# Patient Record
Sex: Male | Born: 1935 | Race: White | Hispanic: No | Marital: Married | Smoking: Former smoker
Health system: Southern US, Community
[De-identification: ages and names within clinical notes are randomized; demographics above are authoritative.]

## PROBLEM LIST (undated history)

## (undated) DIAGNOSIS — I739 Peripheral vascular disease, unspecified: Secondary | ICD-10-CM

## (undated) DIAGNOSIS — I4891 Unspecified atrial fibrillation: Secondary | ICD-10-CM

## (undated) DIAGNOSIS — E785 Hyperlipidemia, unspecified: Secondary | ICD-10-CM

## (undated) DIAGNOSIS — M1990 Unspecified osteoarthritis, unspecified site: Secondary | ICD-10-CM

## (undated) DIAGNOSIS — Z9359 Other cystostomy status: Secondary | ICD-10-CM

## (undated) DIAGNOSIS — C801 Malignant (primary) neoplasm, unspecified: Secondary | ICD-10-CM

## (undated) DIAGNOSIS — I2510 Atherosclerotic heart disease of native coronary artery without angina pectoris: Secondary | ICD-10-CM

## (undated) DIAGNOSIS — I219 Acute myocardial infarction, unspecified: Secondary | ICD-10-CM

## (undated) DIAGNOSIS — I10 Essential (primary) hypertension: Secondary | ICD-10-CM

## (undated) HISTORY — DX: Other cystostomy status: Z93.59

## (undated) HISTORY — PX: TOTAL HIP ARTHROPLASTY: SHX124

## (undated) HISTORY — PX: OTHER SURGICAL HISTORY: SHX169

## (undated) HISTORY — PX: LAMINECTOMY: SHX219

## (undated) HISTORY — PX: CORONARY ARTERY BYPASS GRAFT: SHX141

## (undated) HISTORY — DX: Atherosclerotic heart disease of native coronary artery without angina pectoris: I25.10

## (undated) HISTORY — PX: UMBILICAL HERNIA REPAIR: SHX196

## (undated) HISTORY — DX: Essential (primary) hypertension: I10

## (undated) HISTORY — PX: PROSTATECTOMY: SHX69

## (undated) HISTORY — PX: LUMBAR DISC SURGERY: SHX700

## (undated) HISTORY — DX: Hyperlipidemia, unspecified: E78.5

## (undated) HISTORY — DX: Unspecified atrial fibrillation: I48.91

---

## 1998-06-20 ENCOUNTER — Ambulatory Visit (HOSPITAL_COMMUNITY): Admission: RE | Admit: 1998-06-20 | Discharge: 1998-06-20 | Payer: Self-pay | Admitting: Family Medicine

## 2001-03-09 ENCOUNTER — Encounter: Payer: Self-pay | Admitting: Surgery

## 2001-03-09 ENCOUNTER — Encounter: Admission: RE | Admit: 2001-03-09 | Discharge: 2001-03-09 | Payer: Self-pay | Admitting: Surgery

## 2001-03-10 ENCOUNTER — Ambulatory Visit (HOSPITAL_BASED_OUTPATIENT_CLINIC_OR_DEPARTMENT_OTHER): Admission: RE | Admit: 2001-03-10 | Discharge: 2001-03-10 | Payer: Self-pay | Admitting: Surgery

## 2002-01-11 ENCOUNTER — Encounter: Payer: Self-pay | Admitting: Family Medicine

## 2002-01-11 ENCOUNTER — Encounter: Admission: RE | Admit: 2002-01-11 | Discharge: 2002-01-11 | Payer: Self-pay | Admitting: Family Medicine

## 2003-05-30 ENCOUNTER — Encounter: Admission: RE | Admit: 2003-05-30 | Discharge: 2003-05-30 | Payer: Self-pay | Admitting: Family Medicine

## 2003-06-21 ENCOUNTER — Encounter: Admission: RE | Admit: 2003-06-21 | Discharge: 2003-06-21 | Payer: Self-pay | Admitting: Family Medicine

## 2003-07-05 ENCOUNTER — Encounter: Admission: RE | Admit: 2003-07-05 | Discharge: 2003-07-05 | Payer: Self-pay | Admitting: Family Medicine

## 2003-12-15 ENCOUNTER — Encounter: Admission: RE | Admit: 2003-12-15 | Discharge: 2003-12-15 | Payer: Self-pay | Admitting: Family Medicine

## 2004-06-10 HISTORY — PX: RIGHT COLECTOMY: SHX853

## 2004-08-16 ENCOUNTER — Encounter: Admission: RE | Admit: 2004-08-16 | Discharge: 2004-08-16 | Payer: Self-pay | Admitting: Orthopaedic Surgery

## 2004-09-11 ENCOUNTER — Ambulatory Visit: Admission: RE | Admit: 2004-09-11 | Discharge: 2004-09-11 | Payer: Self-pay | Admitting: Neurosurgery

## 2004-09-12 ENCOUNTER — Ambulatory Visit: Payer: Self-pay | Admitting: *Deleted

## 2004-09-13 ENCOUNTER — Ambulatory Visit: Payer: Self-pay

## 2004-09-18 ENCOUNTER — Ambulatory Visit: Payer: Self-pay | Admitting: *Deleted

## 2004-10-05 ENCOUNTER — Inpatient Hospital Stay (HOSPITAL_COMMUNITY): Admission: RE | Admit: 2004-10-05 | Discharge: 2004-10-25 | Payer: Self-pay | Admitting: *Deleted

## 2004-10-14 ENCOUNTER — Encounter (INDEPENDENT_AMBULATORY_CARE_PROVIDER_SITE_OTHER): Payer: Self-pay | Admitting: *Deleted

## 2004-10-14 ENCOUNTER — Ambulatory Visit: Payer: Self-pay | Admitting: Internal Medicine

## 2004-11-07 ENCOUNTER — Ambulatory Visit: Payer: Self-pay | Admitting: *Deleted

## 2004-11-12 ENCOUNTER — Emergency Department (HOSPITAL_COMMUNITY): Admission: EM | Admit: 2004-11-12 | Discharge: 2004-11-12 | Payer: Self-pay | Admitting: Emergency Medicine

## 2004-11-13 ENCOUNTER — Ambulatory Visit: Payer: Self-pay | Admitting: Internal Medicine

## 2004-11-19 ENCOUNTER — Encounter (HOSPITAL_COMMUNITY): Admission: RE | Admit: 2004-11-19 | Discharge: 2005-02-17 | Payer: Self-pay | Admitting: Gastroenterology

## 2004-12-06 ENCOUNTER — Ambulatory Visit: Payer: Self-pay | Admitting: Internal Medicine

## 2004-12-17 ENCOUNTER — Observation Stay (HOSPITAL_COMMUNITY): Admission: RE | Admit: 2004-12-17 | Discharge: 2004-12-18 | Payer: Self-pay | Admitting: Urology

## 2004-12-17 ENCOUNTER — Ambulatory Visit: Payer: Self-pay | Admitting: Internal Medicine

## 2004-12-17 ENCOUNTER — Encounter (INDEPENDENT_AMBULATORY_CARE_PROVIDER_SITE_OTHER): Payer: Self-pay | Admitting: *Deleted

## 2004-12-31 ENCOUNTER — Ambulatory Visit (HOSPITAL_COMMUNITY): Admission: RE | Admit: 2004-12-31 | Discharge: 2004-12-31 | Payer: Self-pay | Admitting: Urology

## 2005-01-14 ENCOUNTER — Ambulatory Visit: Admission: RE | Admit: 2005-01-14 | Discharge: 2005-02-26 | Payer: Self-pay | Admitting: Radiation Oncology

## 2005-01-21 ENCOUNTER — Ambulatory Visit: Payer: Self-pay | Admitting: Internal Medicine

## 2005-01-22 ENCOUNTER — Ambulatory Visit: Payer: Self-pay | Admitting: Internal Medicine

## 2005-02-06 ENCOUNTER — Encounter (INDEPENDENT_AMBULATORY_CARE_PROVIDER_SITE_OTHER): Payer: Self-pay | Admitting: *Deleted

## 2005-02-06 ENCOUNTER — Ambulatory Visit: Payer: Self-pay | Admitting: Internal Medicine

## 2005-03-06 ENCOUNTER — Ambulatory Visit: Admission: RE | Admit: 2005-03-06 | Discharge: 2005-05-31 | Payer: Self-pay | Admitting: Radiation Oncology

## 2005-05-09 ENCOUNTER — Emergency Department (HOSPITAL_COMMUNITY): Admission: EM | Admit: 2005-05-09 | Discharge: 2005-05-09 | Payer: Self-pay | Admitting: Emergency Medicine

## 2005-07-03 ENCOUNTER — Emergency Department (HOSPITAL_COMMUNITY): Admission: EM | Admit: 2005-07-03 | Discharge: 2005-07-04 | Payer: Self-pay | Admitting: Emergency Medicine

## 2005-12-19 ENCOUNTER — Encounter: Admission: RE | Admit: 2005-12-19 | Discharge: 2005-12-19 | Payer: Self-pay | Admitting: Urology

## 2005-12-23 ENCOUNTER — Ambulatory Visit (HOSPITAL_COMMUNITY): Admission: AD | Admit: 2005-12-23 | Discharge: 2005-12-24 | Payer: Self-pay | Admitting: Urology

## 2006-02-02 ENCOUNTER — Emergency Department (HOSPITAL_COMMUNITY): Admission: EM | Admit: 2006-02-02 | Discharge: 2006-02-03 | Payer: Self-pay | Admitting: Emergency Medicine

## 2006-03-25 ENCOUNTER — Inpatient Hospital Stay (HOSPITAL_COMMUNITY): Admission: RE | Admit: 2006-03-25 | Discharge: 2006-03-27 | Payer: Self-pay | Admitting: Surgery

## 2006-08-04 ENCOUNTER — Encounter: Payer: Self-pay | Admitting: Emergency Medicine

## 2006-08-04 ENCOUNTER — Ambulatory Visit: Payer: Self-pay | Admitting: Cardiovascular Disease

## 2006-08-04 ENCOUNTER — Inpatient Hospital Stay (HOSPITAL_COMMUNITY): Admission: AD | Admit: 2006-08-04 | Discharge: 2006-08-10 | Payer: Self-pay | Admitting: Cardiovascular Disease

## 2006-08-05 ENCOUNTER — Encounter: Payer: Self-pay | Admitting: Vascular Surgery

## 2006-08-05 ENCOUNTER — Ambulatory Visit: Payer: Self-pay | Admitting: Thoracic Surgery (Cardiothoracic Vascular Surgery)

## 2006-08-16 ENCOUNTER — Inpatient Hospital Stay (HOSPITAL_COMMUNITY): Admission: EM | Admit: 2006-08-16 | Discharge: 2006-08-17 | Payer: Self-pay | Admitting: Emergency Medicine

## 2006-08-19 ENCOUNTER — Ambulatory Visit: Payer: Self-pay | Admitting: Cardiology

## 2006-08-25 ENCOUNTER — Ambulatory Visit: Payer: Self-pay | Admitting: Thoracic Surgery (Cardiothoracic Vascular Surgery)

## 2006-09-04 ENCOUNTER — Encounter (HOSPITAL_COMMUNITY): Admission: RE | Admit: 2006-09-04 | Discharge: 2006-12-03 | Payer: Self-pay | Admitting: Cardiology

## 2006-10-02 ENCOUNTER — Ambulatory Visit: Payer: Self-pay | Admitting: *Deleted

## 2006-10-14 ENCOUNTER — Ambulatory Visit: Payer: Self-pay | Admitting: *Deleted

## 2006-10-14 ENCOUNTER — Ambulatory Visit: Payer: Self-pay

## 2006-10-14 LAB — CONVERTED CEMR LAB
BUN: 18 mg/dL (ref 6–23)
CO2: 30 meq/L (ref 19–32)
Calcium: 9.4 mg/dL (ref 8.4–10.5)
Creatinine, Ser: 1.1 mg/dL (ref 0.4–1.5)
GFR calc Af Amer: 85 mL/min
GFR calc non Af Amer: 70 mL/min
LDL Cholesterol: 57 mg/dL (ref 0–99)
Sodium: 142 meq/L (ref 135–145)

## 2006-11-13 ENCOUNTER — Ambulatory Visit: Payer: Self-pay | Admitting: *Deleted

## 2007-02-03 ENCOUNTER — Ambulatory Visit: Payer: Self-pay | Admitting: Cardiovascular Disease

## 2007-05-22 ENCOUNTER — Ambulatory Visit: Payer: Self-pay | Admitting: Cardiovascular Disease

## 2007-10-15 ENCOUNTER — Ambulatory Visit: Payer: Self-pay

## 2007-12-21 ENCOUNTER — Ambulatory Visit: Payer: Self-pay | Admitting: Cardiovascular Disease

## 2008-01-19 ENCOUNTER — Ambulatory Visit: Payer: Self-pay

## 2008-02-25 ENCOUNTER — Encounter: Admission: RE | Admit: 2008-02-25 | Discharge: 2008-02-25 | Payer: Self-pay | Admitting: Family Medicine

## 2008-03-19 ENCOUNTER — Encounter: Admission: RE | Admit: 2008-03-19 | Discharge: 2008-03-19 | Payer: Self-pay | Admitting: "Specialist/Technologist

## 2008-03-29 ENCOUNTER — Ambulatory Visit (HOSPITAL_COMMUNITY): Admission: RE | Admit: 2008-03-29 | Discharge: 2008-03-29 | Payer: Self-pay | Admitting: Internal Medicine

## 2008-04-15 ENCOUNTER — Ambulatory Visit: Admission: RE | Admit: 2008-04-15 | Discharge: 2008-05-17 | Payer: Self-pay | Admitting: Radiation Oncology

## 2008-10-19 ENCOUNTER — Ambulatory Visit: Payer: Self-pay

## 2008-10-19 ENCOUNTER — Encounter: Payer: Self-pay | Admitting: Cardiovascular Disease

## 2008-11-02 ENCOUNTER — Telehealth: Payer: Self-pay | Admitting: Cardiovascular Disease

## 2009-03-02 ENCOUNTER — Ambulatory Visit (HOSPITAL_COMMUNITY): Admission: RE | Admit: 2009-03-02 | Discharge: 2009-03-02 | Payer: Self-pay | Admitting: Radiation Oncology

## 2009-03-08 ENCOUNTER — Ambulatory Visit (HOSPITAL_COMMUNITY): Admission: RE | Admit: 2009-03-08 | Discharge: 2009-03-08 | Payer: Self-pay | Admitting: Radiation Oncology

## 2009-05-03 DIAGNOSIS — I25810 Atherosclerosis of coronary artery bypass graft(s) without angina pectoris: Secondary | ICD-10-CM

## 2009-05-03 DIAGNOSIS — E785 Hyperlipidemia, unspecified: Secondary | ICD-10-CM

## 2009-05-03 DIAGNOSIS — E119 Type 2 diabetes mellitus without complications: Secondary | ICD-10-CM

## 2009-05-03 DIAGNOSIS — I10 Essential (primary) hypertension: Secondary | ICD-10-CM | POA: Insufficient documentation

## 2009-05-03 DIAGNOSIS — Z8546 Personal history of malignant neoplasm of prostate: Secondary | ICD-10-CM | POA: Insufficient documentation

## 2009-05-03 DIAGNOSIS — I252 Old myocardial infarction: Secondary | ICD-10-CM | POA: Insufficient documentation

## 2009-05-03 DIAGNOSIS — N289 Disorder of kidney and ureter, unspecified: Secondary | ICD-10-CM | POA: Insufficient documentation

## 2009-05-03 DIAGNOSIS — M1990 Unspecified osteoarthritis, unspecified site: Secondary | ICD-10-CM | POA: Insufficient documentation

## 2009-05-08 ENCOUNTER — Ambulatory Visit: Payer: Self-pay | Admitting: Cardiovascular Disease

## 2009-05-08 DIAGNOSIS — I4891 Unspecified atrial fibrillation: Secondary | ICD-10-CM

## 2009-05-10 ENCOUNTER — Inpatient Hospital Stay (HOSPITAL_COMMUNITY): Admission: RE | Admit: 2009-05-10 | Discharge: 2009-05-15 | Payer: Self-pay | Admitting: Orthopedic Surgery

## 2009-05-10 ENCOUNTER — Ambulatory Visit: Payer: Self-pay | Admitting: Cardiology

## 2009-05-17 ENCOUNTER — Encounter: Payer: Self-pay | Admitting: Cardiology

## 2009-05-17 LAB — CONVERTED CEMR LAB: INR: 2.69

## 2009-05-19 ENCOUNTER — Encounter: Payer: Self-pay | Admitting: Cardiology

## 2009-05-19 LAB — CONVERTED CEMR LAB: POC INR: 3.45

## 2009-05-24 ENCOUNTER — Encounter: Payer: Self-pay | Admitting: Cardiology

## 2009-05-24 LAB — CONVERTED CEMR LAB: Prothrombin Time: 27.5 s

## 2009-05-31 ENCOUNTER — Telehealth: Payer: Self-pay | Admitting: Internal Medicine

## 2009-06-01 ENCOUNTER — Encounter: Payer: Self-pay | Admitting: Cardiovascular Disease

## 2009-06-05 ENCOUNTER — Encounter: Payer: Self-pay | Admitting: Cardiology

## 2009-06-22 ENCOUNTER — Telehealth: Payer: Self-pay | Admitting: Cardiovascular Disease

## 2009-06-29 ENCOUNTER — Ambulatory Visit: Payer: Self-pay | Admitting: Cardiovascular Disease

## 2009-06-29 ENCOUNTER — Encounter: Payer: Self-pay | Admitting: Cardiovascular Disease

## 2009-06-29 ENCOUNTER — Ambulatory Visit: Payer: Self-pay | Admitting: Family Medicine

## 2009-06-29 LAB — CONVERTED CEMR LAB: POC INR: 2.2

## 2009-07-05 ENCOUNTER — Encounter: Payer: Self-pay | Admitting: Family Medicine

## 2009-07-06 ENCOUNTER — Ambulatory Visit: Payer: Self-pay | Admitting: Family Medicine

## 2009-07-06 LAB — CONVERTED CEMR LAB
AST: 11 units/L (ref 0–37)
Alkaline Phosphatase: 73 units/L (ref 39–117)
CO2: 24 meq/L (ref 19–32)
Glucose, Bld: 130 mg/dL — ABNORMAL HIGH (ref 70–99)
HDL: 38 mg/dL — ABNORMAL LOW (ref >39)
Potassium: 4.8 meq/L (ref 3.5–5.3)
Total Bilirubin: 0.4 mg/dL (ref 0.3–1.2)
Total CHOL/HDL Ratio: 4
Total Protein: 7 g/dL (ref 6.0–8.3)
Triglycerides: 190 mg/dL — ABNORMAL HIGH (ref <150)

## 2009-07-07 ENCOUNTER — Encounter: Payer: Self-pay | Admitting: Family Medicine

## 2009-07-13 ENCOUNTER — Ambulatory Visit: Payer: Self-pay | Admitting: Cardiology

## 2009-07-13 LAB — CONVERTED CEMR LAB: POC INR: 2.5

## 2009-07-26 ENCOUNTER — Encounter: Payer: Self-pay | Admitting: Family Medicine

## 2009-08-01 ENCOUNTER — Encounter: Payer: Self-pay | Admitting: Family Medicine

## 2009-08-08 ENCOUNTER — Ambulatory Visit: Payer: Self-pay | Admitting: Cardiovascular Disease

## 2009-08-10 ENCOUNTER — Encounter: Payer: Self-pay | Admitting: Family Medicine

## 2009-09-01 ENCOUNTER — Ambulatory Visit: Payer: Self-pay | Admitting: Family Medicine

## 2009-09-01 LAB — CONVERTED CEMR LAB: INR: 3

## 2009-09-15 ENCOUNTER — Ambulatory Visit: Payer: Self-pay | Admitting: Family Medicine

## 2009-09-15 LAB — CONVERTED CEMR LAB
INR: 3.4
Prothrombin Time: 22.3 s

## 2009-09-21 ENCOUNTER — Ambulatory Visit: Payer: Self-pay | Admitting: Family Medicine

## 2009-09-21 LAB — CONVERTED CEMR LAB
INR: 2.3
Prothrombin Time: 18.4 s

## 2009-10-05 ENCOUNTER — Ambulatory Visit: Payer: Self-pay | Admitting: Family Medicine

## 2009-10-05 LAB — CONVERTED CEMR LAB: INR: 1.7

## 2009-10-12 ENCOUNTER — Ambulatory Visit: Payer: Self-pay | Admitting: Family Medicine

## 2009-10-12 LAB — CONVERTED CEMR LAB: Prothrombin Time: 19.9 s

## 2009-10-26 ENCOUNTER — Ambulatory Visit: Payer: Self-pay | Admitting: Family Medicine

## 2009-11-02 ENCOUNTER — Telehealth (INDEPENDENT_AMBULATORY_CARE_PROVIDER_SITE_OTHER): Payer: Self-pay | Admitting: *Deleted

## 2009-11-07 ENCOUNTER — Encounter: Payer: Self-pay | Admitting: Cardiovascular Disease

## 2009-11-07 DIAGNOSIS — I714 Abdominal aortic aneurysm, without rupture, unspecified: Secondary | ICD-10-CM | POA: Insufficient documentation

## 2009-11-08 ENCOUNTER — Ambulatory Visit: Payer: Self-pay

## 2009-11-08 ENCOUNTER — Encounter: Payer: Self-pay | Admitting: Cardiovascular Disease

## 2009-11-15 ENCOUNTER — Telehealth: Payer: Self-pay | Admitting: Cardiovascular Disease

## 2009-11-27 ENCOUNTER — Ambulatory Visit: Payer: Self-pay | Admitting: Family Medicine

## 2009-11-27 LAB — CONVERTED CEMR LAB: INR: 2.2

## 2009-11-29 ENCOUNTER — Telehealth (INDEPENDENT_AMBULATORY_CARE_PROVIDER_SITE_OTHER): Payer: Self-pay | Admitting: *Deleted

## 2009-12-05 ENCOUNTER — Encounter: Payer: Self-pay | Admitting: Family Medicine

## 2009-12-27 ENCOUNTER — Ambulatory Visit: Payer: Self-pay | Admitting: Family Medicine

## 2010-01-10 ENCOUNTER — Ambulatory Visit: Payer: Self-pay | Admitting: Family Medicine

## 2010-01-31 ENCOUNTER — Ambulatory Visit: Payer: Self-pay | Admitting: Family Medicine

## 2010-01-31 LAB — CONVERTED CEMR LAB: INR: 2.2

## 2010-02-22 ENCOUNTER — Ambulatory Visit: Payer: Self-pay | Admitting: Family Medicine

## 2010-03-02 ENCOUNTER — Ambulatory Visit: Payer: Self-pay | Admitting: Family Medicine

## 2010-03-02 LAB — CONVERTED CEMR LAB: INR: 1.8

## 2010-03-09 ENCOUNTER — Ambulatory Visit: Payer: Self-pay | Admitting: Family Medicine

## 2010-03-09 LAB — CONVERTED CEMR LAB: INR: 1.7

## 2010-03-21 ENCOUNTER — Encounter: Payer: Self-pay | Admitting: Family Medicine

## 2010-03-21 LAB — CONVERTED CEMR LAB: INR: 2.58 — ABNORMAL HIGH (ref <1.50)

## 2010-04-03 ENCOUNTER — Ambulatory Visit: Payer: Self-pay | Admitting: Cardiovascular Disease

## 2010-04-03 ENCOUNTER — Encounter: Payer: Self-pay | Admitting: Cardiovascular Disease

## 2010-04-06 ENCOUNTER — Encounter: Payer: Self-pay | Admitting: Family Medicine

## 2010-04-19 ENCOUNTER — Ambulatory Visit: Payer: Self-pay | Admitting: Family Medicine

## 2010-05-10 ENCOUNTER — Ambulatory Visit: Payer: Self-pay | Admitting: Family Medicine

## 2010-05-16 ENCOUNTER — Ambulatory Visit: Payer: Self-pay | Admitting: Family Medicine

## 2010-05-29 ENCOUNTER — Ambulatory Visit: Payer: Self-pay | Admitting: Family Medicine

## 2010-05-29 LAB — CONVERTED CEMR LAB
Glucose, Urine, Semiquant: NEGATIVE
Specific Gravity, Urine: 1.025
Urobilinogen, UA: 1

## 2010-05-31 ENCOUNTER — Encounter: Payer: Self-pay | Admitting: Family Medicine

## 2010-06-10 HISTORY — PX: CATARACT EXTRACTION: SUR2

## 2010-06-14 ENCOUNTER — Ambulatory Visit
Admission: RE | Admit: 2010-06-14 | Discharge: 2010-06-14 | Payer: Self-pay | Source: Home / Self Care | Attending: Family Medicine | Admitting: Family Medicine

## 2010-06-14 DIAGNOSIS — G609 Hereditary and idiopathic neuropathy, unspecified: Secondary | ICD-10-CM | POA: Insufficient documentation

## 2010-06-14 LAB — CONVERTED CEMR LAB: INR: 2.3

## 2010-06-27 ENCOUNTER — Telehealth: Payer: Self-pay | Admitting: Family Medicine

## 2010-07-01 ENCOUNTER — Encounter: Payer: Self-pay | Admitting: Neurosurgery

## 2010-07-02 ENCOUNTER — Encounter: Payer: Self-pay | Admitting: Radiation Oncology

## 2010-07-10 NOTE — Progress Notes (Signed)
Summary: returning call  Phone Note Call from Patient Call back at [phone]   Caller: Patient Reason for Call: Talk to Nurse, Lab or Test Results Summary of Call: returning call Initial call taken by: Migdalia Dk,  November 15, 2009 11:04 AM  Follow-up for Phone Call        Spoke with pt's wife vasc.Aorta results  given. pt verbalized understanding.  Follow-up by: Ollen Gross, RN, BSN,  November 15, 2009 11:18 AM

## 2010-07-10 NOTE — Assessment & Plan Note (Signed)
Summary: PT/INR  Nurse Visit   Vitals Entered By: Payton Spark CMA (September 01, 2009 1:46 PM)  Allergies: 1)  ! Penicillin 2)  ! Codeine Laboratory Results   Blood Tests     PT: 21.0 s   (Normal Range: 10.6-13.4)  INR: 3.0   (Normal Range: 0.88-1.12   Therap INR: 2.0-3.5)    Orders Added: 1)  Fingerstick [36416] 2)  Protime INR [[id number]]   Anticoagulation Management History:      The patient is on coumadin and comes in today for a routine follow up visit.  The patient is taking Coumadin for chronic atrial fibrillation.  Plans are to keep the patient on coumadin for life.  His last INR was 2.40 and today's INR is 3.0.    Anticoagulation Management Assessment/Plan:      The target INR is 2.0-3.0.  He is to have a PT/INR in 3 weeks.  Anticoagulation instructions were given to patient.         Current Anticoagulation Instructions: The patient's dosage of coumadin will be decreased.  The new dosage includes:  Coumadin 3 mg tabs:  Sunday - 3 mg, Monday - 3 mg, Tuesday - 3 mg, Wednesday - 1.5 mg, Thursday - 3 mg, Friday - 3 mg, Saturday - 3 mg.    Repeat PT/INR in 3 weeks.

## 2010-07-10 NOTE — Letter (Signed)
[facility]   Imported By: Lanelle Bal 08/08/2009 13:12:20  _____________________________________________________________________  External Attachment:    Type:   Image     Comment:   External Document

## 2010-07-10 NOTE — Letter (Signed)
Summary: [facility]  [facility]   Imported By: Lanelle Bal 04/13/2010 12:14:10  _____________________________________________________________________  External Attachment:    Type:   Image     Comment:   External Document

## 2010-07-10 NOTE — Medication Information (Signed)
Summary: [name]  Anticoagulant Therapy  Managed by: Bethena Midget, RN, BSN Referring MD: Tonny Bollman PCP: Ollen Gross, MD Supervising MD: Riley Kill MD, Maisie Fus Indication 1: Prophylaxis DVT (high risk surgery) Lab Used: [facility] Site: [address] INR POC 2.2 INR RANGE 2.0-3.0  Dietary changes: no    Health status changes: no    Bleeding/hemorrhagic complications: no    Recent/future hospitalizations: no    Any changes in medication regimen? no    Recent/future dental: no  Any missed doses?: no       Is patient compliant with meds? yes       Current Medications (verified): 1)  Altace 5 Mg Caps (Ramipril) .... Take 1 Capsule By Mouth Once A Day 2)  Toprol Xl 50 Mg Xr24h-Tab (Metoprolol Succinate) .... Take One and One-Half  Tablet By Mouth Two Times A Day 3)  Lipitor 80 Mg Tabs (Atorvastatin Calcium) .... Take One Tablet By Mouth Daily. 4)  Aspirin 81 Mg Tbec (Aspirin) .... Take One Tablet By Mouth Daily On Hold 5)  Gabapentin 300 Mg Caps (Gabapentin) .... Take 1 Capsule By Mouth Three Times A Day 6)  Metformin Hcl 500 Mg Tabs (Metformin Hcl) .... Take 1 Tablet By Mouth Once A Day 7)  Prilosec Otc 20 Mg Tbec (Omeprazole Magnesium) .... Take 1 Tablet By Mouth Once A Day 8)  Hydrocodone-Acetaminophen 5-325 Mg Tabs (Hydrocodone-Acetaminophen) .... As Needed 9)  Azo-Standard 95 Mg Tabs (Phenazopyridine Hcl) .... As Needed 10)  Warfarin Sodium 3 Mg Tabs (Warfarin Sodium) .... Use As Directed By Anticoagualtion Clinic 11)  Ferrous Sulfate 325 (65 Fe) Mg Tabs (Ferrous Sulfate) .... Take Two Times A Day With Food  Allergies: 1)  ! Penicillin 2)  ! Codeine  Anticoagulation Management History:      The patient is taking warfarin and comes in today for a routine follow up visit.  Positive risk factors for bleeding include an age of 75 years or older and presence of serious comorbidities.  The bleeding index is 'intermediate risk'.  Positive CHADS2  values include History of HTN, Age > 75 years old, and History of Diabetes.  His last INR was 2.69.  Anticoagulation responsible provider: Riley Kill MD, Maisie Fus.  INR POC: 2.2.  Cuvette Lot#: [id number].  Exp: 09/2010.    Anticoagulation Management Assessment/Plan:      The patient's current anticoagulation dose is Warfarin sodium 3 mg tabs: Use as directed by Anticoagualtion Clinic.  The target INR is 2.0-3.0.  The next INR is due 07/13/2009.  Anticoagulation instructions were given to patient/spouse.  Results were reviewed/authorized by Bethena Midget, RN, BSN.  He was notified by Bethena Midget, RN, BSN.         Prior Anticoagulation Instructions: INR 2.31  [employer] spoke with [place] advised to have pt continue on same dosage 3mg  daily and recheck PT/INR in 1 week and call results to [place].   Current Anticoagulation Instructions: INR 2.2 Continue 3mg s daily. Recheck in 2 weeks.

## 2010-07-10 NOTE — Assessment & Plan Note (Signed)
Summary: Coumadin Check - jr  Nurse Visit   Allergies: 1)  ! Penicillin 2)  ! Codeine Laboratory Results   Blood Tests   Date/Time Received: 03/02/10 Date/Time Reported: 03/02/10   INR: 1.8   (Normal Range: 0.88-1.12   Therap INR: 2.0-3.5)    Orders Added: 1)  Fingerstick [36416] 2)  Protime INR [[id number]]   Anticoagulation Management History:      The patient is on coumadin and comes in today for a routine follow up visit.  He is being anticoagulated because of chronic atrial fibrillation.  Plans are to keep the patient on coumadin for life.  His last INR was 2.2 and today's INR is 1.8.    Anticoagulation Management Assessment/Plan:      The target INR is 2.0-3.0.  He is to have a PT/INR in 1 week.  Anticoagulation instructions were given to patient.         Current Anticoagulation Instructions: The patient's dosage of coumadin will be increased.  The new dosage includes: Coumadin 3 mg tabs:  Sunday - 1.5 mg, Monday - 3 mg, Tuesday - 3 mg, Wednesday - 1.5 mg, Thursday - 3 mg, Friday - 3 mg, Saturday - 3 mg.  Repeat PT/INR in 1 week.

## 2010-07-10 NOTE — Letter (Signed)
Summary: [facility]  [facility]   Imported By: Roderic Ovens 06/30/2009 11:58:59  _____________________________________________________________________  External Attachment:    Type:   Image     Comment:   External Document

## 2010-07-10 NOTE — Assessment & Plan Note (Signed)
Summary: INR  Nurse Visit   Allergies: 1)  ! Penicillin 2)  ! Codeine Laboratory Results   Blood Tests   Date/Time Received: 05/16/10 Date/Time Reported: 05/16/10   INR: 2.4   (Normal Range: 0.88-1.12   Therap INR: 2.0-3.5)    Orders Added: 1)  Fingerstick [36416] 2)  Protime INR [[id number]]   Anticoagulation Management History:      The patient is on coumadin and comes in today for a routine follow up visit.  Coumadin therapy is being given due to chronic atrial fibrillation.  Plans are to keep the patient on coumadin for life.  His last INR was 1.7 and today's INR is 2.4.    Anticoagulation Management Assessment/Plan:      The target INR is 2.0-3.0.  He is to have a PT/INR in 4 weeks.  Anticoagulation instructions were given to patient.         Current Anticoagulation Instructions: The patient is to continue with the same dose of coumadin.  This dosage includes:   Coumadin 3 mg tabs and Coumadin 1 mg tabs:  Sunday - 3 mg, Monday - 3 mg, Tuesday - 3 mg, Wednesday - 3 mg, Thursday - 4 mg, Friday - 3 mg, Saturday - 3 mg.    Repeat PT/INR in 4 weeks.

## 2010-07-10 NOTE — Assessment & Plan Note (Signed)
Summary: PT/INR  Nurse Visit   Allergies: 1)  ! Penicillin 2)  ! Codeine Laboratory Results   Blood Tests   Date/Time Received: 09/21/2009 Date/Time Reported: 09/21/2009  PT: 18.4 s   (Normal Range: 10.6-13.4)  INR: 2.3   (Normal Range: 0.88-1.12   Therap INR: 2.0-3.5)    Orders Added: 1)  Fingerstick [36416] 2)  Protime INR [[id number]]   Anticoagulation Management History:      The patient is on coumadin and comes in today for a routine follow up visit.  The patient is taking Coumadin for chronic atrial fibrillation.  Plans are to keep the patient on coumadin for life.  His last INR was 3.4 and today's INR is 2.3.    Anticoagulation Management Assessment/Plan:      The target INR is 2.0-3.0.  He is to have a PT/INR in 2 weeks.  Anticoagulation instructions were given to patient.         Current Anticoagulation Instructions: The patient is to continue with the same dose of coumadin.  This dosage includes: Coumadin 3 mg tabs:  Sunday - 3 mg, Monday - 3 mg, Tuesday - 3 mg, Wednesday - 1.5 mg, Thursday - 3 mg, Friday - 1.5 mg, Saturday - 3 mg.  Repeat PT/INR in 2 weeks.

## 2010-07-10 NOTE — Assessment & Plan Note (Signed)
Summary: Coumidan check - jr  Nurse Visit   Allergies: 1)  ! Penicillin 2)  ! Codeine Laboratory Results   Blood Tests   Date/Time Received: 12/27/2009 Date/Time Reported: 12/27/2009   INR: 3.1   (Normal Range: 0.88-1.12   Therap INR: 2.0-3.5)    Orders Added: 1)  Fingerstick [36416] 2)  Protime INR [[id number]]   Anticoagulation Management History:      The patient is on coumadin and comes in today for a routine follow up visit.  The patient is on Coumadin for chronic atrial fibrillation.  Plans are to keep the patient on coumadin for life.  His last INR was 2.2 and today's INR is 3.1.    Anticoagulation Management Assessment/Plan:      The target INR is 2.0-3.0.  He is to have a PT/INR in 2 weeks.  Anticoagulation instructions were given to patient.         Current Anticoagulation Instructions: The patient's dosage of coumadin will be decreased.  The new dosage includes: Coumadin 3 mg tabs:  Sunday - 3 mg, Monday - 3 mg, Tuesday - 3 mg, Wednesday - 1.5 mg, Thursday - 3 mg, Friday - 3 mg, Saturday - 3 mg.    Repeat PT/INR in 2 weeks.

## 2010-07-10 NOTE — Assessment & Plan Note (Signed)
Summary: INR check - jr  Nurse Visit   Vitals Entered By: Payton Spark CMA (Oct 12, 2009 1:15 PM)  Allergies: 1)  ! Penicillin 2)  ! Codeine Laboratory Results   Blood Tests     PT: 19.9 s   (Normal Range: 10.6-13.4)  INR: 2.7   (Normal Range: 0.88-1.12   Therap INR: 2.0-3.5)    Orders Added: 1)  Fingerstick [36416] 2)  Protime INR [[id number]]   Anticoagulation Management History:      The patient is on coumadin and comes in today for a routine follow up visit.  The patient is taking Coumadin for chronic atrial fibrillation.  Plans are to keep the patient on coumadin for life.  His last INR was 1.7 and today's INR is 2.7.    Anticoagulation Management Assessment/Plan:      The target INR is 2.0-3.0.  He is to have a PT/INR in 2 weeks.  Anticoagulation instructions were given to patient.         Current Anticoagulation Instructions: The patient is to continue with the same dose of coumadin.  This dosage includes: Coumadin 3 mg tabs:  Sunday - 3 mg, Monday - 3 mg, Tuesday - 3 mg, Wednesday - 1.5 mg, Thursday - 3 mg, Friday - 3 mg, Saturday - 3 mg.  Repeat PT/INR in 2 weeks.

## 2010-07-10 NOTE — Assessment & Plan Note (Signed)
Summary: NOV: DM   Vital Signs:  Patient profile:   77 year old male Height:      66.5 inches Weight:      167 pounds Pulse rate:   103 / minute BP sitting:   113 / 60  (left arm) Cuff size:   regular  Vitals Entered By: Kathlene November (June 29, 2009 2:42 PM) CC: NP- establish care Is Patient Diabetic? Yes Did you bring your meter with you today? No  Vision Screening:      Vision Comments: 03/2010   Primary Care Provider:  Ollen Gross, MD  CC:  NP- establish care.  History of Present Illness: hx of paroxysmal afib. He is aymptomatic with this. Had   Diabetes Management History:      The patient is a 77 years old male who comes in for evaluation of DM Type 2.  He states understanding of dietary principles but he is not following the appropriate diet.  He is checking home blood sugars.        Hypoglycemic symptoms are not occurring.  No hyperglycemic symptoms are reported.        His home blood sugars include fasting blood sugars: highest: 125, lowest: 90.    Habits & Providers  Alcohol-Tobacco-Diet     Alcohol drinks/day: 0     Tobacco Status: quit     Year Quit: 60 years ago  Exercise-Depression-Behavior     Have you felt down or hopeless? no     STD Risk: never     Drug Use: never     Seat Belt Use: always  Current Medications (verified): 1)  Altace 5 Mg Caps (Ramipril) .... Take 1 Capsule By Mouth Once A Day 2)  Toprol Xl 50 Mg Xr24h-Tab (Metoprolol Succinate) .... Take One and One-Half  Tablet By Mouth Two Times A Day 3)  Lipitor 80 Mg Tabs (Atorvastatin Calcium) .... Take One Tablet By Mouth Daily. 4)  Aspirin 81 Mg Tbec (Aspirin) .... Take One Tablet By Mouth Daily On Hold 5)  Gabapentin 300 Mg Caps (Gabapentin) .... Take 1 Capsule By Mouth Three Times A Day 6)  Metformin Hcl 500 Mg Tabs (Metformin Hcl) .... Take 1 Tablet By Mouth Once A Day 7)  Prilosec Otc 20 Mg Tbec (Omeprazole Magnesium) .... Take 1 Tablet By Mouth Once A Day 8)   Hydrocodone-Acetaminophen 5-325 Mg Tabs (Hydrocodone-Acetaminophen) .... As Needed 9)  Azo-Standard 95 Mg Tabs (Phenazopyridine Hcl) .... As Needed 10)  Warfarin Sodium 3 Mg Tabs (Warfarin Sodium) .... Use As Directed By Anticoagualtion Clinic 11)  Ferrous Sulfate 325 (65 Fe) Mg Tabs (Ferrous Sulfate) .... Take Two Times A Day With Food 12)  Digoxin 0.125 Mg Tabs (Digoxin) .... Take One Tablet By Mouth Daily  Allergies (verified): 1)  ! Penicillin 2)  ! Codeine  Comments:  Nurse/Medical Assistant: The patient's medications and allergies were reviewed with the patient and were updated in the Medication and Allergy Lists. Kathlene November (June 29, 2009 2:45 PM)  Past History:  Past Medical History: Last updated: 05/03/2009 Current Problems:  CAD, ARTERY BYPASS GRAFT (ICD-414.04) HYPERTENSION, UNSPECIFIED (ICD-401.9) DYSLIPIDEMIA (ICD-272.4) DIABETES MELLITUS, TYPE II (ICD-250.00) MYOCARDIAL INFARCTION, HX OF (ICD-412) KIDNEY DISEASE (ICD-593.9) PROSTATE CANCER, HX OF (ICD-V10.46) DEGENERATIVE JOINT DISEASE (ICD-715.90) Postoperative Atrial Fib.  Past Surgical History:  1. Prostatectomy with radiation therapy.  2. Suprapubic bladder catheter.  3. Lumbar laminectomy and diskectomy (L4-L5) May 2006.  4. Exploratory laparotomy with right colectomy for ischemic bowel  secondary to ileus with ischemic colitis following lumbar      laminectomy, diskectomy May 2006.  5. Repair of ventral incisional hernia October 2007.  6. Coronary artery bypass surgery, 4 vessel.   7. Right hip replacement  05/10/2010  Family History:   Notable for siblings with coronary artery disease. Father wiht MI Sister with DM Cholesterol runs the family.    Social History:   The patient is married and lives with his wife in  [place]. 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 The patient has remote history of tobacco use, although he quit smoking 40 years ago. The patient denies significant alcohol consumption. HS degree.   Smoking Status:  quit STD Risk:  never Drug Use:  never Seat Belt Use:  always  Review of Systems       + fever/sweats/weakness, unexplained weight loss/gain.  No vison changes.  No difficulty hearing/ringing in ears, hay fever/allergies.  No chest pain/discomfort, palpitations.  No Br lump/nipple discharge.  No cough/wheeze.  No blood in BM, nausea/vomiting/diarrhea.  No nighttime urination, leaking urine, unusual vaginal bleeding, discharge (penis or vagina).  No muscle/joint pain. No rash, change in mole.  No HA, memory loss.  No anxiety, + sleep d/o, no depression.  + easy bruising/bleeding, no unexplained lump   Physical Exam  General:  Well-developed,well-nourished,in no acute distress; alert,appropriate and cooperative throughout examination Head:  Normocephalic and atraumatic without obvious abnormalities. No apparent alopecia or balding. Lungs:  Normal respiratory effort, chest expands symmetrically. Lungs are clear to auscultation, no crackles or wheezes. Heart:  Normal rate and regular rhythm. S1 and S2 normal without gallop, murmur, click, rub or other extra sounds.  No carotid bruits.  Pulses:  Radial 2+  Extremities:  No LE edema  Skin:  no rashes.   Cervical Nodes:  No lymphadenopathy noted Psych:  Cognition and judgment appear intact. Alert and cooperative with normal attention span and concentration. No apparent delusions, illusions, hallucinations  Diabetes Management Exam:    Eye Exam:       Eye Exam done elsewhere          Date: 03/10/2009          Results: normal          Done by: [facility]   Impression & Recommendations:  Problem # 1:  DIABETES MELLITUS, TYPE II (ICD-250.00) He is really well controlled. I wonder once he has recovered form his acute illnesses if he  may be more of a diet controlled diabetic and be able to come off the metformin. FU in one month with his glucose log.  will look in hosptial records to see if has had recent cholesterol. If not will need to get at fu visit in one month. He eye exam is up to date.  His updated medication list for this problem includes:    Altace 5 Mg Caps (Ramipril) .Marland Kitchen... Take 1 capsule by mouth once a day    Aspirin 81 Mg Tbec (Aspirin) .Marland Kitchen... Take one tablet by mouth daily on hold    Metformin Hcl 500 Mg Tabs (Metformin hcl) .Marland Kitchen... Take 1 tablet by mouth once a day  Orders: Fingerstick (36416) Hemoglobin A1C (83036)  Complete Medication List: 1)  Altace 5 Mg Caps (Ramipril) .... Take 1 capsule by mouth once a day 2)  Toprol Xl 50 Mg Xr24h-tab (Metoprolol succinate) .... Take one and one-half  tablet by mouth two times a day 3)  Lipitor 80 Mg Tabs (Atorvastatin calcium) .... Take one tablet by mouth daily. 4)  Aspirin 81 Mg Tbec (Aspirin) .... Take one tablet by mouth daily on hold 5)  Gabapentin 300 Mg Caps (Gabapentin) .... Take 1 capsule by mouth three times a day 6)  Metformin Hcl 500 Mg Tabs (Metformin hcl) .... Take 1 tablet by mouth once a day 7)  Prilosec Otc 20 Mg Tbec (Omeprazole magnesium) .... Take 1 tablet by mouth once a day 8)  Hydrocodone-acetaminophen 5-325 Mg Tabs (Hydrocodone-acetaminophen) .... As needed 9)  Azo-standard 95 Mg Tabs (Phenazopyridine hcl) .... As needed 10)  Warfarin Sodium 3 Mg Tabs (Warfarin sodium) .... Use as directed by anticoagualtion clinic 11)  Ferrous Sulfate 325 (65 Fe) Mg Tabs (Ferrous sulfate) .... Take two times a day with food 12)  Digoxin 0.125 Mg Tabs (Digoxin) .... Take one tablet by mouth daily 13)  Glucometer.  .... Dx. 250.00.   strips and lancet to test up to twice a day. Prescriptions: GLUCOMETER. Dx. 250.00.   Strips and lancet to test up to twice a day.  #30 day x PRN   Entered and Authorized by:   [name] MD   Signed by:   [name] MD on 06/29/2009   Method used:   Printed then faxed to ...       [facility] [address] [phone]* (retail)       [address] [place], [place]  [address]       Ph: [phone]       Fax: [phone]   RxID:   [id number]     Flu Vaccine Result Date:  03/10/2009 Flu Vaccine Result:  given Flu Vaccine Next Due:  1 yr TD Next Due:  Not Indicated Pneumovax Result Date:  06/11/2007 Pneumovax Result:  given Pneumovax Next Due:  Not Indicated  Laboratory Results   Blood Tests   Date/Time Received: 06/29/2009 Date/Time Reported: 06/29/2009  HGBA1C: 5.1%   (Normal Range: Non-Diabetic - 3-6%   Control Diabetic - 6-8%)        Appended Document: NOV: DM Call pt: Looked at labs at the hospital. Need a repeat CMP to check on hsi sodium and need a fasting cholesterol panel.  January 24, 201112:45 PM Tevyn Codd MD, Santina Evans 07/03/2009 @ 12:49pm-Pt notifeid of MD instructions. KJ LPN

## 2010-07-10 NOTE — Assessment & Plan Note (Signed)
Summary: [name]   Visit Type:  3 months follow up Primary Provider:  Ollen Gross, MD  CC:  No cardiac complaints.  History of Present Illness: 77 year-old male with NSTEMI in 2008 who underwent CABG for treatment of severe multivessel CAD. The patient presents today for follow-up evaluation. He is doing well without cardiac complaints. The patient denies chest pain, dyspnea, orthopnea, PND, edema, palpitations, lightheadedness, or syncope.      Current Medications (verified): 1)  Altace 5 Mg Caps (Ramipril) .... Take 1 Capsule By Mouth Once A Day 2)  Toprol Xl 50 Mg Xr24h-Tab (Metoprolol Succinate) .... Take 2 Tablets Two Times A Day 3)  Lipitor 80 Mg Tabs (Atorvastatin Calcium) .... Take One Tablet By Mouth Daily. 4)  Gabapentin 300 Mg Caps (Gabapentin) .... Take 1 Capsule By Mouth Three Times A Day 5)  Metformin Hcl 500 Mg Tabs (Metformin Hcl) .... Take 1 Tablet By Mouth Once A Day 6)  Prilosec Otc 20 Mg Tbec (Omeprazole Magnesium) .... Take 1 Tablet By Mouth Once A Day 7)  Hydrocodone-Acetaminophen 5-325 Mg Tabs (Hydrocodone-Acetaminophen) .... As Needed 8)  Azo-Standard 95 Mg Tabs (Phenazopyridine Hcl) .... As Needed 9)  Warfarin Sodium 3 Mg Tabs (Warfarin Sodium) .... Use As Directed By Anticoagualtion Clinic 10)  Ferrous Sulfate 325 (65 Fe) Mg Tabs (Ferrous Sulfate) .... Take Two Times A Day With Food 11)  Digoxin 0.125 Mg Tabs (Digoxin) .... Take One Tablet By Mouth Daily 12)  Glucometer. .... Dx. 250.00.   Strips and Lancet To Test Up To Twice A Day. 13)  Fish Oil 1200 Mg Caps (Omega-3 Fatty Acids) .... Take 1 Capsule By Mouth Two Times A Day  Allergies: 1)  ! Penicillin 2)  ! Codeine  Past History:  Past medical history reviewed for relevance to current acute and chronic problems.  Past Medical History: Suprapubic cath Postoperative Atrial Fib. CAD s/p CABG 2008  Review of Systems       Negative except as per HPI   Vital Signs:  Patient profile:   77 year old  male Height:      66.5 inches Weight:      170 pounds BMI:     27.13 Pulse rate:   60 / minute Pulse rhythm:   irregular Resp:     18 per minute BP sitting:   132 / 84  (left arm) Cuff size:   large  Vitals Entered By: Vikki Ports (August 08, 2009 1:52 PM)  Physical Exam  General:  Pt is alert and oriented, elderly, very pleasant male, in no acute distress. HEENT: normal Neck: normal carotid upstrokes without bruits, JVP normal Lungs: CTA CV: RRR without murmur or gallop Abd: soft, NT, positive BS, no bruit, no organomegaly Ext: no clubbing, cyanosis, or edema. peripheral pulses 2+ and equal Skin: warm and dry without rash    Impression & Recommendations:  Problem # 1:  CAD, ARTERY BYPASS GRAFT (ICD-414.04) Stable without angina. Continue current medical therapy.  The following medications were removed from the medication list:    Aspirin 81 Mg Tbec (Aspirin) .Marland Kitchen... Take one tablet by mouth daily on hold His updated medication list for this problem includes:    Altace 5 Mg Caps (Ramipril) .Marland Kitchen... Take 1 capsule by mouth once a day    Toprol Xl 50 Mg Xr24h-tab (Metoprolol succinate) .Marland Kitchen... Take 2 tablets two times a day    Coumadin 3 Mg Tabs (Warfarin sodium) ..... Sunday - 3 mg, monday - 3 mg, tuesday -  3 mg, wednesday - 3 mg, thursday - 3 mg, friday - 3 mg, saturday - 3 mg  Problem # 2:  ATRIAL FIBRILLATION (ICD-427.31) Rate well-controlled. Continue anticoagulation with coumadin and rate-control with Toprol/Dig.  The following medications were removed from the medication list:    Aspirin 81 Mg Tbec (Aspirin) .Marland Kitchen... Take one tablet by mouth daily on hold His updated medication list for this problem includes:    Toprol Xl 50 Mg Xr24h-tab (Metoprolol succinate) .Marland Kitchen... Take 2 tablets two times a day    Digoxin 0.125 Mg Tabs (Digoxin) .Marland Kitchen... Take one tablet by mouth daily    Coumadin 3 Mg Tabs (Warfarin sodium) ..... Sunday - 3 mg, monday - 3 mg, tuesday - 3 mg, wednesday - 3 mg,  thursday - 3 mg, friday - 3 mg, saturday - 3 mg  Orders: T-PT (Prothrombin Time) (60454)  Problem # 3:  HYPERTENSION, UNSPECIFIED (ICD-401.9) Controlled.  The following medications were removed from the medication list:    Aspirin 81 Mg Tbec (Aspirin) .Marland Kitchen... Take one tablet by mouth daily on hold His updated medication list for this problem includes:    Altace 5 Mg Caps (Ramipril) .Marland Kitchen... Take 1 capsule by mouth once a day    Toprol Xl 50 Mg Xr24h-tab (Metoprolol succinate) .Marland Kitchen... Take 2 tablets two times a day  BP today: 132/84 Prior BP: 121/68 (07/06/2009)  Labs Reviewed: K+: 4.8 (07/05/2009) Creat: : 0.96 (07/05/2009)   Chol: 152 (07/05/2009)   HDL: 38 (07/05/2009)   LDL: 76 (07/05/2009)   TG: 190 (07/05/2009)  Problem # 4:  DYSLIPIDEMIA (ICD-272.4) Followed by PCP. Lipids at goal.  His updated medication list for this problem includes:    Lipitor 80 Mg Tabs (Atorvastatin calcium) .Marland Kitchen... Take one tablet by mouth daily.  CHOL: 152 (07/05/2009)   LDL: 76 (07/05/2009)   HDL: 38 (07/05/2009)   TG: 190 (07/05/2009)  Patient Instructions: 1)  Your physician recommends that you continue on your current medications as directed. Please refer to the Current Medication list given to you today. 2)  Your physician wants you to follow-up in:   6 MONTHS. You will receive a reminder letter in the mail two months in advance. If you don't receive a letter, please call our office to schedule the follow-up appointment.

## 2010-07-10 NOTE — Medication Information (Signed)
Summary: rov/tm  Anticoagulant Therapy  Managed by: Inactive Referring MD: Tonny Bollman PCP: Ollen Gross, MD Supervising MD: Juanda Chance MD, Teegan Guinther Indication 1: Prophylaxis DVT (high risk surgery) Lab Used: [facility] [place] Site: [address] INR POC 2.5 INR RANGE 2.0-3.0  Dietary changes: no    Health status changes: no    Bleeding/hemorrhagic complications: no    Recent/future hospitalizations: no    Any changes in medication regimen? no    Recent/future dental: no  Any missed doses?: no       Is patient compliant with meds? yes       Allergies: 1)  ! Penicillin 2)  ! Codeine  Anticoagulation Management History:      The patient is taking warfarin and comes in today for a routine follow up visit.  Positive risk factors for bleeding include an age of 21 years or older and presence of serious comorbidities.  The bleeding index is 'intermediate risk'.  Positive CHADS2 values include History of HTN, Age > 41 years old, and History of Diabetes.  His last INR was 2.69.  Anticoagulation responsible provider: Juanda Chance MD, Smitty Cords.  INR POC: 2.5.  Cuvette Lot#: [id number].  Exp: 09/2010.    Anticoagulation Management Assessment/Plan:      The patient's current anticoagulation dose is Warfarin sodium 3 mg tabs: Use as directed by Anticoagualtion Clinic.  The target INR is 2.0-3.0.  The next INR is due 08/10/2009.  Anticoagulation instructions were given to patient/spouse.  Results were reviewed/authorized by Inactive.  He was notified by Bethena Midget, RN, BSN.         Prior Anticoagulation Instructions: INR 2.2 Continue 3mg s daily. Recheck in 2 weeks.   Current Anticoagulation Instructions: INR 2.5 Continue 3mg s everyday. Recheck in 4 weeks at [facility] office wtih Dr. [name], MD appt scheduled and Erie Noe at that office did say that they would takeover INR checks and dosing.

## 2010-07-10 NOTE — Letter (Signed)
Summary: [facility]  [facility]   Imported By: Lanelle Bal 12/14/2009 13:59:59  _____________________________________________________________________  External Attachment:    Type:   Image     Comment:   External Document

## 2010-07-10 NOTE — Assessment & Plan Note (Signed)
Summary: Dysuria   Vital Signs:  Patient profile:   77 year old male Height:      66.5 inches Weight:      166 pounds Temp:     98.6 degrees F oral Pulse rate:   95 / minute BP sitting:   121 / 68  (left arm) Cuff size:   regular  Vitals Entered By: Kathlene November (July 06, 2009 1:00 PM) CC: burning with urination   Primary Care Provider:  Ollen Gross, MD  CC:  burning with urination.  History of Present Illness: burning with urination. Started about 1 week ago. WAs taking AZO but not improving his sxs.  No fever or back pain.     Current Medications (verified): 1)  Altace 5 Mg Caps (Ramipril) .... Take 1 Capsule By Mouth Once A Day 2)  Toprol Xl 50 Mg Xr24h-Tab (Metoprolol Succinate) .... Take One and One-Half  Tablet By Mouth Two Times A Day 3)  Lipitor 80 Mg Tabs (Atorvastatin Calcium) .... Take One Tablet By Mouth Daily. 4)  Aspirin 81 Mg Tbec (Aspirin) .... Take One Tablet By Mouth Daily On Hold 5)  Gabapentin 300 Mg Caps (Gabapentin) .... Take 1 Capsule By Mouth Three Times A Day 6)  Metformin Hcl 500 Mg Tabs (Metformin Hcl) .... Take 1 Tablet By Mouth Once A Day 7)  Prilosec Otc 20 Mg Tbec (Omeprazole Magnesium) .... Take 1 Tablet By Mouth Once A Day 8)  Hydrocodone-Acetaminophen 5-325 Mg Tabs (Hydrocodone-Acetaminophen) .... As Needed 9)  Azo-Standard 95 Mg Tabs (Phenazopyridine Hcl) .... As Needed 10)  Warfarin Sodium 3 Mg Tabs (Warfarin Sodium) .... Use As Directed By Anticoagualtion Clinic 11)  Ferrous Sulfate 325 (65 Fe) Mg Tabs (Ferrous Sulfate) .... Take Two Times A Day With Food 12)  Digoxin 0.125 Mg Tabs (Digoxin) .... Take One Tablet By Mouth Daily 13)  Glucometer. .... Dx. 250.00.   Strips and Lancet To Test Up To Twice A Day. 14)  Fish Oil 1000 Mg Caps (Omega-3 Fatty Acids) .... 2 Caps By Mouth Daily  Allergies (verified): 1)  ! Penicillin 2)  ! Codeine  Comments:  Nurse/Medical Assistant: The patient's medications and allergies were reviewed with  the patient and were updated in the Medication and Allergy Lists. Kathlene November (July 06, 2009 1:00 PM)  Past History:  Past Surgical History: Last updated: 06/29/2009  1. Prostatectomy with radiation therapy.  2. Suprapubic bladder catheter.  3. Lumbar laminectomy and diskectomy (L4-L5) May 2006.  4. Exploratory laparotomy with right colectomy for ischemic bowel      secondary to ileus with ischemic colitis following lumbar      laminectomy, diskectomy May 2006.  5. Repair of ventral incisional hernia October 2007.  6. Coronary artery bypass surgery, 4 vessel.   7. Right hip replacement  05/10/2010  Past Medical History: Suprapubic cath Postoperative Atrial Fib.   Impression & Recommendations:  Problem # 1:  DYSURIA (ICD-788.1) Likely UTI with his history will treat with CIpro. If not improving in 3-4 days then will change to levaquin. Will send a culture though likely contaminated since sample from his bag. I do feel comfortabe doing an in and out cath with a suprapubic cath.   His updated medication list for this problem includes:    Azo-standard 95 Mg Tabs (Phenazopyridine hcl) .Marland Kitchen... As needed    Cipro 500 Mg Tabs (Ciprofloxacin hcl) .Marland Kitchen... Take 1 tablet by mouth two times a day for 14 days  Orders: T-Urine Culture (Spectrum Order) [phone])  Complete Medication List: 1)  Altace 5 Mg Caps (Ramipril) .... Take 1 capsule by mouth once a day 2)  Toprol Xl 50 Mg Xr24h-tab (Metoprolol succinate) .... Take one and one-half  tablet by mouth two times a day 3)  Lipitor 80 Mg Tabs (Atorvastatin calcium) .... Take one tablet by mouth daily. 4)  Aspirin 81 Mg Tbec (Aspirin) .... Take one tablet by mouth daily on hold 5)  Gabapentin 300 Mg Caps (Gabapentin) .... Take 1 capsule by mouth three times a day 6)  Metformin Hcl 500 Mg Tabs (Metformin hcl) .... Take 1 tablet by mouth once a day 7)  Prilosec Otc 20 Mg Tbec (Omeprazole magnesium) .... Take 1 tablet by mouth once a day 8)   Hydrocodone-acetaminophen 5-325 Mg Tabs (Hydrocodone-acetaminophen) .... As needed 9)  Azo-standard 95 Mg Tabs (Phenazopyridine hcl) .... As needed 10)  Warfarin Sodium 3 Mg Tabs (Warfarin sodium) .... Use as directed by anticoagualtion clinic 11)  Ferrous Sulfate 325 (65 Fe) Mg Tabs (Ferrous sulfate) .... Take two times a day with food 12)  Digoxin 0.125 Mg Tabs (Digoxin) .... Take one tablet by mouth daily 13)  Glucometer.  .... Dx. 250.00.   strips and lancet to test up to twice a day. 14)  Fish Oil 1000 Mg Caps (Omega-3 fatty acids) .... 2 caps by mouth daily 15)  Cipro 500 Mg Tabs (Ciprofloxacin hcl) .... Take 1 tablet by mouth two times a day for 14 days Prescriptions: CIPRO 500 MG TABS (CIPROFLOXACIN HCL) Take 1 tablet by mouth two times a day for 14 days  #28 x 0   Entered and Authorized by:   [name] MD   Signed by:   [name] MD on 07/06/2009   Method used:   Electronically to        [facility] [phone]* (retail)       [address] [place], [place]  [address]       Ph: [phone]       Fax: [phone]   RxID:   [id number]

## 2010-07-10 NOTE — Assessment & Plan Note (Signed)
Summary: Coumadin check-jr  Nurse Visit   Allergies: 1)  ! Penicillin 2)  ! Codeine Laboratory Results   Blood Tests   Date/Time Received: 01/31/10 Date/Time Reported: 01/31/10   INR: 2.2   (Normal Range: 0.88-1.12   Therap INR: 2.0-3.5)    Orders Added: 1)  Protime [[id number]] 2)  Fingerstick [[address]]   Anticoagulation Management History:      The patient is on coumadin and comes in today for a routine follow up visit.  He is being anticoagulated due to chronic atrial fibrillation.  Plans are to keep the patient on coumadin for life.  His last INR was 3.4 and today's INR is 2.2.    Anticoagulation Management Assessment/Plan:      The target INR is 2.0-3.0.  He is to have a PT/INR in 4 weeks.  Anticoagulation instructions were given to patient.         Current Anticoagulation Instructions: The patient is to continue with the same dose of coumadin.  This dosage includes:   Coumadin 3 mg tabs:  Sunday - 1.5 mg, Monday - 3 mg, Tuesday - 3 mg, Wednesday - 1.5 mg, Thursday - 3 mg, Friday - 1.5 mg, Saturday - 3 mg.    Repeat PT/INR in 4 weeks.

## 2010-07-10 NOTE — Assessment & Plan Note (Signed)
Summary: f7m   Visit Type:  6 months follow up Primary Provider:  Seymour Bars  CC:  Legs edema.  History of Present Illness: 77 year-old [name] with NSTEMI in 2008 who underwent CABG for treatment of severe multivessel CAD. The patient presents today for follow-up evaluation. He has had no further ischemic events since CABG, but has developed paroxysmal atrial fibrillation.  He is doing well without cardiac complaints. The patient denies chest pain, dyspnea, orthopnea, PND, edema, palpitations, lightheadedness, or syncope.      Current Medications (verified): 1)  Altace 5 Mg Caps (Ramipril) .... Take 1 Capsule By Mouth Once A Day 2)  Toprol Xl 50 Mg Xr24h-Tab (Metoprolol Succinate) .... Take 2 Tablets Two Times A Day 3)  Lipitor 80 Mg Tabs (Atorvastatin Calcium) .... Take One Tablet By Mouth Daily. 4)  Gabapentin 300 Mg Caps (Gabapentin) .... Take 1 Capsule By Mouth Three Times A Day 5)  Metformin Hcl 500 Mg Tabs (Metformin Hcl) .... Take 1 Tablet By Mouth Once A Day 6)  Prilosec Otc 20 Mg Tbec (Omeprazole Magnesium) .... Take 1 Tablet By Mouth Once A Day 7)  Digoxin 0.125 Mg Tabs (Digoxin) .... Take One Tablet By Mouth Daily 8)  Glucometer. .... Dx. 250.00.   Strips and Lancet To Test Up To Twice A Day. 9)  Fish Oil 1200 Mg Caps (Omega-3 Fatty Acids) .... Take 1 Capsule By Mouth Two Times A Day 10)  Coumadin 3 Mg Tabs (Warfarin Sodium) .... Sunday - 3 Mg, Monday - 3 Mg, Tuesday - 3 Mg, Wednesday - 3 Mg, Thursday - 3 Mg, Friday - 3 Mg, Saturday - 3 Mg  Allergies: 1)  ! Penicillin 2)  ! Codeine  Past History:  Past medical history reviewed for relevance to current acute and chronic problems.  Past Medical History: Suprapubic cath Postoperative Atrial Fib. CAD s/p CABG 2008 Dyslipidemia Type 2 DM HTN, essential  Family History:   Notable for siblings with coronary artery disease. Father with MI Sister with DM Cholesterol runs the family.    Review of Systems   Positive for leg swelling, otherwise negative except as per HPI.  Vital Signs:  Patient profile:   77 year old [name] Height:      66.5 inches Weight:      162.75 pounds BMI:     25.97 Pulse rate:   60 / minute Pulse rhythm:   regular Resp:     18  per minute BP sitting:   160 / 80  (left arm) Cuff size:   regular  Vitals Entered By: Vikki Ports (April 03, 2010 4:08 PM)  Physical Exam  General:  Pt is alert and oriented, elderly, very pleasant [name], in no acute distress. HEENT: normal Neck: normal carotid upstrokes without bruits, JVP normal Lungs: CTA CV: irregularly irregular without murmur or gallop Abd: soft, NT, positive BS, no bruit, no organomegaly Ext: 1+ right leg edema, none on left.  peripheral pulses 2+ and equal Skin: warm and dry without rash    EKG  Procedure date:  04/03/2010  Findings:      Atrial fibrillation 60 bpm, nonspecific IVCD, LAD  Impression & Recommendations:  Problem # 1:  CAD, ARTERY BYPASS GRAFT (ICD-414.04) Stable without angina at present. Continue secondary risk reduction measures as outlined.  His updated medication list for this problem includes:    Ramipril 10 Mg Caps (Ramipril) .Marland Kitchen... Take one capsule by mouth daily    Toprol Xl 50 Mg Xr24h-tab (Metoprolol succinate) .Marland Kitchen... Take  2 tablets two times a day    Coumadin 3 Mg Tabs (Warfarin sodium) ..... "Sunday - 3 mg, monday - 3 mg, tuesday - 3 mg, wednesday - 3 mg, thursday - 3 mg, friday - 3 mg, saturday - 3 mg  Orders: EKG w/ Interpretation (93000)  Problem # 2:  ATRIAL FIBRILLATION (ICD-427.31) continue with rate-control/anticoagulation strategy  His updated medication list for this problem includes:    Toprol Xl 50 Mg Xr24h-tab (Metoprolol succinate) ..... Take 2 tablets two times a day    Digoxin 0.125 Mg Tabs (Digoxin) ..... Take one tablet by mouth daily    Coumadin 3 Mg Tabs (Warfarin sodium) ..... Sunday - 3 mg, monday - 3 mg, tuesday - 3 mg, wednesday - 3 mg, thursday  - 3 mg, friday - 3 mg, saturday - 3 mg  Orders: EKG w/ Interpretation (93000)  Problem # 3:  HYPERTENSION, UNSPECIFIED (ICD-401.9) Increase ramipril in setting elevated BP  His updated medication list for this problem includes:    Ramipril 10 Mg Caps (Ramipril) ..... Take one capsule by mouth daily    Toprol Xl 50 Mg Xr24h-tab (Metoprolol succinate) ..... Take 2 tablets two times a day  BP today: 160/80 Prior BP: 132/84 (08/08/2009)  Labs Reviewed: K+: 4.8 (07/05/2009) Creat: : 0.96 (07/05/2009)   Chol: 152 (07/05/2009)   HDL: 38 (07/05/2009)   LDL: Sean (07/05/2009)   TG: 190 (07/05/2009)  Problem # 4:  DYSLIPIDEMIA (ICD-272.4) Lipids at goal as below.  His updated medication list for this problem includes:    Lipitor 80 Mg Tabs (Atorvastatin calcium) ..... Take one tablet by mouth daily.  CHOL: 152 (07/05/2009)   LDL: Sean (07/05/2009)   HDL: 38 (07/05/2009)   TG: 190 (07/05/2009)  Patient Instructions: 1)  Your physician has recommended you make the following change in your medication: INCREASE Altace to 10mg once a day 2)  Your physician wants you to follow-up in:  6 MONTHS. You will receive a reminder letter in the mail two months in advance. If you don't receive a letter, please call our office to schedule the follow-up appointment. Prescriptions: RAMIPRIL 10 MG CAPS (RAMIPRIL) Take one capsule by mouth daily  #90 x 3   Entered by:   Lauren Brown, RN, BSN   Authorized by:   Michael David Cooper, MD   Signed by:   Lauren Brown, RN, BSN on 04/03/2010   Method used:   Electronically to        [facility] [address] [id number]* (retail)       [address] [place], [place]  [address]       Ph: [phone]       Fax: [phone]   RxID:   [id number]

## 2010-07-10 NOTE — Assessment & Plan Note (Signed)
Summary: pt/ inr  Nurse Visit   Allergies: 1)  ! Penicillin 2)  ! Codeine  Impression & Recommendations:  Problem # 1:  COUMADIN THERAPY (ICD-V58.61)  Orders: Fingerstick ([address]) Protime INR (60454)  Complete Medication List: 1)  Altace 5 Mg Caps (Ramipril) .... Take 1 capsule by mouth once a day 2)  Toprol Xl 50 Mg Xr24h-tab (Metoprolol succinate) .... Take 2 tablets two times a day 3)  Lipitor 80 Mg Tabs (Atorvastatin calcium) .... Take one tablet by mouth daily. 4)  Gabapentin 300 Mg Caps (Gabapentin) .... Take 1 capsule by mouth three times a day 5)  Metformin Hcl 500 Mg Tabs (Metformin hcl) .... Take 1 tablet by mouth once a day 6)  Prilosec Otc 20 Mg Tbec (Omeprazole magnesium) .... Take 1 tablet by mouth once a day 7)  Hydrocodone-acetaminophen 5-325 Mg Tabs (Hydrocodone-acetaminophen) .... As needed 8)  Ferrous Sulfate 325 (65 Fe) Mg Tabs (Ferrous sulfate) .... Take two times a day with food 9)  Digoxin 0.125 Mg Tabs (Digoxin) .... Take one tablet by mouth daily 10)  Glucometer.  .... Dx. 250.00.   strips and lancet to test up to twice a day. 11)  Fish Oil 1200 Mg Caps (Omega-3 fatty acids) .... Take 1 capsule by mouth two times a day 12)  Coumadin 3 Mg Tabs (Warfarin sodium) .... Sunday - 3 mg, monday - 3 mg, tuesday - 3 mg, wednesday - 1.5 mg, thursday - 3 mg, friday - 3 mg, saturday - 3 mg  Laboratory Results   Blood Tests   Date/Time Received: 10/26/2009 Date/Time Reported: 10/26/2009  PT: 18.2 s   (Normal Range: 10.6-13.4)  INR: 2.2   (Normal Range: 0.88-1.12   Therap INR: 2.0-3.5) Comments: Continue current coumadin dosing, INR is therapeutic. Plan follow up in 1 month.    Orders Added: 1)  Fingerstick [36416] 2)  Protime INR [85610] 3)  Est. Patient Level I [09811]

## 2010-07-10 NOTE — Assessment & Plan Note (Signed)
Summary: PT CKECK//VGJ  Nurse Visit   Allergies: 1)  ! Penicillin 2)  ! Codeine Laboratory Results   Blood Tests   Date/Time Received: 01/10/10 Date/Time Reported: 01/10/10   INR: 3.4   (Normal Range: 0.88-1.12   Therap INR: 2.0-3.5)    Orders Added: 1)  Fingerstick [36416] 2)  Protime [[id number]]   Anticoagulation Management History:      The patient is on coumadin and comes in today for a routine follow up visit.  He is being anticoagulated due to chronic atrial fibrillation.  Plans are to keep the patient on coumadin for life.  His last INR was 3.1 and today's INR is 3.4.    Anticoagulation Management Assessment/Plan:      The target INR is 2.0-3.0.  He is to have a PT/INR in 2 weeks.  Anticoagulation instructions were given to patient.         Current Anticoagulation Instructions: The patient is to hold (do not take) the Wednesday dose of coumadin.  The dosage to be resumed includes: Coumadin 3 mg tabs:  Sunday - 1.5 mg, Monday - 3 mg, Tuesday - 3 mg, Wednesday - 1.5 mg, Thursday - 3 mg, Friday - 1.5 mg, Saturday - 3 mg.  Repeat PT/INR in 2 weeks.

## 2010-07-10 NOTE — Progress Notes (Signed)
Summary: Coumadin for PAF  Phone Note Outgoing Call   Call placed by: Julieta Gutting, RN, BSN,  June 22, 2009 3:28 PM Summary of Call: I spoke with the pt's wife after receiving a letter from [facility] stating that the pt's coumadin had been discontinued.  The pt's wife verified that the pt was no longer taking coumadin. Per Dr Excell Seltzer the pt did have Atrial Fibrillation during his hospitalization.  I made her aware that Dr Excell Seltzer wanted the pt to remain on coumadin because of PAF.  I spoke with the coumadin clinic and they will contact the pt to get coumadin restarted. Initial call taken by: Julieta Gutting, RN, BSN,  June 22, 2009 3:28 PM  Follow-up for Phone Call        Spoke with pt's wife advised to have pt resume coumadin 4.5mg  x 1 dose then 3mg  daily.  Sent rx for Warfarin 3mg  tabs to pharmacy.  Made f/u visit in clinic for 06/29/09. Follow-up by: Cloyde Reams RN,  June 22, 2009 4:54 PM

## 2010-07-10 NOTE — Assessment & Plan Note (Signed)
Summary: 3 WEEK INR  Nurse Visit   Vitals Entered By: Payton Spark CMA (September 15, 2009 1:44 PM)  Allergies: 1)  ! Penicillin 2)  ! Codeine Laboratory Results   Blood Tests     PT: 22.3 s   (Normal Range: 10.6-13.4)  INR: 3.4   (Normal Range: 0.88-1.12   Therap INR: 2.0-3.5)    Orders Added: 1)  Fingerstick [36416] 2)  Protime INR [04540]   Anticoagulation Management History:      The patient is on coumadin and comes in today for a routine follow up visit.  The patient is taking Coumadin for chronic atrial fibrillation.  Plans are to keep the patient on coumadin for life.  His last INR was 3.0 and today's INR is 3.4.    Anticoagulation Management Assessment/Plan:      The target INR is 2.0-3.0.  He is to have a PT/INR in 1 week.  Anticoagulation instructions were given to patient.         Current Anticoagulation Instructions: The patient's dosage of coumadin will be decreased.  The new dosage includes: Coumadin 3 mg tabs:  Sunday - 3 mg, Monday - 3 mg, Tuesday - 3 mg, Wednesday - 1.5 mg, Thursday - 3 mg, Friday - 1.5 mg, Saturday - 3 mg.  Repeat PT/INR in 1 week.

## 2010-07-10 NOTE — Assessment & Plan Note (Signed)
Summary: NURSE VISIT-VEW  Nurse Visit   Vitals Entered By: Payton Spark CMA (May 10, 2010 1:24 PM)  Allergies: 1)  ! Penicillin 2)  ! Codeine Laboratory Results   Blood Tests      INR: 1.7   (Normal Range: 0.88-1.12   Therap INR: 2.0-3.5)    Orders Added: 1)  Fingerstick [36416] 2)  Protime [84132GM]   Anticoagulation Management History:      The patient is on coumadin and comes in today for a routine follow up visit.  Coumadin therapy is being given due to chronic atrial fibrillation.  Plans are to keep the patient on coumadin for life.  His last INR was 2.1 and today's INR is 1.7.    Anticoagulation Management Assessment/Plan:      The target INR is 2.0-3.0.  He is to have a PT/INR in 1 week.  Anticoagulation instructions were given to patient.         Current Anticoagulation Instructions: The patient's dosage of coumadin will be increased.  The new dosage includes: Coumadin 3 mg tabs and Coumadin 1 mg tabs:  Sunday - 3 mg, Monday - 3 mg, Tuesday - 3 mg, Wednesday - 3 mg, Thursday - 4 mg, Friday - 3 mg, Saturday - 3 mg.  Repeat PT/INR in 1 week.    Pt aware of the above.Arvilla Market CMA, Michelle May 10, 2010 1:48 PM

## 2010-07-10 NOTE — Miscellaneous (Signed)
Summary: Digoxin Rx  The pt was seen in the [facility] today and while reviewing the pt's medications from hospital discharge they noticed that the pt was not taking Digoxin.  A Rx was sent into [facility] for this medication.  Clinical Lists Changes  Medications: Added new medication of DIGOXIN 0.125 MG TABS (DIGOXIN) Take one tablet by mouth daily - Signed Rx of DIGOXIN 0.125 MG TABS (DIGOXIN) Take one tablet by mouth daily;  #30 x 6;  Signed;  Entered by: Julieta Gutting, RN, BSN;  Authorized by: Norva Karvonen, MD;  Method used: Electronically to [facility] #[id number]*, [address], [place], [place]  [address], Ph: [phone], Fax: [phone]    Prescriptions: DIGOXIN 0.125 MG TABS (DIGOXIN) Take one tablet by mouth daily  #30 x 6   Entered by:   Julieta Gutting, RN, BSN   Authorized by:   Norva Karvonen, MD   Signed by:   Julieta Gutting, RN, BSN on 06/29/2009   Method used:   Electronically to        [facility] [phone]* (retail)       [address] [place], [place]  [address]       Ph: [phone]       Fax: [phone]   RxID:   [id number]

## 2010-07-10 NOTE — Assessment & Plan Note (Signed)
Summary: FLU SHOT  Nurse Visit  Flu Vaccine Consent Questions     Do you have a history of severe allergic reactions to this vaccine? no    Any prior history of allergic reactions to egg and/or gelatin? no    Do you have a sensitivity to the preservative Thimersol? no    Do you have a past history of Guillan-Barre Syndrome? no    Do you currently have an acute febrile illness? no    Have you ever had a severe reaction to latex? no    Vaccine information given and explained to patient? yes    Are you currently pregnant? no    Lot Number:[id number]   Exp Date:12/08/2010   Site Given  Left Deltoid IM  Allergies: 1)  ! Penicillin 2)  ! Codeine  Orders Added: 1)  Admin 1st Vaccine [90471] 2)  Flu Vaccine 25yrs + [44010]

## 2010-07-10 NOTE — Assessment & Plan Note (Signed)
Summary: coumadin check  Nurse Visit   Allergies: 1)  ! Penicillin 2)  ! Codeine Laboratory Results   Blood Tests   Date/Time Received: 10/05/2009 Date/Time Reported: 10/05/2009  PT: 16.2 s   (Normal Range: 10.6-13.4)  INR: 1.7   (Normal Range: 0.88-1.12   Therap INR: 2.0-3.5)    Orders Added: 1)  Fingerstick [36416] 2)  Protime INR [[address]]   Anticoagulation Management History:      The patient is on coumadin and comes in today for a routine follow up visit.  The patient is taking Coumadin for chronic atrial fibrillation.  Plans are to keep the patient on coumadin for life.  His last INR was 2.3 and today's INR is 1.7.    Anticoagulation Management Assessment/Plan:      The target INR is 2.0-3.0.  He is to have a PT/INR in 1 week.  Anticoagulation instructions were given to patient.         Current Anticoagulation Instructions: The patient's dosage of coumadin will be increased.  The new dosage includes: Take 1.5 tabs tonight adn then as below. Coumadin 3 mg tabs:  Sunday - 3 mg, Monday - 3 mg, Tuesday - 3 mg, Wednesday - 1.5 mg, Thursday - 3 mg, Friday - 3 mg, Saturday - 3 mg.  Repeat PT/INR in 1 week.

## 2010-07-10 NOTE — Progress Notes (Signed)
Summary: Rx's  Phone Note Call from Patient   Caller: Patient Summary of Call: Dr.Merheney     [phone]  Patient came in today and want to get his Rx that Dr.Burns wrote for him he wany Dr.Metheney to write them for him.Marland KitchenMarland KitchenGabapentin 300 cap, Lipitor 80mg , Digoxin 0.125.Marland KitchenMarland KitchenHe need a Rx now for metformin 500 mg tab.Marland KitchenMarland KitchenPatient is aware that Dr.Metheney id out of the office. Initial call taken by: Vanessa Swazil[place],  November 29, 2009 3:58 PM    Prescriptions: DIGOXIN 0.125 MG TABS (DIGOXIN) Take one tablet by mouth daily  #30 x 3   Entered by:   Payton Spark CMA   Authorized by:   [name] MD   Signed by:   Payton Spark CMA on 11/29/2009   Method used:   Electronically to        [facility] [phone]* (retail)       [address] [place], [place]  [address]       Ph: [phone]       Fax: [phone]   RxID:   [id number] METFORMIN HCL 500 MG TABS (METFORMIN HCL) Take 1 tablet by mouth once a day  #30 x 3   Entered by:   Payton Spark CMA   Authorized by:   [name] MD   Signed by:   Payton Spark CMA on 11/29/2009   Method used:   Electronically to        [facility] [phone]* (retail)       [address] [place], [place]  [address]       Ph: [phone]       Fax: [phone]   RxID:   [id number] GABAPENTIN 300 MG CAPS (GABAPENTIN) Take 1 capsule by mouth three times a day  #90 x 3   Entered by:   Payton Spark CMA   Authorized by:   [name] MD   Signed by:   Payton Spark CMA on 11/29/2009   Method used:   Electronically to        [facility] [phone]* (retail)       [address] [place], [place]  [address]       Ph: [phone]       Fax: [phone]   RxID:   [id number] LIPITOR 80 MG TABS (ATORVASTATIN CALCIUM) Take one tablet by mouth daily.  #30 x 3   Entered by:   Payton Spark CMA   Authorized by:   [name] MD   Signed by:   Payton Spark CMA on 11/29/2009   Method used:    Electronically to        [facility] [phone]* (retail)       [address] [place], [place]  [address]       Ph: [phone]       Fax: [phone]   RxID:   [id number]

## 2010-07-10 NOTE — Letter (Signed)
Summary: [facility]  [facility]   Imported By: Lanelle Bal 08/08/2009 13:13:19  _____________________________________________________________________  External Attachment:    Type:   Image     Comment:   External Document

## 2010-07-10 NOTE — Miscellaneous (Signed)
Summary: Orders Update  Clinical Lists Changes  Problems: Added new problem of ABDOMINAL ANEURYSM WITHOUT MENTION OF RUPTURE (ICD-441.4) Orders: Added new Test order of Abdominal Aorta Duplex (Abd Aorta Duplex) - Signed

## 2010-07-10 NOTE — Assessment & Plan Note (Signed)
Summary: Coumadin check - jr  Nurse Visit   Allergies: 1)  ! Penicillin 2)  ! Codeine Laboratory Results   Blood Tests   Date/Time Received: 11/27/2009 Date/Time Reported: 11/27/2009   INR: 2.2   (Normal Range: 0.88-1.12   Therap INR: 2.0-3.5)    Orders Added: 1)  Fingerstick [36416] 2)  Protime INR [[address]10]   Anticoagulation Management History:      The patient is on coumadin and comes in today for a routine follow up visit.  The patient is on Coumadin for chronic atrial fibrillation.  Plans are to keep the patient on coumadin for life.  His last INR was 2.2 and today's INR is 2.2.    Anticoagulation Management Assessment/Plan:      The target INR is 2.0-3.0.  He is to have a PT/INR in 4 weeks.  Anticoagulation instructions were given to patient.         Current Anticoagulation Instructions: The patient is to continue with the same dose of coumadin.  This dosage includes: Coumadin 3 mg tabs:  Sunday - 3 mg, Monday - 3 mg, Tuesday - 3 mg, Wednesday - 1.5 mg, Thursday - 3 mg, Friday - 3 mg, Saturday - 3 mg.    Repeat PT/INR in 4 weeks.

## 2010-07-10 NOTE — Assessment & Plan Note (Signed)
Summary: inr CHECK - JR  Nurse Visit   Allergies: 1)  ! Penicillin 2)  ! Codeine Laboratory Results   Blood Tests   Date/Time Received: 03/09/10 Date/Time Reported: 03/09/10   INR: 1.7   (Normal Range: 0.88-1.12   Therap INR: 2.0-3.5)    Orders Added: 1)  Fingerstick [36416] 2)  Protime INR [85610]   Anticoagulation Management History:      The patient is on coumadin and comes in today for a routine follow up visit.  He is being anticoagulated because of chronic atrial fibrillation.  Plans are to keep the patient on coumadin for life.  His last INR was 1.8 and today's INR is 1.7.    Anticoagulation Management Assessment/Plan:      The target INR is 2.0-3.0.  He is to have a PT/INR in 2 weeks.  Anticoagulation instructions were given to patient.         Current Anticoagulation Instructions: The patient's dosage of coumadin will be increased.  The new dosage includes:   Coumadin 3 mg tabs:  Sunday - 3 mg, Monday - 3 mg, Tuesday - 3 mg, Wednesday - 3 mg, Thursday - 3 mg, Friday - 3 mg, Saturday - 3 mg.    Repeat PT/INR in 2 weeks.

## 2010-07-10 NOTE — Progress Notes (Signed)
Summary: Refill, pt has been out of medication for four days  Phone Note Refill Request Message from:  Patient  Refills Requested: Medication #1:  TOPROL XL 50 MG XR24H-TAB Take 2 tablets two times a day Gwendel Hanson [phone] pt is out medication  Initial call taken by: Judie Grieve,  Nov 02, 2009 4:44 PM  Follow-up for Phone Call        Rx faxed to pharmacy Follow-up by: Vikki Ports,  Nov 03, 2009 8:59 AM    Prescriptions: TOPROL XL 50 MG XR24H-TAB (METOPROLOL SUCCINATE) Take 2 tablets two times a day  #120 x 6   Entered by:   Vikki Ports   Authorized by:   Norva Karvonen, MD   Signed by:   Vikki Ports on 11/03/2009   Method used:   Faxed to ...       [address] [phone]* (retail)       [address] [place], [place]  [address]       Ph: [phone]       Fax: [phone]   RxID:   [id number]

## 2010-07-10 NOTE — Assessment & Plan Note (Signed)
Summary: INR  Nurse Visit   Vitals Entered By: Avon Gully CMA, Duncan Dull) (April 19, 2010 1:51 PM)  Allergies: 1)  ! Penicillin 2)  ! Codeine Laboratory Results   Blood Tests   Date/Time Received: 04/19/10 Date/Time Reported: 04/19/10   INR: 2.1   (Normal Range: 0.88-1.12   Therap INR: 2.0-3.5)    Orders Added: 1)  Fingerstick [36416] 2)  Protime INR [85610]   Anticoagulation Management History:      The patient is on coumadin and comes in today for a routine follow up visit.  Coumadin therapy is being given due to chronic atrial fibrillation.  Plans are to keep the patient on coumadin for life.  His last INR was 2.58 and today's INR is 2.1.    Anticoagulation Management Assessment/Plan:      The target INR is 2.0-3.0.  He is to have a PT/INR in 3 weeks.         Current Anticoagulation Instructions: Same as Prior Instructions.

## 2010-07-12 ENCOUNTER — Ambulatory Visit: Admit: 2010-07-12 | Payer: Self-pay | Admitting: Family Medicine

## 2010-07-12 ENCOUNTER — Ambulatory Visit: Payer: Self-pay

## 2010-07-12 NOTE — Progress Notes (Signed)
Summary: Question about meds  Phone Note Call from Patient Call back at Home Phone [phone] Call back at [phone]   Caller: Spouse Reason for Call: Talk to Nurse Summary of Call: Wife states since pt has increased dosage of meds for his feet he acts like he cannot concentrate Initial call taken by: Lannette Donath,  June 27, 2010 3:20 PM  Follow-up for Phone Call        Amitryptiline or the neurontin?  Follow-up by: [name] MD,  June 28, 2010 8:33 AM  Additional Follow-up for Phone Call Additional follow up Details #1::        called and left message for pt to call back Additional Follow-up by: Avon Gully CMA, Duncan Dull),  June 28, 2010 10:08 AM    Additional Follow-up for Phone Call Additional follow up Details #2::    pt was using amitryptiline per spouse Follow-up by: Avon Gully CMA, Duncan Dull),  June 28, 2010 1:11 PM  Additional Follow-up for Phone Call Additional follow up Details #3:: Details for Additional Follow-up Action Taken: ok can drop bact to one tab. does it seem to help his feet? 1:33 PM June 29, 2010  McCrimmon CMA, Duncan Dull), Sue Lush left message on vm    Additional Follow-up by: [name] MD,  June 28, 2010 1:24 PM  New/Updated Medications: AMITRIPTYLINE HCL 50 MG TABS (AMITRIPTYLINE HCL) Take 1 tablet by mouth once a day

## 2010-07-12 NOTE — Assessment & Plan Note (Signed)
Summary: UTI specimen  Nurse Visit   Primary Care Candice Tobey:  [name]   History of Present Illness: burning with urination since yesterday. [facility] [place].     Impression & Recommendations:  Problem # 1:  DYSURIA (ICD-788.1)  Will treat. Follow up for repeat UA in 2 weeks.  Orders: UA Dipstick w/o Micro (automated)  (81003) T-Culture, Urine (54098-11914)  His updated medication list for this problem includes:    Ciprofloxacin Hcl 500 Mg Tabs (Ciprofloxacin hcl) .Marland Kitchen... Take 1 tablet by mouth two times a day for 10 days  Complete Medication List: 1)  Ramipril 10 Mg Caps (Ramipril) .... Take one capsule by mouth daily 2)  Toprol Xl 50 Mg Xr24h-tab (Metoprolol succinate) .... Take 2 tablets two times a day 3)  Lipitor 80 Mg Tabs (Atorvastatin calcium) .... Take one tablet by mouth daily. 4)  Gabapentin 300 Mg Caps (Gabapentin) .... Take 1 capsule by mouth three times a day 5)  Metformin Hcl 500 Mg Tabs (Metformin hcl) .... Take 1 tablet by mouth once a day 6)  Prilosec Otc 20 Mg Tbec (Omeprazole magnesium) .... Take 1 tablet by mouth once a day 7)  Digoxin 0.125 Mg Tabs (Digoxin) .... Take one tablet by mouth daily 8)  Glucometer.  .... Dx. 250.00.   strips and lancet to test up to twice a day. 9)  Fish Oil 1200 Mg Caps (Omega-3 fatty acids) .... Take 1 capsule by mouth two times a day 10)  Coumadin 3 Mg Tabs (Warfarin sodium) .... Sunday - 3 mg, monday - 3 mg, tuesday - 3 mg, wednesday - 3 mg, thursday - 4 mg, friday - 3 mg, saturday - 3 mg 11)  Coumadin 1 Mg Tabs (Warfarin sodium) .... Sunday - 3 mg, monday - 3 mg, tuesday - 3 mg, wednesday - 3 mg, thursday - 4 mg, friday - 3 mg, saturday - 3 mg 12)  Ciprofloxacin Hcl 500 Mg Tabs (Ciprofloxacin hcl) .... Take 1 tablet by mouth two times a day for 10 days   Allergies: 1)  ! Penicillin 2)  ! Codeine Laboratory Results   Urine Tests  Date/Time Received: 05/29/10 Date/Time Reported: 05/29/10  Routine Urinalysis   Color:  yellow Appearance: Clear Glucose: negative   (Normal Range: Negative) Bilirubin: negative   (Normal Range: Negative) Ketone: negative   (Normal Range: Negative) Spec. Gravity: 1.025   (Normal Range: 1.003-1.035) Blood: large   (Normal Range: Negative) pH: 6.5   (Normal Range: 5.0-8.0) Protein: 100   (Normal Range: Negative) Urobilinogen: 1.0   (Normal Range: 0-1) Nitrite: positive   (Normal Range: Negative) Leukocyte Esterace: large   (Normal Range: Negative)       Orders Added: 1)  UA Dipstick w/o Micro (automated)  [81003] 2)  T-Culture, Urine [87086-70010] 3)  Est. Patient Level I [99211] Prescriptions: CIPROFLOXACIN HCL 500 MG TABS (CIPROFLOXACIN HCL) Take 1 tablet by mouth two times a day for 10 days  #20 x 0   Entered and Authorized by:   Catherine Metheney MD   Signed by:   Catherine Metheney MD on 05/29/2010   Method used:   Electronically to        [facility] [address] #[id number]* (retail)       [address], [place]  [address]       Ph: [phone]       Fax: [phone]   RxID:   [id number]  4:23 PM May 29, 2010 McCrimmon CMA, (  AAMA), Sue Lush left message on pt's vm that we will send rx and to f/u in 2 weeks for a nurse visit to repeat u/a

## 2010-07-12 NOTE — Assessment & Plan Note (Addendum)
Summary: neuropathy, diabetes, andCoumadin   Vital Signs:  Patient profile:   77 year old male Height:      66.5 inches Weight:      162 pounds Pulse rate:   56 / minute BP sitting:   162 / 70  (right arm) Cuff size:   regular  Vitals Entered By: Avon Gully CMA, Duncan Dull) (June 14, 2010 1:47 PM) CC:  check feet,feet feel cold but hot to the touch   Primary Care Provider:  Seymour Bars  CC:   check feet and feet feel cold but hot to the touch.  History of Present Illness:  check feet,feet feel cold but hot to the touch.  has been that way for a year. Having shooting heel pain when lays down at night.he also frequently gets a burning sensation on his feet.  He says it is worse when he is barefoot.  Hx of DM since 2006.  He is already on Neurontin. He says he only takes it two times a day instead of three times a day. HE is not really convinced it helps him. he says he did try some of his wife's capsaicin cream at one time this is what I was unable to tolerate the burning sensation.  He also completed his antibiotic for his urinary tract infection.  He says about 3 days after he completed the course he started noticing burning in his penis again.  He did have a positive culture and the Cipro should have been sensitive.  He is not having any fevers nausea or back pain.  Anticoagulation Management History:      The patient is on coumadin and comes in today for a routine follow up visit.  Coumadin therapy is being given due to chronic atrial fibrillation.  Plans are to keep the patient on coumadin for life.  His last INR was 2.4 and today's INR is 2.3.    Diabetes Management History:      The patient is a 77 years old male who comes in for evaluation of DM Type 2.  He has not been enrolled in the "Diabetic Education Program".  He states understanding of dietary principles but he is not following the appropriate diet.  Sensory loss is noted.  Self foot exams are being performed.  He is  checking home blood sugars.  He says that he is not exercising regularly.        Hypoglycemic symptoms are not occurring.  No hyperglycemic symptoms are reported.  Other comments include: no exercise.        There are no symptoms to suggest diabetic complications.  Since his last visit, no infections have occurred.  No changes have been made to his treatment plan since last visit.    Habits & Providers  Exercise-Depression-Behavior     Does Patient Exercise: no  Current Medications (verified): 1)  Ramipril 10 Mg Caps (Ramipril) .... Take One Capsule By Mouth Daily 2)  Toprol Xl 50 Mg Xr24h-Tab (Metoprolol Succinate) .... Take 2 Tablets Two Times A Day 3)  Lipitor 80 Mg Tabs (Atorvastatin Calcium) .... Take One Tablet By Mouth Daily. 4)  Gabapentin 300 Mg Caps (Gabapentin) .... Take 1 Capsule By Mouth Three Times A Day 5)  Metformin Hcl 500 Mg Tabs (Metformin Hcl) .... Take 1 Tablet By Mouth Once A Day 6)  Prilosec Otc 20 Mg Tbec (Omeprazole Magnesium) .... Take 1 Tablet By Mouth Once A Day 7)  Digoxin 0.125 Mg Tabs (Digoxin) .... Take One  Tablet By Mouth Daily 8)  Glucometer. .... Dx. 250.00.   Strips and Lancet To Test Up To Twice A Day. 9)  Fish Oil 1200 Mg Caps (Omega-3 Fatty Acids) .... Take 1 Capsule By Mouth Two Times A Day 10)  Coumadin 3 Mg Tabs (Warfarin Sodium) .... Sunday - 3 Mg, Monday - 3 Mg, Tuesday - 3 Mg, Wednesday - 3 Mg, Thursday - 4 Mg, Friday - 3 Mg, Saturday - 3 Mg 11)  Coumadin 1 Mg Tabs (Warfarin Sodium) .... Sunday - 3 Mg, Monday - 3 Mg, Tuesday - 3 Mg, Wednesday - 3 Mg, Thursday - 4 Mg, Friday - 3 Mg, Saturday - 3 Mg 12)  Ciprofloxacin Hcl 500 Mg Tabs (Ciprofloxacin Hcl) .... Take 1 Tablet By Mouth Two Times A Day For 10 Days  Allergies (verified): 1)  ! Penicillin 2)  ! Codeine  Comments:  Nurse/Medical Assistant: The patient's medications and allergies were reviewed with the patient and were updated in the Medication and Allergy Lists. Avon Gully CMA,  Duncan Dull) (June 14, 2010 1:49 PM)  Family History: Reviewed history from 04/03/2010 and no changes required.   Notable for siblings with coronary artery disease. Father with MI Sister with DM Cholesterol runs the family.    Social History: Reviewed history from 06/29/2009 and no changes required.   The patient is married and lives with his wife in  [place]. 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 The patient has remote history of tobacco use, although he quit smoking 40 years ago. The patient denies significant alcohol consumption. HS degree.   Does Patient Exercise:  no  Physical Exam  General:  Well-developed,well-nourished,in no acute distress; alert,appropriate and cooperative throughout examination Lungs:  Normal respiratory effort, chest expands symmetrically. Lungs are clear to auscultation, no crackles or wheezes. Heart:  Normal rate and regular rhythm. S1 and S2 normal without gallop, murmur, click, rub or other extra sounds. Skin:  Feet with trace edema bilat. Varicose veings. I am unable to palpate DP or post tib pulses./ No hair on the tops of feet or the legs. He does have some hyperpigmentation of the lower legs.  He says they are often itchy.    Impression & Recommendations:  Problem # 1:  PERIPHERAL NEUROPATHY (ICD-356.9) Needs dopplers to evaluate your blood flow. he reports that he did have Dopplers approximately 3 years ago.  I will do my records and see if I had any documentation of this.  Since I am unable to palpate dorsal pulses today I would really like to repeat the scan and make sure that he does have good blood flow since he is at a new sensation of his feet feeling very cold.  Problem # 2:  COUMADIN THERAPY (ICD-V58.61) we adjust his Coumadin as as above.  Problem # 3:  DIABETES MELLITUS,  TYPE II (ICD-250.00) he is not too far off goal.  We discussed the options of increasing his metformin b.i.d. versus making some dietary changes.  He admits that his diet is not well controlled and that he could do a much better job at this.  We agreed for him to work on his diet and follow-up in 3 months to recheck another A1c. he is due for microalbumin and his next office visit.  He is also due for an updated  eye exam. His updated medication list for this problem includes:    Ramipril 10 Mg Caps (Ramipril) .Marland Kitchen... Take one capsule by mouth daily    Metformin Hcl 500 Mg Tabs (Metformin hcl) .Marland Kitchen... Take 1 tablet by mouth once a day  Problem # 4:  DYSURIA (ICD-788.1) I refilled the Cipro for 10 more days.  At that point if he has recurrent symptoms I would recommend having neurology see him to make sure that he is not harboring bacteria from some type of other lesions such as a polyp etc. in his bladder. The following medications were removed from the medication list:    Ciprofloxacin Hcl 500 Mg Tabs (Ciprofloxacin hcl) .Marland Kitchen... Take 1 tablet by mouth two times a day for 10 days  Complete Medication List: 1)  Ramipril 10 Mg Caps (Ramipril) .... Take one capsule by mouth daily 2)  Toprol Xl 50 Mg Xr24h-tab (Metoprolol succinate) .... Take 2 tablets two times a day 3)  Lipitor 80 Mg Tabs (Atorvastatin calcium) .... Take one tablet by mouth daily. 4)  Gabapentin 300 Mg Caps (Gabapentin) .... Take 1 tablet by mouth two times a day 5)  Metformin Hcl 500 Mg Tabs (Metformin hcl) .... Take 1 tablet by mouth once a day 6)  Prilosec Otc 20 Mg Tbec (Omeprazole magnesium) .... Take 1 tablet by mouth once a day 7)  Digoxin 0.125 Mg Tabs (Digoxin) .... Take one tablet by mouth daily 8)  Glucometer.  .... Dx. 250.00.   strips and lancet to test up to twice a day. 9)  Fish Oil 1200 Mg Caps (Omega-3 fatty acids) .... Take 1 capsule by mouth two times a day 10)  Amitriptyline Hcl 50 Mg Tabs (Amitriptyline hcl) .... 1/2  tab by bedtime for one week, then inc to whole tab nightly for one week then inc to 2 tabs if tolerate it well. 11)  Coumadin 3 Mg Tabs (Warfarin sodium) .... Sunday - 3 mg, monday - 3 mg, tuesday - 3 mg, wednesday - 1.5 mg, thursday - 3 mg, friday - 3 mg, saturday - 3 mg 12)  Coumadin 1 Mg Tabs (Warfarin sodium) .... Sunday - 3 mg, monday - 3 mg, tuesday - 3 mg, wednesday - 1.5 mg, thursday - 3 mg, friday - 3 mg, saturday - 3 mg  Anticoagulation Management Assessment/Plan:      The target INR is 2.0-3.0.  He is to have a PT/INR in 4 weeks.  Anticoagulation instructions were given to patient.         Current Anticoagulation Instructions: Coumadin 3 mg tabs and Coumadin 1 mg tabs:  Sunday - 3 mg, Monday - 3 mg, Tuesday - 3 mg, Wednesday - 1.5 mg, Thursday - 3 mg, Friday - 3 mg, Saturday - 3 mg.  Repeat PT/INR in 4 weeks.    Patient Instructions: 1)  Needs dopplers to evaluate your blood flow  2)  You really need to work on your diet and then follow u p in 3 months.  3)  Can start the amitriptiline in the evenings and see if getting better with your feet.  Prescriptions: AMITRIPTYLINE HCL 50 MG TABS (AMITRIPTYLINE HCL) 1/2 tab by bedtime for one week, then inc to whole tab nightly for one week then inc to 2 tabs if tolerate it well.  #60 x 1   Entered and Authorized by:   [name] MD   Signed by:   [name] MD on 06/14/2010   Method used:   Electronically  to        [facility] [phone]* (retail)       [address] [place], [place]  [address]       Ph: [phone]       Fax: [phone]   RxID:   [id number]    Orders Added: 1)  Est. Patient Level IV [24401]    Laboratory Results   Blood Tests   Date/Time Received: 06/14/09 Date/Time Reported: 06/14/09   INR: 2.3   (Normal Range: 0.88-1.12   Therap INR: 2.0-3.5)        Appended Document: neuropathy, diabetes, and Coumadin    Clinical Lists Changes  Orders: Added new Service order of  Hgb A1C (02725DG) - Signed Added new Service order of Fingerstick [phone]) - Signed Observations: Added new observation of HGBA1C: 7.1 % (06/15/2010 8:28)      Laboratory Results   Blood Tests   Date/Time Received: 06/14/09 Date/Time Reported: 06/14/09  HGBA1C: 7.1%   (Normal Range: Non-Diabetic - 3-6%   Control Diabetic - 6-8%)     Appended Document: neuropathy, diabetes, andCoumadin

## 2010-07-31 ENCOUNTER — Encounter: Payer: Self-pay | Admitting: Family Medicine

## 2010-07-31 ENCOUNTER — Ambulatory Visit (INDEPENDENT_AMBULATORY_CARE_PROVIDER_SITE_OTHER): Payer: MEDICARE | Admitting: Family Medicine

## 2010-07-31 DIAGNOSIS — Z7901 Long term (current) use of anticoagulants: Secondary | ICD-10-CM

## 2010-07-31 DIAGNOSIS — N289 Disorder of kidney and ureter, unspecified: Secondary | ICD-10-CM

## 2010-07-31 DIAGNOSIS — N390 Urinary tract infection, site not specified: Secondary | ICD-10-CM

## 2010-07-31 LAB — CONVERTED CEMR LAB
Glucose, Urine, Semiquant: 100
INR: 2
Nitrite: POSITIVE
Protein, U semiquant: >=300
Urobilinogen, UA: 1
pH: 5.5

## 2010-08-01 ENCOUNTER — Encounter: Payer: Self-pay | Admitting: Family Medicine

## 2010-08-06 ENCOUNTER — Ambulatory Visit (INDEPENDENT_AMBULATORY_CARE_PROVIDER_SITE_OTHER): Payer: MEDICARE

## 2010-08-06 ENCOUNTER — Encounter: Payer: Self-pay | Admitting: Family Medicine

## 2010-08-06 DIAGNOSIS — I4891 Unspecified atrial fibrillation: Secondary | ICD-10-CM

## 2010-08-06 DIAGNOSIS — Z7901 Long term (current) use of anticoagulants: Secondary | ICD-10-CM

## 2010-08-06 LAB — CONVERTED CEMR LAB: INR: 3.5

## 2010-08-07 NOTE — Assessment & Plan Note (Signed)
Summary: UTI, coumadin   Vital Signs:  Patient profile:   77 year old male Height:      66.5 inches Weight:      155 pounds Pulse rate:   92 / minute BP sitting:   158 / 81  (right arm) Cuff size:   regular  Vitals Entered By: Avon Gully CMA, Duncan Dull) (July 31, 2010 10:22 AM) CC: INR, UTI   Primary Care Provider:  Seymour Bars  CC:  INR and UTI.  History of Present Illness: Urinary sxs for 2 weeks. Has had dysuria for the past 3 days. Tooks AZO. NO fever. No new back pain. No hematuria.      Home health agent came to pick up wheelchair and stole medication bottle for the hydrocodone. It was almost a full bottle. This was rx by Dr. Isabel Caprice. I let him know he needs to call their office since His urologist is the prescriber.    Anticoagulation Management History:      Anticoagulation is being administered due to chronic atrial fibrillation.  Plans are to keep the patient on coumadin for life.  His last INR was 2.3 and today's INR is 2.0.    Allergies: 1)  ! Penicillin 2)  ! Codeine  Physical Exam  General:  Well-developed,well-nourished,in no acute distress; alert,appropriate and cooperative throughout examination Lungs:  Normal respiratory effort, chest expands symmetrically. Lungs are clear to auscultation, no crackles or wheezes. Heart:  Normal rate and regular rhythm. S1 and S2 normal without gallop, murmur, click, rub or other extra sounds. Skin:  no rashes.   Psych:  Cognition and judgment appear intact. Alert and cooperative with normal attention span and concentration. No apparent delusions, illusions, hallucinations   Impression & Recommendations:  Problem # 1:  UTI (ICD-599.0)  UA is clearly +. Will start bactrim for one week. Will send a culture Return for repeat UA once completed aBX for one week ( so in 2 weeks from today).  His updated medication list for this problem includes:    Bactrim Ds 800-160 Mg Tabs (Sulfamethoxazole-trimethoprim) .Marland Kitchen... Take 1  tablet by mouth two times a day for 7 days  Orders: T-Urine Culture (Spectrum Order) [id number])  Problem # 2:  COUMADIN THERAPY (ICD-V58.61) No bleeding or SOB or dizziness. Adjusted coumadin as below.  Orders: Fingerstick (69629) Protime INR (52841)  Complete Medication List: 1)  Ramipril 10 Mg Caps (Ramipril) .... Take one capsule by mouth daily 2)  Toprol Xl 50 Mg Xr24h-tab (Metoprolol succinate) .... Take 2 tablets two times a day 3)  Lipitor 80 Mg Tabs (Atorvastatin calcium) .... Take one tablet by mouth daily. 4)  Gabapentin 300 Mg Caps (Gabapentin) .... Take 1 tablet by mouth two times a day 5)  Metformin Hcl 500 Mg Tabs (Metformin hcl) .... Take 1 tablet by mouth once a day 6)  Prilosec Otc 20 Mg Tbec (Omeprazole magnesium) .... Take 1 tablet by mouth once a day 7)  Digoxin 0.125 Mg Tabs (Digoxin) .... Take one tablet by mouth daily 8)  Glucometer.  .... Dx. 250.00.   strips and lancet to test up to twice a day. 9)  Fish Oil 1200 Mg Caps (Omega-3 fatty acids) .... Take 1 capsule by mouth two times a day 10)  Amitriptyline Hcl 50 Mg Tabs (Amitriptyline hcl) .... Take 1 tablet by mouth once a day 11)  Bactrim Ds 800-160 Mg Tabs (Sulfamethoxazole-trimethoprim) .... Take 1 tablet by mouth two times a day for 7 days 12)  Coumadin 3  Mg Tabs (Warfarin sodium) .... "Sunday - 3 mg, monday - 3 mg, tuesday - 3 mg, wednesday - 3 mg, thursday - 3 mg, friday - 3 mg, saturday - 3 mg  Other Orders: UA Dipstick w/o Micro (automated)  (81003)  Anticoagulation Management Assessment/Plan:      The target INR is 2.0-3.0.  He is to have a PT/INR in 1 week.         Current Anticoagulation Instructions: The patient's dosage of coumadin will be increased.  The new dosage includes: Coumadin 3 mg tabs:  Sunday - 3 mg, Monday - 3 mg, Tuesday - 3 mg, Wednesday - 3 mg, Thursday - 3 mg, Friday - 3 mg, Saturday - 3 mg.  Repeat PT/INR in 1 week.     Patient Instructions: 1)  Follow up on Monday for INR  check for your coumadin 2)  Then once has finished the antibiotic will recheck your urine in one week to make sure the infection has cleared.  Prescriptions: BACTRIM DS 800-160 MG TABS (SULFAMETHOXAZOLE-TRIMETHOPRIM) Take 1 tablet by mouth two times a day for 7 days  #14 x 0   Entered and Authorized by:   Catherine Metheney MD   Signed by:   Catherine Metheney MD on 07/31/2010   Method used:   Electronically to        [facility] [address] [id number]* (retail)       [address], [place]  [address]       Ph: [phone]       Fax: [phone]   RxID:   [id number]    Orders Added: 1)  Fingerstick [36416] 2)  UA Dipstick w/o Micro (automated)  [81003] 3)  Protime INR [85610] 4)  T-Urine Culture (Spectrum Order) [[id number]] 5)  Est. Patient Level III [95638]    Laboratory Results   Urine Tests  Date/Time Received: 07/31/10 Date/Time Reported: 07/31/10  Routine Urinalysis   Color: orange Appearance: Clear Glucose: 100   (Normal Range: Negative) Bilirubin: small   (Normal Range: Negative) Ketone: small (15)   (Normal Range: Negative) Spec. Gravity: 1.020   (Normal Range: 1.003-1.035) Blood: moderate   (Normal Range: Negative) pH: 5.5   (Normal Range: 5.0-8.0) Protein: >=300   (Normal Range: Negative) Urobilinogen: 1.0   (Normal Range: 0-1) Nitrite: positive   (Normal Range: Negative) Leukocyte Esterace: large   (Normal Range: Negative)     Blood Tests   Date/Time Received: 07/31/10 Date/Time Reported: 07/31/10   INR: 2.0   (Normal Range: 0.88-1.12   Therap INR: 2.0-3.5)

## 2010-08-08 ENCOUNTER — Encounter: Payer: Self-pay | Admitting: Family Medicine

## 2010-08-10 ENCOUNTER — Telehealth: Payer: Self-pay | Admitting: Family Medicine

## 2010-08-14 ENCOUNTER — Encounter: Payer: Self-pay | Admitting: Family Medicine

## 2010-08-14 ENCOUNTER — Ambulatory Visit (INDEPENDENT_AMBULATORY_CARE_PROVIDER_SITE_OTHER): Payer: MEDICARE

## 2010-08-14 DIAGNOSIS — N390 Urinary tract infection, site not specified: Secondary | ICD-10-CM

## 2010-08-14 LAB — CONVERTED CEMR LAB
Bilirubin Urine: NEGATIVE
Nitrite: POSITIVE
Protein, U semiquant: 100
pH: 5.5

## 2010-08-15 ENCOUNTER — Other Ambulatory Visit: Payer: Self-pay | Admitting: Family Medicine

## 2010-08-16 NOTE — Medication Information (Signed)
Summary: INR check - jr  Anticoagulant Therapy  Managed by: Inactive Referring MD: Tonny Bollman PCP: Seymour Bars Supervising MD: Juanda Chance MD, Bruce Indication 1: Prophylaxis DVT (high risk surgery) Lab Used: [facility] [place] Site: [address] INR RANGE 2.0-3.0           Allergies: 1)  ! Penicillin 2)  ! Codeine  Anticoagulation Management History:      The patient is on coumadin and comes in today for a routine follow up visit.  Anticoagulation is being administered due to chronic atrial fibrillation.  Plans are to keep the patient on coumadin for life.  His last INR was 2.0 and today's INR is 3.5.    Anticoagulation Management Assessment/Plan:      The target INR is 2.0-3.0.  He is to have a PT/INR in 4 weeks.  Anticoagulation instructions were given to patient.  Results were reviewed/authorized by Inactive.         Current Anticoagulation Instructions: The patient's dosage of coumadin will be decreased.  The new dosage includes:  Coumadin 3 mg tabs:  Sunday - 3 mg, Monday - 3 mg, Tuesday - 3 mg, Wednesday - 1.5 mg, Thursday - 3 mg, Friday - 3 mg, Saturday - 3 mg.    Repeat PT/INR in 4 weeks.    Laboratory Results   Blood Tests   Date/Time Received: 08/06/10 Date/Time Reported: 08/06/10   INR: 3.5   (Normal Range: 0.88-1.12   Therap INR: 2.0-3.5)      Anticoagulation Management History:      The patient is on coumadin and comes in today for a routine follow up visit.  Anticoagulation is being administered due to chronic atrial fibrillation.  Plans are to keep the patient on coumadin for life.  His last INR was 2.0 and today's INR is 3.5.    Anticoagulation Management Assessment/Plan:      The target INR is 2.0-3.0.  He is to have a PT/INR in 4 weeks.  Anticoagulation instructions were given to patient.  Results were reviewed/authorized by Inactive.         Current Anticoagulation Instructions: The patient's dosage of coumadin will be  decreased.  The new dosage includes:  Coumadin 3 mg tabs:  Sunday - 3 mg, Monday - 3 mg, Tuesday - 3 mg, Wednesday - 1.5 mg, Thursday - 3 mg, Friday - 3 mg, Saturday - 3 mg.    Repeat PT/INR in 4 weeks.

## 2010-08-17 LAB — URINE CULTURE

## 2010-08-21 NOTE — Progress Notes (Signed)
Summary: LEG arterial US at [facility]  Phone Note Outgoing Call   Summary of Call: Call pt:  US of the legs show he does have moderat right peripheral artery disase adn mild on the left left leg.main tx is controllnig BP and cholesterol. Can also start a baby ASA that is coated once a dya. Also controlling the diabeties is important too. I don't think this is a significant cause of pain.  Also try to keep feet warm with socks etc.  Initial call taken by: [name] MD,  August 10, 2010 4:55 PM  Follow-up for Phone Call        called and left message to call back Follow-up by: Avon Gully CMA, Duncan Dull),  August 15, 2010 12:19 PM  Additional Follow-up for Phone Call Additional follow up Details #1::        pt and pt's spouse notified Additional Follow-up by: Avon Gully CMA, Duncan Dull),  August 15, 2010 1:24 PM

## 2010-08-21 NOTE — Assessment & Plan Note (Signed)
Summary: urine sample- jr    Primary Care Provider:  [name] MD   History of Present Illness: recheck urine; completed abx for UTI  Allergies: 1)  ! Penicillin 2)  ! Codeine   Impression & Recommendations:  Problem # 1:  UTI (ICD-599.0)  UA still appears to be infected. We'll send a urine culture for further evaluation. His updated medication list for this problem includes:    Bactrim Ds 800-160 Mg Tabs (Sulfamethoxazole-trimethoprim) .Marland Kitchen... Take 1 tablet by mouth two times a day for 7 days  Orders: UA Dipstick w/o Micro (automated)  (81003) T-Urine Culture (Spectrum Order) [phone])  Complete Medication List: 1)  Ramipril 10 Mg Caps (Ramipril) .... Take one capsule by mouth daily 2)  Toprol Xl 50 Mg Xr24h-tab (Metoprolol succinate) .... Take 2 tablets two times a day 3)  Lipitor 80 Mg Tabs (Atorvastatin calcium) .... Take one tablet by mouth daily. 4)  Gabapentin 300 Mg Caps (Gabapentin) .... Take 1 tablet by mouth two times a day 5)  Metformin Hcl 500 Mg Tabs (Metformin hcl) .... Take 1 tablet by mouth once a day 6)  Prilosec Otc 20 Mg Tbec (Omeprazole magnesium) .... Take 1 tablet by mouth once a day 7)  Digoxin 0.125 Mg Tabs (Digoxin) .... Take one tablet by mouth daily 8)  Glucometer.  .... Dx. 250.00.   strips and lancet to test up to twice a day. 9)  Fish Oil 1200 Mg Caps (Omega-3 fatty acids) .... Take 1 capsule by mouth two times a day 10)  Amitriptyline Hcl 50 Mg Tabs (Amitriptyline hcl) .... Take 1 tablet by mouth once a day 11)  Bactrim Ds 800-160 Mg Tabs (Sulfamethoxazole-trimethoprim) .... Take 1 tablet by mouth two times a day for 7 days 12)  Coumadin 3 Mg Tabs (Warfarin sodium) .... Sunday - 3 mg, monday - 3 mg, tuesday - 3 mg, wednesday - 1.5 mg, thursday - 3 mg, friday - 3 mg, saturday - 3 mg   Orders Added: 1)  UA Dipstick w/o Micro (automated)  [81003] 2)  T-Urine Culture (Spectrum Order) [[id number]]    Laboratory Results   Urine  Tests  Date/Time Received: 08/14/10 Date/Time Reported: 08/14/10  Routine Urinalysis   Color: yellow Appearance: Clear Glucose: negative   (Normal Range: Negative) Bilirubin: negative   (Normal Range: Negative) Ketone: trace (5)   (Normal Range: Negative) Spec. Gravity: 1.020   (Normal Range: 1.003-1.035) Blood: small   (Normal Range: Negative) pH: 5.5   (Normal Range: 5.0-8.0) Protein: 100   (Normal Range: Negative) Urobilinogen: 0.2   (Normal Range: 0-1) Nitrite: positive   (Normal Range: Negative) Leukocyte Esterace: small   (Normal Range: Negative)

## 2010-08-27 ENCOUNTER — Ambulatory Visit (INDEPENDENT_AMBULATORY_CARE_PROVIDER_SITE_OTHER): Payer: MEDICARE

## 2010-08-27 ENCOUNTER — Encounter: Payer: Self-pay | Admitting: Family Medicine

## 2010-08-27 DIAGNOSIS — Z7901 Long term (current) use of anticoagulants: Secondary | ICD-10-CM

## 2010-08-27 DIAGNOSIS — N289 Disorder of kidney and ureter, unspecified: Secondary | ICD-10-CM

## 2010-08-27 LAB — CONVERTED CEMR LAB
Protein, U semiquant: >=300
Specific Gravity, Urine: 1.025
Urobilinogen, UA: 0.2
pH: 6

## 2010-09-03 ENCOUNTER — Ambulatory Visit (INDEPENDENT_AMBULATORY_CARE_PROVIDER_SITE_OTHER): Payer: MEDICARE | Admitting: Family Medicine

## 2010-09-03 ENCOUNTER — Telehealth: Payer: Self-pay | Admitting: Family Medicine

## 2010-09-03 DIAGNOSIS — I219 Acute myocardial infarction, unspecified: Secondary | ICD-10-CM

## 2010-09-03 LAB — POCT INR: INR: 2.8

## 2010-09-04 ENCOUNTER — Other Ambulatory Visit: Payer: Self-pay | Admitting: Family Medicine

## 2010-09-04 NOTE — Telephone Encounter (Signed)
a 

## 2010-09-06 NOTE — Progress Notes (Signed)
  Subjective:    Patient ID: [name], male    DOB: [birth year], 77 y.o.   MRN: [id number]  HPI    Review of Systems     Objective:   Physical Exam        Assessment & Plan:  No change to coumadin dose basedon INR today.

## 2010-09-06 NOTE — Assessment & Plan Note (Signed)
Summary: inr in 4 wk-vew  Nurse Visit   Primary Care Provider:  [name] MD   History of Present Illness: pt states he is having burning with urination that never stopped from the last infection  Anticoagulation Management History:      The patient is on coumadin and comes in today for a routine follow up visit.  The patient is taking Coumadin for chronic atrial fibrillation.  Plans are to keep the patient on coumadin for life.  His last INR was 3.5 and today's INR is 4.6.     Allergies: 1)  ! Penicillin 2)  ! Codeine Laboratory Results   Urine Tests  Date/Time Received: 08/27/10 Date/Time Reported: 08/27/10  Routine Urinalysis   Color: yellow Appearance: Clear Glucose: negative   (Normal Range: Negative) Bilirubin: small   (Normal Range: Negative) Ketone: trace (5)   (Normal Range: Negative) Spec. Gravity: 1.025   (Normal Range: 1.003-1.035) Blood: large   (Normal Range: Negative) pH: 6.0   (Normal Range: 5.0-8.0) Protein: >=300   (Normal Range: Negative) Urobilinogen: 0.2   (Normal Range: 0-1) Nitrite: positive   (Normal Range: Negative) Leukocyte Esterace: large   (Normal Range: Negative)     Blood Tests   Date/Time Received: 08/27/10 Date/Time Reported: 08/27/10   INR: 4.6   (Normal Range: 0.88-1.12   Therap INR: 2.0-3.5)    Orders Added: 1)  Fingerstick [36416] 2)  Protime INR [85610] 3)  UA Dipstick w/o Micro (automated)  [81003]   Anticoagulation Management Assessment/Plan:      The target INR is 2.0-3.0.  He is to have a PT/INR in 1 week.  Anticoagulation instructions were given to patient.         Current Anticoagulation Instructions: The patient's dosage of coumadin will be decreased.  The new dosage includes: The patient is to hold (do not take) the Monday dose of coumadin.  The dosage to be resumed includes: Repeat PT/INR in 1 week.  Coumadin 3 mg tabs:  Sunday - 3 mg, Monday - 1.5 mg, Tuesday - 3 mg, Wednesday - 1.5 mg, Thursday - 3  mg, Friday - 1.5 mg, Saturday - 3 mg.

## 2010-09-11 LAB — GLUCOSE, CAPILLARY
Glucose-Capillary: 114 mg/dL — ABNORMAL HIGH (ref 70–99)
Glucose-Capillary: 119 mg/dL — ABNORMAL HIGH (ref 70–99)
Glucose-Capillary: 128 mg/dL — ABNORMAL HIGH (ref 70–99)
Glucose-Capillary: 135 mg/dL — ABNORMAL HIGH (ref 70–99)
Glucose-Capillary: 143 mg/dL — ABNORMAL HIGH (ref 70–99)
Glucose-Capillary: 145 mg/dL — ABNORMAL HIGH (ref 70–99)
Glucose-Capillary: 149 mg/dL — ABNORMAL HIGH (ref 70–99)
Glucose-Capillary: 170 mg/dL — ABNORMAL HIGH (ref 70–99)
Glucose-Capillary: 86 mg/dL (ref 70–99)

## 2010-09-11 LAB — MAGNESIUM: Magnesium: 1.6 mg/dL (ref 1.5–2.5)

## 2010-09-11 LAB — CBC
HCT: 26.2 % — ABNORMAL LOW (ref 39.0–52.0)
HCT: 27 % — ABNORMAL LOW (ref 39.0–52.0)
Hemoglobin: 8.7 g/dL — ABNORMAL LOW (ref 13.0–17.0)
Hemoglobin: 9.6 g/dL — ABNORMAL LOW (ref 13.0–17.0)
MCHC: 32.8 g/dL (ref 30.0–36.0)
MCV: 95.2 fL (ref 78.0–100.0)
MCV: 96.4 fL (ref 78.0–100.0)
Platelets: 150 10*3/uL (ref 150–400)
Platelets: 156 10*3/uL (ref 150–400)
RBC: 2.76 MIL/uL — ABNORMAL LOW (ref 4.22–5.81)
RBC: 3.02 MIL/uL — ABNORMAL LOW (ref 4.22–5.81)
RDW: 14.7 % (ref 11.5–15.5)
WBC: 10.4 10*3/uL (ref 4.0–10.5)
WBC: 11.2 10*3/uL — ABNORMAL HIGH (ref 4.0–10.5)
WBC: 7.7 10*3/uL (ref 4.0–10.5)

## 2010-09-11 LAB — BASIC METABOLIC PANEL
BUN: 13 mg/dL (ref 6–23)
CO2: 25 mEq/L (ref 19–32)
CO2: 25 mEq/L (ref 19–32)
Calcium: 8.1 mg/dL — ABNORMAL LOW (ref 8.4–10.5)
Chloride: 101 mEq/L (ref 96–112)
Chloride: 102 mEq/L (ref 96–112)
Chloride: 104 mEq/L (ref 96–112)
Creatinine, Ser: 0.84 mg/dL (ref 0.4–1.5)
Creatinine, Ser: 1.09 mg/dL (ref 0.4–1.5)
Creatinine, Ser: 1.14 mg/dL (ref 0.4–1.5)
GFR calc Af Amer: >60 mL/min (ref >60)
GFR calc Af Amer: >60 mL/min (ref >60)
GFR calc Af Amer: >60 mL/min (ref >60)
GFR calc Af Amer: >60 mL/min (ref >60)
GFR calc non Af Amer: >60 mL/min (ref >60)
GFR calc non Af Amer: >60 mL/min (ref >60)
Glucose, Bld: 142 mg/dL — ABNORMAL HIGH (ref 70–99)
Potassium: 3.9 mEq/L (ref 3.5–5.1)
Potassium: 4 mEq/L (ref 3.5–5.1)
Sodium: 133 mEq/L — ABNORMAL LOW (ref 135–145)
Sodium: 137 mEq/L (ref 135–145)

## 2010-09-11 LAB — PROTIME-INR
INR: 1.39 (ref 0.00–1.49)
INR: 2.25 — ABNORMAL HIGH (ref 0.00–1.49)
INR: 2.48 — ABNORMAL HIGH (ref 0.00–1.49)
Prothrombin Time: 16.9 seconds — ABNORMAL HIGH (ref 11.6–15.2)
Prothrombin Time: 21.4 seconds — ABNORMAL HIGH (ref 11.6–15.2)
Prothrombin Time: 24.7 seconds — ABNORMAL HIGH (ref 11.6–15.2)

## 2010-09-11 LAB — URINE CULTURE
Colony Count: 45000
Colony Count: >=100000
Special Requests: NEGATIVE

## 2010-09-12 LAB — COMPREHENSIVE METABOLIC PANEL
AST: 14 U/L (ref 0–37)
Albumin: 3.6 g/dL (ref 3.5–5.2)
BUN: 12 mg/dL (ref 6–23)
Calcium: 8.9 mg/dL (ref 8.4–10.5)
Creatinine, Ser: 1.03 mg/dL (ref 0.4–1.5)
GFR calc Af Amer: >60 mL/min (ref >60)
Total Bilirubin: 0.8 mg/dL (ref 0.3–1.2)

## 2010-09-12 LAB — TYPE AND SCREEN: ABO/RH(D): O POS

## 2010-09-12 LAB — URINE MICROSCOPIC-ADD ON

## 2010-09-12 LAB — CBC
HCT: 34.8 % — ABNORMAL LOW (ref 39.0–52.0)
MCHC: 32.7 g/dL (ref 30.0–36.0)
MCV: 96.5 fL (ref 78.0–100.0)
Platelets: 221 10*3/uL (ref 150–400)
RDW: 15 % (ref 11.5–15.5)

## 2010-09-12 LAB — APTT: aPTT: 26 seconds (ref 24–37)

## 2010-09-12 LAB — URINALYSIS, ROUTINE W REFLEX MICROSCOPIC
Bilirubin Urine: NEGATIVE
Ketones, ur: NEGATIVE mg/dL
Nitrite: POSITIVE — AB
Specific Gravity, Urine: 1.015 (ref 1.005–1.030)
pH: 7 (ref 5.0–8.0)

## 2010-09-12 LAB — ABO/RH: ABO/RH(D): O POS

## 2010-09-12 LAB — PROTIME-INR: INR: 0.99 (ref 0.00–1.49)

## 2010-09-13 ENCOUNTER — Other Ambulatory Visit: Payer: Self-pay | Admitting: Family Medicine

## 2010-09-17 ENCOUNTER — Ambulatory Visit (INDEPENDENT_AMBULATORY_CARE_PROVIDER_SITE_OTHER): Payer: MEDICARE | Admitting: Family Medicine

## 2010-09-17 DIAGNOSIS — I4891 Unspecified atrial fibrillation: Secondary | ICD-10-CM

## 2010-09-17 LAB — POCT INR: INR: 2

## 2010-09-21 ENCOUNTER — Other Ambulatory Visit: Payer: Self-pay | Admitting: Family Medicine

## 2010-09-24 ENCOUNTER — Ambulatory Visit (INDEPENDENT_AMBULATORY_CARE_PROVIDER_SITE_OTHER): Payer: MEDICARE | Admitting: Family Medicine

## 2010-09-24 DIAGNOSIS — I2699 Other pulmonary embolism without acute cor pulmonale: Secondary | ICD-10-CM

## 2010-09-24 LAB — POCT INR: INR: 1.7

## 2010-10-04 ENCOUNTER — Other Ambulatory Visit: Payer: Self-pay | Admitting: Family Medicine

## 2010-10-08 ENCOUNTER — Ambulatory Visit (INDEPENDENT_AMBULATORY_CARE_PROVIDER_SITE_OTHER): Payer: MEDICARE | Admitting: Family Medicine

## 2010-10-08 DIAGNOSIS — I2699 Other pulmonary embolism without acute cor pulmonale: Secondary | ICD-10-CM

## 2010-10-15 ENCOUNTER — Other Ambulatory Visit: Payer: Self-pay | Admitting: Family Medicine

## 2010-10-16 ENCOUNTER — Telehealth: Payer: Self-pay | Admitting: Family Medicine

## 2010-10-16 NOTE — Telephone Encounter (Signed)
Pt stopped by the office and his script was sent eprescribe as Toprol XL 50mg  PO takes two pills twice daily but only #60/1 refill sent.  Pt had to pay $45.00 copay for 1/2 the amount he should have received.  Called Joe at [facility] and he will fix the script where pt only pays $45.00 for 120 count instead of #60 that he should have gotten. Plan:  Pt notified that script straightened out and he will only be charged one copay of $45.oo and he can pup rest of # 60 pills. Jarvis Newcomer, LPN [facility]

## 2010-10-22 ENCOUNTER — Ambulatory Visit (INDEPENDENT_AMBULATORY_CARE_PROVIDER_SITE_OTHER): Payer: MEDICARE | Admitting: Family Medicine

## 2010-10-22 VITALS — BP 111/59 | HR 67 | Wt 163.0 lb

## 2010-10-22 DIAGNOSIS — I2699 Other pulmonary embolism without acute cor pulmonale: Secondary | ICD-10-CM

## 2010-10-22 DIAGNOSIS — Z5181 Encounter for therapeutic drug level monitoring: Secondary | ICD-10-CM

## 2010-10-22 DIAGNOSIS — Z7901 Long term (current) use of anticoagulants: Secondary | ICD-10-CM

## 2010-10-22 LAB — POCT INR: INR: 2.2

## 2010-10-23 NOTE — Assessment & Plan Note (Signed)
[facility]                            CARDIOLOGY OFFICE NOTE   NAME:[name], [name]                       MRN:          [id number]  DATE:02/03/2007                            DOB:          [birth year]    [name] was seen in followup at the [facility] cardiology office on  February 03, 2007. He is a delightful 77 year old gentleman who was  hospitalized in February of this year with a non-ST elevation MI. He was  found to have severe multi-vessel coronary artery disease and ultimately  underwent four-vessel coronary bypass surgery by Dr. Cornelius Moras.  Postoperatively, he had atrial fibrillation and was treated with  amiodarone. This was discontinued in March. He was rehospitalized that  same month with failure to thrive and hyponatremia but has done well  since the spring.   Currently, he has no cardiovascular complaints. He specifically denies  chest pain, dyspnea, orthopnea or PND. He does complain of right lower  extremity edema which is chronic. He has no exertional symptoms. He  walks one mile every morning. His biggest problem at present is  constipation. He has recently been started on a new laxative medication  by Dr. Artis Flock. He has no other complaints today.   MEDICATIONS:  Include:  1. Prilosec 20 mg daily.  2. Altace 2.5 mg daily.  3. Lipitor 80 mg daily.  4. Zetia 10 mg daily.  5. Janumet 50/500 mg twice daily.  6. Metoprolol 50 mg daily.  7. Aspirin 325 mg daily.   PHYSICAL EXAMINATION:  The patient is alert and oriented. He is in no  acute distress. Weight is 166 pounds. Blood pressure 112/61, heart rate  67, respiratory rate 16.  HEENT:  Is normal.  NECK:  Normal. Carotid upstrokes without bruits. Jugular venous pressure  is normal.  LUNGS:  Are clear to auscultation bilaterally.  HEART:  Regular rate and rhythm without murmurs or gallops.  ABDOMEN:  Soft, nontender, no organomegaly, no abdominal bruits.  EXTREMITIES:  There is no  clubbing or cyanosis. There is 1+ edema in the  right pretibial region and no edema on the left.   EKG shows normal sinus rhythm with right bundle branch block and left  anterior fascicular block. There are no significant ST-segment or T-wave  changes.   Recent labs from Dr. Blair Heys office show a potassium of 4.8, glucose  107, creatinine 1.4, BUN 20; hemoglobin 12.9, platelet count 267,000.  Hemoglobin A1c 7.1. Lipids from May 6 showed a cholesterol of 124 with a  HDL of 39 and a LDL of 57.   ASSESSMENT:  [name] is currently stable from a cardiac standpoint.  His cardiac problems are as follows:  1. Coronary artery disease status post multi-vessel coronary bypass      surgery after presentation with a non-ST elevation myocardial      infarction. He has had no recurrent angina. He is on excellent      medical therapy. Recommended the following changes today:  Decrease      aspirin from 325 mg to 81 mg daily. Continue current dose of  metoprolol. I have advised him to increase his Altace from 2.5 to 5      mg daily and will continue to up titrate his ACE inhibitor until he      is tolerating 10 mg daily to get him to an evidence-based dose.  2. Paroxysmal atrial fibrillation. He maintains in sinus rhythm. Will      not use Coumadin unless he has recurrent atrial fibrillation. His      only episode has been in the post-bypass period.  3. Diabetes. Per Dr. Artis Flock.  4. Dyslipidemia. He has an excellent lipid panel. He desires to come      off of as much medicine as possible, and I advised him to      discontinue Zetia. I think at this point with no cardiac outcome      data that is reasonable to stop this medicine and to maintain on      high-dose Lipitor.   For followup, I would like to see Mr. Strole back in 3 months or sooner  if any new problems arise.     Veverly Fells. Excell Seltzer, MD  Electronically Signed    MDC/MedQ  DD: 02/03/2007  DT: 02/04/2007  Job #: [id number]   cc:    Quita Skye. Artis Flock, M.D.

## 2010-10-23 NOTE — Assessment & Plan Note (Signed)
[facility]                            CARDIOLOGY OFFICE NOTE   NAME:[name], [name]                       MRN:          [id number]  DATE:05/22/2007                            DOB:          02/03/1932    The patient was seen in follow-up at [facility] Cardiology office on  May 22, 2007.   The patient is a delightful 77 year old gentleman who sustained a non-ST  elevation MI in February of this year.  He ultimately underwent  multivessel coronary artery bypass graft surgery by Salvatore Decent. Cornelius Moras,  M.D.  He had postoperative atrial fibrillation treated with amiodarone.  He has had no recurrence of atrial fibrillation and has really done well  in recent months.  He has not been on Coumadin for several months.  He  denies chest pain, dyspnea, orthopnea, or PND.  The patient has been  working at a [employer] as Conservation officer, [employer].  His activity level has not  been as good as it was when he was participating in cardiac rehab.   CURRENT MEDICATIONS:  1. Metoprolol succinate 50 mg daily.  2. Altace 5 mg daily.  3. Lipitor 80 mg at bedtime.  4. Prilosec 20 mg daily.  5. Janumet 50/500 mg daily.  6. Aspirin 81 mg daily.   ALLERGIES:  PENICILLIN, CODEINE.   PHYSICAL EXAMINATION:  GENERAL:  The patient is alert and oriented,  elderly male in no acute distress.  VITAL SIGNS:  Weight 174, blood pressure 140/70, heart rate 64,  respiratory rate 16.  HEENT:  Normal.  NECK:  Normal carotid upstrokes without bruits.  Jugular venous pressure  is normal.  LUNGS:  Clear to auscultation bilaterally.  HEART:  Regular rate and rhythm without murmurs or gallops.  ABDOMEN:  Soft and nontender with no organomegaly.  EXTREMITIES:  Trace bilateral pretibial edema.   ASSESSMENT:  1. Coronary artery disease with non-ST elevation myocardial infarction      now status post coronary artery bypass graft.  Continue aggressive      secondary risk reduction with aspirin, high  dose statin, Altace and      metoprolol.  He is on a good medical regimen.  He was encouraged to      increase his activity level.  I spoke with the patient about the      TRA study which is a randomized trial looking at an oral thrombin      receptor antagonist to reduce atherothrombotic events in patients      with any myocardial infarction in the prior year.  He will speak to      our research nurses about this and consider enrollment.  2. Dyslipidemia.  He is on high dose Lipitor.  Lipids are followed by      Dr. Artis Flock.  Last panel from May of this year showed a total      cholesterol of 124, HDL 39, LDL 57, and triglycerides 409.  3. Hypertension.  Blood pressure borderline today.  Continue Altace      and metoprolol at current doses with close follow-up of blood  pressure.   FOLLOWUP:  I would like to see the patient in six months.  I would be  happy to see him sooner if he develops problems.     Veverly Fells. Excell Seltzer, MD  Electronically Signed    MDC/MedQ  DD: 05/22/2007  DT: 05/23/2007  Job #: [id number]   cc:   Quita Skye. Artis Flock, M.D.

## 2010-10-23 NOTE — Assessment & Plan Note (Signed)
[facility]                            CARDIOLOGY OFFICE NOTE   NAME:[name], Sean HARTOG                       MRN:          [id number]  DATE:12/21/2007                            DOB:          1931-06-21    [name] was seen in followup at the [facility] Cardiology office on  December 21, 2007.  He is a 77 year old gentleman who initially presented  with a non-ST-elevation MI in February 2008.  He was found to have  severe multivessel CAD and underwent bypass surgery by Dr. Cornelius Moras.  He had  postoperative atrial fib, but has had no recurrence.  He feels well  without chest pain, dyspnea, orthopnea, or PND.  His only complaint is  that his feet burn.  This is a constant feeling that he has had over  several years.  He also describes a feeling of pain in the feet.  He has  no other complaints today.   MEDICATIONS:  1. Metoprolol 50 mg daily.  2. Altace 5 mg daily.  3. Lipitor 80 mg at bedtime.  4. Janumet 50/500 mg daily.  5. Aspirin 81 mg daily.   ALLERGIES:  Include PENICILLIN and CODEINE.   PHYSICAL EXAMINATION:  GENERAL:  The patient is alert and oriented.  He  is in no acute distress.  He is an elderly gentleman.  VITAL SIGNS:  Weight is 178 pounds.  Blood pressure 138/71, heart rate  62, respiratory rate 16.  HEENT:  Normal.  NECK:  Normal carotid upstrokes without bruits.  Jugular venous pressure  is normal.  No thyromegaly or thyroid nodules.  LUNGS:  Clear to auscultation bilaterally.  HEART:  Regular rate and rhythm without murmurs or gallops.  ABDOMEN:  Soft, nontender.  No organomegaly.  No abdominal bruits.  EXTREMITIES:  Show trace edema.  Pedal pulses:  DP 1+ right, PT 1+  right, DP 1+ left, PT not palpable left.  SKIN:  Warm and dry without rash.   EKG shows sinus rhythm with left anterior hemiblock and posterior MI,  age indeterminate.   ASSESSMENT:  1. Coronary artery disease status post coronary artery bypass graft.      The  patient has no angina.  Continue current medical therapy, which      includes aspirin, angiotensin-converting enzyme inhibitor, beta      blocker, and high-dose statin.  2. Dyslipidemia.  The patient is on high-dose Lipitor.  He is followed      by Dr. Artis Flock.  Lipids from last year showed a cholesterol of 124,      HDL 39, LDL 57, triglycerides 140.  3. Diabetes.  The patient is on Janumet.  Also, followed by Dr. Artis Flock.  4. Feet burning and pain.  Question peripheral neuropathy.  We will      check ankle-brachial indexes for screening because he does have an      abnormal pulse exam.  Symptoms are not typical for claudication.   For followup, I would like to see [name] back in 6 months.  I am  pleased with how well he is doing and  he certainly is stable from a  cardiac standpoint.     Veverly Fells. Excell Seltzer, MD  Electronically Signed    MDC/MedQ  DD: 12/21/2007  DT: 12/22/2007  Job #: [id number]   cc:   Quita Skye. Artis Flock, M.D.

## 2010-10-23 NOTE — Letter (Signed)
November 13, 2006    Quita Skye. Artis Flock, M.D.  [address], [address]  [place], [place] [address]   RE:  [name], [name]  MRN:  [id number]  /  DOB:  November 20, 1931   Dear Rosanne Ashing:   It was a pleasure to see your nice patient, [name] for followup on  November 13, 2006.  As you know, he is a very pleasant 77 year old white  married male with a history of paroxysmal atrial fibrillation with no  symptoms recently.  He presented to the hospital on August 04, 2006  with non-ST elevation MI.  Catheterization revealed severe 3-vessel  coronary artery disease.  He subsequently underwent CABG x4 by Dr.  Tressie Stalker with anastomosis of the LIMA to the LAD, SVG to the 3rd  diagonal, SVG to the 3rd circumflex marginal, and SVG to posterior  lateral branch.  Patient had some postop atrial fibrillation, and was on  amiodarone briefly.  He presented back to the hospital with failure to  thrive on August 15, 2006, and had evidence of acute on chronic renal  disease, hyponatremia, and  had a urinary tract infection.  He also has  diabetes and hyperlipidemia.   I saw him in the office on August 19, 2006, and he was in normal sinus  rhythm, and feeling much better.  He has continued to improve.   Holter monitor done on Oct 20, 2006 revealed normal sinus rhythm and  sinus bradycardia.  There was no atrial fibrillation.  There were  frequent PACs with rare couplet, and a few PVCs.   He is on:  1. Prilosec 20.  2. Altace 2.5.  3. Lipitor 80 with LDL of 57.  4. Zetia 10.  5. Janumet 50/500 b.i.d.  6. Metoprolol 50.  7. Aspirin 325.   Recent abdominal ultrasound revealed 3.4 cm x 3.3 cm aortic aneurysm  with bilateral common iliac aneurysms less than 2 cm.   Blood pressure 150/73.  Pulse 63.  Normal sinus rhythm.  GENERAL APPEARANCE:  Normal.  JVP is not elevated.  Carotid pulses palpated with bruits.  LUNGS:  Clear.  CARDIAC EXAM:  Reveals no murmur or gallop.  ABDOMINAL EXAM:  Reveals some fullness,  but none on palpation.  EXTREMITIES:  Reveal trace swelling on the right.  He has some  varicosities, and he also __________  where his vein was harvested.   DIAGNOSES:  As above, including the abdominal aneurysm, which is a new  diagnosis.   I have not suggested Coumadin at this time, though certainly if he has  any recurrent atrial fibrillation, he should be started on Coumadin.  I  should note that he continues to be in normal sinus rhythm, left  anterior hemiblock, and right bundle branch block.   Thank you for the opportunity to share in this nice gentleman's care.  I  have suggested that he see Dr. Calton Dach for followup in 6 to 8 weeks.    Sincerely,      E. Graceann Congress, MD, [facility]  Electronically Signed    EJL/MedQ  DD: 11/13/2006  DT: 11/13/2006  Job #: [id number]

## 2010-10-24 ENCOUNTER — Other Ambulatory Visit: Payer: Self-pay | Admitting: Family Medicine

## 2010-10-26 NOTE — Op Note (Signed)
NAMEWILLYS, [name] NO.:  [id number]   MEDICAL RECORD NO.:  [id number]          PATIENT TYPE:  OBV   LOCATION:  1431                         FACILITY:  [facility]   PHYSICIAN:  Valetta Fuller, M.D.  DATE OF BIRTH:  05-04-1932   DATE OF PROCEDURE:  12/17/2004  DATE OF DISCHARGE:                                 OPERATIVE REPORT   PREOPERATIVE DIAGNOSES:  1.  Urinary retention.  2.  Bladder neck obstruction/benign prostatic hypertrophy.   POSTOPERATIVE DIAGNOSES:  1.  Urinary retention.  2.  Bladder neck obstruction/benign prostatic hypertrophy.   PROCEDURE PERFORMED:  Cystoscopy, transurethral resection of the prostate  and insertion of suprapubic tube.   SURGEON:  Valetta Fuller, M.D.   ANESTHESIA:  General.   INDICATIONS:  [name] is a 77 year old male.  I saw him initially in the  middle of May as a hospital consult because of urinary retention.  The  patient had had some longstanding voiding symptoms prior to surgery on his  lumbar spine by Dr. Jeral Fruit.  The patient also had a colonic perforation due  to ischemia after his back surgery and had a complicated hospital course.  When we saw him in followup, he failed several voiding trials, despite  maximal medical therapy without alpha blockers and Urecholine.  Evaluation  in the office did reveal some bladder neck obstruction and visual  obstruction, but we also felt the patient did have a significant component  of an atonic hypocontractile bladder.  The patient most recently failed a  voiding trial on alpha blocker plus the Urecholine.  We discussed several  options and thought surgical treatment to eliminate any component of  obstruction along with the suprapubic tube in case the patient was still  unable to void would be probably a reasonable course of action.  The patient  appeared to understand the advantages and disadvantages, potential risks and  complications of this approach.   TECHNIQUE AND  FINDINGS:  The patient was brought to the operating room,  where he had successful induction of general anesthesia.  He was placed in  lithotomy position and prepped and draped in the usual manner.  Initial  cystoscopy again revealed a relatively short prostate measuring about 3 cm.  He had some lateral lobe tissue and a fairly prominent median bar causing  visual obstruction.  The bladder itself showed mild thickening and  trabecular change without anything else really significant.  The patient did  have some mucosal edema and erythema consistent with a chronic indwelling  Foley catheter.  Of note, the patient did receive parenteral antibiotic  therapy with gentamicin as well as some Ancef prior to the procedure.  The  cystoscope was then removed and a 28-French continuous-flow resectoscope was  inserted.  We initiated the resection by taking down the bladder neck to the  capsular fibers and smoothing the bladder neck even with the prostatic  fossa.  Ureteral orifices were easy to identify and preserved throughout  this resection.  We then began on the right lobe of the prostate anteriorly,  taking down the prostate  tissue to capsular fibers and coming out anteriorly  to close the verumontanum, but purposely leaving a little bit of the apical  tissue behind.  The left lobe was taken down in a similar manner.  Approximately 15 g of prostate were resected.  Hemostasis remained quite  good and the blood loss was minimal.  At the completion of the resection,  the fossa was wide open and the bladder neck was also wide open.  Prostate  chips were removed and sent for pathologic analysis.  We then used a Lowsley  retractor to tent up the anterior wall of the bladder.  A 22-French  suprapubic tube was then placed in the standard manner and endoscopically we  confirmed good positioning.  This was secured to the anterior abdominal wall  and plugged.  In the urethra, a 22-French Foley was placed.   Urine was very  minimally pink.  The patient appeared to tolerate the procedure well, there  were no obvious complications and he was brought to recovery room in stable  condition.       DSG/MEDQ  D:  12/17/2004  T:  12/18/2004  Job:  [id number]

## 2010-10-26 NOTE — Discharge Summary (Signed)
NAMEDELSHON, BLANCHFIELD                ACCOUNT NO.:  [id number]   MEDICAL RECORD NO.:  [id number]          PATIENT TYPE:  INP   LOCATION:  5736                         FACILITY:  [facility]   PHYSICIAN:  Vikki Ports, MDDATE OF BIRTH:  03-11-1932   DATE OF ADMISSION:  10/05/2004  DATE OF DISCHARGE:  10/25/2004                                 DISCHARGE SUMMARY   DISCHARGE DIAGNOSES:  1. Colectomy secondary to ischemic colon.  2. MRSA abdominal infection      treated with IV vancomycin.  3. Urinary retention, Foley as per Dr.      Isabel Caprice with followup with followup with him within the next week.  4.      Postoperative ileus, resolved.  5. Degenerative disk disease with an L4-      L5 herniated disk on the right as well as a right L5 radiculopathy      status post bilateral L4-L5 laminectomy, right L4-L5 diskectomy, left      L4-L5 foraminotomy as well as a repair of dura matter by Dr. Jeral Fruit.      6. History of hypertension treated.  7. Benign prostatic hypertrophy.      8. Status post umbilical hernia repair x2.  9. Right inguinal hernia      repair.  10. Allergies to PENICILLIN AND CODEINE.     HOSPITAL COURSE:  Mr. Trombly is a 77 year old male patient who was initially  admitted for degenerative disk disease with subsequent surgery by Dr.  Jeral Fruit.  Postoperatively he developed an ileus and we were called for  consultation.  Ultimately he did develop an ischemic right colon with a  contained perforation and he underwent an exploratory laparotomy with a  right colectomy under Dr. Kem Kays care.  Over the next several days  postoperatively he developed a fever and was additionally diagnosed with an  MRSA abdominal infection.  He was treated aggressively with IV vancomycin  and ultimately was discharged to home on postoperative day #10 from his  colectomy.   Complications during the patient's hospital stay also included urinary  retention and he did require Foley placement by  Dr. Isabel Caprice and he will be  seen next week in his office for hopeful removal.   The patient will be discharged to home on the following medications: Flomax  twice daily, Ultram as needed for pain which she already has at home,  Lipitor daily, Zetia daily, Digitek 0.125 mg to take two daily, Neurontin  daily as before, Zantac 150 mg twice a day for his stomach, vancomycin per  home health, Percocet 1-2 tablet every four to six hours as needed for pain.   No lifting over 10 pounds for one month.  Remain on a low-fat diet.  Wound  care will be performed by home health.  He is to call for a temperature  greater than 101.  He is to have a followup appointment with Dr. Gerrit Friends on  11/06/04 at 10 a.m.   Apparently he has been on Coumadin in the past for fluttering and at this  point we elect not to restart  this and he will be seeing [facility] [place] in  approximately one week.  At that time the decision can be made whether or  not he is resuming Coumadin.      LB/MEDQ  D:  10/25/2004  T:  10/25/2004  Job:  [id number]   cc:   Dr. Olevia Bowens, M.D.  [address] [place], [place] [address]  Fax: [phone]   Velora Heckler, MD  [phone]  [place]  [place] [address]   E. Graceann Congress, M.D.

## 2010-10-26 NOTE — Assessment & Plan Note (Signed)
[facility]                            CARDIOLOGY OFFICE NOTE   NAME:[name] [name] FERNS                       MRN:          [id number]  DATE:08/19/2006                            DOB:          11-04-1931    This is a patient of Dr. Glennon [name].   PRIMARY CARE PHYSICIAN:  Dr. Artis [name].   [name] [name] is a pleasant 78 year old married white male patient who  presented to the hospital with non-ST elevation MI on August 04, 2006,  and then underwent urgent CABG x4 with a LIMA to the LAD, SVG to the 3rd  diagonal, SVG to the 3rd circumflex marginal and a sequential SVG to the  left posterolateral branch.  Ejection fraction on cath was 50%.  Patient  does have a history of paroxysmal atrial fibrillation but when he came  into the hospital he was in normal sinus rhythm.  Postop he went into  atrial fibrillation and was placed on amiodarone, as well as his Toprol,  which he has been taking.  He was discharged on August 10, 2006 but  readmitted on August 15, 2006 for 2 days because of failure to thrive and  a urinary tract infection and hyponatremia.  The patient had poor oral  intake secondary to mild glossitis and did much better after IV fluid  hydration and his mouth sores have improved.  The patient was also  started on Cipro for a presumed UTI.   Today in the office the patient looks quite well and says he is feeling  well.  He is eating much better.  He still has some sores in his mouth,  but overall he feels great.  He denies any chest pain, shortness of  breath, dizziness, palpitations or presyncope.   Patient did notice some bright red blood per rectum twice in the past 3  days and his wife held his aspirin today and asked if he could take a  baby aspirin.   CURRENT MEDICATIONS:  1. Aspirin 325 mg.  He was taking it daily but she held it today.  2. Cipro 800 mg b.i.d.  3. Prilosec 20 mg daily.  4. Altace 2.5 mg daily.  5. Lipitor 80 mg daily.  6.  Zetia 10 mg daily.  7. Amiodarone 200 mg b.i.d.  8. Oxybutynin 5 mg t.i.d.  9. Janumet 50/500 b.i.d.  10.Metoprolol 50 mg daily.   PHYSICAL EXAMINATION:  This is a very pleasant, 77 year old white male  in no acute distress.  Blood pressure 122/70, pulse 54, weight 153.  NECK:  Without JVD, HJR, bruit or thyroid enlargement.  LUNGS:  Clear anterior, posterior and lateral.  HEART:  Regular rate and rhythm at 50 beats per minute.  Normal S1 and  S2 with a 1 to 2/6 systolic ejection murmur at the left sternal border  and apex.  ABDOMEN:  Soft without organomegaly, masses, lesions or abnormal  tenderness.  Incisions are healing well.  EXTREMITIES:  Traces of edema in the right ankle.  Good distal pulses.   EKG:  Sinus bradycardia with right bundle branch block and left  anterior  fascicular block, bifascicular block and old septal infarct.   IMPRESSION:  1. Coronary artery disease status post non-ST segment elevation      myocardial infarction on August 04, 2006, followed by urgent      coronary artery bypass grafting x4, with a left internal mammary      artery to the left anterior descending, saphenous vein graft to the      3rd diagonal, saphenous vein graft to the 3rd circumflex with      sequential saphenous vein graft to the left posterolateral artery.  2. Ejection fraction 50%.  3. History of paroxysmal atrial fibrillation with postop atrial      fibrillation.  Now with sinus bradycardia and bifascicular block,      on amiodarone and Toprol.  4. Readmission to the hospital with failure to thrive due to mouth      ulcers, poor oral intake and dehydration, and bladder infection,      resolved.  5. Diabetes mellitus type 2.  6. Hyperlipidemia.  7. History of prostate cancer, status post prostatectomy with      radiation therapy.  8. History of bladder neck contracture with neurogenic bladder,      requiring chronic indwelling suprapubic catheter.  9. Degenerative joint  disease of the lumbosacral spine.  10.History of methicillin-resistant Staphylococcus aureus following      laparotomy in May 2006.  11.History of right colectomy for ischemic bowel, secondary to ileus      with ischemic colitis following lumbar laminectomy and diskectomy      in May 2006.  12.Bright red blood per rectum.   PLAN:  Overall, patient is doing quite well.  I discussed his  bradycardia and bifascicular block with Dr. Graciela [name], who recommends  stopping his amiodarone at this time and continuing on Toprol.  Hopefully, he will maintain sinus rhythm.  I have also told him it was  okay to decrease his aspirin  to a baby aspirin, and follow up with Dr. Cornelius [name] tomorrow concerning this.  Overall, he is doing quite well.  We will have him see Dr. Corinda [name] back  in 1 month or sooner if needed.      [name] Reedy, PA-C  Electronically Signed      [name] Rotunda, MD, [facility]  Electronically Signed   ML/MedQ  DD: 08/19/2006  DT: 08/19/2006  Job #: [id number]   cc:   [name] Cranker, MD, [facility]  Quita Skye. [name] [name], M.D.  Salvatore Decent. [name] [name], M.D.

## 2010-10-26 NOTE — Op Note (Signed)
NAMEBURNICE, [name] NO.:  [id number]   MEDICAL RECORD NO.:  [id number]          PATIENT TYPE:  INP   LOCATION:  2316                         FACILITY:  [facility]   PHYSICIAN:  Salvatore Decent. Cornelius Moras, M.D. DATE OF BIRTH:  [birth year]   DATE OF PROCEDURE:  08/05/2006  DATE OF DISCHARGE:                               OPERATIVE REPORT   PREOPERATIVE DIAGNOSIS:  Three vessel coronary artery disease with class  IV unstable angina.   POSTOPERATIVE DIAGNOSIS:  Three vessel coronary artery disease with  class IV unstable angina.   PROCEDURE:  Median sternotomy for coronary artery bypass grafting x4  (left internal mammary artery to distal left anterior descending  coronary artery, saphenous vein graft to third diagonal branch,  saphenous vein graft to third circumflex marginal branch with sequential  saphenous vein graft to left posterolateral branch, endoscopic saphenous  vein harvest from right thigh and right lower leg).   SURGEON:  Salvatore Decent. Cornelius Moras, M.D.   ASSISTANT:  Sheliah Plane, M.D.   SECOND ASSISTANT:  Zadie Rhine, P.A.-C.   ANESTHESIA:  General.   BRIEF CLINICAL NOTE:  The patient is a 77 year old gentleman from  [place] with multiple medical problems but no previous history of  coronary artery disease.  The patient presents with new onset symptoms  of substernal chest pain consistent with unstable angina.  He was  admitted to the hospital and ruled in for non-ST-segment elevation  myocardial infarction based on serial cardiac enzymes.  Cardiac  catheterization performed by Dr. Charlies Constable demonstrates critical  three vessel coronary artery disease with mild left ventricular  dysfunction.  A full consultation note has been dictated previously.  The patient and his family have been counseled at length regarding the  indications, risks, and potential benefits of surgery.  Alternative  treatment strategies have been discussed.  They understand and  accept  all associated risks of surgery and desire to proceed as described.   OPERATIVE FINDINGS:  1. Mild left ventricular dysfunction.  2. Mild left ventricular hypertrophy.  3. Good quality left internal mammary artery and saphenous vein      conduit for grafting.  4. Good quality target vessels for grafting   OPERATIVE NOTE IN DETAIL:  The patient was brought to the operating room  on the above mentioned date and central monitoring was established by  the anesthesia service under the care and direction of Dr. Arta Bruce.  Specifically, a Swan-Ganz catheter was placed through the right internal  jugular approach.  A radial arterial line was placed.  Intravenous  antibiotics were administered.  Following induction with general  endotracheal anesthesia, a Foley catheter is placed.  The patient's  chest, abdomen, both groins, and both lower extremities are prepared and  draped in a sterile manner.  Baseline transesophageal echocardiogram was  performed by Dr. Michelle Piper.  This demonstrates mild left ventricular  dysfunction.  There is trivial aortic insufficiency and trivial mitral  regurgitation.   A median sternotomy incision is performed and the left internal mammary  artery is dissected from the chest wall and prepared for bypass  grafting.  The left internal mammary artery was notably a good quality  conduit.  Simultaneously, saphenous vein was obtained from the patient's  right thigh and the upper portion of the right lower leg using  endoscopic vein harvest technique.  The saphenous vein is a good quality  conduit.  After the saphenous vein is removed, the small surgical  incisions in the right lower extremity are closed in multiple layers  with running absorbable suture.  The patient is heparinized  systemically.  The left internal mammary artery is transected distally  and is noted to have excellent flow.   The pericardium was opened.  The ascending aorta is normal in   appearance.  The ascending aorta and the right atrium were cannulated  for cardiopulmonary bypass.  Adequate heparinization is verified.  Cardiopulmonary bypass is begun and the surface of the heart was  inspected.  Distal sites are selected for coronary bypass grafting.  A  temperature probe is placed in the left ventricular septum and a  cardioplegic catheter was placed in the ascending aorta.   The patient is allowed to cool passively to 32 degrees systemic  temperature.  The aortic crossclamp was applied and cold blood  cardioplegia is administered in an antegrade fashion through the aortic  root.  Iced saline slush was applied for topical hypothermia.  The  initial cardioplegic arrest and myocardial cooling are felt to be  excellent.  Repeat doses of cardioplegia are administered intermittently  throughout the crossclamp portion of the operation through the aortic  root and down the subsequently placed vein grafts to maintain left  ventricular septal temperature below 15 degrees centigrade.   The following distal coronary anastomoses were performed:  1. The third circumflex marginal branch is grafted with a saphenous      vein graft in a side-to-side fashion.  This vessel measured 1.7 mm      in diameter and is a good quality target vessel for grafting.  2. The posterolateral branch of the distal left circumflex coronary      artery is grafted using the sequential saphenous vein graft off of      the vein placed to the circumflex marginal branch.  This vessel      measures 2 mm in diameter and is a good quality target vessel for      grafting.  3. The third diagonal branch off the left anterior descending coronary      artery is grafted with a saphenous vein graft in an end-to-side      fashion.  This vessel measured 1.5 mm in diameter and is a good      quality target vessel for grafting.  4. The distal left anterior descending coronary artery is grafted with     left internal  mammary artery in an end-to-side fashion.  This      vessel measures 1.5 mm in diameter and is a good quality target      vessel for grafting.   The left atrial appendage is amputated using the ATW endoscopic stapling  device and the staple line is oversewn with Prolene suture due to the  patient's history of paroxysmal atrial fibrillation in the distant past.   Both proximal saphenous vein anastomoses were performed directly to the  ascending aorta prior to removal of the aortic crossclamp.  Left  ventricular septal temperature rises rapidly with reperfusion of the  left internal mammary artery.  The aortic crossclamp was removed after a  total crossclamp time of  59 minutes.  The heart begins to beat  spontaneously without need for cardioversion.  Epicardial pacing wires  were fixed to the right ventricular outflow tract and to the right  atrial appendage.  All proximal and distal anastomoses are inspected for  hemostasis and appropriate graft orientation.  The patient is weaned  from cardiopulmonary bypass without difficulty.  The patient's rhythm at  separation from bypass is a slow junctional escape rhythm.  AV  sequential pacing was employed.  Total cardiopulmonary bypass time for  the operation is 82 minutes.  No inotropic support is required.   The venous and arterial cannulae are removed uneventfully.  Protamine is  administered to reverse the anticoagulation.  The mediastinum and the  left chest are irrigated with saline solution containing vancomycin.  Meticulous surgical hemostasis is ascertained.  The mediastinum and left  chest are drained with three chest tubes exited through separate stab  incisions inferiorly.  The soft tissues anterior to the sternum are  closed in multiple layers.  The sternum was closed with double-strength  sternal wire.  The soft tissues anterior to the sternum are closed in  multiple layers and the skin is closed with a running subcuticular skin   closure.   The patient tolerated the procedure well and was transported to the  surgical intensive care unit in stable condition.  There were no  interoperative complications.  All sponge, instrument and needle counts  were verified correct at the completion of the operation.  The patient  was transfused 2 units packed red blood cells during cardiopulmonary  bypass due to anemia which was present preoperatively and exacerbated by  hemodilution with surgery.      Salvatore Decent. Cornelius Moras, M.D.  Electronically Signed     CHO/MEDQ  D:  08/05/2006  T:  08/05/2006  Job:  [id number]   cc:   Everardo Beals. Juanda Chance, MD, [facility]  Cecil Cranker, MD, [facility]  Quita Skye. Artis Flock, M.D.

## 2010-10-26 NOTE — Discharge Summary (Signed)
[name], [name]                ACCOUNT NO.:  [id number]   MEDICAL RECORD NO.:  [id number]          PATIENT TYPE:  INP   LOCATION:  1614                         FACILITY:  [facility]   PHYSICIAN:  Velora Heckler, MD      DATE OF BIRTH:  01-13-1932   DATE OF ADMISSION:  03/25/2006  DATE OF DISCHARGE:  03/27/2006                                 DISCHARGE SUMMARY   REASON FOR ADMISSION:  Recurrent ventral incisional hernia.   HISTORY OF PRESENT ILLNESS:  [name] is a pleasant 77 year old white  male who presented with ventral incisional hernia.  He has a history of MRSA  infection.  He has a permanent suprapubic tube in place.  He presents now  for repair of recurrent ventral incisional hernia.   HOSPITAL COURSE:  Patient was admitted on October 16th.  He was taken  directly to the operating room, where he underwent repair of ventral  incisional hernia, using bilateral rectus advancement flaps and MTF  allograft.  Postoperatively, the patient did well.  He was instructed in  wound and drain care.  He tolerated clear liquids and advanced to a regular  diet.  He became ambulatory.  He received intravenous antibiotics with  Cipro.  He is prepared for discharge home on the second postoperative day.   DISCHARGE PLANNING:  Patient is discharged home today, March 27, 2006, in  good condition, tolerating a regular diet and ambulating independently.  Patient will be seen back in my office at [facility] in 3-5  days for wound check and drain removal.   DISCHARGE MEDICATIONS:  Vicodin as needed for pain and Cipro 500 mg b.i.d.  x7 days.   FINAL DIAGNOSES:  Recurrent ventral incisional hernia.   CONDITION ON DISCHARGE:  Good.      Velora Heckler, MD  Electronically Signed     TMG/MEDQ  D:  03/27/2006  T:  03/29/2006  Job:  [id number]   cc:   Quita Skye. Artis Flock, M.D.  Fax: [phone]   Valetta Fuller, M.D.  Fax: [phone]

## 2010-10-26 NOTE — Op Note (Signed)
NAMESTORM, SOVINE NO.:  [id number]   MEDICAL RECORD NO.:  [id number]          PATIENT TYPE:  INP   LOCATION:  3314                         FACILITY:  [facility]   PHYSICIAN:  Velora Heckler, MD      DATE OF BIRTH:  01-Nov-1931   DATE OF PROCEDURE:  10/14/2004  DATE OF DISCHARGE:                                 OPERATIVE REPORT   PREOPERATIVE DIAGNOSIS:  Ischemic right colon.   POSTOPERATIVE DIAGNOSIS:  Ischemic right colon with a contained perforation.   PROCEDURE:  Exploratory laparotomy with right colectomy.   SURGEON:  Velora Heckler, M.D.   ASSISTANT:  Ovidio Kin, M.D.   ANESTHESIA:  General.   ESTIMATED BLOOD LOSS:  50 mL.   PREPARATION:  Betadine.   COMPLICATIONS:  None.   INDICATIONS:  The patient is a 77 year old white male initially admitted by  Hilda Lias, M.D. for a spine procedure. Postoperatively he developed  ileus. He was found on radiographic studies to have a cecal bascule. He  developed cecal distension and required decompressive colonoscopy. However  symptoms recurred and the patient developed abdominal pain, leukocytosis and  fever. On assessment he had an acute abdomen. He was urgently prepared and  brought to the operating room for exploration.   PROCEDURES PERFORMED:  The procedure is done in OR #60 at [facility]. The patient is brought to the operating room, placed in  supine position on the operating room table. Following administration of  general anesthesia the patient is prepped and draped in the usual strict  aseptic fashion. After ascertaining that an adequate level of anesthesia  been obtained, a midline abdominal incision was made with #10 blade.  Dissection was carried down through subcutaneous tissues to the fascia.  Fascia is incised in the midline. The peritoneal cavity was entered  cautiously. There was a small amount of fluid present within the peritoneal  cavity. This was evacuated.  Adhesions were taken down from the anterior  abdominal wall with the electrocautery. Right colon was markedly distended.  It is thickened. It has inflammatory changes as well as ischemic changes  present. The bowel is sealed against the transverse colon mesentery. Upon  taking down the adhesions it is clear that there was a contained perforation  of the cecum against the transverse mesocolon. The defect measures  approximately 1 cm in diameter. The surrounding colon wall was quite thin. A  2-0 silk figure-of-eight suture was used to close the defect and prevent  additional spillage of colonic contents into the peritoneal cavity.   Decision was made to proceed with right colectomy. Inflammatory changes  appeared to involve only the right colon. Proximal transverse colon was  slightly involved but the mid transverse colon appears grossly normal. There  is no evidence of abscess. Small bowel adhesions were taken down with the  electrocautery. The terminal ileum was transected with a GIA stapler.  Mesentery is mobilized by incising the peritoneum on both sides. Mesentery  was then divided between [facility] clamps and ligated with 2-0 silk ties.  Dissection was carried up  and around the hepatic flexure. Hepatic flexure  peritoneal leaflets are taken down the electrocautery. Mesenteric dissection  was carried over to the midtransverse colon, again dividing vessels between  Kelly clamps and ligating with 2-0 silk ties. A point in the mid transverse  colon which appears grossly normal was then selected and transected with a  GIA stapler. The right colon specimen was then passed off the field. Abdomen  was copiously irrigated with warm saline which was evacuated. No other  contamination was identified. Good hemostasis was noted. Next a side-to-side  functionally end-to-end anastomosis was created between the terminal ileum  and the transverse colon. This was performed with a GIA stapler. Staple line   was inspected for hemostasis. Enterotomy was closed with a TA60 stapler.  Good hemostasis was noted along the staple lines. Mesenteric defect was  closed with interrupted 2-0 silk sutures. Abdomen was again irrigated with  saline which was evacuated. Anastomosis was covered with the omentum.  Midline incision was then closed with interrupted #1 Novofil figure-of-eight  sutures. Good hemostasis was noted in the subcutaneous tissues. Tissues  irrigated with warm saline. Skin was closed with stainless steel staples.  Sterile dressings were applied. The patient was awakened from anesthesia and  brought to the recovery room in stable condition. The patient tolerated the  procedure well.      TMG/MEDQ  D:  10/14/2004  T:  10/15/2004  Job:  [id number]   cc:   Hilda Lias, M.D.  [address]  [place], [place] [address]  Fax: [phone]

## 2010-10-26 NOTE — Consult Note (Signed)
NAMEFAWAZ, BORQUEZ NO.:  [id number]   MEDICAL RECORD NO.:  [id number]          PATIENT TYPE:  INP   LOCATION:  3014                         FACILITY:  [facility]   PHYSICIAN:  Iva Boop, M.D. LHCDATE OF BIRTH:  09/27/1931   DATE OF CONSULTATION:  10/09/2004  DATE OF DISCHARGE:                                   CONSULTATION   REFERRING PHYSICIAN:  Angelia Mould. Derrell Lolling, M.D.   NEUROSURGEON:  Hilda Lias, M.D.   PRIMARY CARE PHYSICIAN:  Hulan Saas, M.D.   REASON FOR CONSULTATION:  Postoperative ileus with dilated cecum.   HISTORY OF PRESENT ILLNESS:  This is a 77 year old white man that underwent  a lumbar spine surgery on October 06, 2003.  He had had some difficulty with  defecation and constipation with narcotics prior to his surgery and since  then has developed an ileus with marked distention in the cecum up to 16.3  cm yesterday with an accompanying diffuse colonic ileus.  A rectal tube was  placed in the cecum and was down to about 14-15 cm with some improvement in  his ileus though he still remains very distended and tender on exam.  He has  not had fever that I can tell.  The rectal tube allowed him to pass a little  bit of air and he has had some stool output but prior to that he has been on  magnesium citrate, suppositories and enemas without significant relief.  He  has not been vomiting and has not had nausea.  He had an NG tube placed  today just a little while ago.  His most recent films today indicate no free  air but that left lateral decubitus film was recommended to rule that out.   ALLERGIES:  PENICILLIN AND CODEINE.   CURRENT MEDICATIONS:  1.  Colace 100 mg b.i.d.  2.  Metoprolol 50 mg daily.  3.  Neurontin 300 mg t.i.d.  4.  Zetia 10 mg daily.  5.  Flomax 0.4 mg b.i.d.  6.  Lanoxin 0.25 mg daily.  7.  Heparin 5000 units every eight hours subcutaneously.  8.  Albuterol 2.5 mg every six hours.  9.  Lipitor 10 mg at 1800 hours.  10. Metoclopramide 10 mg IV q.6h.  11. Morphine was stopped today by Dr. Derrell Lolling.   PAST MEDICAL HISTORY:  1.  Benign prostatic hypertrophy.  2.  Umbilical hernia repair x2, last time with mesh.  3.  Inguinal hernia repair x1 with mesh.  4.  Atrial fibrillation, preoperative clearance by Dr. Glennon Hamilton with a      low-risk Myoview study.  The plan is to restart Coumadin as soon as      possible post surgery.  5.  Dyslipidemia.  6.  Obesity.  7.  Status post lumbar spine surgery as above.   SOCIAL HISTORY:  Lives in [place] with his wife (#2).  No alcohol.  Tobacco for many years.   FAMILY HISTORY:  No colon cancer, GI bleeding, ulcer disease.   REVIEW OF SYSTEMS:  He is not having any chest pain, he  had some epistaxis  with the NG tube insertion and that is bothering him a little bit now, i.e.,  pain from that but no more bleeding.  No visual disturbance, no headache.  He has had some fever T-max 101.2 yesterday overnight, temperature 99 today.   PHYSICAL EXAMINATION:  GENERAL:  A pleasant elderly gentleman in no acute  distress other than distended abdomen.  VITAL SIGNS:  Temperature 99, pulse 74, respirations 20, blood pressure  135/83, 93% saturation on three liters O2.  HEENT:  Eyes:  Anicteric.  NG tube was in place with some hem around the  nares.  NECK:  Supple.  CHEST:  Clear anteriorly.  HEART:  Irregularly irregular with no murmur.  ABDOMEN:  Distended, intense, bowel sounds are present, somewhat high-  pitched.  He has some upper abdominal tenderness on both sides.  There is no  obvious rebound to my exam.  I do not detect any obvious guarding but he is  tender particularly on the right side.  The rectal tube is in place with  brown liquid stool.  NEUROLOGIC:  He is alert and oriented x3.   LABORATORY DATA:  Hemoglobin 11.4, white count 9.6, BMET normal except for  glucose 201, albumin 2.6, potassium 8.1.   ASSESSMENT:  Postoperative ileus with marked cecal  distention.  He is at  risk for perforation.  He has some abdominal pain which I think is from the  distention though we have not ruled out peritonitis or perforation at this  point. The normal white count goes against that.  I am not sure where the  fever has come from at this point.  It is better today, there are obviously  multiple postoperative causes.  At this point based upon his physical exam  he does not have obvious peritonitis.   RECOMMENDATIONS AND PLAN:  If there is no free air on the left lateral  decubitus film I think it would be reasonable to pursue a decompressive  colonoscopy.  I have explained this to the patient and he and his wife  understand the risks include perforation of the colon from this but he is at  risk for perforation otherwise as well.   I appreciate the opportunity to care for this patient.      CEG/MEDQ  D:  10/09/2004  T:  10/09/2004  Job:  [id number]   cc:   Angelia Mould. Derrell Lolling, M.D.  [address]  [place]  [place] [address]   Hilda Lias, M.D.  [address]  [place], [place] [address]  Fax: [phone]   Hulan Saas, M.D.

## 2010-10-26 NOTE — Consult Note (Signed)
NAMEJERMANY, RIMEL NO.:  [id number]   MEDICAL RECORD NO.:  [id number]          PATIENT TYPE:  INP   LOCATION:                               FACILITY:  [facility]   PHYSICIAN:  Valetta Fuller, M.D.  DATE OF BIRTH:  [birth year]   DATE OF CONSULTATION:  10/24/2004  DATE OF DISCHARGE:  10/25/2004                                   CONSULTATION   REASON FOR CONSULTATION:  Ongoing urinary retention.   HISTORY OF PRESENT ILLNESS:  Mr. Deharo is a very pleasant 77 year old male  who has had quite a complex recent hospitalization. In summary, the patient  underwent thorough lumbar spine surgery several weeks ago. This was done by  Dr. Hilda Lias. The patient's postoperative course was complicated by  development of what was felt to be an ileus. The patient failed conservative  therapy with decompressive colonoscopy and because of increasing abdominal  pain and leukocytosis the patient had exploration for an acute abdomen. The  patient was found to have an ischemic right colon with a contained  perforation. Since that time, he has had ongoing urinary retention. The  patient has no real documented urologic history but admits that he has had  fairly longstanding and severe obstructive and irritative voiding symptoms  preoperatively. These symptoms have been progressive for at least a year. It  appears his primary care doctor started him on Flomax and subsequently  increased the dose to 0.8 milligrams a day. The patient is never really  found the Flomax to be terribly beneficial to him and he is continued to  have severe voiding complaints. Preoperatively, the patient was voiding 5 to  8 times a night. He had severe frequency with urgency, a lot of hesitancy,  and a fairly weak urinary stream. There have been no apparent problems  performing I&O catheterization. The patient has had a Foley in and out  during this hospitalization. He has most recently had several in and out  catheterizations and has had postvoid residuals at times in excess of 1000  cc. The patient denies any documented history of prostatitis or prostate  cancer. He tells me that he was planning on trying to get in with the  urologist before all these other events occurred.   PAST MEDICAL HISTORY:  Significant for hypertension and hyperlipidemia. The  patient has also had history of atrial fibrillation and has previously  apparently been on anticoagulation.   MEDICATIONS:  His medications include metoprolol, Neurontin, Zetia, Flomax  0.4 milligrams b.i.d., Lanoxin, subcutaneous heparin, albuterol, Lipitor,  and metoclopramide, and pain medication.   ALLERGIES:  He has allergies to PENICILLIN and CODEINE.   SOCIAL HISTORY:  The patient is a longstanding history of tobacco but denies  alcohol use. There is no definitive family history for prostate cancer. His  review of systems is primarily significant for the voiding complaints. The  rest of his review of systems is otherwise unremarkable other than obvious  abdominal pain from his recent surgery.   PHYSICAL EXAMINATION:  GENERAL:  He is a well-developed, well-nourished  male. He appears to  be oriented x3 and appears be relatively good historian.  VITAL SIGNS:  He is currently afebrile. He has had low grade temperatures  over the last 48 hours. Most recent blood pressure was 146/75 with pulse 74.  Respiratory effort appears normal.  ABDOMEN:  His abdomen shows some mild distension. His bladder is  questionably distended at this time but due to some mild tenderness it is  difficult to say with certainty.  GENITALIA:  Penis is uncircumcised with a normal meatus. Scrotum, testes and  other structures are normal.  RECTAL:  The patient has reduced sphincter tone rectally. He has a  relatively small and mildly diffusely indurated prostate. There are no  obvious rectal masses. Seminal vesicles are nonpalpable.   ASSESSMENT:  Ongoing urinary  retention. This patient has had some  longstanding progressive voiding symptoms and was having great deal of  difficulty voiding preoperatively despite high dose of Flomax. I think given  that scenario and especially with his recent multiple surgeries, that it is  unlikely that he is going to develop a leak spontaneously any time in the  near future. His prostate on digital rectal exam was relatively small. I  suspect that this may be a combination of some outlet obstruction.   PLAN:  Concern that he has a component of an atonic bladder. His cath  residuals have been in excess of 1000 cc which again would go along with a  hypocontractile atonic bladder. I think since he is otherwise ready for  discharge, the most prudent course of action would be to go ahead and place  a Foley catheter and allow him to be discharge in the morning assuming he  meets criteria and then have an evaluation in our office in approximately a  week. We would plan on removal of his Foley catheter with flexible  cystoscopy and attempted repeat voiding trial. He will probably need some  formal urodynamic studies as well. If he is really unable to void  we are  going to have to make a better assessment as to how much of this outlet  obstruction versus atonic bladder and then make a decision as to how to  manage him with TURP versus suprapubic tube or combination thereof. We will  arrange for his follow-up. I appreciate the consultation.       ___________________________________________  Valetta Fuller, M.D.    DSG/MEDQ  D:  10/24/2004  T:  10/25/2004  Job:  [id number]   cc:   Hilda Lias, M.D.  [address]  [place], [place] [address]  Fax: [phone]   Vikki Ports, MD  [id number]  [place]  [place] [address]   Iva Boop, M.D. [facility]

## 2010-10-26 NOTE — Discharge Summary (Signed)
[name], [name] NO.:  [id number]   MEDICAL RECORD NO.:  [id number]          PATIENT TYPE:  INP   LOCATION:  2032                         FACILITY:  [facility]   PHYSICIAN:  Salvatore Decent. Cornelius Moras, M.D. DATE OF BIRTH:  04/03/1932   DATE OF ADMISSION:  08/04/2006  DATE OF DISCHARGE:  08/10/2006                               DISCHARGE SUMMARY   ADMISSION DIAGNOSIS:  Acute coronary syndrome.   DISCHARGE/SECONDARY DIAGNOSES:  1. Critical three vessel coronary artery disease.  Status post      coronary artery bypass graft.  2. Class IV unstable angina.  3. Acute coronary syndrome with non-ST segment elevation myocardial      infarction.  4. Postoperative atrial fibrillation with rapid ventricular response,      current resolved with a history of paroxysmal atrial fibrillation.  5. Diabetes mellitus type 2.  6. Hyperlipidemia.  7. Prostate cancer, status post prostatectomy with radiation therapy.  8. History of bladder neck contracture with neurogenic bladder      requiring chronic indwelling suprapubic catheter.  9. Urinary tract infection being treated with a 15 day course of      Bactrim as by Dr. Penni Bombard (started prior to admission).  10.Degenerative joint disease of the lumbosacral spine.  11.Methicillin resistant Staphylococcus aureus wound infection      following laparotomy in May of 2006.  12.History of right colectomy for ischemic bowel secondary to ileus      with ischemic colitis following lumbar laminectomy, diskectomy in      May of 2006.  13.Repair of ventral incisional hernia in October of 2007.  14.History of acute blood loss anemia, improved.   ALLERGIES:  MORPHINE, CODEINE, ZOCOR AND PENICILLIN.   PROCEDURES:  1. August 05, 2006:  Urgent coronary artery bypass grafting x4 of      left internal mammary artery to the distal left anterior      descending, saphenous vein graft to the third diagonal branch,      saphenous vein graft to the third  circumflex marginal branch with      sequential saphenous vein graft to the left posterior lateral      branch, endoscopic saphenous vein harvest from the right thigh and      right lower leg.  Surgeon was Dr. Tressie Stalker.  2. August 05, 2006: Cardiac catheterization by Dr. Charlies Constable      showing severe 3 vessel coronary artery disease with estimated      ejection fraction of 50%.  See report for details.  3. August 05, 2006:  PreCABG Doppler showing ABI of 0.97 on the      right and 1.13 on the left.  Carotid Duplex showing bilateral mild      heterogenous plaque at the bifurcation extending the proximal      internal and external carotid arteries.  There is no evidence of      significant internal carotid artery stenosis, vertebral artery flow      was antegrade.   BRIEF HISTORY:  [name] is a 77 year old Caucasian male who developed  substernal chest  pain in the early morning of August 04, 2006 that  awoke him from sleep.  The pain resolved and he felt well throughout the  day but developed recurrent chest tightness around 3:30 that afternoon  after eating.  He started feeling a pressure in his upper abdomen across  his chest.  The pain lasted approximately 15 minutes.  He presented to  [facility] emergency department for evaluation.  On presentation, he  was found to have marked ST depression in his lateral leads despite  being pain-free at the time.  Repeat EKG was performed approximately 15  minutes later which showed normalization of his EKG changes.  He was  treated for acute coronary syndrome, started on aspirin, heparin and  nitroglycerin and transferred to bed in CCU for further monitoring  treatment.   HOSPITAL COURSE:  [name] was admitted to [facility] from  the [facility] emergency department diagnosed with acute coronary  syndrome.  He ruled in for non-ST segment elevation myocardial  infarction based on serial cardiac enzymes.  He underwent  cardiac  catheterization on August 05, 2006 by Dr. Juanda Chance and was found to have  a critical coronary anatomy.  An urgent cardiac surgery consultation was  requested and he was evaluated by Dr. Tressie Stalker who felt that he  should undergo coronary artery bypass graft surgery that day.  After  discussing the risks and benefits, the patient agreed to proceed.  In  the meantime, he did undergo preCABG Dopplers as discussed earlier.  Postoperatively, he was transferred to the surgical intensive care unit  where he remained the first two days.  During that time, he did require  short term atrial pacing but otherwise remained stable.  Chest tubes and  lines were discontinued postoperative day one.  He did require strict  diuresis.  He was also monitored for blood loss anemia but did not  require any postoperative transfusions.   On postoperative day two, he was transferred to the telemetry unit 2000  where he remained until discharge.  Several hours after transfer, he  developed rapid atrial fibrillation with a rate in the 120s.  He was  started on IV amiodarone and converted back to sinus rhythm within  several hours.  The following day, he was transitioned to his oral  amiodarone and his Digoxin was restarted, however, over the next 24  hours, he developed some mild asymptomatic sinus bradycardia with a rate  of around 55 to 65 and therefore his Digoxin was held.  He also  developed first degree heart block.  He otherwise did not have recurrent  atrial fibrillation.  He was able to tolerate a beta blocker and ace  inhibitor therapy.  At the time of this dictation, his blood pressure  was 127/70, heart rate is primarily in the 60s.  He has been afebrile  saturating above 92% on room air.  His Lasix has been discontinued as  his volume status has improved.  He does have trace lower extremity edema more pronounced on the right leg which is where is saphenous vein  was harvested.  His blood  sugars have been stable on his home regimen of  Januvia.  He did require additional laxative therapy but has had a  postoperative bowel movement and has been able to ambulate independently  and with cardiac rehab with the use of a rolling walker.  He is  tolerating a regular diet.  He has been continued on Septra for his  prehospitalization urinary tract infection.  As mentioned, he has a  suprapubic catheter.   DISCHARGE PHYSICAL EXAMINATION:  HEART:  Regular rate and rhythm.  LUNGS: Sounds are clear.  ABDOMEN: Soft, nontender abdomen, although mildly distended but with  positive bowel sounds.  His incisions appear to be healing well without  signs of infection.  His pain has been controlled on oral medications.  At this point, it was felt that [name] will be ready for discharge  on postoperative day five, August 10, 2006.  CHEST:  Sternal sutures were discontinued prior to discharge.   His latest chest x-ray on February 29th showed persistent vascular  congestion for which he will continue on his p.o. dose Lasix.  He did  also show streaky areas of atelectasis and small bilateral effusions.  His labs showed sodium 133, potassium 4.6, chloride 101, CO2 28, blood  glucose 108, BUN 18, creatinine 1.36.  White blood count 12.3 and it is  decreasing.  Hemoglobin 10.8, hematocrit 31.6, platelet count 157.  His  hemoglobin A1c is 6.5.  Lipid profile showed a cholesterol of 106,  triglycerides 212, HDL 29, LDL 35, VLDL 42, total cholesterol/HDL ratio  of 3.7.  Of note, his Lipitor was increased during this hospitalization.  His TSH was normal at 1.137.   DISCHARGE MEDICATIONS:  1. Coated aspirin 325 mg p.o. daily.  2. Toprol XL 50 mg p.o. daily.  3. Altace 2.5 mg p.o. daily.  4. Lipitor 80 mg p.o. daily.  5. Zetia 10 mg p.o. daily.  6. Prilosec 20 mg p.o. daily.  7. Januvia 50/500 mg p.o. daily.  8. Urelle 81 mg q.i.d. p.r.n.  9. Septra 800-160 1 tablet p.o. b.i.d. times one more  week.  10.Ditropan 5 mg p.o. t.i.d.  11.Amiodarone 200 mg two tablets p.o. b.i.d. times 10 more days and      then decrease 1 tablet p.o. b.i.d.  12.Vicodin 5/500 mg 1-2 tablets p.o. q 4-6 hours p.r.n. pain.   DISCHARGE INSTRUCTIONS:  He is to avoid driving or heavy lifting more  than 10 pounds.  He is encouraged to continue daily walking and  breathing exercises.  He is to follow a low fat, low salt, low  carbohydrate diet.  He may shower, clean incision with gentle soap and  water.  He should notify the surgeon's office should he develop fever of  greater than 101 or redness or drainage from his incision site.  He is  to follow up with Dr. Blossom Hoops physician assistant on August 19, 2006 at  8:45 a.m. and he should have a chest x-ray obtained at that appointment  as well.  He is to follow up with Dr. Tressie Stalker at his office in 3 weeks.  The office will contact him regarding the specific appointment  date and time.   DICTATED BY:  Jerold Coombe, P.A.      Salvatore Decent. Cornelius Moras, M.D.  Electronically Signed     CHO/MEDQ  D:  08/10/2006  T:  08/10/2006  Job:  [id number]

## 2010-10-26 NOTE — Op Note (Signed)
[name], [name]                ACCOUNT NO.:  [id number]   MEDICAL RECORD NO.:  [id number]            PATIENT TYPE:   LOCATION:                                 FACILITY:   PHYSICIAN:  Velora Heckler, MD           DATE OF BIRTH:   DATE OF PROCEDURE:  03/25/2006  DATE OF DISCHARGE:                                 OPERATIVE REPORT   PREOPERATIVE DIAGNOSIS:  Recurrent ventral incisional hernia.   POSTOPERATIVE DIAGNOSIS:  Recurrent ventral incisional hernia.   PROCEDURE:  1. Repair of ventral incisional hernia with bilateral rectus advancement      flaps  2. Application of onlay MTF allograft   SURGEON:  Velora Heckler, MD, FACS   ASSISTANT:  Thornton Park. Daphine Deutscher, MD, FACS   ANESTHESIA:  General per Jenelle Mages. Fortune, M.D.   ESTIMATED BLOOD LOSS:  Minimal.   PREPARATION:  Betadine.   COMPLICATIONS:  None.   INDICATIONS:  The patient is a 77 year old white male who was referred by  Dr. Barron Alvine for recurrent ventral incisional hernia.  The patient has  had a history of MRSA infection.  He has a suprapubic tube which is  permanent.  He now comes to surgery for repair of anterior abdominal wall  incisional hernia.   BODY OF REPORT:  Procedure is done in OR #11 at the [facility].  The patient is brought to the operating room, and placed in the supine  position on the operating room table.  Following administration of general  anesthesia the patient is prepped and draped in the usual strict aseptic  fashion.  A Steri-Drape is used to exclude the suprapubic tube from the  operative field.  Using a #10 blade.  The previous surgical scar is  completely excised.  Dissection is carried into the subcutaneous tissues.  Hernia sac is opened; and the peritoneal cavity is entered.  Omental  adhesions to the hernia sac into the anterior abdominal wall are taken down  completely using the electrocautery for hemostasis, and ligature of vessels  with 2-0 silk ties.  Hernia sac  is completely debrided and excised.  Previous suture material is excised.  Skin flaps were then developed  circumferentially around the fascial defect in the plane just anterior to  the anterior rectus fascia.  Hemostasis is obtained with the electrocautery.  Fascial edges were debrided back to healthy viable tissue at the edge of the  rectus muscles.   Next bilateral rectus advancement flaps are created by incising the anterior  rectus sheath from approximately 2 cm below the costal margin to near the  level of the arcuate ligament.  This allows for approximately 2-3 cm of  advancement on each side.  The rectus muscles were then able to be  reapproximated in the midline without undue tension.  Midline wound is then  closed with interrupted #1 Novofil sutures.  Good approximation is achieved  without undue tension.   Next the mid line suture line is reinforced with ultra thick MTF allograft.  Allograft is properly positioned.  It is secured circumferentially with  interrupted #0 Novofil sutures.  The wound is covered with a 4 x 16 cm sheet  of MTF and a 4 x 7 cm sheet MTF.  Wound is irrigated.  Representative  photographs were made for the medical record.  Then 19-French Blake drains  were brought in bilaterally from inferior stab wounds and secured to the  skin with #0 Novofil sutures.  Subcutaneous tissues were closed with a  running 2-0 Vicryl suture.  Skin is closed with stainless steel staples.  Drains are placed to bulb suction.  Sterile dressings are applied.  The  patient is placed in an abdominal binder.  The patient is awakened from  anesthesia and brought to the recovery room in stable condition.  The  patient tolerated the procedure well.      Velora Heckler, MD  Electronically Signed     TMG/MEDQ  D:  03/25/2006  T:  03/26/2006  Job:  [id number]   cc:   Valetta Fuller, M.D.  Fax: [phone]   Quita Skye. Artis Flock, M.D.  Fax: [phone]

## 2010-10-26 NOTE — Op Note (Signed)
[name]. [facility]  Patient:    [name], [name] Visit Number: [id number] MRN: [id number]          Service Type: DSU Location: [facility] Attending Physician:  [name] Dictated by:   [name], M.D. Proc. Date: 03/10/01 Admit Date:  03/10/2001   CC:         [name], M.D.   Operative Report  CCS# [id number]  PREOPERATIVE DIAGNOSIS:  Superumbilical hernia.  POSTOPERATIVE DIAGNOSIS:  Superumbilical hernia.  Prior umbilical herniorrhaphy intact.  OPERATION PERFORMED:  SURGEON:  [name], M.D.  ASSISTANT:  ANESTHESIA:  General.  INDICATIONS FOR PROCEDURE:  The patient is a 77 year old male who underwent an umbilical hernia repair in 1993.  He has developed a superumbilical component  DESCRIPTION OF PROCEDURE:  The patient was taken to room 3 at [facility] and given general anesthesia.  The abdomen was prepped with Betadine and draped sterilely.  I went through his previous transverse incision.  I found the previous Prolene in the vest over pants repair inferior to the present hernia.  Probably was approximately 2 to 3 cm separation. There was a nickel sized fascial defect.  The hernia sac was resected and I could see the bowel beneath this and did not wish to place mesh inside the abdomen.  I then created another vest over pants repair using tissue, his own tissue and 2-0 or 0 Prolene pop-offs threading it up through a piece Parietex mesh that was the bolus that tied down all four of these vest over pants horizontal mattress type sutures in this repair.  Intact repair looked good. The area was irrigated with saline and was closed with 4-0 Vicryl subcutaneously with benzoin and Steri-Strips on the skin.  The patient seemed to tolerate the procedure well.  An abdominal binder was placed for the perioperative period.  The patient will be given a dose of Maxidone for pain and will be followed up in the  office in two weeks. Dictated by:   [name], M.D. Attending Physician:  [name] DD:  03/10/01 TD:  03/10/01 Job: [id number]

## 2010-10-26 NOTE — H&P (Signed)
[name], [name] NO.:  [id number]   MEDICAL RECORD NO.:  [id number]          PATIENT TYPE:  INP   LOCATION:  2903                         FACILITY:  [facility]   PHYSICIAN:  Veverly Fells. Excell Seltzer, MD  DATE OF BIRTH:  08-13-1931   DATE OF ADMISSION:  08/04/2006  DATE OF DISCHARGE:                              HISTORY & PHYSICAL   CHIEF COMPLAINT:  Chest pain.   HISTORY OF PRESENT ILLNESS:  [name] is a 77 year old gentleman who  developed substernal chest pain early this morning that awoke him from  sleep.  The pain resolved, and he felt well through the day.  He  developed recurrent chest tightness at 3:30 this afternoon after eating.  He felt a feeling of pressure in the upper abdomen and across his  chest.  The pain lasted approximately 15 minutes, and he presented to  the [facility] emergency department for evaluation.   Upon his presentation here, he was found to have marked ST depression in  his lateral leads, despite being painfree at that time.  A repeat EKG  was performed approximately 15 minutes later that showed normalization  of his EKG changes.   Currently, [name] is comfortable.  He denies any chest pain.  He  denies dyspnea, lightheadedness, syncope, orthopnea, PND, palpitations,  or edema.   Over the last few weeks, he has been in his normal state of health.  He  has been active and has had no symptoms with activity.   MEDICATIONS:  1. Lipitor 20 mg daily.  2. Zetia 10 mg daily.  3. Digitek 0.125 mg daily.  4. Toprol XL 50 mg daily.  5. Prilosec 20 mg daily.  6. Hormone therapy for prostate cancer.  7. Janumet 50/500 mg daily.  8. Vicodin as needed.  9. The patient is also on an unspecified antibiotic for a bladder      infection and another medication for bladder spasm.   PAST MEDICAL HISTORY:  1. Pertinent for paroxysmal atrial fibrillation with a previous      isolated episode.  2. Dyslipidemia.  3. Recently diagnosed type 2  diabetes.  4. Prostate cancer.  5. Indwelling suprapubic catheter.  6. Prior back surgery.  7. History of ileus and ischemic colitis following back surgery.  8. History of MRSA complicating his bowel surgery.   SOCIAL HISTORY:  The patient lives in [place] with his wife.  He is  retired and was previously a [employer] manager.  He is a remote  smoker but quit over 40 years ago.  He does not drink alcohol.  He walks  occasionally for exercise and follows a good diet.   FAMILY HISTORY:  Mother died at age 66 and father died at age 59.  Both  had a history of coronary artery disease.  He has multiple siblings with  coronary artery disease.   REVIEW OF SYSTEMS:  A complete 12-point review of systems was performed.  Pertinent positives included sweats, exertional dyspnea over a two month  period, right foot edema, urinary frequency, urgency, and dysuria.  All  other  systems were reviewed and are negative, except as described above.   PHYSICAL EXAMINATION:  GENERAL:  The patient is alert and oriented.  He  is in no acute distress.  VITAL SIGNS:  Temperature is 98.8, heart rate 67, respiratory rate 20,  blood pressure 136/72.  Oxygen saturation is 98% on 2 liters of oxygen  per nasal cannula.  HEENT:  Normal.  NECK:  Normal carotid upstrokes without bruits.  Jugular venous pressure  is normal.  There is no thyromegaly or thyroid nodules.  LUNGS:  Clear to auscultation bilaterally.  HEART:  The apex is discrete and nondisplaced.  The heart is a regular  rate and rhythm without murmurs or gallops.  There is no right  ventricular heave or lift.  SKIN:  There is no rash or lesions; however, there are stasis changes in  the lower legs.  ABDOMEN:  Soft and nontender.  There is no rebound or guarding.  There  is an indwelling suprapubic catheter.  EXTREMITIES:  There is 1+ bilateral pretibial edema.  MUSCULOSKELETAL:  There are no joint deformities or effusions.  BACK:  There is no  CVA tenderness.  NEUROLOGIC:  Alert and oriented to all spheres.  Cranial nerves II-XII  are grossly intact.  Strength is 5/5 and equal in the arms and legs  bilaterally.   Chest x-ray was reported as no acute findings.   EKG as detailed above from 4:59 p.m. today.  Demonstrates normal sinus  rhythm with marked lateral ST depression, suggestive of ischemia.  There  is also left anterior fascicular block present.   Repeat EKG from 5:12 p.m. demonstrates minimal ST depression in the  lateral leads with basically normalization of the ST segments.   Initial blood work shows an elevated CK-MB of 5.8 and a troponin of  0.39.  INR is 1.  Creatinine is 1.3.  Hemoglobin is 11.6, white blood  cell count is 10.8.   ASSESSMENT:  1. This is a 77 year old male with an acute coronary syndrome.  The      patient had dynamic EKG changes and has ruled in for myocardial      infarction.  We have started him on aspirin, heparin, and      nitroglycerin.  If he has recurrent symptoms, will add a 2B3A      inhibitor and consider urgent catheterization.  The patient will be      transferred to [facility] CCU in the setting of his dynamic ECG      changes.  We will perform a left heart catheterization tomorrow.      Risks and indications of the procedure were reviewed in detail with      the patient and his family, who are agreeable with proceeding.  I      also will start [name] on low dose beta blocker with metoprolol      25 mg twice daily and will increase his statin to Lipitor 80 mg.  2. Paroxysmal atrial fibrillation:  Patient remains in sinus rhythm.      Continue current medications.  3. Prostate cancer, status post radiation.  Appears stable on current      therapy.  4. Dyslipidemia:  See above.  In the setting of acute coronary      syndrome, will increase atorvastatin dose.   Further plans pending the result of [name]'s coronary anatomy, as determined by left heart catheterization and  coronary angiography.      Veverly Fells. Excell Seltzer, MD  Electronically Signed     MDC/MEDQ  D:  08/04/2006  T:  08/05/2006  Job:  [id number]   cc:   Quita Skye. Artis Flock, M.D.  Fax: [phone]

## 2010-10-26 NOTE — Letter (Signed)
October 02, 2006    Quita Skye. Artis Flock, M.D.  [address], [address]  [place], [place] [address]   RE:  [name], [name]  MRN:  [id number]  /  DOB:  [birth year]   Dear Rosanne Ashing:   It was a pleasure seeing this patient, [name]. Resnick for followup on  October 02, 2006.  As you know, he is a very pleasant 77 year old white  married male with history of paroxysmal atrial fibrillation.  ,He  presented to the hospital on August 04, 2006 with non-ST elevation  myocardial infarction.  He underwent catheterization by Dr. Juanda Chance with  evidence of severe 3-vessel disease, and subsequently underwent CABG x4  by Dr. Barry Dienes with anastomosis with LIMA to the LAD, SVG to the 3rd  diagonal, SVG to the 3rd circumflex marginal, and sequential SVG to the  left posterolateral branch.   Patient had some postop atrial fibrillation, was on amiodarone.  He  presented back to the hospital with failure to thrive on August 15, 2006,  and evidence of acute on chronic renal disease, hyponatremia, anemia,  urinary tract infection.  He also has history of diabetes and  hyperlipidemia.   When seen in the office on August 19, 2006, he was in normal sinus  rhythm.  He was feeling much better.  Appetite was good.  He has  continued to improve to this time, though he is still having some  urinary tract symptoms.   Past history also reveals a right colectomy for ischemic bowel in May  2006.  Apparently, Coumadin was suggested in 2006 because of paroxysmal  atrial fibrillation, and it was not started after his recent surgery  when he had postop atrial fibrillation.   His medications include:  1. Prilosec 20.  2. Altace 2.5.  3. Lipitor 80.  4. Zetia 10.  5. __________ 50/500 b.i.d.  6. Metoprolol 50.  7. Aspirin 325.   Blood pressure 141/78.  Pulse 56.  Normal sinus rhythm.  GENERAL APPEARANCE:  Normal.  JVP is not elevated.  Carotid pulses are palpable without bruits.  LUNGS:  Clear.  CARDIAC EXAM:  Unremarkable.  EXTREMITIES:  Normal.   EKG reveals sinus bradycardia, left anterior hemiblock, and right bundle  branch block consistent with bifascicular block.  There are rare PVCs.   The recent 2D echo revealed normal LV with no wall motion abnormality.   I suggest the patient continue on the same therapy.  We will plan to  check an abdominal ultrasound because he did have some irregularity and  ectasia of his distal aorta, and I am still concerned about the fact  that he is not on Coumadin, and we will check a Holter monitor.   Thank you for the opportunity to share in this nice gentleman's care.  I  will plan to see him back in 3 or 4 weeks, or any other time you so  desire.    Sincerely,      E. Graceann Congress, MD, [facility]  Electronically Signed    EJL/MedQ  DD: 10/02/2006  DT: 10/02/2006  Job #: [id number]

## 2010-10-26 NOTE — Cardiovascular Report (Signed)
NAMEJW, COVIN NO.:  [id number]   MEDICAL RECORD NO.:  [id number]          PATIENT TYPE:  INP   LOCATION:  2399                         FACILITY:  [facility]   PHYSICIAN:  Everardo Beals. Juanda Chance, MD, FACCDATE OF BIRTH:  09-May-1932   DATE OF PROCEDURE:  08/05/2006  DATE OF DISCHARGE:                            CARDIAC CATHETERIZATION   PRIMARY CARE PHYSICIAN:  Bradd Canary, M.D.   PAST MEDICAL HISTORY:  [name] is 77 years old and has no prior  history of known heart disease.  He has had a history of paroxysmal  atrial fibrillation but has not been on Coumadin.  He also has new onset  diabetes.  He is admitted to the hospital with chest pain and lateral ST  depression and Q waves in V1 through V3 and positive markers with an  initial MB of 5.8 and a troponin of 0.39.  He was scheduled for  angiography today.   PROCEDURE:  The procedure was performed via the right femoral artery  using an arterial sheath and 6-French preformed coronary catheter.  A  front-wall arterial puncture was performed and nonopaque contrast was  used.  Subclavian injection was used to assess the internal mammary  artery for its suitability for bypass grafting.  A distal aortogram was  performed to rule out abdominal aortic aneurysm.  The patient tolerated  the procedure well and left the laboratory in satisfactory condition.   RESULTS:  Left main coronary artery:  The left main coronary artery was  free of major obstruction.   The left anterior descending artery:  The left anterior descending  artery had a 90% ostial stenosis and a 40% proximal stenosis.  Second  diagonal branch had an 80% proximal stenosis.  There was heavy  calcification in the LAD.   The circumflex artery:  The circumflex artery was a dominant vessel,  gave rise to a ramus branch, a marginal branch, two posterolateral  branches, and a posterior descending branch.  There was 95% ostial  stenosis, which appeared  to have thrombus and heavy calcification.  There was 50% narrowing in the proximal segments.  There were 80 and 90%  stenoses in the first of the two posterolateral branches.   The right coronary artery:  The right coronary artery was a nondominant  vessel, which was completely occluded proximally.  The conus branch  filled the right __________ branches via collaterals.   The left ventriculogram:  The left ventriculogram performed in the RAO  projection showed hypokinesis of the inferior wall.  The overall wall  motion was good, with an estimated ejection fraction of 50%.   Distal aortogram:  A distal aortogram was performed, which showed  ectasia and irregularity in the distal aorta and dilatation of the iliac  but no major obstruction.  There was no significant renal artery  stenosis.   HEMODYNAMIC DATA:  The aortic pressure was 105/55 with a mean of 76 and  the left ventricular pressure was 105/6.   CONCLUSION:  Severe three-vessel coronary artery disease with 90%  ostial, 40% proximal stenosis in the left anterior  descending artery,  and 80% stenosis in the second diagonal branch, 95% stenosis in the  ostium of a dominant circumflex artery with 80% and 90% stenoses in the  first posterolateral branch and total occlusion of nondominant right  coronary artery with inferior wall hypokinesis and estimated ejection  fraction of 50%.   RECOMMENDATIONS:  The patient came in with a non-ST-elevation infarction  and has very critical anatomy.  I think he is best treated with urgent  surgical revascularization.  Dr. Tressie Stalker has been consulted.      Bruce Elvera Lennox Juanda Chance, MD, [facility]  Electronically Signed     BRB/MEDQ  D:  08/05/2006  T:  08/05/2006  Job:  [id number]   cc:   Quita Skye. Artis Flock, M.D.  Cecil Cranker, MD, [facility]

## 2010-10-26 NOTE — Op Note (Signed)
NAMENICHAEL, EHLY NO.:  [id number]   MEDICAL RECORD NO.:  [id number]          PATIENT TYPE:  INP   LOCATION:  3014                         FACILITY:  [facility]   PHYSICIAN:  Hilda Lias, M.D.   DATE OF BIRTH:  [birth year]   DATE OF PROCEDURE:  10/05/2004  DATE OF DISCHARGE:                                 OPERATIVE REPORT   PREOPERATIVE DIAGNOSIS:  Degenerative disc disease with L4-L5 herniated  disc, right, right L5 radiculopathy.   POSTOPERATIVE DIAGNOSIS:  Degenerative disc disease with L4-L5 herniated  disc, right, right L5 radiculopathy.   PROCEDURE:  Bilateral L4-L5 laminotomy, right L4-L5 discectomy, left L4-L5  foraminotomy, repair of dura matter, microscope.   SURGEON:  Hilda Lias, M.D.   ASSISTANT:  Stefani Dama, M.D.   CLINICAL HISTORY:  Mr. Thammavong was admitted because of back pain with  radiation to both legs, the right worse than the left one.  The patient has  failed conservative treatment.  The x-ray showed multiple levels but at the  level of 4-5, he has herniated disc with hypertrophic facet, right worse  than left.  Surgery was advised.  The risks were explained to him with the  possibility of infection, CSF leak, need for further surgery.   PROCEDURE:  The patient was taken to the OR and he was positioned in a prone  manner.  His back was prepped with Betadine.  A midline incision from the  upper part of L4 down to L5 was made.  The muscles were retracted from his  right side.  With the Leksell, we removed the lamina from L4-L5.  An x-ray  was taken which showed that, indeed, we were at the level of 4-5.  With the  microscope, we started removing the yellow ligament which was calcified.  There was adhesions between the yellow ligament and the dura matter,  especially in the middle.  During dissection, there was opening of the dura  matter.  Because of that, we proceeded and we did removal of the spinous  process of L4 and L5 and  we did bilateral 4-5 laminotomies.  The dura matter  was repaired using Prolene.  Then, we identified the area between 4-5.  Indeed, there was a large herniated disc with displacement of the dural sac.  Retraction of the dural sac was achieved.  We entered the disc space which  was quite narrow.  Removal of a large fragment of disc was accomplished.  Then, foraminotomy to decompress the L4-L5 level was accomplished.  On the  left side, we did foraminotomy to the left 4-5.  Then, after we used  Prolene, Valsalva maneuver was negative up to 40.  Nevertheless, we used  Duragen as well as Tisseel to accomplish more sealing of the dura matter.  The area was irrigated.  The wound was closed with Vicryl and nylon.  The  patient did well.      EB/MEDQ  D:  10/05/2004  T:  10/05/2004  Job:  [id number]

## 2010-10-26 NOTE — Consult Note (Signed)
[name], [name] NO.:  [id number]   MEDICAL RECORD NO.:  [id number]          PATIENT TYPE:  INP   LOCATION:  2399                         FACILITY:  [facility]   PHYSICIAN:  Salvatore Decent. Cornelius Moras, M.D. DATE OF BIRTH:  Jan 14, 1932   DATE OF CONSULTATION:  08/05/2006  DATE OF DISCHARGE:                                 CONSULTATION   CARDIOTHORACIC SURGERY CONSULTATION   PRIMARY CARE PHYSICIAN:  Dr. Bradd Canary.   REASON FOR CONSULTATION:  Critical three-vessel coronary artery disease  with class IV unstable angina.   HISTORY OF PRESENT ILLNESS:  [name] is a 77 year old gentleman from  [place] with multiple medical problems but no previous history of  coronary artery disease. His cardiac history is notable for risk factors  of type 2 diabetes mellitus, hyperlipidemia, and remote history of  tobacco use. The patient also has history of paroxysmal atrial  fibrillation in the past, although reportedly recently the patient has  remained in sinus rhythm and he is not on Coumadin. The patient was in  his usual state of health until yesterday morning when he developed new  onset substernal chest tightness. These symptoms subsided after period  of time, but later that day he developed more profound substernal chest  tightness radiating to his neck and jaw. This pain persisted for more  than 15 minutes ultimately prompting him to present to the emergency  room for evaluation and treatment.  Baseline electrocardiogram was  notable for ST segment depression in lateral leads, and the patient's  symptoms resolved with heparin and nitroglycerin. Cardiac enzymes were  mildly abnormal consistent with mild non ST-segment elevation myocardial  infarction. The patient remained stable overnight and was taken for  cardiac catheterization this morning by Dr. Juanda Chance.  He was found to  have critical three-vessel coronary artery disease with mild left  ventricular dysfunction. Urgent  cardiac surgical consultation was  requested.   REVIEW OF SYSTEMS:  GENERAL:  The patient reports normal appetite. He  has not been gaining or losing weight recently. Energy level remains  somewhat low as he has had difficult problems over the last  year due to  his multiple other medical problems. CARDIAC:  The patient specifically  denies any history of chest tightness, chest pain or chest pressure  prior to that which developed yesterday.  The patient reports only mild  exertional shortness of breath and otherwise has no problems with  shortness of breath with activity or at rest. He specifically denies  PND, orthopnea, or lower extremity edema. He has not had any  palpitations or dizzy spells recently. RESPIRATORY:  Negative. The  patient denies productive cough, hemoptysis, wheezing. GASTROINTESTINAL:  Negative. The patient reports no bowel problems. The patient denies  difficulty swallowing. He denies hematochezia, hematemesis, melena.  MUSCULOSKELETAL:  Negative. The patient does report mild arthralgias  involving the fingers of both hands. NEUROLOGIC:  Negative. The patient  denies symptoms suggestive of previous TIA or stroke. GENITOURINARY:  Notable for chronic indwelling suprapubic catheter in his bladder for  which he is followed by Dr. Isabel Caprice. He intermittently has problems  with  urinary tract infections manifested as symptoms of burning and dysuria,  and he has recently been started on treatment through Dr. Blair Heys office  for recent bladder infection. He denies fevers or chills associated with  this. He denies hematuria. HEENT:  Negative. The patient is edentulous  with full set upper and lower dentures.   PAST MEDICAL HISTORY:  1. Type 2 diabetes mellitus.  2. Hyperlipidemia.  3. Paroxysmal atrial fibrillation.  4. Prostate cancer status post prostatectomy with radiation therapy.  5. Bladder neck contracture with neurogenic bladder requiring chronic      indwelling  suprapubic catheter.  6. Degenerative disk disease of the lumbosacral spine.  7. Methicillin-resistant Staphylococcus aureus wound infection      following laparotomy May 2006.   PAST SURGICAL HISTORY:  1. Prostatectomy with radiation therapy.  2. Suprapubic bladder catheter.  3. Lumbar laminectomy and diskectomy (L4-L5) May 2006.  4. Exploratory laparotomy with right colectomy for ischemic bowel      secondary to ileus with ischemic colitis following lumbar      laminectomy, diskectomy May 2006.  5. Repair of ventral incisional hernia October 2007.   FAMILY HISTORY:  Notable for siblings with coronary artery disease.   SOCIAL HISTORY:  The patient is married and lives with his wife in  [place]. 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 The patient has remote history of tobacco use, although he  quit smoking 40 years ago. The patient denies significant alcohol  consumption.   MEDICATIONS PRIOR TO ADMISSION:  1. Lipitor 20 mg daily.  2. Zetia 10 mg daily.  3. Digitek 0.125 mg daily.  4. Toprol XL 50 mg daily.  5. Prilosec 20 mg daily.  6. Janumet 50/500 one tablet daily.  7. Septra DS one tablet daily (new).  8. Urelle one tablet every six hours as needed.  9. Vicodin tablets as needed for pain.   DRUG ALLERGIES:  PENICILLIN causes rash. MORPHINE and CODEINE cause  confusion, nausea.   PHYSICAL EXAMINATION:  GENERAL:  The patient is a thin white male who  appears somewhat older than his stated age in no acute distress.  VITAL SIGNS:  He is currently in sinus rhythm. He is afebrile.  HEENT:  Essentially unrevealing. The patient is edentulous.  NECK:  Supple. There are no carotid bruits.  CHEST:  Auscultation of the chest demonstrates clear and symmetrical breath sounds bilaterally. No wheezes or rhonchi are  noted.  CARDIOVASCULAR:  Regular rate and rhythm. No murmurs, rubs or gallops  are appreciated.  ABDOMEN:  Soft, nontender. There is a well-healed  laparotomy scar. There is a chronic indwelling suprapubic bladder  catheter.  EXTREMITIES:  Warm and adequately perfused. There is no lower extremity  edema. Distal pulses are not palpable in either lower leg or the ankle.  SKIN:  Shiny with decreased hair and some hemosiderosis in both lower  legs consistent with chronic microvascular disease.  RECTAL/GU:  Both deferred.  NEUROLOGIC:  Grossly nonfocal.   DIAGNOSTIC TESTS:  Cardiac catheterization performed by Dr. Juanda Chance is  reviewed. This demonstrates critical three-vessel coronary artery  disease with 90% ostial stenosis of the left anterior descending  coronary artery and diffusely diseased proximal and mid left anterior  descending coronary artery with heavy calcification. There is 95-99%  critical ostial occlusion  of the left circumflex coronary artery with  left dominant coronary circulation. There is 90% proximal stenosis of a  large second circumflex marginal branch. There is 100% chronic occlusion  of the right coronary artery which is nondominant. Left ventricular  function is notable for ejection fraction estimated at 50% with inferior  wall hypokinesis.   IMPRESSION:  Critical three-vessel coronary artery disease with class IV  unstable angina, non ST segment elevation myocardial infarction. I  believe that [name] would best be treated with coronary artery  bypass grafting. He will be at high risk for a variety of surgical  complications due to his numerous comorbid medical conditions.  Unfortunately there are no good alternatives to surgical  revascularization, and given his unstable presentation and critical  coronary anatomy I believe we should proceed with surgery as soon as  possible.   PLAN:  I have discussed options at length with [name] and his  family.  Alternative treatment strategies have been discussed. They  understand and accept all associated risks of surgery including but not  limited to risk of death, stroke, myocardial infarction, congestive  heart failure, respiratory failure, pneumonia, bleeding requiring blood  transfusion, arrhythmia, infection, and recurrent coronary artery  disease. Given his complex medical condition, acute presentation, and  relatively high risk for surgery, I do not feel that concomitant MAZE  procedure would likely be worth the potential additional associated  risks. We plan to proceed with surgery today.      Salvatore Decent. Cornelius Moras, M.D.  Electronically Signed     CHO/MEDQ  D:  08/05/2006  T:  08/05/2006  Job:  [id number]   cc:   Everardo Beals. Juanda Chance, MD, [facility]  Cecil Cranker, MD, [facility]  Quita Skye. Artis Flock, M.D.

## 2010-10-26 NOTE — H&P (Signed)
NAMEJEVIN, CAMINO NO.:  [id number]   MEDICAL RECORD NO.:  [id number]          PATIENT TYPE:  INP   LOCATION:  4737                         FACILITY:  [facility]   PHYSICIAN:  Della Goo, M.D. DATE OF BIRTH:  September 30, 1931   DATE OF ADMISSION:  08/15/2006  DATE OF DISCHARGE:                              HISTORY & PHYSICAL   PRIMARY CARE PHYSICIAN:  Dr. Artis Flock.   CHIEF COMPLAINT:  Weakness.   This is a 77 year old male who underwent recent coronary artery bypass  grafting 10 days ago who was brought to the emergency department  secondary to increased weakness, decreased p.o. intake, and fevers and  chills for the past 3 days.  The patient does have a suprapubic catheter  and a history of recurrent urinary tract infections, and had been on  Bactrim therapy as an outpatient.  The patient denies having any pain  around his sternal wound or any drainage from the sternal wound area.  The patient denies having any cough or congestion or shortness of  breath.  The patient was evaluated in the emergency department and found  to have an elevated white blood cell count of 17,000, along with mild  hypotension.  The patient was placed on IV fluids for re-hydration  therapy along with IV Cipro antibiotic therapy.  The patient was then  referred for admission/observation.   PAST MEDICAL HISTORY:  1. Coronary artery disease, recent coronary artery bypass grafting.  2. Hypertension.  3. Type 2 diabetes mellitus.  4. Indwelling suprapubic catheter and history of recurrent urinary      tract infections.   MEDICATIONS:  To be appended.   ALLERGIES:  1. PENICILLIN.  2. CODEINE.  3. MORPHINE.   SOCIAL HISTORY:  The patient is married, nonsmoker, nondrinker.   REVIEW OF SYSTEMS:  Pertinent factors mentioned above.   PHYSICAL EXAMINATION FINDINGS:  GENERAL:  This is a 77 year old, well-  nourished, well-developed male in mild discomfort but no acute distress.  VITAL  SIGNS:  Reveal a low grade temperature 100.1, blood pressure  ranging from 99/70 to 110/80 currently, heart rate 90-110, and  respirations 20.  HEENT:  Normocephalic, atraumatic.  Pupils equally round, reactive to  light.  Extraocular muscles are intact.  Funduscopic benign Oropharynx  clear.  NECK:  Supple.  Full range of motion.  No thyromegaly, adenopathy,  jugular venous distension.  CARDIOVASCULAR:  Regular rate and rhythm.  CHEST WALL:  Linear midsternal surgical scar, healing well.  No signs of  infection.  ABDOMEN:  Positive bowel sounds, soft, nontender except for mild  suprapubic area tenderness.  Suprapubic catheter present.  EXTREMITIES:  Without cyanosis, clubbing or edema.  NEUROLOGIC:  The patient is alert and oriented x3.  There is mild  generalized weakness.  However, there are no focal deficits.   Urinalysis is positive for nitrates and leukocyte esterase.   ASSESSMENT:  A 77 year old male with weakness, fevers, chills, decreased  oral intake being admitted with:  1. Urinary tract infection.  2. Generalized weakness.  3. Coronary artery disease with recent coronary artery bypass  grafting.  4. Mild hypotension/dehydration.  5. Type 2 diabetes mellitus.   PLAN:  1. The patient will be admitted to the telemetry area, placed on IV      fluids, and IV antibiotic therapy of ciprofloxacin.  2. He will continue on his regular medications and sliding scale      coverage has been ordered p.r.n. elevated blood sugars.  3. The patient will be placed on Protonix therapy and Lovenox therapy      for GI and DVT prophylaxis, respectively.      Della Goo, M.D.  Electronically Signed     HJ/MEDQ  D:  08/16/2006  T:  08/16/2006  Job:  [id number]

## 2010-10-26 NOTE — Op Note (Signed)
[name], [name]                ACCOUNT NO.:  [id number]   MEDICAL RECORD NO.:  [id number]          PATIENT TYPE:  AMB   LOCATION:  NESC                         FACILITY:  [facility]   PHYSICIAN:  Valetta Fuller, M.D.  DATE OF BIRTH:  07-Oct-1931   DATE OF PROCEDURE:  12/23/2005  DATE OF DISCHARGE:  12/23/2005                                 OPERATIVE REPORT   PREOPERATIVE DIAGNOSES:  1. Urinary retention.  2. Bladder neck contracture.  3. Low capacity neurogenic bladder.  4. History of adenocarcinoma prostate.   POSTOPERATIVE DIAGNOSES:  1. Urinary retention.  2. Bladder neck contracture.  3. Low capacity neurogenic bladder.  4. History of adenocarcinoma prostate.   PROCEDURE PERFORMED:  Cystoscopy with dilation of bladder neck contracture,  changing of suprapubic tube and balloon dilation of the urethra.   SURGEON:  Valetta Fuller, M.D.   ANESTHESIA:  General.   INDICATIONS:  [name] has had a complicated recent urologic history.  The  patient's initial problem was diagnosis of adenocarcinoma of the prostate  which was diagnosed after a limited TURP of his prostate because of ongoing  problems with urinary retention.   PRETREATMENT:  The patient had a normal PSA, but was found to have poorly  differentiated Gleason 8 adenocarcinoma of the prostate which was at least  of moderate volume.  He had no evidence of metastatic disease.  The patient  got a hormonal implant approximately a year ago and then subsequently  received external beam radiation therapy.  The patient has had a chronic  suprapubic tube.  He has been able to void, but has had really severe  problems with frequency and urgency, which was felt to be secondary to  significant bladder dysfunction which was probably exacerbated by the  radiation therapy.  At night, he generally hooks a suprapubic tube up to  drainage; otherwise, he would be up every 30 minutes voiding.  During the  day, he does void, but it  is quite frequently with considerable urgency.  Recent repeat assessment in our office including cystoscopy showed that the  patient did indeed have a fairly severe bladder neck contracture.  We were  able to get in his prostatic fossa which was relatively opened, but I was  unable to get into his bladder.  We had removed the suprapubic tube and  performed cystoscopy through the suprapubic tract.  Again, the patient  seemed to have a significant bladder neck contracture, probably as a result  of the limited TUR, subsequently with radiation therapy, which resulted in  scarring.  We felt that it would be prudent to consider him for dilation and  opening of the bladder neck contracture and then see if that would make any  difference with regard to his long term voiding dysfunction.  He is told  that even if we were to get this area open, his bladder dysfunction may  preclude him being able to void spontaneously.   TECHNIQUE AND FINDINGS:  The patient was brought to the operating room where  he had successful induction of general anesthesia.  He  was given  perioperative antibiotic therapy.  He was placed in the lithotomy position,  prepped and draped in the usual manner.  Cystoscopy again revealed  unremarkable anterior urethra and prostatic fossa.  He had a considerable  bladder neck contracture, which we were able to place a guidewire through.  We did use some __________ February 05, 2006 dilators to dilate this area and  then also used a dilating balloon in the standard manner to open up this  contracture.  At the completion of the procedure, we were able to place the  rigid cystoscope through this area of contracture.  His bladder showed some  chronic thickening and inflammatory changes.  A new suprapubic tube was  placed and the urethral catheter was left indwelling.  The patient was  brought to recovery room in stable condition.           ______________________________  Valetta Fuller, M.D.  Electronically Signed     DSG/MEDQ  D:  02/05/2006  T:  02/05/2006  Job:  [id number]

## 2010-10-26 NOTE — Discharge Summary (Signed)
[name], [name] NO.:  [id number]   MEDICAL RECORD NO.:  [id number]          PATIENT TYPE:  INP   LOCATION:  4737                         FACILITY:  [facility]   PHYSICIAN:  Marcellus Scott, MD     DATE OF BIRTH:  [birth year]   DATE OF ADMISSION:  08/15/2006  DATE OF DISCHARGE:  08/17/2006                               DISCHARGE SUMMARY   PRIMARY CARE PHYSICIAN:  Dr. Artis Flock.   CARDIOTHORACIC SURGEON:  Salvatore Decent. Cornelius Moras, M.D.   CARDIOLOGISTEverardo Beals Juanda Chance, MD, [facility].   DISCHARGE DIAGNOSES:  1. Failure to thrive.  2. Acute and chronic kidney disease.  3. Hyponatremia.  4. Anemia.  5. Presumed urinary tract infection.  6. Status post non-ST elevation myocardial infarction status post      coronary artery bypass graft.  7. Diabetes.  8. Dyslipidemia.  9. Postoperative atrial fibrillation.   DISCHARGE MEDICATIONS:  1. Chloraseptic spray to mouth p.r.n.  2. Lozenges p.r.n.  3. Ciprofloxacin 500 mg p.o. b.i.d. to complete a total of one week's      course.  4. Enteric-coated aspirin 325 mg p.o. daily.  5. Prilosec 20 mg p.o. daily.  6. Altace 2.5 mg p.o. daily.  7. Lipitor 80 mg p.o. daily.  8. Zetia 10 mg p.o. daily.  9. Amiodarone 200-mg tablet 2 p.o. b.i.d. through March 12 and then 1      p.o. b.i.d.  10.Ditropan 5mg  p.o. t.i.d.  11.Urelle  81 mg p.o. q.i.d. p.r.n.  12.Toprol XL 50 mg p.o. daily.  13.Janumet 50/500 one p.o. daily.  14.Vicodin 500, 1 to 2 tablets p.o. q.6h. p.r.n.   PROCEDURES:  On August 15, 2006, the chest x-ray:  Impression:  1. Nearly resolved left lower lobe atelectasis status post coronary      artery bypass graft.  2. Left effusion, resolved.  No new focal pulmonary opacity.   CONSULTATIONS:  [facility] Dr. Donata Clay.   HOSPITAL COURSE AND PATIENT DISPOSITION:  For details of the initial  part of admission, please refer to the history and physical note that  was done by Dr. Lovell Sheehan .  In summary, Mr. Maske is a 77 year old male  patient who was recently discharged on August 10, 2006 following a  coronary artery bypass graft status post non-ST elevation MI.  The  patient was doing quite well until the day prior to admission when he  was noted to become generally weak with some problems with gait.  The  patient  has had a sore mouth since his discharge with difficulty eating  regular food.  So, he has been consuming more of liquids foods.  With  this presentation, he came to the emergency room where he was also  thought to have possible urinary tract infection because of a history of  fever and chills, and he was admitted for further evaluation and  management.  1. Failure to thrive, possibly secondary to poor p.o. intake secondary      to mild glossitis and presumed urinary tract infection.  The      patient is doing better after IV  fluid hydration and improvement in      his feeding.  Sore mouth improved with magic mouthwash and lozenges  2. Acute on chronic  kidney disease.  On admission, the patient had a      creatinine of 1.7 which is now down to 1.3 post hydration.  This is      probably his baseline creatinine, and this has to be followed up      closely as an outpatient.  3. Hyponatremia secondary to poor p.o. intake has resolved.  4. Anemia which has been stable.  5. Presumed urinary tract infection.  The patient had a history of      fevers and chills.  However, the urine analysis was not convincing.      The patient was started on ciprofloxacin.  He has to complete one      week's course of the same considering that he has had recurrent      urinary tract infections in the past.  6. Status post non-ST elevation MI status post CABG.  The patient's      sternotomy wound was clean and dry.  However, informed [facility] about      the patient's readmission who kindly followed him up with no      further interventions.  7. Diabetes, fairly controlled in the hospital on his medications.      The patient is on  metformin. Considering his chronic kidney disease      to follow him up closely and consider discontinuing it as an      outpatient if necessary or if worsening of his renal function.  8. Dyslipidemia.  9. Postoperative atrial fibrillation.  The patient is to continue his      amiodarone.      Marcellus Scott, MD  Electronically Signed     AH/MEDQ  D:  08/17/2006  T:  08/17/2006  Job:  [id number]   cc:   Salvatore Decent. Cornelius Moras, M.D.  Bruce Elvera Lennox Juanda Chance, MD, [facility]  Kerin Perna, M.D.  Quita Skye Artis Flock, M.D.

## 2010-10-26 NOTE — Consult Note (Signed)
[name], [name] NO.:  [id number]   MEDICAL RECORD NO.:  [id number]          PATIENT TYPE:  INP   LOCATION:  3014                         FACILITY:  [facility]   PHYSICIAN:  Gabrielle Dare. Janee Morn, M.D.DATE OF BIRTH:  03-Mar-1932   DATE OF CONSULTATION:  10/08/2004  DATE OF DISCHARGE:                                   CONSULTATION   REASON FOR CONSULTATION:  Ileus.   HISTORY OF PRESENT ILLNESS:  Patient is a 77 year old male who is status  post lumbar spine surgery by Dr. Jeral Fruit.  Patient developed some abdominal  distention today.  Abdominal films were obtained.  This shows colonic ileus.  Cecum is dilated up to about 16 cm.  There is no free air.  The patient is  having some abdominal discomfort but they placed a rectal tube and he was  able to pass a lot of gas.   PAST MEDICAL HISTORY:  1.  Hyperlipidemia.  2.  Hypertension.  3.  Lumbar spine surgery.  4.  Benign prostatic hypertrophy.   PAST SURGICAL HISTORY:  1.  Umbilical hernia repair x2.  2.  Right inguinal hernia repair.  3.  Lumbar surgery.   ALLERGIES:  PENICILLIN, CODEINE.   REVIEW OF SYSTEMS:  CARDIAC:  Negative.  PULMONARY:  Negative.  GI:  See the  history of present illness but he did pass some gas with manipulation of his  rectal tube tonight with some relief.  MUSCULOSKELETAL:  Negative except for  postoperative discomfort from his lumbar surgery.  NEUROLOGIC:  Negative.   PHYSICAL EXAMINATION:  VITAL SIGNS:  Temperature 100.5, pulse rate 100,  blood pressure 148/83, respirations 20, saturations 96%.  GENERAL:  He is awake and alert.  NECK:  Supple.  LUNGS:  Clear to auscultation.  HEART:  Regular rate and rhythm.  ABDOMEN:  Distended with tympany.  He has some mild tenderness of the mid  abdomen.  There is no guarding.  There is no peritoneal signs.  Bowel sounds  are absent.  EXTREMITIES:  Warm.   X-ray review is as above.   IMPRESSION:  Colonic ileus.   RECOMMENDATIONS:  Bowel  rest and rectal tube decompression.  We will follow  them closely with you and check some follow-up x-rays tomorrow.  Hopefully  this ileus will improve and the colonic distention will decrease.  Thank you  very much for this consultation.  Plan was discussed in detail with the  patient and his wife.      BET/MEDQ  D:  10/08/2004  T:  10/09/2004  Job:  [id number]

## 2010-11-12 ENCOUNTER — Ambulatory Visit (INDEPENDENT_AMBULATORY_CARE_PROVIDER_SITE_OTHER): Payer: Medicare Other | Admitting: Family Medicine

## 2010-11-12 VITALS — BP 124/75 | HR 69

## 2010-11-12 DIAGNOSIS — I2699 Other pulmonary embolism without acute cor pulmonale: Secondary | ICD-10-CM

## 2010-11-13 ENCOUNTER — Other Ambulatory Visit: Payer: Self-pay | Admitting: Family Medicine

## 2010-11-13 DIAGNOSIS — R52 Pain, unspecified: Secondary | ICD-10-CM

## 2010-11-13 DIAGNOSIS — I4891 Unspecified atrial fibrillation: Secondary | ICD-10-CM

## 2010-11-13 MED ORDER — WARFARIN SODIUM 3 MG PO TABS: ORAL_TABLET | ORAL | Status: DC

## 2010-11-13 MED ORDER — DIGOXIN 125 MCG PO TABS: 125.0000 ug | ORAL_TABLET | Freq: Every day | ORAL | Status: DC

## 2010-11-13 MED ORDER — GABAPENTIN 300 MG PO CAPS: 300.0000 mg | ORAL_CAPSULE | Freq: Three times a day (TID) | ORAL | Status: DC

## 2010-11-13 NOTE — Telephone Encounter (Addendum)
Pt walked in to the office this afternoon with 3 refill bottles and needed refills for his gabapentin, Dig, metoprolol.  Then later on this afternoon the [facility] called and is requesting refills for dig, metformin, and warfarin. Plan:  Will review the patient chart and then call [facility] to see actually what pt is due for. Refills were given for digoxin, gabapentin, warfarin.  Metoprolol and metformin refills are not due.  Pt did have a recent office visit  On 09-03-10 for CPE. Jarvis Newcomer, LPN [facility] Dimes

## 2010-11-13 NOTE — Telephone Encounter (Signed)
Pt walked in to the office and had not gotten his refills as he had requested.   Plan:  Will review.

## 2010-12-02 ENCOUNTER — Other Ambulatory Visit: Payer: Self-pay | Admitting: Family Medicine

## 2010-12-03 ENCOUNTER — Other Ambulatory Visit: Payer: Self-pay | Admitting: Family Medicine

## 2010-12-03 ENCOUNTER — Ambulatory Visit (INDEPENDENT_AMBULATORY_CARE_PROVIDER_SITE_OTHER): Payer: Medicare Other | Admitting: Family Medicine

## 2010-12-03 DIAGNOSIS — Z79899 Other long term (current) drug therapy: Secondary | ICD-10-CM

## 2010-12-03 DIAGNOSIS — Z7901 Long term (current) use of anticoagulants: Secondary | ICD-10-CM

## 2010-12-03 DIAGNOSIS — I4891 Unspecified atrial fibrillation: Secondary | ICD-10-CM

## 2010-12-03 LAB — POCT INR: INR: 2.8

## 2010-12-03 MED ORDER — ATORVASTATIN CALCIUM 80 MG PO TABS: 80.0000 mg | ORAL_TABLET | Freq: Every day | ORAL | Status: DC

## 2010-12-17 ENCOUNTER — Other Ambulatory Visit: Payer: Self-pay | Admitting: Family Medicine

## 2010-12-29 ENCOUNTER — Encounter: Payer: Self-pay | Admitting: Family Medicine

## 2010-12-31 ENCOUNTER — Encounter: Payer: Self-pay | Admitting: Family Medicine

## 2010-12-31 ENCOUNTER — Ambulatory Visit (INDEPENDENT_AMBULATORY_CARE_PROVIDER_SITE_OTHER): Payer: Medicare Other | Admitting: Family Medicine

## 2010-12-31 VITALS — BP 119/61 | HR 82 | Ht 65.5 in | Wt 165.0 lb

## 2010-12-31 DIAGNOSIS — E119 Type 2 diabetes mellitus without complications: Secondary | ICD-10-CM

## 2010-12-31 DIAGNOSIS — I25810 Atherosclerosis of coronary artery bypass graft(s) without angina pectoris: Secondary | ICD-10-CM

## 2010-12-31 DIAGNOSIS — E785 Hyperlipidemia, unspecified: Secondary | ICD-10-CM

## 2010-12-31 DIAGNOSIS — F0390 Unspecified dementia without behavioral disturbance: Secondary | ICD-10-CM | POA: Insufficient documentation

## 2010-12-31 DIAGNOSIS — H6120 Impacted cerumen, unspecified ear: Secondary | ICD-10-CM

## 2010-12-31 DIAGNOSIS — I4891 Unspecified atrial fibrillation: Secondary | ICD-10-CM

## 2010-12-31 MED ORDER — ATORVASTATIN CALCIUM 80 MG PO TABS: 80.0000 mg | ORAL_TABLET | Freq: Every day | ORAL | Status: DC

## 2010-12-31 MED ORDER — METFORMIN HCL 500 MG PO TABS: 500.0000 mg | ORAL_TABLET | Freq: Every day | ORAL | Status: DC

## 2010-12-31 MED ORDER — AMBULATORY NON FORMULARY MEDICATION: Status: DC

## 2010-12-31 NOTE — Assessment & Plan Note (Signed)
MMSE score of 25/30.  Passing score of 27.  Discussed that he does have some mild dementia. Discussed strategies to maintain his memory. Using reminders, brain puzzles and doing his work when his mind is the best. He is retiring in about a week from his job.

## 2010-12-31 NOTE — Assessment & Plan Note (Signed)
He is due for a refill on his cholesterol medication. I explained the importance to him getting a cholesterol level checked. He says he will come in, after he retires in about a week.

## 2010-12-31 NOTE — Assessment & Plan Note (Addendum)
Lab Results  Component Value Date   HGBA1C 7.8 12/31/2010   he is not well controlled so we will increase his metformin to twice a day. Followup in 3 months. Encourage regular exercise which he will likely be able to do more often since he is retiring soon. His followup we do need to get a up-to-date microalbumin. I did give him a prescription for a zoster vaccine. He will need a foot exam at his followup visit, that he has known peripheral neuropathy.

## 2010-12-31 NOTE — Progress Notes (Signed)
  Subjective:    Patient ID: [name], male    DOB: [birth year], 77 y.o.   MRN: [id number]  HPI Her for memory testing. He is still walking on a daily basis. He is retiring in about a week. His wife has noticed that he has difficulty doing book work especially by the early afternoon. He tends to do very well in the mornings.  Needs ear irrigation. Says went for hearing test recenlty but had a lot of wax in his ears.  some hearing loss. No pain. He would like to irrigated they can be retested before getting hearing aids.  Over due for labs. Tolerating hs meds well. He does need refills but he has also not been for lab work in over a year. He is also well overdue his diabetic checkup. He says he takes his medications regularly and does need a refill on his metformin. He notes he has been more fatigued lately.   Review of Systems     Objective:   Physical Exam  Constitutional: He is oriented to person, place, and time. He appears well-developed and well-nourished.  HENT:  Head: Normocephalic and atraumatic.  Right Ear: External ear normal.  Left Ear: External ear normal.       Right canal is clear left canal slightly occluded with cerumen but I am able to visualize the TM. Tympanic membranes are normal bilaterally.  Neck: Neck supple. No thyromegaly present.  Cardiovascular: Normal rate and normal heart sounds.        Irregular rhythm  Pulmonary/Chest: Effort normal and breath sounds normal.  Lymphadenopathy:    He has no cervical adenopathy.  Neurological: He is alert and oriented to person, place, and time.  Skin: Skin is warm and dry.  Psychiatric: He has a normal mood and affect.          Assessment & Plan:  Cerumen impaction-his right canal is clear but his left to have a femoral wax. We did irrigate the ear clean it out.

## 2010-12-31 NOTE — Assessment & Plan Note (Signed)
He is due for an INR today. We are currently out of strips so we're sending him to the lab downstairs. We will call him later this evening with his dose adjustment.

## 2010-12-31 NOTE — Patient Instructions (Signed)
I want you to get your shingles vaccine at the pharmacy. Please see the attached rx. Have them send me a note when you get this done.  Follow up in 3 months.

## 2011-01-01 ENCOUNTER — Telehealth: Payer: Self-pay | Admitting: *Deleted

## 2011-01-01 ENCOUNTER — Ambulatory Visit: Payer: Self-pay | Admitting: Family Medicine

## 2011-01-01 LAB — COMPLETE METABOLIC PANEL WITH GFR
AST: 13 U/L (ref 0–37)
Albumin: 4 g/dL (ref 3.5–5.2)
Alkaline Phosphatase: 70 U/L (ref 39–117)
BUN: 17 mg/dL (ref 6–23)
Calcium: 8.9 mg/dL (ref 8.4–10.5)
Creat: 1.01 mg/dL (ref 0.50–1.35)
GFR, Est Non African American: >60 mL/min (ref >60)
Glucose, Bld: 111 mg/dL — ABNORMAL HIGH (ref 70–99)

## 2011-01-01 LAB — PROTIME-INR
INR: 1.92 — ABNORMAL HIGH (ref <1.50)
Prothrombin Time: 22.6 seconds — ABNORMAL HIGH (ref 11.6–15.2)

## 2011-01-01 MED ORDER — RAMIPRIL 10 MG PO CAPS: 10.0000 mg | ORAL_CAPSULE | Freq: Every day | ORAL | Status: DC

## 2011-01-01 NOTE — Telephone Encounter (Signed)
Ok sent refill on the ramipril.  Already sent the lipitor yesterday

## 2011-01-01 NOTE — Telephone Encounter (Addendum)
Spoke with pts spouse and notified her of inr results. Also she states she was to supposed to let you know about medications that pt was taking. He is still taking ramipril and atorvastatin

## 2011-01-01 NOTE — Telephone Encounter (Signed)
Called and left message for pt to call back re coumadin instructions

## 2011-01-15 ENCOUNTER — Other Ambulatory Visit: Payer: Self-pay | Admitting: Cardiology

## 2011-01-15 DIAGNOSIS — I714 Abdominal aortic aneurysm, without rupture: Secondary | ICD-10-CM

## 2011-01-16 ENCOUNTER — Encounter (INDEPENDENT_AMBULATORY_CARE_PROVIDER_SITE_OTHER): Payer: Medicare Other | Admitting: Cardiology

## 2011-01-16 DIAGNOSIS — I714 Abdominal aortic aneurysm, without rupture: Secondary | ICD-10-CM

## 2011-01-16 DIAGNOSIS — I700 Atherosclerosis of aorta: Secondary | ICD-10-CM

## 2011-01-16 DIAGNOSIS — I723 Aneurysm of iliac artery: Secondary | ICD-10-CM

## 2011-01-17 ENCOUNTER — Encounter: Payer: Self-pay | Admitting: Cardiovascular Disease

## 2011-01-18 ENCOUNTER — Other Ambulatory Visit: Payer: Self-pay | Admitting: Family Medicine

## 2011-02-07 ENCOUNTER — Other Ambulatory Visit: Payer: Self-pay | Admitting: Family Medicine

## 2011-03-01 ENCOUNTER — Ambulatory Visit (INDEPENDENT_AMBULATORY_CARE_PROVIDER_SITE_OTHER): Payer: Medicare Other | Admitting: Family Medicine

## 2011-03-01 VITALS — BP 127/63 | HR 56

## 2011-03-01 DIAGNOSIS — I4891 Unspecified atrial fibrillation: Secondary | ICD-10-CM

## 2011-03-01 LAB — POCT INR: INR: 2.5

## 2011-03-01 NOTE — Progress Notes (Signed)
  Subjective:    Patient ID: [name], male    DOB: [birth year], 78 y.o.   MRN: [id number]  HPI INR check    Review of Systems     Objective:   Physical Exam        Assessment & Plan:

## 2011-03-15 ENCOUNTER — Encounter: Payer: Self-pay | Admitting: Family Medicine

## 2011-03-15 ENCOUNTER — Ambulatory Visit (INDEPENDENT_AMBULATORY_CARE_PROVIDER_SITE_OTHER): Payer: Medicare Other | Admitting: Family Medicine

## 2011-03-15 VITALS — BP 113/65 | HR 65 | Wt 162.0 lb

## 2011-03-15 DIAGNOSIS — R29898 Other symptoms and signs involving the musculoskeletal system: Secondary | ICD-10-CM

## 2011-03-15 DIAGNOSIS — E119 Type 2 diabetes mellitus without complications: Secondary | ICD-10-CM

## 2011-03-15 DIAGNOSIS — Z23 Encounter for immunization: Secondary | ICD-10-CM

## 2011-03-15 DIAGNOSIS — G609 Hereditary and idiopathic neuropathy, unspecified: Secondary | ICD-10-CM

## 2011-03-15 DIAGNOSIS — N289 Disorder of kidney and ureter, unspecified: Secondary | ICD-10-CM

## 2011-03-15 DIAGNOSIS — I4891 Unspecified atrial fibrillation: Secondary | ICD-10-CM

## 2011-03-15 DIAGNOSIS — G589 Mononeuropathy, unspecified: Secondary | ICD-10-CM

## 2011-03-15 DIAGNOSIS — G629 Polyneuropathy, unspecified: Secondary | ICD-10-CM

## 2011-03-15 DIAGNOSIS — M6281 Muscle weakness (generalized): Secondary | ICD-10-CM

## 2011-03-15 LAB — POCT UA - MICROALBUMIN

## 2011-03-15 MED ORDER — METFORMIN HCL 500 MG PO TABS: 500.0000 mg | ORAL_TABLET | Freq: Two times a day (BID) | ORAL | Status: DC

## 2011-03-15 NOTE — Patient Instructions (Signed)
We will call you with the home health referral.

## 2011-03-15 NOTE — Progress Notes (Signed)
Subjective:    Patient ID: [name], male    DOB: January 24, 1932, 77 y.o.   MRN: [id number]  HPI DM- Sugars have been runnin 160-180 over the last months.  He is taking his metformin regularly. We had seen him back at the end of July and he was supposed to increase his metformin. Unfortunately he does was changed to 60 tabs per month with instructions were not corrected to twice a day so he has continued to take them once a day.  Right leg weakness _ Hx of back and hip surgery-he says it's been getting weak for quite some time and he has not driven in a while because of this. He is not sure if this is related to the numbness and tingling in his foot or not. It is enough to have help to get up and down from a chair. He also needs assistance getting in and out of a car now because he cannot seem to lift his hip. He often uses his opposite hand to help with his knee out. No significant pain in his hip. There he did recently see his urologist to diagnosis prostate cancer to make sure that this wasn't a recurrence of his prostate cancer and it was not. His PSA levels were evidently normal.  Peripheral neuropathy - Will check B12 but likely secondary to his DM. Need to get better control. Has numbness in tinglig in both feet but worse on the right.    Review of Systems     BP 113/65  Pulse 65  Wt 162 lb (73.483 kg)    Allergies  Allergen Reactions  . Codeine   . Penicillins     Past Medical History  Diagnosis Date  . A-fib   . CAD (coronary artery disease)   . Hyperlipidemia   . Diabetes mellitus   . Hypertension   . Suprapubic catheter     Past Surgical History  Procedure Date  . Prostatectomy     radiation therapy  . Bladder cath   . Laminectomy     lumbar  . Lumbar disc surgery   . Right colectomy   . Incisional hernia repair   . Oronary artery bypass   . Total hip arthroplasty     RT    History   Social History  . Marital Status: Married    Spouse Name: N/A   Number of Children: N/A  . Years of Education: N/A   Occupational History  . Not on file.   Social History Main Topics  . Smoking status: Former Smoker    Types: Cigarettes    Quit date: 06/10/1970  . Smokeless tobacco: Not on file  . Alcohol Use: No  . Drug Use:   . Sexually Active:    Other Topics Concern  . Not on file   Social History Narrative  . No narrative on file    Family History  Problem Relation Age of Onset  . Coronary artery disease      siblings  . Heart attack Father   . Diabetes Sister   . Hyperlipidemia      family history    [name] had no medications administered during this visit. Current outpatient prescriptions:ACCU-CHEK AVIVA test strip, TEST UP TO TWICE DAILY, Disp: 100 each, Rfl: 0;  AMBULATORY NON FORMULARY MEDICATION, Medication Name: Shingles vaccine IM x 1, Disp: 1 vial, Rfl: 0;  amitriptyline (ELAVIL) 50 MG tablet, Take 50 mg by mouth at bedtime.  , Disp: ,  Rfl: ;  atorvastatin (LIPITOR) 80 MG tablet, Take 1 tablet (80 mg total) by mouth daily., Disp: 30 tablet, Rfl: 6 Blood Glucose Monitoring Suppl MISC,  , Disp: , Rfl: ;  digoxin (LANOXIN) 0.125 MG tablet, TAKE ONE TABLET BY MOUTH EVERY DAY, Disp: 30 tablet, Rfl: 2;  fish oil-omega-3 fatty acids 1000 MG capsule, Take 1 g by mouth daily.  , Disp: , Rfl: ;  gabapentin (NEURONTIN) 300 MG capsule, Take 1 capsule (300 mg total) by mouth 3 (three) times daily., Disp: 90 capsule, Rfl: 2 metFORMIN (GLUCOPHAGE) 500 MG tablet, Take 1 tablet (500 mg total) by mouth 2 (two) times daily with a meal., Disp: 60 tablet, Rfl: 6;  metoprolol (TOPROL-XL) 50 MG 24 hr tablet, TAKE TWO TABLETS BY MOUTH TWICE DAILY, Disp: 60 tablet, Rfl: 3;  omeprazole (PRILOSEC OTC) 20 MG tablet, Take 20 mg by mouth daily.  , Disp: , Rfl: ;  ramipril (ALTACE) 10 MG capsule, Take 1 capsule (10 mg total) by mouth daily., Disp: 90 capsule, Rfl: 1 warfarin (COUMADIN) 3 MG tablet, Take 3 mg on all days of week except Wed at which you will  take (1/2) of a 3 mg PO., Disp: 40 tablet, Rfl: 2  Objective:   Physical Exam  Constitutional: He is oriented to person, place, and time. He appears well-developed and well-nourished.  HENT:  Head: Normocephalic and atraumatic.  Cardiovascular: Normal rate, regular rhythm and normal heart sounds.   Pulmonary/Chest: Effort normal and breath sounds normal.  Musculoskeletal:       Hip strength is 3/5 wrist flexion, ulnar right. Knee, ankle strength is 5 over 5 on the right.  Neurological: He is alert and oriented to person, place, and time.  Skin: Skin is warm and dry.  Psychiatric: He has a normal mood and affect. His behavior is normal.          Assessment & Plan:  DM- will inc metformin to bid. F.U in 2 months for next A1C. He was supposed to increase to BID back in July but evidently didn't.   Peripheral neuropathy-he is starting on Neurontin twice a day. This is unknown diagnosis but for some reason he felt like this was possibly a new diagnosis for the nurse from his insurance company cannot do an evaluation. I explained to his wife that that is actually what the Neurontin is for is for his neuropathy. If you would like to go up to 3 times a day to see this better controls his symptoms he can. Monitor for sedation as this is a potential side effect of going up on his toes. I also discussed the importance of controlling his blood sugars to reduce the progression of his neuropathy. We will also check a vitamin B12 to make sure she is not deficient since he has been taking metformin for quite some time.  Right hip weakness-like to try physical therapy. He would like to try home physical therapy since he is unable to drive and is homebound. He's not making any significant improvement we can consider an orthopedic referral. He has had a prior history of hip and back surgery but the weakness has been progressive over the last year.

## 2011-03-16 LAB — VITAMIN B12: Vitamin B-12: 327 pg/mL (ref 211–911)

## 2011-03-18 ENCOUNTER — Telehealth: Payer: Self-pay | Admitting: *Deleted

## 2011-03-18 NOTE — Telephone Encounter (Signed)
Message copied by Lanae Crumbly on Mon Mar 18, 2011  9:33 AM ------      Message from: [name] D      Created: Sat Mar 16, 2011 11:31 AM       B12 looks OK.  He should get a call this week about physical therapy in the home.

## 2011-03-18 NOTE — Telephone Encounter (Signed)
Pt notified of results via VM. KJ LPN 

## 2011-04-03 ENCOUNTER — Encounter: Payer: Self-pay | Admitting: Family Medicine

## 2011-04-03 ENCOUNTER — Ambulatory Visit: Payer: Self-pay | Admitting: Family Medicine

## 2011-04-03 ENCOUNTER — Ambulatory Visit (INDEPENDENT_AMBULATORY_CARE_PROVIDER_SITE_OTHER): Payer: Medicare Other | Admitting: Family Medicine

## 2011-04-03 DIAGNOSIS — I4891 Unspecified atrial fibrillation: Secondary | ICD-10-CM

## 2011-04-03 DIAGNOSIS — D179 Benign lipomatous neoplasm, unspecified: Secondary | ICD-10-CM

## 2011-04-03 DIAGNOSIS — E119 Type 2 diabetes mellitus without complications: Secondary | ICD-10-CM

## 2011-04-03 DIAGNOSIS — I2699 Other pulmonary embolism without acute cor pulmonale: Secondary | ICD-10-CM

## 2011-04-03 DIAGNOSIS — Z23 Encounter for immunization: Secondary | ICD-10-CM

## 2011-04-03 LAB — COMPLETE METABOLIC PANEL WITH GFR
ALT: 17 U/L (ref 0–53)
CO2: 24 mEq/L (ref 19–32)
Creat: 0.96 mg/dL (ref 0.50–1.35)
GFR, Est African American: 88 mL/min — ABNORMAL LOW (ref >90)
GFR, Est Non African American: 76 mL/min — ABNORMAL LOW (ref >90)
Glucose, Bld: 126 mg/dL — ABNORMAL HIGH (ref 70–99)
Total Bilirubin: 0.6 mg/dL (ref 0.3–1.2)

## 2011-04-03 LAB — LIPID PANEL
Cholesterol: 121 mg/dL (ref 0–200)
HDL: 30 mg/dL — ABNORMAL LOW (ref >39)
Triglycerides: 250 mg/dL — ABNORMAL HIGH (ref <150)

## 2011-04-03 LAB — POCT INR: INR: 2.2

## 2011-04-03 NOTE — Progress Notes (Signed)
Subjective:    Patient ID: [name], male    DOB: 08-31-1931, 77 y.o.   MRN: [id number]  HPI Right Hip Weakness- He has started home PT. Has had 3 session thus far and feels it is helping.   DM- tolerating the higher metformin well. No SE.  Says sugars have been better overall. No lows. Home sugars are running 129-182, much better.  Did get a new glucometer.  Cataract surgery coming up.   Knot on the top of the left foot. Started 2 weeks ago. Not painful but seems to be growing.    INR- NO excess bleeding. Taking his coumadin regularly.    Review of Systems  BP 170/72  Pulse 51  Wt 156 lb (70.761 kg)    Allergies  Allergen Reactions  . Codeine   . Penicillins     Past Medical History  Diagnosis Date  . A-fib   . CAD (coronary artery disease)   . Hyperlipidemia   . Diabetes mellitus   . Hypertension   . Suprapubic catheter     Past Surgical History  Procedure Date  . Prostatectomy     radiation therapy  . Bladder cath   . Laminectomy     lumbar  . Lumbar disc surgery   . Right colectomy   . Incisional hernia repair   . Oronary artery bypass   . Total hip arthroplasty     RT    History   Social History  . Marital Status: Married    Spouse Name: N/A    Number of Children: N/A  . Years of Education: N/A   Occupational History  . Not on file.   Social History Main Topics  . Smoking status: Former Smoker    Types: Cigarettes    Quit date: 06/10/1970  . Smokeless tobacco: Not on file  . Alcohol Use: No  . Drug Use:   . Sexually Active:    Other Topics Concern  . Not on file   Social History Narrative  . No narrative on file    Family History  Problem Relation Age of Onset  . Coronary artery disease      siblings  . Heart attack Father   . Diabetes Sister   . Hyperlipidemia      family history  Current outpatient prescriptions:ACCU-CHEK AVIVA test strip, TEST UP TO TWICE DAILY, Disp: 100 each, Rfl: 0;  AMBULATORY NON FORMULARY  MEDICATION, Medication Name: Shingles vaccine IM x 1, Disp: 1 vial, Rfl: 0;  amitriptyline (ELAVIL) 50 MG tablet, Take 50 mg by mouth at bedtime.  , Disp: , Rfl: ;  atorvastatin (LIPITOR) 80 MG tablet, Take 1 tablet (80 mg total) by mouth daily., Disp: 30 tablet, Rfl: 6 Blood Glucose Monitoring Suppl MISC,  , Disp: , Rfl: ;  digoxin (LANOXIN) 0.125 MG tablet, TAKE ONE TABLET BY MOUTH EVERY DAY, Disp: 30 tablet, Rfl: 2;  fish oil-omega-3 fatty acids 1000 MG capsule, Take 1 g by mouth daily.  , Disp: , Rfl: ;  gabapentin (NEURONTIN) 300 MG capsule, Take 1 capsule (300 mg total) by mouth 3 (three) times daily., Disp: 90 capsule, Rfl: 2 metFORMIN (GLUCOPHAGE) 500 MG tablet, Take 1 tablet (500 mg total) by mouth 2 (two) times daily with a meal., Disp: 60 tablet, Rfl: 6;  metoprolol (TOPROL-XL) 50 MG 24 hr tablet, TAKE TWO TABLETS BY MOUTH TWICE DAILY, Disp: 60 tablet, Rfl: 3;  omeprazole (PRILOSEC OTC) 20 MG tablet, Take 20 mg by mouth daily.  ,  Disp: , Rfl: ;  ramipril (ALTACE) 10 MG capsule, Take 1 capsule (10 mg total) by mouth daily., Disp: 90 capsule, Rfl: 1 warfarin (COUMADIN) 3 MG tablet, Take 3 mg on all days of week except Wed at which you will take (1/2) of a 3 mg PO., Disp: 40 tablet, Rfl: 2     Objective:   Physical Exam  Constitutional: He is oriented to person, place, and time. He appears well-developed and well-nourished.  HENT:  Head: Normocephalic and atraumatic.  Cardiovascular: Normal rate, regular rhythm and normal heart sounds.   Pulmonary/Chest: Effort normal and breath sounds normal.  Neurological: He is alert and oriented to person, place, and time.  Skin: Skin is warm and dry.  Psychiatric: He has a normal mood and affect. His behavior is normal.          Assessment & Plan:  Right hip weakness- Has started PT and does seem to be helping. I would like to reevaluate him in a month to see these make any significant improvement.  Lipoma -because he only recently resolve the  lesion and he feels like it has grown in the last week or 2-90 recommend removal. Refer to surgery for further evaluation and removal.   Anticoag - See flow sheet for adjustements.   DM- Well controlled. F/U in 3 mo. tolerating higher dose of metformin well.   Elevated blood pressure-normal his blood pressures well-controlled but he did not take his medications this morning as he was fasting and says he prefers to take them with food.

## 2011-04-03 NOTE — Patient Instructions (Signed)
A1C is great today. Keep up the good work. I think the change in the metformin has really helped him,

## 2011-04-04 ENCOUNTER — Telehealth: Payer: Self-pay | Admitting: Family Medicine

## 2011-04-04 ENCOUNTER — Telehealth: Payer: Self-pay | Admitting: *Deleted

## 2011-04-04 NOTE — Telephone Encounter (Signed)
Left message on vm with results  

## 2011-04-04 NOTE — Telephone Encounter (Signed)
Message copied by Wyline Beady on Thu Apr 04, 2011  8:45 AM ------      Message from: [name] D      Created: Thu Apr 04, 2011  7:28 AM       Complete metabolic panel is normal, except for glucose being slightly elevated. Her kidney function is stable. Her sodium looks great. Total cholesterol and LDL looked great. Her triglycerides are still elevated at 250. Cane he increase his fish oil to 4 tabs daily? This will help lower lower triglycerides. Recheck triglycerides in 6 months. Recheck for cholesterol panel in one year. Thyroid looks great.

## 2011-04-08 NOTE — Telephone Encounter (Signed)
Closed

## 2011-04-11 ENCOUNTER — Telehealth: Payer: Self-pay | Admitting: Family Medicine

## 2011-04-11 NOTE — Telephone Encounter (Signed)
Called a verbal order and had to [facility] for [name] at [facility]. Jarvis Newcomer, LPN Domingo Dimes

## 2011-04-11 NOTE — Telephone Encounter (Signed)
OK for verbal order 

## 2011-04-11 NOTE — Telephone Encounter (Signed)
[name] with [facility] calling requesting a order for pt to continue PT twice a week for another 3 weeks starting next week.  Please advise if a verbal order or written order given. Plan:  Routed to Dr. Marlyne Beards, LPN Domingo Dimes

## 2011-04-16 ENCOUNTER — Encounter: Payer: Self-pay | Admitting: Family Medicine

## 2011-04-16 ENCOUNTER — Ambulatory Visit (INDEPENDENT_AMBULATORY_CARE_PROVIDER_SITE_OTHER): Payer: Medicare Other | Admitting: Family Medicine

## 2011-04-16 VITALS — BP 129/63 | HR 69 | Ht 66.5 in | Wt 156.0 lb

## 2011-04-16 DIAGNOSIS — Z0000 Encounter for general adult medical examination without abnormal findings: Secondary | ICD-10-CM

## 2011-04-16 DIAGNOSIS — I10 Essential (primary) hypertension: Secondary | ICD-10-CM

## 2011-04-16 NOTE — Progress Notes (Addendum)
Subjective:    [name] is a 77 y.o. male who presents for Medicare Annual/Subsequent preventive examination.   Preventive Screening-Counseling & Management  Tobacco History  Smoking status  . Former Smoker  . Types: Cigarettes  . Quit date: 06/10/1970  Smokeless tobacco  . Not on file    Problems Prior to Visit 1. Dr. Barron Alvine (Urology)  Current Problems (verified) Patient Active Problem List  Diagnoses  . DIABETES MELLITUS, TYPE II  . DYSLIPIDEMIA  . HYPERTENSION, UNSPECIFIED  . MYOCARDIAL INFARCTION, HX OF  . CAD, ARTERY BYPASS GRAFT  . ATRIAL FIBRILLATION  . ABDOMINAL ANEURYSM WITHOUT MENTION OF RUPTURE  . KIDNEY DISEASE  . DEGENERATIVE JOINT DISEASE  . PROSTATE CANCER, HX OF  . PERIPHERAL NEUROPATHY  . Dementia    Medications Prior to Visit Current Outpatient Prescriptions on File Prior to Visit  Medication Sig Dispense Refill  . ACCU-CHEK AVIVA test strip TEST UP TO TWICE DAILY  100 each  0  . AMBULATORY NON FORMULARY MEDICATION Medication Name: Shingles vaccine IM x 1  1 vial  0  . amitriptyline (ELAVIL) 50 MG tablet Take 50 mg by mouth at bedtime.        Marland Kitchen atorvastatin (LIPITOR) 80 MG tablet Take 1 tablet (80 mg total) by mouth daily.  30 tablet  6  . Blood Glucose Monitoring Suppl MISC        . digoxin (LANOXIN) 0.125 MG tablet TAKE ONE TABLET BY MOUTH EVERY DAY  30 tablet  2  . fish oil-omega-3 fatty acids 1000 MG capsule Take 1 g by mouth daily.        Marland Kitchen gabapentin (NEURONTIN) 300 MG capsule Take 1 capsule (300 mg total) by mouth 3 (three) times daily.  90 capsule  2  . metFORMIN (GLUCOPHAGE) 500 MG tablet Take 1 tablet (500 mg total) by mouth 2 (two) times daily with a meal.  60 tablet  6  . metoprolol (TOPROL-XL) 50 MG 24 hr tablet TAKE TWO TABLETS BY MOUTH TWICE DAILY  60 tablet  3  . omeprazole (PRILOSEC OTC) 20 MG tablet Take 20 mg by mouth daily.        . ramipril (ALTACE) 10 MG capsule Take 1 capsule (10 mg total) by mouth daily.  90  capsule  1  . warfarin (COUMADIN) 3 MG tablet Take 3 mg on all days of week except Wed at which you will take (1/2) of a 3 mg PO.  40 tablet  2    Current Medications (verified) Current Outpatient Prescriptions  Medication Sig Dispense Refill  . ACCU-CHEK AVIVA test strip TEST UP TO TWICE DAILY  100 each  0  . AMBULATORY NON FORMULARY MEDICATION Medication Name: Shingles vaccine IM x 1  1 vial  0  . amitriptyline (ELAVIL) 50 MG tablet Take 50 mg by mouth at bedtime.        Marland Kitchen atorvastatin (LIPITOR) 80 MG tablet Take 1 tablet (80 mg total) by mouth daily.  30 tablet  6  . Blood Glucose Monitoring Suppl MISC        . digoxin (LANOXIN) 0.125 MG tablet TAKE ONE TABLET BY MOUTH EVERY DAY  30 tablet  2  . fish oil-omega-3 fatty acids 1000 MG capsule Take 1 g by mouth daily.        Marland Kitchen gabapentin (NEURONTIN) 300 MG capsule Take 1 capsule (300 mg total) by mouth 3 (three) times daily.  90 capsule  2  . metFORMIN (GLUCOPHAGE) 500 MG  tablet Take 1 tablet (500 mg total) by mouth 2 (two) times daily with a meal.  60 tablet  6  . metoprolol (TOPROL-XL) 50 MG 24 hr tablet TAKE TWO TABLETS BY MOUTH TWICE DAILY  60 tablet  3  . omeprazole (PRILOSEC OTC) 20 MG tablet Take 20 mg by mouth daily.        . ramipril (ALTACE) 10 MG capsule Take 1 capsule (10 mg total) by mouth daily.  90 capsule  1  . warfarin (COUMADIN) 3 MG tablet Take 3 mg on all days of week except Wed at which you will take (1/2) of a 3 mg PO.  40 tablet  2     Allergies (verified) Codeine and Penicillins   PAST HISTORY  Family History Family History  Problem Relation Age of Onset  . Coronary artery disease      siblings  . Heart attack Father   . Diabetes Sister   . Hyperlipidemia      family history    Social History History  Substance Use Topics  . Smoking status: Former Smoker    Types: Cigarettes    Quit date: 06/10/1970  . Smokeless tobacco: Not on file  . Alcohol Use: No    Are there smokers in your home (other than  you)?  No  Risk Factors Current exercise habits: Home exercise routine includes working with PT.  .  Dietary issues discussed: Diet is fair   Cardiac risk factors: advanced age (older than 47 for men, 84 for women) and HTN, hyperlipidemia.  .  Depression Screen (Note: if answer to either of the following is "Yes", a more complete depression screening is indicated)   Q1: Over the past two weeks, have you felt down, depressed or hopeless? No  Q2: Over the past two weeks, have you felt little interest or pleasure in doing things? No  Have you lost interest or pleasure in daily life? No  Do you often feel hopeless? No  Do you cry easily over simple problems? No  Activities of Daily Living In your present state of health, do you have any difficulty performing the following activities?:  Driving? Yes Managing money?  No Feeding yourself? No Getting from bed to chair? No Climbing a flight of stairs? Yes. Hs buit a ramp for the steps into the house. Preparing food and eating?: No Bathing or showering? No Getting dressed: No Getting to the toilet? No Using the toilet:No Moving around from place to place: No In the past year have you fallen or had a near fall?:Yes. Slipped in the kitchen but didn't acutally hit the floor.    Are you sexually active?  No  Do you have more than one partner?  No  Hearing Difficulties: Yes Do you often ask people to speak up or repeat themselves? Yes Do you experience ringing or noises in your ears? Yes Do you have difficulty understanding soft or whispered voices? Yes   Do you feel that you have a problem with memory? No  Do you often misplace items? No  Do you feel safe at home?  Yes  Cognitive Testing  Alert? Yes  Normal Appearance?Yes  Oriented to person? Yes  Place? Yes   Time? No, thought it was 2010.   Recall of three objects?  Yes  Can perform simple calculations? Yes  Displays appropriate judgment?Yes  Can read the correct time from a  watch face?Yes   Advanced Directives have been discussed with the patient? Yes  List the Names of Other Physician/Practitioners you currently use: 1.    Indicate any recent Medical Services you may have received from other than [facility] providers in the past year (date may be approximate).  Immunization History  Administered Date(s) Administered  . Influenza Split 03/15/2011  . Influenza Whole 03/10/2009, 02/22/2010  . Pneumococcal Polysaccharide 06/11/2007  . Tdap 04/03/2011    Screening Tests Health Maintenance  Topic Date Due  . Hemoglobin A1c  10/02/2011  . Influenza Vaccine  03/10/2012  . Urine Microalbumin  03/14/2012  . Ophthalmology Exam  03/27/2012  . Foot Exam  04/02/2012  . Colonoscopy  04/02/2021  . Tetanus/tdap  04/02/2021  . Pneumococcal Polysaccharide Vaccine Age 14 And Over  Completed  . Zostavax  Addressed    All answers were reviewed with the patient and necessary referrals were made:  METHENEY,CATHERINE, MD   04/16/2011   History reviewed: allergies, current medications, past family history, past medical history, past social history, past surgical history and problem list  Review of Systems A comprehensive review of systems was negative.    Objective:     Vision by Snellen chart: right eye:20/30, left eye:20/40 Blood pressure 129/63, pulse 69, height 5' 6.5" (1.689 m), weight 156 lb (70.761 kg). Body mass index is 24.80 kg/(m^2).  BP 129/63  Pulse 69  Ht 5' 6.5" (1.689 m)  Wt 156 lb (70.761 kg)  BMI 24.80 kg/m2  General Appearance:    Alert, cooperative, no distress, appears stated age  Head:    Normocephalic, without obvious abnormality, atraumatic  Eyes:    Right pupil is smaller than the left, conjunctiva/corneas clear, EOM's intact both eyes       Ears:    Normal TM's and external ear canals, both ears  Nose:   Nares normal, septum midline, mucosa normal, no drainage    or sinus tenderness  Throat:   Lips, mucosa, and tongue normal; teeth and  gums normal  Neck:   Supple, symmetrical, trachea midline, no adenopathy;       thyroid:  No enlargement/tenderness/nodules; no carotid   bruit or JVD  Back:     Symmetric, no curvature, ROM normal, no CVA tenderness  Lungs:     Clear to auscultation bilaterally, respirations unlabored  Chest wall:    No tenderness or deformity  Heart:    Regular rate and rhythm, S1 and S2 normal, no murmur, rub   or gallop  Abdomen:     Soft, non-tender, bowel sounds active all four quadrants,    no masses, no organomegaly  Genitalia:    Normal male without lesion, discharge or tenderness  Rectal:    Normal tone, normal prostate, no masses or tenderness;   guaiac negative stool  Extremities:   Extremities normal, atraumatic, no cyanosis or edema  Pulses:   2+ and symmetric all extremities  Skin:   Skin color, texture, turgor normal, no rashes or lesions  Lymph nodes:   Cervical, supraclavicular, and axillary nodes normal  Neurologic:   CNII-XII intact. Normal strength, sensation and reflexes      throughout       Assessment:     Annual Wellness medicare Exam.      Plan:     During the course of the visit the patient was educated and counseled about appropriate screening and preventive services including:    Advanced directives: Encouraged him to work on getting these with his wife.   Diet review for nutrition referral? Yes ____  Not Indicated __x_  Patient Instructions (the written plan) was given to the patient.  Medicare Attestation I have personally reviewed: The patient's medical and social history Their use of alcohol, tobacco or illicit drugs Their current medications and supplements The patient's functional ability including ADLs,fall risks, home safety risks, cognitive, and hearing and visual impairment Diet and physical activities Evidence for depression or mood disorders  The patient's weight, height, BMI, and visual acuity have been recorded in the chart.  I have made  referrals, counseling, and provided education to the patient based on review of the above and I have provided the patient with a written personalized care plan for preventive services.     METHENEY,CATHERINE, MD   04/16/2011

## 2011-04-16 NOTE — Patient Instructions (Signed)
Make sure dong your exercises and make sure you are eating a healthy diet Try to eat 4 servings of dairy a day or take a calcium supplement (500mg  twice a day). Your vaccines are up to date.

## 2011-04-24 ENCOUNTER — Ambulatory Visit: Payer: Medicare Other

## 2011-04-25 ENCOUNTER — Ambulatory Visit (INDEPENDENT_AMBULATORY_CARE_PROVIDER_SITE_OTHER): Payer: Medicare Other | Admitting: Family Medicine

## 2011-04-25 VITALS — BP 126/73 | HR 58

## 2011-04-25 DIAGNOSIS — Z5181 Encounter for therapeutic drug level monitoring: Secondary | ICD-10-CM

## 2011-04-25 DIAGNOSIS — I4891 Unspecified atrial fibrillation: Secondary | ICD-10-CM

## 2011-04-25 DIAGNOSIS — Z7901 Long term (current) use of anticoagulants: Secondary | ICD-10-CM

## 2011-04-25 NOTE — Progress Notes (Signed)
  Subjective:    Patient ID: [name], male    DOB: July 09, 1931, 77 y.o.   MRN: [id number] PT/INR check HPI    Review of Systems     Objective:   Physical Exam        Assessment & Plan:

## 2011-05-01 ENCOUNTER — Telehealth: Payer: Self-pay | Admitting: *Deleted

## 2011-05-01 ENCOUNTER — Encounter (INDEPENDENT_AMBULATORY_CARE_PROVIDER_SITE_OTHER): Payer: Self-pay | Admitting: Surgery

## 2011-05-01 ENCOUNTER — Ambulatory Visit (INDEPENDENT_AMBULATORY_CARE_PROVIDER_SITE_OTHER): Payer: Medicare Other | Admitting: Family Medicine

## 2011-05-01 DIAGNOSIS — Z7901 Long term (current) use of anticoagulants: Secondary | ICD-10-CM

## 2011-05-01 DIAGNOSIS — I4891 Unspecified atrial fibrillation: Secondary | ICD-10-CM

## 2011-05-01 DIAGNOSIS — R2240 Localized swelling, mass and lump, unspecified lower limb: Secondary | ICD-10-CM

## 2011-05-01 DIAGNOSIS — Z5181 Encounter for therapeutic drug level monitoring: Secondary | ICD-10-CM

## 2011-05-01 LAB — POCT INR: INR: 1.3

## 2011-05-01 NOTE — Telephone Encounter (Signed)
Pt states that he was scheduled for foot surgery today but the doctor he was scheduled w/ doesn't do that type of surgery. Pt needs referred to another doctor that does foot surgery. Please advise.

## 2011-05-01 NOTE — Progress Notes (Signed)
  Subjective:    Patient ID: [name], male    DOB: [birth year], 77 y.o.   MRN: [id number]  HPI Pt here for INR    Review of Systems     Objective:   Physical Exam        Assessment & Plan:

## 2011-05-01 NOTE — Telephone Encounter (Signed)
Detailed in referral for orthopedist. Tell him I apologize for the inconvenience.

## 2011-05-06 ENCOUNTER — Ambulatory Visit: Payer: Medicare Other | Admitting: Family Medicine

## 2011-05-13 ENCOUNTER — Ambulatory Visit (INDEPENDENT_AMBULATORY_CARE_PROVIDER_SITE_OTHER): Payer: Medicare Other | Admitting: Family Medicine

## 2011-05-13 VITALS — BP 141/73 | HR 67

## 2011-05-13 DIAGNOSIS — I4891 Unspecified atrial fibrillation: Secondary | ICD-10-CM

## 2011-05-13 LAB — POCT INR: INR: 1.2

## 2011-05-13 NOTE — Progress Notes (Signed)
Addended by: [name] D on: 05/13/2011 05:42 PM   Modules accepted: Level of Service

## 2011-05-13 NOTE — Progress Notes (Signed)
  Subjective:    Patient ID: [name], male    DOB: 07-26-1931, 77 y.o.   MRN: [id number]  HPI Check INR for coumadin therapy    Review of Systems     Objective:   Physical Exam        Assessment & Plan:

## 2011-05-14 ENCOUNTER — Other Ambulatory Visit: Payer: Self-pay | Admitting: Family Medicine

## 2011-05-18 ENCOUNTER — Other Ambulatory Visit: Payer: Self-pay | Admitting: Family Medicine

## 2011-05-20 ENCOUNTER — Ambulatory Visit (INDEPENDENT_AMBULATORY_CARE_PROVIDER_SITE_OTHER): Payer: Medicare Other | Admitting: Family Medicine

## 2011-05-20 VITALS — BP 112/57 | HR 65 | Wt 154.0 lb

## 2011-05-20 DIAGNOSIS — I4891 Unspecified atrial fibrillation: Secondary | ICD-10-CM

## 2011-05-20 DIAGNOSIS — I749 Embolism and thrombosis of unspecified artery: Secondary | ICD-10-CM

## 2011-05-20 LAB — POCT INR: INR: 3

## 2011-05-20 NOTE — Progress Notes (Signed)
  Subjective:    Patient ID: [name], male    DOB: January 19, 1932, 77 y.o.   MRN: [id number] INR check HPI    Review of Systems     Objective:   Physical Exam        Assessment & Plan:

## 2011-05-22 ENCOUNTER — Other Ambulatory Visit: Payer: Self-pay | Admitting: Family Medicine

## 2011-05-22 MED ORDER — WARFARIN SODIUM 3 MG PO TABS: 3.0000 mg | ORAL_TABLET | Freq: Every day | ORAL | Status: DC

## 2011-05-24 ENCOUNTER — Encounter: Payer: Self-pay | Admitting: Family Medicine

## 2011-05-27 ENCOUNTER — Ambulatory Visit (INDEPENDENT_AMBULATORY_CARE_PROVIDER_SITE_OTHER): Payer: Medicare Other | Admitting: Family Medicine

## 2011-05-27 VITALS — BP 106/55 | HR 45

## 2011-05-27 DIAGNOSIS — I4891 Unspecified atrial fibrillation: Secondary | ICD-10-CM

## 2011-05-27 LAB — POCT INR: INR: 3.4

## 2011-05-27 NOTE — Progress Notes (Signed)
Patient ID: [name], male   DOB: 1932-04-24, 77 y.o.   MRN: [id number] Pt here for INR

## 2011-06-05 ENCOUNTER — Ambulatory Visit (INDEPENDENT_AMBULATORY_CARE_PROVIDER_SITE_OTHER): Payer: Medicare Other | Admitting: Family Medicine

## 2011-06-05 VITALS — BP 110/60 | HR 55

## 2011-06-05 DIAGNOSIS — I4891 Unspecified atrial fibrillation: Secondary | ICD-10-CM

## 2011-06-05 LAB — POCT INR: INR: 2.2

## 2011-06-05 NOTE — Progress Notes (Signed)
Patient ID: [name], male   DOB: 1931/12/19, 77 y.o.   MRN: [id number] Here for INR for coumadin therapy

## 2011-06-18 ENCOUNTER — Ambulatory Visit (HOSPITAL_COMMUNITY)
Admission: RE | Admit: 2011-06-18 | Discharge: 2011-06-18 | Disposition: A | Discharge status: Home / Self Care | Payer: Medicare Other | Source: Ambulatory Visit | Attending: Orthopedic Surgery | Admitting: Orthopedic Surgery

## 2011-06-18 DIAGNOSIS — R52 Pain, unspecified: Secondary | ICD-10-CM

## 2011-06-18 DIAGNOSIS — M79609 Pain in unspecified limb: Secondary | ICD-10-CM

## 2011-06-18 DIAGNOSIS — E119 Type 2 diabetes mellitus without complications: Secondary | ICD-10-CM

## 2011-06-18 NOTE — Progress Notes (Signed)
VASCULAR LAB PRELIMINARY  PRELIMINARY  PRELIMINARY  PRELIMINARY  ARTERIAL  ABI completed:    RIGHT    LEFT    PRESSURE WAVEFORM  PRESSURE WAVEFORM  BRACHIAL 152 tri BRACHIAL 132 Tri  DP  absent DP  absent  PT 113 mono PT 150 mono  AT  absent AT  absent  PER 135 mono PER 154 mono  GREAT TOE 60  GREAT TOE 60     RIGHT LEFT  ABI 0.89 1    Right ABI indicates mild decrease in flow.  Left ABI appears normal.  Pressures may be falsely elevated.  Bilateral great toe pressures indicate adequate perfusion.  Left great toe waveform is diminished compared to the right.  Terance Hart, RVT 06/18/2011, 11:37 AM

## 2011-06-20 ENCOUNTER — Ambulatory Visit (INDEPENDENT_AMBULATORY_CARE_PROVIDER_SITE_OTHER): Payer: Medicare Other | Admitting: Family Medicine

## 2011-06-20 VITALS — BP 144/76 | HR 52

## 2011-06-20 DIAGNOSIS — I4891 Unspecified atrial fibrillation: Secondary | ICD-10-CM

## 2011-06-20 DIAGNOSIS — I2699 Other pulmonary embolism without acute cor pulmonale: Secondary | ICD-10-CM

## 2011-06-20 LAB — POCT INR: INR: 3.9

## 2011-06-20 NOTE — Progress Notes (Signed)
  Subjective:    Patient ID: [name], male    DOB: 09/04/1931, 79 y.o.   MRN: [id number] INR check HPI    Review of Systems     Objective:   Physical Exam        Assessment & Plan:   

## 2011-06-27 ENCOUNTER — Ambulatory Visit (INDEPENDENT_AMBULATORY_CARE_PROVIDER_SITE_OTHER): Payer: Medicare Other | Admitting: Family Medicine

## 2011-06-27 VITALS — BP 148/75 | HR 54

## 2011-06-27 DIAGNOSIS — I4891 Unspecified atrial fibrillation: Secondary | ICD-10-CM

## 2011-06-27 DIAGNOSIS — I2699 Other pulmonary embolism without acute cor pulmonale: Secondary | ICD-10-CM

## 2011-06-27 NOTE — Progress Notes (Signed)
  Subjective:    Patient ID: [name], male    DOB: 01-30-1932, 78 y.o.   MRN: [id number] PT/INR HPI    Review of Systems     Objective:   Physical Exam        Assessment & Plan:

## 2011-07-04 ENCOUNTER — Ambulatory Visit (INDEPENDENT_AMBULATORY_CARE_PROVIDER_SITE_OTHER): Payer: Medicare Other | Admitting: Family Medicine

## 2011-07-04 VITALS — BP 136/69 | HR 64

## 2011-07-04 DIAGNOSIS — I4891 Unspecified atrial fibrillation: Secondary | ICD-10-CM

## 2011-07-04 NOTE — Progress Notes (Signed)
  Subjective:    Patient ID: [name], male    DOB: 02/23/1932, 79 y.o.   MRN: [id number] INR check HPI    Review of Systems     Objective:   Physical Exam        Assessment & Plan:   

## 2011-07-11 ENCOUNTER — Ambulatory Visit (INDEPENDENT_AMBULATORY_CARE_PROVIDER_SITE_OTHER): Payer: Medicare Other | Admitting: Family Medicine

## 2011-07-11 VITALS — BP 154/77 | HR 88

## 2011-07-11 DIAGNOSIS — I4891 Unspecified atrial fibrillation: Secondary | ICD-10-CM

## 2011-07-11 NOTE — Progress Notes (Signed)
  Subjective:    Patient ID: [name], male    DOB: 04/22/1932, 79 y.o.   MRN: [id number] INR check HPI    Review of Systems     Objective:   Physical Exam        Assessment & Plan:   

## 2011-07-12 ENCOUNTER — Other Ambulatory Visit: Payer: Self-pay | Admitting: Family Medicine

## 2011-07-26 ENCOUNTER — Ambulatory Visit (INDEPENDENT_AMBULATORY_CARE_PROVIDER_SITE_OTHER): Payer: Medicare Other | Admitting: Physician Assistant

## 2011-07-26 VITALS — BP 112/66 | HR 109

## 2011-07-26 DIAGNOSIS — I4891 Unspecified atrial fibrillation: Secondary | ICD-10-CM

## 2011-07-26 NOTE — Patient Instructions (Signed)
Decrease dose. Recheck in 2 weeks.

## 2011-07-26 NOTE — Progress Notes (Signed)
  Subjective:    Patient ID: [name], male    DOB: 1932-03-21, 78 y.o.   MRN: [id number]  HPI  Here for INR  Review of Systems     Objective:   Physical Exam        Assessment & Plan:

## 2011-08-05 ENCOUNTER — Other Ambulatory Visit: Payer: Self-pay | Admitting: Cardiology

## 2011-08-05 DIAGNOSIS — I714 Abdominal aortic aneurysm, without rupture: Secondary | ICD-10-CM

## 2011-08-09 ENCOUNTER — Ambulatory Visit (INDEPENDENT_AMBULATORY_CARE_PROVIDER_SITE_OTHER): Payer: Medicare Other | Admitting: Family Medicine

## 2011-08-09 VITALS — BP 118/57 | HR 56

## 2011-08-09 DIAGNOSIS — I4891 Unspecified atrial fibrillation: Secondary | ICD-10-CM

## 2011-08-09 NOTE — Progress Notes (Signed)
Pt informed

## 2011-08-09 NOTE — Progress Notes (Signed)
  Subjective:    Patient ID: [name], male    DOB: 03/02/1932, 79 y.o.   MRN: [id number] INR check HPI    Review of Systems     Objective:   Physical Exam        Assessment & Plan:   

## 2011-08-10 ENCOUNTER — Other Ambulatory Visit: Payer: Self-pay | Admitting: Family Medicine

## 2011-08-12 ENCOUNTER — Encounter (INDEPENDENT_AMBULATORY_CARE_PROVIDER_SITE_OTHER): Payer: Medicare Other

## 2011-08-12 DIAGNOSIS — I714 Abdominal aortic aneurysm, without rupture, unspecified: Secondary | ICD-10-CM

## 2011-08-20 ENCOUNTER — Other Ambulatory Visit: Payer: Self-pay | Admitting: Family Medicine

## 2011-08-26 ENCOUNTER — Ambulatory Visit (INDEPENDENT_AMBULATORY_CARE_PROVIDER_SITE_OTHER): Payer: Medicare Other | Admitting: Family Medicine

## 2011-08-26 VITALS — BP 125/75 | HR 52

## 2011-08-26 DIAGNOSIS — I4891 Unspecified atrial fibrillation: Secondary | ICD-10-CM

## 2011-08-26 NOTE — Progress Notes (Signed)
  Subjective:    Patient ID: [name], male    DOB: 08/24/1931, 79 y.o.   MRN: [id number] INR check HPI    Review of Systems     Objective:   Physical Exam        Assessment & Plan:   

## 2011-09-01 ENCOUNTER — Other Ambulatory Visit: Payer: Self-pay | Admitting: Family Medicine

## 2011-09-16 ENCOUNTER — Ambulatory Visit: Payer: Medicare Other

## 2011-09-16 ENCOUNTER — Ambulatory Visit: Payer: Self-pay | Admitting: Family Medicine

## 2011-09-16 ENCOUNTER — Encounter: Payer: Self-pay | Admitting: Family Medicine

## 2011-09-16 ENCOUNTER — Ambulatory Visit (INDEPENDENT_AMBULATORY_CARE_PROVIDER_SITE_OTHER): Payer: Medicare Other | Admitting: Family Medicine

## 2011-09-16 DIAGNOSIS — G629 Polyneuropathy, unspecified: Secondary | ICD-10-CM

## 2011-09-16 DIAGNOSIS — I2699 Other pulmonary embolism without acute cor pulmonale: Secondary | ICD-10-CM

## 2011-09-16 DIAGNOSIS — I4891 Unspecified atrial fibrillation: Secondary | ICD-10-CM

## 2011-09-16 DIAGNOSIS — Z1331 Encounter for screening for depression: Secondary | ICD-10-CM

## 2011-09-16 DIAGNOSIS — E119 Type 2 diabetes mellitus without complications: Secondary | ICD-10-CM

## 2011-09-16 DIAGNOSIS — Z9181 History of falling: Secondary | ICD-10-CM

## 2011-09-16 DIAGNOSIS — L600 Ingrowing nail: Secondary | ICD-10-CM

## 2011-09-16 DIAGNOSIS — F0390 Unspecified dementia without behavioral disturbance: Secondary | ICD-10-CM

## 2011-09-16 LAB — FERRITIN: Ferritin: 133 ng/mL (ref 22–322)

## 2011-09-16 LAB — POCT INR: INR: 3.5

## 2011-09-16 LAB — FOLATE: Folate: >20 ng/mL

## 2011-09-16 NOTE — Progress Notes (Addendum)
  Subjective:    Patient ID: [name], male    DOB: Nov 04, 1931, 78 y.o.   MRN: [id number]  Diabetes He presents for his follow-up diabetic visit. He has type 2 diabetes mellitus. His disease course has been stable. There are no hypoglycemic associated symptoms. Associated symptoms include foot ulcerations. Pertinent negatives for diabetes include no chest pain, no polydipsia, no polyphagia and no polyuria. There are no hypoglycemic complications. Symptoms are stable. There are no diabetic complications. He is compliant with treatment all of the time. His weight is stable. He is following a generally healthy diet. When asked about meal planning, he reported none. An ACE inhibitor/angiotensin II receptor blocker is being taken.   He also reports that his right great toe was bleeding on and off about 2 weeks. He says at this point in time has not let him a story. It was tender and painful and his wife says she will, toenail and it seemed to get better on its own.   Review of Systems  Cardiovascular: Negative for chest pain.  Genitourinary: Negative for polyuria.  Hematological: Negative for polydipsia and polyphagia.       Objective:   Physical Exam  Constitutional: He is oriented to person, place, and time. He appears well-developed and well-nourished.  HENT:  Head: Normocephalic and atraumatic.  Cardiovascular: Normal rate, regular rhythm and normal heart sounds.   Pulmonary/Chest: Effort normal and breath sounds normal.  Musculoskeletal:       He did have some evidence of an ingrown toenail on the right great toe on the medial border. No significant erythema or edema at this time but I can tell it was swollen at one point in time the skin is peeling. There is no active bleeding or pus.  Neurological: He is alert and oriented to person, place, and time.  Skin: Skin is warm and dry.  Psychiatric: He has a normal mood and affect. His behavior is normal.          Assessment &  Plan:  DM- At goal. F/U in 4 months. Continue current regimen of metformin. Encouraged him to be a little bit more active as as tolerated. Blood pressure is well under control today. Foot Exam is up-to-date. Lab Results  Component Value Date   HGBA1C 6.9 09/16/2011   Inrown toenail on the right foot. I discussed to keep the nail cut straight across if not cut it into the side of the toe. Also if the pain recurs or swelling or drainage or blood that he needs to return for an office visit so that we can treat ingrown toenail.  Depression screen-PHQ none score of 15. Positive screening. He says he declines feeling depressed at all. He says over the wintertime he is definitely been that a lot more typing as a spring begins and the temperature warms up he will get outside and this will help his mood. He declines any further treatment or evaluation.  A fib- See coumadin flowsheet for adjustments. He is supratherapeutic.   Fall assessment-score of 12. This is moderate risk for falls. We'll give him a handout with information for fall prevention him home. Here uses a walker currently.  Peripheral neuropathy-also check his B12, folate, iron levels today. He certainly is at risk for B12 deficiency because he's been on metformin for quite some time.

## 2011-09-16 NOTE — Patient Instructions (Signed)
Home Safety and Preventing Falls Falls are a leading cause of injury and while they affect all age groups, falls have greater short-term and long-term impact on older age groups. However, falls should not be a part of life or aging. It is possible for individuals and their families to use preventive measures to significantly decrease the likelihood that anyone, especially an older adult, will fall. There are many simple measures which can make your home safer with respect to preventing falls. The following actions can help reduce falls among all members of your family and are especially important as you age, when your balance, lower limb strength, coordination, and eyesight may be declining. The use of preventive measures will help to reduce you and your family's risk of falls and serious medical consequences. OUTDOORS  Repair cracks and edges of walkways and driveways.   Remove high doorway thresholds and trim shrubbery on the main path into your home.   Ensure there is good outside lighting at main entrances and along main walkways.   Clear walkways of tools, rocks, debris, and clutter.   Check that handrails are not broken and are securely fastened. Both sides of steps should have handrails.   In the garage, be attentive to and clean up grease or oil spills on the cement. This can make the surface extremely slippery.   In winter, have leaves, snow, and ice cleared regularly.   Use sand or salt on walkways during winter months.  BATHROOM  Install grab bars by the toilet and in the tub and shower.   Use non-skid mats or decals in the tub or shower.   If unable to easily stand unsupported while showering, place a plastic non slip stool in the shower to sit on when needed.   Install night lights.   Keep floors dry and clean up all water on the floor immediately.   Remove soap buildup in tub or shower on a regular basis.   Secure bath mats with non-slip, double-sided rug tape.    Remove tripping hazards from the floors.  BEDROOMS  Install night lights.   Do not use oversized bedding.   Make sure a bedside light is easy to reach.   Keep a telephone by your bedside.   Make sure that you can get in and out of your bed easily.   Have a firm chair, with side arms, to use for getting dressed.   Remove clutter from around closets.   Store clothing, bed coverings, and other household items where you can reach them comfortably.   Remove tripping hazards from the floor.  LIVING AREAS AND STAIRWAYS  Turn on lights to avoid having to walk through dark areas.   Keep lighting uniform in each room. Place brighter lightbulbs in darker areas, including stairways.   Replace lightbulbs that burn out in stairways immediately.   Arrange furniture to provide for clear pathways.   Keep furniture in the same place.   Eliminate or tape down electrical cables in high traffic areas.   Place handrails on both sides of stairways. Use handrails when going up or down stairs.   Most falls occur on the top or bottom 3 steps.   Fix any loose handrails. Make sure handrails on both sides of the stairways are as long as the stairs.   Remove all walkway obstacles.   Coil or tape electrical cords off to the side of walking areas and out of the way. If using many extension cords, have an electrician   put in a new wall outlet to reduce or eliminate them.   Make sure spills are cleaned up quickly and allow time for drying before walking on freshly cleaned floors.   Firmly attach carpet with non-skid or two-sided tape.   Keep frequently used items within easy reach.   Remove tripping hazards such as throw rugs and clutter in walkways. Never leave objects on stairs.   Get rid of throw rugs elsewhere if possible.   Eliminate uneven floor surfaces.   Make sure couches and chairs are easy to get into and out of.   Check carpeting to make sure it is firmly attached along stairs.    Make repairs to worn or loose carpet promptly.   Select a carpet pattern that does not visually hide the edge of steps.   Avoid placing throw rugs or scatter rugs at the top or bottom of stairways, or properly secure with carpet tape to prevent slippage.   Have an electrician put in a light switch at the top and bottom of the stairs.   Get light switches that glow.   Avoid the following practices: hurrying, inattention, obscured vision, carrying large loads, and wearing slip-on shoes.   Be aware of all pets.  KITCHEN  Place items that are used frequently, such as dishes and food, within easy reach.   Keep handles on pots and pans toward the center of the stove. Use back burners when possible.   Make sure spills are cleaned up quickly and allow time for drying.   Avoid walking on wet floors.   Avoid hot utensils and knives.   Position shelves so they are not too high or low.   Place commonly used objects within easy reach.   If necessary, use a sturdy step stool with a grab bar when reaching.   Make sure electrical cables are out of the way.   Do not use floor polish or wax that makes floors slippery.  OTHER HOME FALL PREVENTION STRATEGIES  Wear low heel or rubber sole shoes that are supportive and fit well.   Wear closed toe shoes.   Know and watch for side effects of medications. Have your caregiver or pharmacist look at all your medicines, even over-the-counter medicines. Some medicines can make you sleepy or dizzy.   Exercise regularly. Exercise makes you stronger and improves your balance and coordination.   Limit use of alcohol.   Use eyeglasses if necessary and keep them clean. Have your vision checked every year.   Organize your household in a manner that minimizes the need to walk distances when hurried, or go up and down stairs unnecessarily. For example, have a phone placed on at least each floor of your home. If possible, have a phone beside each sitting  or lying area where you spend the most time at home. Keep emergency numbers posted at all phones.   Use non-skid floor wax.   When using a ladder, make sure:   The base is firm.   All ladder feet are on level ground.   The ladder is angled against the wall properly.   When climbing a ladder, face the ladder and hold the ladder rungs firmly.   If reaching, always keep your hips and body weight centered between the rails.   When using a stepladder, make sure it is fully opened and both spreaders are firmly locked.   Do not climb a closed stepladder.   Avoid climbing beyond the second step from the top   of a stepladder and the 4th rung from the top of an extension ladder.   Learn and use mobility aids as needed.   Change positions slowly. Arise slowly from sitting and lying positions. Sit on the edge of your bed before getting to your feet.   If you have a history of falls, ask someone to add color or contrast paint or tape to grab bars and handrails in your home.   If you have a history of falls, ask someone to place contrasting color strips on first and last steps.   Install an electrical emergency response system if you need one, and know how to use it.   If you have a medical or other condition that causes you to have limited physical strength, it is important that you reach out to family and friends for occasional help.  FOR CHILDREN:  If young children are in the home, use safety gates. At the top of stairs use screw-mounted gates; use pressure-mounted gates for the bottom of the stairs and doorways between rooms.   Young children should be taught to descend stairs on their stomachs, feet first, and later using the handrail.   Keep drawers fully closed to prevent them from being climbed on or pulled out entirely.   Move chairs, cribs, beds and other furniture away from windows.   Consider installing window guards on windows ground floor and up, unless they are emergency  fire exits. Make sure they have easy release mechanisms.   Consider installing special locks that only allow the window to be opened to a certain height.   Never rely on window screens to prevent falls.   Never leave babies alone on changing tables, beds or sofas. Use a changing table that has a restraining strap.   When a child can pull to a standing position, the crib mattress should be adjusted to its lowest position. There should be at least 26 inches between the top rails of the crib drop side and the mattress. Toys, bumper pads, and other objects that can be used as steps to climb out should be removed from the crib.   On bunk beds never allow a child under age 6 to sleep on the top bunk. For older children, if the upper bunk is not against a wall, use guard rails on both sides. No matter how old a child is, keep the guard rails in place on the top bunk since children roll during sleep. Do not permit horseplay on bunks.   Grass and soil surfaces beneath backyard playground equipment should be replaced with hardwood chips, shredded wood mulch, sand, pea gravel, rubber, crushed stone, or another safer material at depths of at least 9 to 12 inches.   When riding bikes or using skates, skateboards, skis, or snowboards, require children to wear helmets. Look for those that have stickers stating that they meet or exceed safety standards.   Vertical posts or pickets in deck, balcony, and stairway railings should be no more than 3 1/2 inches apart if a young baby will have access to the area. The space between horizontal rails or bars, and between the floor and the first horizontal rail or bar, should be no more than 3 1/2 inches.  Document Released: 05/17/2002 Document Revised: 05/16/2011 Document Reviewed: 03/16/2009 [employer] Patient Information 2012 [employer]. 

## 2011-09-17 ENCOUNTER — Other Ambulatory Visit: Payer: Self-pay | Admitting: Family Medicine

## 2011-09-23 ENCOUNTER — Ambulatory Visit (INDEPENDENT_AMBULATORY_CARE_PROVIDER_SITE_OTHER): Payer: Medicare Other | Admitting: Family Medicine

## 2011-09-23 VITALS — BP 114/63 | HR 48

## 2011-09-23 DIAGNOSIS — I4891 Unspecified atrial fibrillation: Secondary | ICD-10-CM

## 2011-09-23 LAB — POCT INR: INR: 2.2

## 2011-09-23 NOTE — Progress Notes (Signed)
  Subjective:    Patient ID: [name], male    DOB: 02/15/1932, 79 y.o.   MRN: [id number] INR check HPI    Review of Systems     Objective:   Physical Exam        Assessment & Plan:   

## 2011-09-30 ENCOUNTER — Other Ambulatory Visit: Payer: Self-pay | Admitting: Family Medicine

## 2011-09-30 ENCOUNTER — Ambulatory Visit (INDEPENDENT_AMBULATORY_CARE_PROVIDER_SITE_OTHER): Payer: Medicare Other | Admitting: Family Medicine

## 2011-09-30 VITALS — BP 104/57 | HR 57

## 2011-09-30 DIAGNOSIS — I4891 Unspecified atrial fibrillation: Secondary | ICD-10-CM

## 2011-09-30 NOTE — Patient Instructions (Signed)
Decrease metoprolol to 1 tab twice a day.

## 2011-09-30 NOTE — Progress Notes (Signed)
  Subjective:    Patient ID: [name], male    DOB: 05/02/1932, 79 y.o.   MRN: [id number] INR check HPI    Review of Systems     Objective:   Physical Exam        Assessment & Plan:   

## 2011-10-11 ENCOUNTER — Other Ambulatory Visit: Payer: Self-pay | Admitting: Family Medicine

## 2011-10-14 ENCOUNTER — Ambulatory Visit (INDEPENDENT_AMBULATORY_CARE_PROVIDER_SITE_OTHER): Payer: Medicare Other | Admitting: Family Medicine

## 2011-10-14 VITALS — BP 114/59 | HR 65

## 2011-10-14 DIAGNOSIS — I4891 Unspecified atrial fibrillation: Secondary | ICD-10-CM

## 2011-10-14 MED ORDER — METFORMIN HCL 500 MG PO TABS: 500.0000 mg | ORAL_TABLET | Freq: Two times a day (BID) | ORAL | Status: DC

## 2011-10-14 NOTE — Progress Notes (Signed)
Addended by: Ellsworth Lennox on: 10/14/2011 02:22 PM   Modules accepted: Orders

## 2011-10-14 NOTE — Progress Notes (Signed)
Patient ID: [name], male   DOB: [birth year], 78 y.o.   MRN: [id number] INR for coumadin therapy

## 2011-10-16 ENCOUNTER — Other Ambulatory Visit: Payer: Self-pay | Admitting: Family Medicine

## 2011-10-23 ENCOUNTER — Inpatient Hospital Stay (HOSPITAL_COMMUNITY)
Admission: EM | Admit: 2011-10-23 | Discharge: 2011-11-05 | DRG: 698 | Disposition: A | Discharge status: Skilled Nursing Facility | Payer: Medicare Other | Source: Ambulatory Visit | Attending: Internal Medicine | Admitting: Internal Medicine

## 2011-10-23 ENCOUNTER — Telehealth: Payer: Self-pay | Admitting: *Deleted

## 2011-10-23 ENCOUNTER — Emergency Department (HOSPITAL_COMMUNITY): Payer: Medicare Other

## 2011-10-23 ENCOUNTER — Encounter (HOSPITAL_COMMUNITY): Payer: Self-pay

## 2011-10-23 DIAGNOSIS — L270 Generalized skin eruption due to drugs and medicaments taken internally: Secondary | ICD-10-CM | POA: Diagnosis not present

## 2011-10-23 DIAGNOSIS — N390 Urinary tract infection, site not specified: Secondary | ICD-10-CM | POA: Diagnosis present

## 2011-10-23 DIAGNOSIS — I25810 Atherosclerosis of coronary artery bypass graft(s) without angina pectoris: Secondary | ICD-10-CM | POA: Diagnosis present

## 2011-10-23 DIAGNOSIS — Z7901 Long term (current) use of anticoagulants: Secondary | ICD-10-CM

## 2011-10-23 DIAGNOSIS — Z8546 Personal history of malignant neoplasm of prostate: Secondary | ICD-10-CM

## 2011-10-23 DIAGNOSIS — R52 Pain, unspecified: Secondary | ICD-10-CM

## 2011-10-23 DIAGNOSIS — Z951 Presence of aortocoronary bypass graft: Secondary | ICD-10-CM

## 2011-10-23 DIAGNOSIS — I252 Old myocardial infarction: Secondary | ICD-10-CM

## 2011-10-23 DIAGNOSIS — Z87891 Personal history of nicotine dependence: Secondary | ICD-10-CM

## 2011-10-23 DIAGNOSIS — G8929 Other chronic pain: Secondary | ICD-10-CM | POA: Diagnosis present

## 2011-10-23 DIAGNOSIS — Z8249 Family history of ischemic heart disease and other diseases of the circulatory system: Secondary | ICD-10-CM

## 2011-10-23 DIAGNOSIS — L8992 Pressure ulcer of unspecified site, stage 2: Secondary | ICD-10-CM | POA: Diagnosis present

## 2011-10-23 DIAGNOSIS — E119 Type 2 diabetes mellitus without complications: Secondary | ICD-10-CM | POA: Diagnosis present

## 2011-10-23 DIAGNOSIS — L89109 Pressure ulcer of unspecified part of back, unspecified stage: Secondary | ICD-10-CM | POA: Diagnosis present

## 2011-10-23 DIAGNOSIS — T368X5A Adverse effect of other systemic antibiotics, initial encounter: Secondary | ICD-10-CM | POA: Diagnosis not present

## 2011-10-23 DIAGNOSIS — Z833 Family history of diabetes mellitus: Secondary | ICD-10-CM

## 2011-10-23 DIAGNOSIS — Z923 Personal history of irradiation: Secondary | ICD-10-CM

## 2011-10-23 DIAGNOSIS — F0390 Unspecified dementia without behavioral disturbance: Secondary | ICD-10-CM

## 2011-10-23 DIAGNOSIS — G609 Hereditary and idiopathic neuropathy, unspecified: Secondary | ICD-10-CM

## 2011-10-23 DIAGNOSIS — Y831 Surgical operation with implant of artificial internal device as the cause of abnormal reaction of the patient, or of later complication, without mention of misadventure at the time of the procedure: Secondary | ICD-10-CM | POA: Diagnosis present

## 2011-10-23 DIAGNOSIS — M1990 Unspecified osteoarthritis, unspecified site: Secondary | ICD-10-CM

## 2011-10-23 DIAGNOSIS — Z794 Long term (current) use of insulin: Secondary | ICD-10-CM

## 2011-10-23 DIAGNOSIS — IMO0002 Reserved for concepts with insufficient information to code with codable children: Secondary | ICD-10-CM

## 2011-10-23 DIAGNOSIS — Z880 Allergy status to penicillin: Secondary | ICD-10-CM

## 2011-10-23 DIAGNOSIS — N289 Disorder of kidney and ureter, unspecified: Secondary | ICD-10-CM

## 2011-10-23 DIAGNOSIS — I4891 Unspecified atrial fibrillation: Secondary | ICD-10-CM | POA: Diagnosis present

## 2011-10-23 DIAGNOSIS — Y92009 Unspecified place in unspecified non-institutional (private) residence as the place of occurrence of the external cause: Secondary | ICD-10-CM

## 2011-10-23 DIAGNOSIS — Y846 Urinary catheterization as the cause of abnormal reaction of the patient, or of later complication, without mention of misadventure at the time of the procedure: Secondary | ICD-10-CM | POA: Diagnosis present

## 2011-10-23 DIAGNOSIS — I10 Essential (primary) hypertension: Secondary | ICD-10-CM | POA: Diagnosis present

## 2011-10-23 DIAGNOSIS — Z96649 Presence of unspecified artificial hip joint: Secondary | ICD-10-CM

## 2011-10-23 DIAGNOSIS — T84049A Periprosthetic fracture around unspecified internal prosthetic joint, initial encounter: Secondary | ICD-10-CM | POA: Diagnosis present

## 2011-10-23 DIAGNOSIS — I5031 Acute diastolic (congestive) heart failure: Secondary | ICD-10-CM | POA: Diagnosis present

## 2011-10-23 DIAGNOSIS — Z79899 Other long term (current) drug therapy: Secondary | ICD-10-CM

## 2011-10-23 DIAGNOSIS — I509 Heart failure, unspecified: Secondary | ICD-10-CM | POA: Diagnosis present

## 2011-10-23 DIAGNOSIS — E785 Hyperlipidemia, unspecified: Secondary | ICD-10-CM

## 2011-10-23 DIAGNOSIS — R5383 Other fatigue: Secondary | ICD-10-CM

## 2011-10-23 DIAGNOSIS — R509 Fever, unspecified: Secondary | ICD-10-CM

## 2011-10-23 DIAGNOSIS — T83511A Infection and inflammatory reaction due to indwelling urethral catheter, initial encounter: Principal | ICD-10-CM | POA: Diagnosis present

## 2011-10-23 DIAGNOSIS — I714 Abdominal aortic aneurysm, without rupture: Secondary | ICD-10-CM

## 2011-10-23 DIAGNOSIS — I2510 Atherosclerotic heart disease of native coronary artery without angina pectoris: Secondary | ICD-10-CM | POA: Diagnosis present

## 2011-10-23 HISTORY — DX: Unspecified osteoarthritis, unspecified site: M19.90

## 2011-10-23 HISTORY — DX: Peripheral vascular disease, unspecified: I73.9

## 2011-10-23 HISTORY — DX: Malignant (primary) neoplasm, unspecified: C80.1

## 2011-10-23 LAB — DIFFERENTIAL
Basophils Absolute: 0 10*3/uL (ref 0.0–0.1)
Basophils Relative: 0 % (ref 0–1)
Eosinophils Relative: 0 % (ref 0–5)
Lymphocytes Relative: 17 % (ref 12–46)
Monocytes Absolute: 1.3 10*3/uL — ABNORMAL HIGH (ref 0.1–1.0)

## 2011-10-23 LAB — URINALYSIS, ROUTINE W REFLEX MICROSCOPIC
Glucose, UA: NEGATIVE mg/dL
Ketones, ur: NEGATIVE mg/dL
Protein, ur: NEGATIVE mg/dL

## 2011-10-23 LAB — LACTIC ACID, PLASMA: Lactic Acid, Venous: 1.9 mmol/L (ref 0.5–2.2)

## 2011-10-23 LAB — URINE MICROSCOPIC-ADD ON

## 2011-10-23 LAB — BASIC METABOLIC PANEL
CO2: 24 mEq/L (ref 19–32)
Calcium: 9 mg/dL (ref 8.4–10.5)
Creatinine, Ser: 1.03 mg/dL (ref 0.50–1.35)

## 2011-10-23 LAB — GRAM STAIN

## 2011-10-23 LAB — CBC
HCT: 37.8 % — ABNORMAL LOW (ref 39.0–52.0)
MCH: 32.4 pg (ref 26.0–34.0)
MCHC: 33.3 g/dL (ref 30.0–36.0)
MCV: 97.2 fL (ref 78.0–100.0)
RDW: 14.1 % (ref 11.5–15.5)

## 2011-10-23 MED ORDER — ACETAMINOPHEN 325 MG PO TABS
650.0000 mg | ORAL_TABLET | Freq: Once | ORAL | Status: AC
  Administered 2011-10-23: 650 mg via ORAL
  Filled 2011-10-23: qty 2

## 2011-10-23 MED ORDER — SODIUM CHLORIDE 0.9 % IV BOLUS (SEPSIS)
1000.0000 mL | Freq: Once | INTRAVENOUS | Status: AC
  Administered 2011-10-23: 1000 mL via INTRAVENOUS

## 2011-10-23 MED ORDER — DEXTROSE 5 % IV SOLN
1.0000 g | Freq: Once | INTRAVENOUS | Status: AC
  Administered 2011-10-23: 1 g via INTRAVENOUS
  Filled 2011-10-23: qty 10

## 2011-10-23 MED ORDER — SODIUM CHLORIDE 0.9 % IV BOLUS (SEPSIS)
500.0000 mL | Freq: Once | INTRAVENOUS | Status: AC
  Administered 2011-10-23: 500 mL via INTRAVENOUS

## 2011-10-23 NOTE — ED Notes (Signed)
Received report pt. Alert and oriented, NAD noted

## 2011-10-23 NOTE — ED Notes (Signed)
Patient has a suprapubic catheter in place from results of chemotherapy.  Patient has had catheter since 2006.  Last changed 2 weeks ago.

## 2011-10-23 NOTE — ED Notes (Signed)
Family at bedside.Wife.

## 2011-10-23 NOTE — Telephone Encounter (Signed)
Wife called and states pt is feeling very weak and tired and has a fever of 103. Wife has been sick with vomiting and diarrhea. Pt has not vomited or had diarrhea and denies any pain. His main complaint is that he is tired and has the fever which started today. Wife states is has not eaten and does not want to get up. Advised that he does need to be seen since his fever is that high and advised to go to UC. Wife voiced understanding.

## 2011-10-23 NOTE — ED Notes (Signed)
S/p tube changed awaiting urine spec

## 2011-10-23 NOTE — ED Provider Notes (Signed)
History     CSN: [id number]  Arrival date & time 10/23/11  1811   First MD Initiated Contact with Patient 10/23/11 1840      Chief Complaint  Patient presents with  . Fatigue    (Consider location/radiation/quality/duration/timing/severity/associated sxs/prior treatment) HPI Comments: Fever and generalized fatigue today.  No cough, dysuria.  Otherwise feeling well.  Patient is a 78 y.o. male presenting with fever. The history is provided by the patient.  Fever Primary symptoms of the febrile illness include fever and fatigue. Primary symptoms do not include headaches, cough, wheezing, shortness of breath, nausea, vomiting, diarrhea, dysuria, altered mental status, arthralgias or rash. The current episode started today. This is a new problem. The problem has not changed since onset. Risk factors: indwelling foley.   Past Medical History  Diagnosis Date  . A-fib   . CAD (coronary artery disease)   . Hyperlipidemia   . Diabetes mellitus   . Hypertension   . Suprapubic catheter     Past Surgical History  Procedure Date  . Prostatectomy     radiation therapy  . Bladder cath   . Laminectomy     lumbar  . Lumbar disc surgery   . Right colectomy   . Incisional hernia repair   . Oronary artery bypass   . Total hip arthroplasty     RT  . Cataract extraction 2012    Left 03/2011, Right 04/2011    Family History  Problem Relation Age of Onset  . Coronary artery disease      siblings  . Heart attack Father   . Diabetes Sister   . Hyperlipidemia      family history    History  Substance Use Topics  . Smoking status: Former Smoker    Types: Cigarettes    Quit date: 06/10/1970  . Smokeless tobacco: Not on file  . Alcohol Use: No      Review of Systems  Constitutional: Positive for fever and fatigue. Negative for activity change.  HENT: Negative for congestion.   Eyes: Negative for pain.  Respiratory: Negative for cough, chest tightness, shortness of breath,  wheezing and stridor.   Cardiovascular: Negative for chest pain and leg swelling.  Gastrointestinal: Negative for nausea, vomiting and diarrhea.  Genitourinary: Negative for dysuria.  Musculoskeletal: Negative for arthralgias.  Skin: Negative for rash.  Neurological: Negative for headaches.  Psychiatric/Behavioral: Negative for behavioral problems and altered mental status.    Allergies  Codeine and Penicillins  Home Medications   Current Outpatient Rx  Name Route Sig Dispense Refill  . ACCU-CHEK AVIVA PLUS VI STRP  TEST UP TO TWICE DAILY 100 each 3  . ATORVASTATIN CALCIUM 80 MG PO TABS Oral Take 80 mg by mouth daily.    Marland Kitchen BLOOD GLUCOSE MONITORING SUPPL MISC       . DIGOXIN 0.125 MG PO TABS Oral Take 125 mcg by mouth daily.    . OMEGA-3 FATTY ACIDS 1000 MG PO CAPS Oral Take 1 g by mouth daily.      Marland Kitchen METFORMIN HCL 500 MG PO TABS Oral Take 1 tablet (500 mg total) by mouth 2 (two) times daily with a meal. 60 tablet 3  . METOPROLOL SUCCINATE ER 50 MG PO TB24 Oral Take 50 mg by mouth daily. Take with or immediately following a meal.    . OMEPRAZOLE MAGNESIUM 20 MG PO TBEC Oral Take 20 mg by mouth daily.      Marland Kitchen RAMIPRIL 10 MG PO TABS Oral Take  10 mg by mouth daily.    . WARFARIN SODIUM 3 MG PO TABS Oral Take 1 tablet (3 mg total) by mouth daily. 40 tablet 5  . AMBULATORY NON FORMULARY MEDICATION  Medication Name: Shingles vaccine IM x 1 1 vial 0    BP 140/67  Pulse 72  Temp(Src) 102.4 F (39.1 C) (Oral)  Resp 19  SpO2 100%  Physical Exam  Constitutional: He is oriented to person, place, and time. He appears well-developed and well-nourished. No distress.  HENT:  Head: Normocephalic and atraumatic.  Eyes: Conjunctivae and EOM are normal. Pupils are equal, round, and reactive to light. No scleral icterus.  Neck: Normal range of motion. Neck supple.  Cardiovascular: Normal rate and regular rhythm.  Exam reveals no gallop and no friction rub.   No murmur heard. Pulmonary/Chest:  Effort normal and breath sounds normal. No respiratory distress. He has no wheezes. He has no rales. He exhibits no tenderness.  Abdominal: Soft. He exhibits no distension and no mass. There is no tenderness. There is no rebound and no guarding.       Indwelling foley - suprapubic.  Musculoskeletal: Normal range of motion. He exhibits no edema and no tenderness.  Neurological: He is alert and oriented to person, place, and time. He has normal reflexes. No cranial nerve deficit. He exhibits normal muscle tone. Coordination normal.  Skin: Skin is warm and dry. No rash noted. He is not diaphoretic. No erythema.  Psychiatric: He has a normal mood and affect. His behavior is normal. Judgment and thought content normal.    ED Course  Procedures (including critical care time)  Labs Reviewed  CBC - Abnormal; Notable for the following:    RBC 3.89 (*)    Hemoglobin 12.6 (*)    HCT 37.8 (*)    All other components within normal limits  DIFFERENTIAL - Abnormal; Notable for the following:    Monocytes Relative 14 (*)    Monocytes Absolute 1.3 (*)    All other components within normal limits  BASIC METABOLIC PANEL - Abnormal; Notable for the following:    Glucose, Bld 159 (*)    GFR calc non Af Amer 68 (*)    GFR calc Af Amer 79 (*)    All other components within normal limits  URINALYSIS, ROUTINE W REFLEX MICROSCOPIC - Abnormal; Notable for the following:    APPearance CLOUDY (*)    Hgb urine dipstick MODERATE (*)    Leukocytes, UA LARGE (*)    All other components within normal limits  PROTIME-INR - Abnormal; Notable for the following:    Prothrombin Time 20.3 (*)    INR 1.70 (*)    All other components within normal limits  URINE MICROSCOPIC-ADD ON - Abnormal; Notable for the following:    Bacteria, UA FEW (*)    All other components within normal limits  GRAM STAIN  LACTIC ACID, PLASMA  URINE CULTURE  CULTURE, BLOOD (ROUTINE X 2)  CULTURE, BLOOD (ROUTINE X 2)   Dg Chest 2  View  10/23/2011  *RADIOLOGY REPORT*  Clinical Data: Fatigue.  Fever.  Altered level of consciousness.  CHEST - 2 VIEW  Comparison: 05/09/2009  Findings: Prior CABG noted.  Mild cardiomegaly is present with indistinct pulmonary vasculature and cephalization of blood flow suggesting pulmonary venous hypertension.  Overt edema is not observed.  Linear subsegmental atelectasis noted at the left lung base.  Atherosclerotic calcification of the aortic arch is noted.  No pleural effusion is identified.  IMPRESSION:  1.  Cardiomegaly with pulmonary venous hypertension.  No overt edema. 2.  Atherosclerosis.  Original Report Authenticated By: Dellia Cloud, M.D.     1. Fever   2. Fatigue   3. UTI (urinary tract infection)       MDM  Fever and generalized fatigue today.  No cough, dysuria.  Otherwise feeling well.  VSS but has high grade fever.  No clear source on exam but have suspicion for UTI given indwelling catheter.  Labs show UTI and otherwise unconcerning.  Hydrated in ED but pt with persistent generalized weakness.  Does not feel like he can walk 2/2 fatigue.  Treated with rocephin and will discuss with hospitalist for admission.        Army Chaco, MD 10/23/11 [phone]

## 2011-10-23 NOTE — ED Notes (Signed)
Pt c/o weakness, fever of 103.3 at 3pm.  Daughter reports they were at a graduation friday and the family went out to eat and 9 out 10 family members started having s/s of nausea and vomiting, concerned this may be a contributor to current s/s.   Denies abdominal pain, nausea, and diarrhea.

## 2011-10-24 ENCOUNTER — Inpatient Hospital Stay (HOSPITAL_COMMUNITY): Payer: Medicare Other

## 2011-10-24 ENCOUNTER — Encounter (HOSPITAL_COMMUNITY): Payer: Self-pay | Admitting: Internal Medicine

## 2011-10-24 DIAGNOSIS — I4891 Unspecified atrial fibrillation: Secondary | ICD-10-CM

## 2011-10-24 DIAGNOSIS — I2510 Atherosclerotic heart disease of native coronary artery without angina pectoris: Secondary | ICD-10-CM

## 2011-10-24 DIAGNOSIS — R509 Fever, unspecified: Secondary | ICD-10-CM

## 2011-10-24 DIAGNOSIS — E782 Mixed hyperlipidemia: Secondary | ICD-10-CM

## 2011-10-24 DIAGNOSIS — N390 Urinary tract infection, site not specified: Secondary | ICD-10-CM | POA: Diagnosis present

## 2011-10-24 LAB — PROCALCITONIN: Procalcitonin: 0.64 ng/mL

## 2011-10-24 LAB — URINE CULTURE: Colony Count: >=100000

## 2011-10-24 LAB — CBC
MCH: 31.8 pg (ref 26.0–34.0)
MCV: 95.7 fL (ref 78.0–100.0)
Platelets: 161 10*3/uL (ref 150–400)
RDW: 14.1 % (ref 11.5–15.5)

## 2011-10-24 LAB — COMPREHENSIVE METABOLIC PANEL
ALT: 13 U/L (ref 0–53)
AST: 18 U/L (ref 0–37)
Albumin: 2.8 g/dL — ABNORMAL LOW (ref 3.5–5.2)
Alkaline Phosphatase: 63 U/L (ref 39–117)
Chloride: 102 mEq/L (ref 96–112)
Potassium: 3.4 mEq/L — ABNORMAL LOW (ref 3.5–5.1)
Sodium: 136 mEq/L (ref 135–145)
Total Bilirubin: 0.7 mg/dL (ref 0.3–1.2)

## 2011-10-24 LAB — GLUCOSE, CAPILLARY: Glucose-Capillary: 137 mg/dL — ABNORMAL HIGH (ref 70–99)

## 2011-10-24 LAB — LACTIC ACID, PLASMA: Lactic Acid, Venous: 2.1 mmol/L (ref 0.5–2.2)

## 2011-10-24 MED ORDER — VANCOMYCIN HCL 1000 MG IV SOLR
750.0000 mg | Freq: Two times a day (BID) | INTRAVENOUS | Status: DC
  Administered 2011-10-24 – 2011-10-27 (×7): 750 mg via INTRAVENOUS
  Filled 2011-10-24 (×9): qty 750

## 2011-10-24 MED ORDER — AMITRIPTYLINE HCL 50 MG PO TABS
50.0000 mg | ORAL_TABLET | Freq: Every day | ORAL | Status: DC
  Administered 2011-10-24 – 2011-10-31 (×8): 50 mg via ORAL
  Filled 2011-10-24 (×10): qty 1

## 2011-10-24 MED ORDER — ATORVASTATIN CALCIUM 80 MG PO TABS
80.0000 mg | ORAL_TABLET | Freq: Every day | ORAL | Status: DC
  Administered 2011-10-24 – 2011-11-03 (×11): 80 mg via ORAL
  Filled 2011-10-24 (×11): qty 1

## 2011-10-24 MED ORDER — GLUCERNA SHAKE PO LIQD
237.0000 mL | Freq: Three times a day (TID) | ORAL | Status: DC
  Administered 2011-10-24 – 2011-11-04 (×25): 237 mL via ORAL
  Filled 2011-10-24 (×9): qty 237

## 2011-10-24 MED ORDER — DIGOXIN 125 MCG PO TABS
125.0000 ug | ORAL_TABLET | Freq: Every day | ORAL | Status: DC
  Administered 2011-10-24 – 2011-11-05 (×13): 125 ug via ORAL
  Filled 2011-10-24 (×13): qty 1

## 2011-10-24 MED ORDER — ACETAMINOPHEN 325 MG PO TABS
650.0000 mg | ORAL_TABLET | Freq: Four times a day (QID) | ORAL | Status: DC | PRN
  Administered 2011-10-24 – 2011-10-30 (×8): 650 mg via ORAL
  Filled 2011-10-24 (×8): qty 2
  Filled 2011-10-24: qty 1

## 2011-10-24 MED ORDER — VANCOMYCIN HCL 1000 MG IV SOLR
750.0000 mg | Freq: Once | INTRAVENOUS | Status: AC
  Administered 2011-10-24: 750 mg via INTRAVENOUS
  Filled 2011-10-24: qty 750

## 2011-10-24 MED ORDER — SODIUM CHLORIDE 0.9 % IJ SOLN
3.0000 mL | Freq: Two times a day (BID) | INTRAMUSCULAR | Status: DC
  Administered 2011-10-24 – 2011-11-04 (×9): 3 mL via INTRAVENOUS

## 2011-10-24 MED ORDER — WARFARIN - PHARMACIST DOSING INPATIENT
Freq: Every day | Status: DC
  Administered 2011-10-25: 18:00:00

## 2011-10-24 MED ORDER — RAMIPRIL 10 MG PO CAPS
10.0000 mg | ORAL_CAPSULE | Freq: Every day | ORAL | Status: DC
  Administered 2011-10-24 – 2011-11-03 (×11): 10 mg via ORAL
  Filled 2011-10-24 (×11): qty 1

## 2011-10-24 MED ORDER — OMEGA-3-ACID ETHYL ESTERS 1 G PO CAPS
1.0000 g | ORAL_CAPSULE | Freq: Every day | ORAL | Status: DC
  Administered 2011-10-24 – 2011-11-01 (×9): 1 g via ORAL
  Filled 2011-10-24 (×11): qty 1

## 2011-10-24 MED ORDER — PIPERACILLIN-TAZOBACTAM 3.375 G IVPB 30 MIN
3.3750 g | Freq: Once | INTRAVENOUS | Status: AC
  Administered 2011-10-24: 3.375 g via INTRAVENOUS
  Filled 2011-10-24: qty 50

## 2011-10-24 MED ORDER — PIPERACILLIN-TAZOBACTAM 3.375 G IVPB
3.3750 g | Freq: Three times a day (TID) | INTRAVENOUS | Status: DC
  Administered 2011-10-24 – 2011-10-29 (×16): 3.375 g via INTRAVENOUS
  Filled 2011-10-24 (×22): qty 50

## 2011-10-24 MED ORDER — METOPROLOL SUCCINATE ER 100 MG PO TB24
100.0000 mg | ORAL_TABLET | Freq: Two times a day (BID) | ORAL | Status: DC
  Administered 2011-10-24 – 2011-10-25 (×3): 100 mg via ORAL
  Filled 2011-10-24 (×5): qty 1

## 2011-10-24 MED ORDER — OMEPRAZOLE 20 MG PO CPDR
20.0000 mg | DELAYED_RELEASE_CAPSULE | Freq: Every day | ORAL | Status: DC
  Administered 2011-10-24 – 2011-10-31 (×8): 20 mg via ORAL
  Filled 2011-10-24 (×8): qty 1

## 2011-10-24 MED ORDER — GABAPENTIN 300 MG PO CAPS
300.0000 mg | ORAL_CAPSULE | Freq: Three times a day (TID) | ORAL | Status: DC
  Administered 2011-10-24 – 2011-11-03 (×31): 300 mg via ORAL
  Filled 2011-10-24 (×34): qty 1

## 2011-10-24 MED ORDER — POTASSIUM CHLORIDE CRYS ER 20 MEQ PO TBCR
40.0000 meq | EXTENDED_RELEASE_TABLET | Freq: Once | ORAL | Status: AC
Start: 2011-10-24 — End: 2011-10-24
  Administered 2011-10-24: 40 meq via ORAL
  Filled 2011-10-24: qty 2

## 2011-10-24 MED ORDER — SODIUM CHLORIDE 0.9 % IV SOLN
INTRAVENOUS | Status: DC
  Administered 2011-10-24: 21:00:00 via INTRAVENOUS
  Administered 2011-10-24: 50 mL/h via INTRAVENOUS
  Administered 2011-10-25 (×2): via INTRAVENOUS
  Administered 2011-10-26: 100 mL via INTRAVENOUS
  Administered 2011-10-29: 11:00:00 via INTRAVENOUS

## 2011-10-24 MED ORDER — ONDANSETRON HCL 4 MG/2ML IJ SOLN: 4.0000 mg | Freq: Four times a day (QID) | INTRAMUSCULAR | Status: DC | PRN

## 2011-10-24 MED ORDER — INSULIN ASPART 100 UNIT/ML ~~LOC~~ SOLN
0.0000 [IU] | Freq: Three times a day (TID) | SUBCUTANEOUS | Status: DC
  Administered 2011-10-24: 1 [IU] via SUBCUTANEOUS
  Administered 2011-10-24: 3 [IU] via SUBCUTANEOUS
  Administered 2011-10-24: 2 [IU] via SUBCUTANEOUS
  Administered 2011-10-25: 1 [IU] via SUBCUTANEOUS
  Administered 2011-10-25: 2 [IU] via SUBCUTANEOUS
  Administered 2011-10-25: 1 [IU] via SUBCUTANEOUS
  Administered 2011-10-26: 2 [IU] via SUBCUTANEOUS
  Administered 2011-10-26: 3 [IU] via SUBCUTANEOUS
  Administered 2011-10-27: 1 [IU] via SUBCUTANEOUS
  Administered 2011-10-27 – 2011-10-28 (×3): 2 [IU] via SUBCUTANEOUS
  Administered 2011-10-28: 3 [IU] via SUBCUTANEOUS
  Administered 2011-10-28: 2 [IU] via SUBCUTANEOUS
  Administered 2011-10-29: 1 [IU] via SUBCUTANEOUS
  Administered 2011-10-29 – 2011-10-31 (×6): 2 [IU] via SUBCUTANEOUS
  Administered 2011-10-31: 1 [IU] via SUBCUTANEOUS
  Administered 2011-10-31 – 2011-11-01 (×3): 2 [IU] via SUBCUTANEOUS
  Administered 2011-11-01: 1 [IU] via SUBCUTANEOUS
  Administered 2011-11-02 (×2): 7 [IU] via SUBCUTANEOUS
  Administered 2011-11-02: 3 [IU] via SUBCUTANEOUS

## 2011-10-24 MED ORDER — ACETAMINOPHEN 650 MG RE SUPP: 650.0000 mg | Freq: Four times a day (QID) | RECTAL | Status: DC | PRN

## 2011-10-24 MED ORDER — SODIUM CHLORIDE 0.9 % IV BOLUS (SEPSIS)
2000.0000 mL | Freq: Once | INTRAVENOUS | Status: AC
  Administered 2011-10-24 (×2): 1000 mL via INTRAVENOUS

## 2011-10-24 MED ORDER — ONDANSETRON HCL 4 MG PO TABS: 4.0000 mg | ORAL_TABLET | Freq: Four times a day (QID) | ORAL | Status: DC | PRN

## 2011-10-24 MED ORDER — WARFARIN SODIUM 5 MG PO TABS
5.0000 mg | ORAL_TABLET | Freq: Once | ORAL | Status: AC
  Administered 2011-10-24: 5 mg via ORAL
  Filled 2011-10-24: qty 1

## 2011-10-24 NOTE — Care Management Note (Signed)
    Page 1 of 1   11/06/2011     8:18:20 AM   CARE MANAGEMENT NOTE 11/06/2011  Patient:  [name], [name]   Account Number:  [id number]  Date Initiated:  10/24/2011  Documentation initiated by:  Letha Cape  Subjective/Objective Assessment:   dx uti  admit- lives with spouse. pta independent.     Action/Plan:   Anticipated DC Date:  11/05/2011   Anticipated DC Plan:  SKILLED NURSING FACILITY  In-house referral  Clinical Social Worker      DC Planning Services  CM consult      Choice offered to / List presented to:             Status of service:  Completed, signed off Medicare Important Message given?   (If response is "NO", the following Medicare IM given date fields will be blank) Date Medicare IM given:   Date Additional Medicare IM given:    Discharge Disposition:  SKILLED NURSING FACILITY  Per UR Regulation:  Reviewed for med. necessity/level of care/duration of stay  If discussed at Long Length of Stay Meetings, dates discussed:   10/30/2011    Comments:  11/06/11 8:17 Letha Cape RN, BSN [phone] patient dc to snf, CSW following.  10/24/11 16:57 Letha Cape RN, BSN [phone] patient lives with spouse, pta independent, but looks weak now.  Patient has medication coverage and transportation. NCM will continue to follow for dc needs.

## 2011-10-24 NOTE — Progress Notes (Addendum)
ANTICOAGULATION CONSULT NOTE - Initial Consult  Pharmacy Consult for Coumadin/Zosyn Indication: atrial fibrillation/UTI  Allergies  Allergen Reactions  . Codeine Other (See Comments)    unknown  . Penicillins Other (See Comments)    unknown    Patient Measurements: Height: 5\' 9"  (175.3 cm) Weight: 155 lb 3.3 oz (70.4 kg) IBW/kg (Calculated) : 70.7   Vital Signs: Temp: 102.7 F (39.3 C) (05/16 0232) Temp src: Oral (05/16 0232) BP: 129/64 mmHg (05/16 0232) Pulse Rate: 71  (05/16 0232)  Labs:  [facility] 10/23/11 2049 10/23/11 1855  HGB -- 12.6*  HCT -- 37.8*  PLT -- 187  APTT -- --  LABPROT 20.3* --  INR 1.70* --  HEPARINUNFRC -- --  CREATININE -- 1.03  CKTOTAL -- --  CKMB -- --  TROPONINI -- --    Estimated Creatinine Clearance: 59.8 ml/min (by C-G formula based on Cr of 1.03).   Medical History: Past Medical History  Diagnosis Date  . A-fib   . CAD (coronary artery disease)   . Hyperlipidemia   . Diabetes mellitus   . Hypertension   . Suprapubic catheter     Medications:  Prescriptions prior to admission  Medication Sig Dispense Refill  . ACCU-CHEK AVIVA PLUS test strip TEST UP TO TWICE DAILY  100 each  3  . atorvastatin (LIPITOR) 80 MG tablet Take 80 mg by mouth daily.      . Blood Glucose Monitoring Suppl MISC        . digoxin (LANOXIN) 0.125 MG tablet Take 125 mcg by mouth daily.      . fish oil-omega-3 fatty acids 1000 MG capsule Take 1 g by mouth daily.        . metFORMIN (GLUCOPHAGE) 500 MG tablet Take 1 tablet (500 mg total) by mouth 2 (two) times daily with a meal.  60 tablet  3  . metoprolol succinate (TOPROL-XL) 50 MG 24 hr tablet Take 50 mg by mouth daily. Take with or immediately following a meal.      . omeprazole (PRILOSEC OTC) 20 MG tablet Take 20 mg by mouth daily.        . ramipril (ALTACE) 10 MG tablet Take 10 mg by mouth daily.      Marland Kitchen warfarin (COUMADIN) 3 MG tablet Take 1 tablet (3 mg total) by mouth daily.  40 tablet  5  .  AMBULATORY NON FORMULARY MEDICATION Medication Name: Shingles vaccine IM x 1  1 vial  0   Scheduled:    . acetaminophen  650 mg Oral Once  . amitriptyline  50 mg Oral QHS  . atorvastatin  80 mg Oral Daily  . cefTRIAXone (ROCEPHIN)  IV  1 g Intravenous Once  . digoxin  125 mcg Oral Daily  . gabapentin  300 mg Oral TID  . insulin aspart  0-9 Units Subcutaneous TID WC  . metoprolol succinate  100 mg Oral BID  . omega-3 acid ethyl esters  1 g Oral Q breakfast  . omeprazole  20 mg Oral Q1200  . ramipril  10 mg Oral Daily  . sodium chloride  1,000 mL Intravenous Once  . sodium chloride  500 mL Intravenous Once  . sodium chloride  3 mL Intravenous Q12H    Assessment: 78yo male c/o fever and chills x48hr, UA shows UTI (has suprapubic catheter s/p radiation), to begin Zosyn; also to continue Coumadin for Afib, admitted with subtherapeutic INR.  Goal of Therapy:  INR 2-3   Plan:  Will begin Zosyn  3.375g IV Q8H and monitor CBC.  Will give boosted Coumadin dose of 5mg  x1 today and adjust per INR.  Colleen Can PharmD BCPS 10/24/2011,2:36 AM  Adding vancomycin for urosepsis Vancomycin trough goal = 10 to 15 mcg/dl  1) Add Vancomycin 454 mg iv Q 12 hours 2) Continue to follow  Thank you.  Okey Regal, PharmD

## 2011-10-24 NOTE — Consult Note (Signed)
Reason for Consult: Right hip fracture Referring Physician: PCP  [name] is an 78 y.o. male.  HPI: 79yo admitted for urosepsis. Fell 4 times over last 3 months last 4 weeks ago. Wife reports months of hip pain and neuropathy right leg.  Past Medical History  Diagnosis Date  . A-fib   . CAD (coronary artery disease)   . Hyperlipidemia   . Diabetes mellitus   . Hypertension   . Suprapubic catheter   . Peripheral vascular disease   . Arthritis   . Cancer     prostate cancer    Past Surgical History  Procedure Date  . Prostatectomy     radiation therapy  . Bladder cath   . Laminectomy     lumbar  . Lumbar disc surgery   . Right colectomy   . Incisional hernia repair   . Oronary artery bypass   . Total hip arthroplasty     RT  . Cataract extraction 2012    Left 03/2011, Right 04/2011    Family History  Problem Relation Age of Onset  . Coronary artery disease      siblings  . Heart attack Father   . Diabetes Sister   . Hyperlipidemia      family history    Social History:  reports that he quit smoking about 41 years ago. His smoking use included Cigarettes. He does not have any smokeless tobacco history on file. He reports that he does not drink alcohol or use illicit drugs.  Allergies:  Allergies  Allergen Reactions  . Codeine Other (See Comments)    unknown  . Penicillins Other (See Comments)    unknown    Medications: I have reviewed the patient's current medications.  Results for orders placed during the hospital encounter of 10/23/11 (from the past 48 hour(s))  CBC     Status: Abnormal   Collection Time   10/23/11  6:55 PM      Component Value Range Comment   WBC 9.4  4.0 - 10.5 (K/uL)    RBC 3.89 (*) 4.22 - 5.81 (MIL/uL)    Hemoglobin 12.6 (*) 13.0 - 17.0 (g/dL)    HCT 16.1 (*) 09.6 - 52.0 (%)    MCV 97.2  78.0 - 100.0 (fL)    MCH 32.4  26.0 - 34.0 (pg)    MCHC 33.3  30.0 - 36.0 (g/dL)    RDW 04.5  40.9 - 81.1 (%)    Platelets 187  150 -  400 (K/uL)   DIFFERENTIAL     Status: Abnormal   Collection Time   10/23/11  6:55 PM      Component Value Range Comment   Neutrophils Relative 69  43 - 77 (%)    Neutro Abs 6.5  1.7 - 7.7 (K/uL)    Lymphocytes Relative 17  12 - 46 (%)    Lymphs Abs 1.6  0.7 - 4.0 (K/uL)    Monocytes Relative 14 (*) 3 - 12 (%)    Monocytes Absolute 1.3 (*) 0.1 - 1.0 (K/uL)    Eosinophils Relative 0  0 - 5 (%)    Eosinophils Absolute 0.0  0.0 - 0.7 (K/uL)    Basophils Relative 0  0 - 1 (%)    Basophils Absolute 0.0  0.0 - 0.1 (K/uL)   BASIC METABOLIC PANEL     Status: Abnormal   Collection Time   10/23/11  6:55 PM      Component Value Range Comment  Sodium 135  135 - 145 (mEq/L)    Potassium 4.3  3.5 - 5.1 (mEq/L)    Chloride 99  96 - 112 (mEq/L)    CO2 24  19 - 32 (mEq/L)    Glucose, Bld 159 (*) 70 - 99 (mg/dL)    BUN 13  6 - 23 (mg/dL)    Creatinine, Ser 3.08  0.50 - 1.35 (mg/dL)    Calcium 9.0  8.4 - 10.5 (mg/dL)    GFR calc non Af Amer 68 (*) >90 (mL/min)    GFR calc Af Amer 79 (*) >90 (mL/min)   LACTIC ACID, PLASMA     Status: Normal   Collection Time   10/23/11  7:31 PM      Component Value Range Comment   Lactic Acid, Venous 1.9  0.5 - 2.2 (mmol/L)   PROTIME-INR     Status: Abnormal   Collection Time   10/23/11  8:49 PM      Component Value Range Comment   Prothrombin Time 20.3 (*) 11.6 - 15.2 (seconds)    INR 1.70 (*) 0.00 - 1.49    GRAM STAIN     Status: Normal   Collection Time   10/23/11  9:36 PM      Component Value Range Comment   Specimen Description URINE, CATHETERIZED      Special Requests NONE      Gram Stain        Value: CYTOSPIN SLIDE     WBC PRESENT,BOTH PMN AND MONONUCLEAR     GRAM NEGATIVE RODS   Report Status 10/23/2011 FINAL     URINALYSIS, ROUTINE W REFLEX MICROSCOPIC     Status: Abnormal   Collection Time   10/23/11  9:37 PM      Component Value Range Comment   Color, Urine YELLOW  YELLOW     APPearance CLOUDY (*) CLEAR     Specific Gravity, Urine 1.012   1.005 - 1.030     pH 5.5  5.0 - 8.0     Glucose, UA NEGATIVE  NEGATIVE (mg/dL)    Hgb urine dipstick MODERATE (*) NEGATIVE     Bilirubin Urine NEGATIVE  NEGATIVE     Ketones, ur NEGATIVE  NEGATIVE (mg/dL)    Protein, ur NEGATIVE  NEGATIVE (mg/dL)    Urobilinogen, UA 0.2  0.0 - 1.0 (mg/dL)    Nitrite NEGATIVE  NEGATIVE     Leukocytes, UA LARGE (*) NEGATIVE    URINE MICROSCOPIC-ADD ON     Status: Abnormal   Collection Time   10/23/11  9:37 PM      Component Value Range Comment   Squamous Epithelial / LPF RARE  RARE     WBC, UA 21-50  <3 (WBC/hpf)    RBC / HPF 3-6  <3 (RBC/hpf)    Bacteria, UA FEW (*) RARE    MRSA PCR SCREENING     Status: Normal   Collection Time   10/24/11  3:00 AM      Component Value Range Comment   MRSA by PCR NEGATIVE  NEGATIVE    COMPREHENSIVE METABOLIC PANEL     Status: Abnormal   Collection Time   10/24/11  5:34 AM      Component Value Range Comment   Sodium 136  135 - 145 (mEq/L)    Potassium 3.4 (*) 3.5 - 5.1 (mEq/L)    Chloride 102  96 - 112 (mEq/L)    CO2 21  19 - 32 (mEq/L)    Glucose,  Bld 189 (*) 70 - 99 (mg/dL)    BUN 11  6 - 23 (mg/dL)    Creatinine, Ser 8.46  0.50 - 1.35 (mg/dL)    Calcium 8.0 (*) 8.4 - 10.5 (mg/dL)    Total Protein 6.0  6.0 - 8.3 (g/dL)    Albumin 2.8 (*) 3.5 - 5.2 (g/dL)    AST 18  0 - 37 (U/L)    ALT 13  0 - 53 (U/L)    Alkaline Phosphatase 63  39 - 117 (U/L)    Total Bilirubin 0.7  0.3 - 1.2 (mg/dL)    GFR calc non Af Amer 79 (*) >90 (mL/min)    GFR calc Af Amer >90  >90 (mL/min)   CBC     Status: Abnormal   Collection Time   10/24/11  5:34 AM      Component Value Range Comment   WBC 10.9 (*) 4.0 - 10.5 (K/uL)    RBC 3.27 (*) 4.22 - 5.81 (MIL/uL)    Hemoglobin 10.4 (*) 13.0 - 17.0 (g/dL) DELTA CHECK NOTED   HCT 31.3 (*) 39.0 - 52.0 (%)    MCV 95.7  78.0 - 100.0 (fL)    MCH 31.8  26.0 - 34.0 (pg)    MCHC 33.2  30.0 - 36.0 (g/dL)    RDW 96.2  95.2 - 84.1 (%)    Platelets 161  150 - 400 (K/uL)   PROTIME-INR      Status: Abnormal   Collection Time   10/24/11  5:34 AM      Component Value Range Comment   Prothrombin Time 19.7 (*) 11.6 - 15.2 (seconds)    INR 1.64 (*) 0.00 - 1.49    GLUCOSE, CAPILLARY     Status: Abnormal   Collection Time   10/24/11  8:05 AM      Component Value Range Comment   Glucose-Capillary 137 (*) 70 - 99 (mg/dL)   GLUCOSE, CAPILLARY     Status: Abnormal   Collection Time   10/24/11 12:07 PM      Component Value Range Comment   Glucose-Capillary 213 (*) 70 - 99 (mg/dL)   PROCALCITONIN     Status: Normal   Collection Time   10/24/11 12:26 PM      Component Value Range Comment   Procalcitonin 0.64     LACTIC ACID, PLASMA     Status: Normal   Collection Time   10/24/11 12:26 PM      Component Value Range Comment   Lactic Acid, Venous 2.1  0.5 - 2.2 (mmol/L)   GLUCOSE, CAPILLARY     Status: Abnormal   Collection Time   10/24/11  5:14 PM      Component Value Range Comment   Glucose-Capillary 132 (*) 70 - 99 (mg/dL)     Dg Chest 2 View  08/31/4008  *RADIOLOGY REPORT*  Clinical Data: Fatigue.  Fever.  Altered level of consciousness.  CHEST - 2 VIEW  Comparison: 05/09/2009  Findings: Prior CABG noted.  Mild cardiomegaly is present with indistinct pulmonary vasculature and cephalization of blood flow suggesting pulmonary venous hypertension.  Overt edema is not observed.  Linear subsegmental atelectasis noted at the left lung base.  Atherosclerotic calcification of the aortic arch is noted.  No pleural effusion is identified.  IMPRESSION:  1.  Cardiomegaly with pulmonary venous hypertension.  No overt edema. 2.  Atherosclerosis.  Original Report Authenticated By: Dellia Cloud, M.D.   Dg Hip Complete Right  10/24/2011  *RADIOLOGY  REPORT*  Clinical Data: Right hip pain.  Remote hip surgery.  RIGHT HIP - COMPLETE 2+ VIEW  Comparison: 05/10/2009.  Findings: Interval protrusio of the right hip prosthesis with areas of lucency and sclerosis around the acetabular cup.  The  acetabular cup has rotated into a more vertical position.  The femoral component is intact.  No findings for loosening or fracture.  There appear to be pathologic or stress fractures involving the obturator ring.  Inferior pubic ramus fracture noted and probable fracture at the ischiopubic junction. CT recommended for further evaluation and preoperative planning.  IMPRESSION:  1.  Protrusio and rotation of the acetabular cup. 2.  Pathologic /stress fractures involving the obturator ring. Cannot exclude underlying particle disease or infection.  CT recommended for further evaluation.  Original Report Authenticated By: P. Loralie Champagne, M.D.   Ct Hip Right Wo Contrast  10/24/2011  *RADIOLOGY REPORT*  Clinical Data: Right hip pain.  Obturator ring fracture.  CT OF THE RIGHT HIP WITHOUT CONTRAST  Technique:  Multidetector CT imaging was performed according to the standard protocol. Multiplanar CT image reconstructions were also generated.  Comparison: Radiographs dated 10/24/2011  Findings: There is a comminuted displaced fracture of the right acetabulum.  There is also a fragmented fracture of the medial aspect of the right inferior pubic ramus and a nondisplaced fracture of the superior pubic ramus adjacent to the acetabulum.  The acetabular component of the total hip prosthesis has migrated superiorly within the ilium.  The screws extend into the iliacus muscle.  There is what appears to be an old fracture of the right ilium at the posterior inferior aspect of the sacroiliac joint. There is high density material within the inferior aspect of the acetabulum which may either represent cement or old hemorrhage. There is enlargement of the right obturator internus muscle probably due to hemorrhage secondary to the pubic rami fractures.  There is also some dystrophic calcification in the soft tissues posterior to the right superior pubic ramus which may represent dystrophic calcification in old hemorrhage.  The  femoral component of the right total hip prosthesis appears in good position with no evidence of loosening.  Proximal femur is intact.  Suprapubic catheter is in place.  IMPRESSION: Comminuted fractures of the right acetabulum with a large defect in the medial wall.  Fractures of the right inferior and superior pubic rami.  Incompletely healed fracture of the posterior aspect of the right ilium adjacent to the inferior aspect of the right SI joint.  Original Report Authenticated By: Gwynn Burly, M.D.    Review of Systems  Constitutional: Positive for malaise/fatigue.  HENT: Negative.   Eyes: Negative.   Respiratory: Negative.   Cardiovascular: Negative.   Gastrointestinal: Negative.   Genitourinary: Positive for dysuria.  Musculoskeletal: Positive for joint pain.  Neurological: Positive for sensory change.  Endo/Heme/Allergies: Negative.   Psychiatric/Behavioral: Negative.    Blood pressure 139/64, pulse 74, temperature 98.2 F (36.8 C), temperature source Oral, resp. rate 18, height 5\' 9"  (1.753 m), weight 70.4 kg (155 lb 3.3 oz), SpO2 96.00%. Physical Exam  Vitals reviewed. Constitutional: He is oriented to person, place, and time.  HENT:  Head: Normocephalic and atraumatic.  Eyes: Pupils are equal, round, and reactive to light.  Neck: Normal range of motion.  Cardiovascular: Normal rate.   Respiratory: Effort normal.  Musculoskeletal:       Right leg shortened. 1+ DP PT weak dorsiflexion. Sensation intact. No DVT.  Neurological: He is alert and oriented to  person, place, and time.  Skin: Skin is warm and dry.  Psychiatric: He has a normal mood and affect.    Assessment/Plan:Suacute fractures right acetabulum and rami with protrusio of hip prothesis. Recommend NWB. NV checks. Will consult Dr. Despina Hick.  Jawad Wiacek C 10/24/2011, 5:42 PM

## 2011-10-24 NOTE — Progress Notes (Signed)
PT Cancellation Note  Treatment cancelled today due to medical issues with patient which prohibited therapy. Noted possible fracture R hip on x-ray, spoke with Dr. Arthor Captain, will hold PT eval until ortho consult complete. Await weight bearing clarification if PT eval still needed.   [facility] HELEN 10/24/2011, 11:37 AM Pager: [phone]

## 2011-10-24 NOTE — Progress Notes (Signed)
INITIAL ADULT NUTRITION ASSESSMENT Date: 10/24/2011   Time: 11:04 AM Reason for Assessment: Nutrition Risk, unintentional weight loss  ASSESSMENT: Male 78 y.o.  Dx: UTI (lower urinary tract infection)  Hx:  Past Medical History  Diagnosis Date  . A-fib   . CAD (coronary artery disease)   . Hyperlipidemia   . Diabetes mellitus   . Hypertension   . Suprapubic catheter   . Peripheral vascular disease   . Arthritis   . Cancer     prostate cancer    Related Meds:     . acetaminophen  650 mg Oral Once  . amitriptyline  50 mg Oral QHS  . atorvastatin  80 mg Oral Daily  . cefTRIAXone (ROCEPHIN)  IV  1 g Intravenous Once  . digoxin  125 mcg Oral Daily  . gabapentin  300 mg Oral TID  . insulin aspart  0-9 Units Subcutaneous TID WC  . metoprolol succinate  100 mg Oral BID  . omega-3 acid ethyl esters  1 g Oral Q breakfast  . omeprazole  20 mg Oral Q1200  . piperacillin-tazobactam  3.375 g Intravenous Once  . piperacillin-tazobactam (ZOSYN)  IV  3.375 g Intravenous Q8H  . ramipril  10 mg Oral Daily  . sodium chloride  1,000 mL Intravenous Once  . sodium chloride  500 mL Intravenous Once  . sodium chloride  3 mL Intravenous Q12H  . warfarin  5 mg Oral ONCE-1800  . Warfarin - Pharmacist Dosing Inpatient   Does not apply q1800     Ht: 5\' 9"  (175.3 cm)  Wt: 155 lb 3.3 oz (70.4 kg)  Ideal Wt: 72.7 kg % Ideal Wt: 97%  Usual Wt: 175-180 lbs per pt's wife, about 6 months ago Wt Readings from Last 10 Encounters:  10/24/11 155 lb 3.3 oz (70.4 kg)  09/16/11 152 lb (68.947 kg)  05/20/11 154 lb (69.854 kg)  04/16/11 156 lb (70.761 kg)  04/03/11 156 lb (70.761 kg)  03/15/11 162 lb (73.483 kg)  12/31/10 165 lb (74.844 kg)  10/22/10 163 lb (73.936 kg)  07/31/10 155 lb (70.308 kg)  06/14/10 162 lb (73.483 kg)    % Usual Wt: 89%  Body mass index is 22.92 kg/(m^2). WNL  Food/Nutrition Related Hx: Pt's wife states pt has had a decreased appetite for several months (6-7) and  has been losing weight. Started after pt quit working and was not able to be up and around as much. Pt's wife states that pt was fairly active until recently.   Labs:  CMP     Component Value Date/Time   NA 136 10/24/2011 0534   K 3.4* 10/24/2011 0534   CL 102 10/24/2011 0534   CO2 21 10/24/2011 0534   GLUCOSE 189* 10/24/2011 0534   BUN 11 10/24/2011 0534   CREATININE 0.94 10/24/2011 0534   CREATININE 0.96 04/03/2011 0857   CALCIUM 8.0* 10/24/2011 0534   PROT 6.0 10/24/2011 0534   ALBUMIN 2.8* 10/24/2011 0534   AST 18 10/24/2011 0534   ALT 13 10/24/2011 0534   ALKPHOS 63 10/24/2011 0534   BILITOT 0.7 10/24/2011 0534   GFRNONAA 79* 10/24/2011 0534   GFRAA >90 10/24/2011 0534     Intake/Output Summary (Last 24 hours) at 10/24/11 1108 Last data filed at 10/24/11 0636  Gross per 24 hour  Intake 284.17 ml  Output    450 ml  Net -165.83 ml      Diet Order: Carb Control  Supplements/Tube Feeding: none  IVF:  sodium chloride Last Rate: 50 mL/hr (10/24/11 0119)    Estimated Nutritional Needs:   Kcal: 6213-0865 Protein: 75-85 gm Fluid:  > 1.8 L  Per pt's wife, pt has had weight loss over the past 6-7 months. Per weight hx documentation pt has lost about 10 lbs in the last 7 months, 6%, no significant. Pt likely with decreased intake r/t decreased activity.Pt also with fever > 103 F, increasing needs. Pt currently afebrile.  Pt's wife agreeable to starting Glucerna Shakes TID with meals.   NUTRITION DIAGNOSIS: -Inadequate oral intake (NI-2.1).  Status: Ongoing  RELATED TO: decreased appetite  AS EVIDENCE BY: weight loss  MONITORING/EVALUATION(Goals): Goal: PO intake of meals and supplements will be adequate to maintain weight Monitor: PO intake, weight, labs, I/O's  EDUCATION NEEDS: -No education needs identified at this time  INTERVENTION: 1. Glucerna Shake TID 2. RD will continue to follow  Dietitian [phone]  DOCUMENTATION CODES Per approved criteria  -Not  Applicable    Clarene Duke MARIE 10/24/2011, 11:04 AM

## 2011-10-24 NOTE — Progress Notes (Signed)
DAILY PROGRESS NOTE                              [facility]  SUBJECTIVE: Awake but disoriented, wife at bedside.  OBJECTIVE: BP 160/77  Pulse 76  Temp(Src) 103.1 F (39.5 C) (Axillary)  Resp 24  Ht 5\' 9"  (1.753 m)  Wt 70.4 kg (155 lb 3.3 oz)  BMI 22.92 kg/m2  SpO2 98%  Intake/Output Summary (Last 24 hours) at 10/24/11 1131 Last data filed at 10/24/11 0636  Gross per 24 hour  Intake 284.17 ml  Output    450 ml  Net -165.83 ml                      Weight change:  Physical Exam: General: Alert, disoriented not in any acute distress. HEENT: anicteric sclera, pupils equal reactive to light and accommodation CVS: S1-S2 heard, no murmur rubs or gallops Chest: clear to auscultation bilaterally, no wheezing rales or rhonchi Abdomen:  normal bowel sounds, soft, nontender, nondistended, no organomegaly Neuro: Cranial nerves II-XII intact, no focal neurological deficits Extremities: no cyanosis, no clubbing or edema noted bilaterally   Lab Results:  [facility] 10/24/11 0534 10/23/11 1855  NA 136 135  K 3.4* 4.3  CL 102 99  CO2 21 24  GLUCOSE 189* 159*  BUN 11 13  CREATININE 0.94 1.03  CALCIUM 8.0* 9.0  MG -- --  PHOS -- --    Basename 10/24/11 0534  AST 18  ALT 13  ALKPHOS 63  BILITOT 0.7  PROT 6.0  ALBUMIN 2.8*   No results found for this basename: LIPASE:2,AMYLASE:2 in the last 72 hours  Basename 10/24/11 0534 10/23/11 1855  WBC 10.9* 9.4  NEUTROABS -- 6.5  HGB 10.4* 12.6*  HCT 31.3* 37.8*  MCV 95.7 97.2  PLT 161 187   No results found for this basename: CKTOTAL:3,CKMB:3,CKMBINDEX:3,TROPONINI:3 in the last 72 hours No components found with this basename: POCBNP:3 No results found for this basename: DDIMER:2 in the last 72 hours No results found for this basename: HGBA1C:2 in the last 72 hours No results found for this basename: CHOL:2,HDL:2,LDLCALC:2,TRIG:2,CHOLHDL:2,LDLDIRECT:2 in the last 72 hours No results found for  this basename: TSH,T4TOTAL,FREET3,T3FREE,THYROIDAB in the last 72 hours No results found for this basename: VITAMINB12:2,FOLATE:2,FERRITIN:2,TIBC:2,IRON:2,RETICCTPCT:2 in the last 72 hours  Micro Results: Recent Results (from the past 240 hour(s))  GRAM STAIN     Status: Normal   Collection Time   10/23/11  9:36 PM      Component Value Range Status Comment   Specimen Description URINE, CATHETERIZED   Final    Special Requests NONE   Final    Gram Stain     Final    Value: CYTOSPIN SLIDE     WBC PRESENT,BOTH PMN AND MONONUCLEAR     GRAM NEGATIVE RODS   Report Status 10/23/2011 FINAL   Final   MRSA PCR SCREENING     Status: Normal   Collection Time   10/24/11  3:00 AM      Component Value Range Status Comment   MRSA by PCR NEGATIVE  NEGATIVE  Final     Studies/Results: Dg Chest 2 View  10/23/2011  *RADIOLOGY REPORT*  Clinical Data: Fatigue.  Fever.  Altered level of consciousness.  CHEST - 2 VIEW  Comparison: 05/09/2009  Findings: Prior CABG noted.  Mild cardiomegaly is present with indistinct pulmonary vasculature and cephalization of blood flow suggesting  pulmonary venous hypertension.  Overt edema is not observed.  Linear subsegmental atelectasis noted at the left lung base.  Atherosclerotic calcification of the aortic arch is noted.  No pleural effusion is identified.  IMPRESSION:  1.  Cardiomegaly with pulmonary venous hypertension.  No overt edema. 2.  Atherosclerosis.  Original Report Authenticated By: Dellia Cloud, M.D.   Dg Hip Complete Right  10/24/2011  *RADIOLOGY REPORT*  Clinical Data: Right hip pain.  Remote hip surgery.  RIGHT HIP - COMPLETE 2+ VIEW  Comparison: 05/10/2009.  Findings: Interval protrusio of the right hip prosthesis with areas of lucency and sclerosis around the acetabular cup.  The acetabular cup has rotated into a more vertical position.  The femoral component is intact.  No findings for loosening or fracture.  There appear to be pathologic or stress  fractures involving the obturator ring.  Inferior pubic ramus fracture noted and probable fracture at the ischiopubic junction. CT recommended for further evaluation and preoperative planning.  IMPRESSION:  1.  Protrusio and rotation of the acetabular cup. 2.  Pathologic /stress fractures involving the obturator ring. Cannot exclude underlying particle disease or infection.  CT recommended for further evaluation.  Original Report Authenticated By: P. Loralie Champagne, M.D.   Medications: Scheduled Meds:   . acetaminophen  650 mg Oral Once  . amitriptyline  50 mg Oral QHS  . atorvastatin  80 mg Oral Daily  . cefTRIAXone (ROCEPHIN)  IV  1 g Intravenous Once  . digoxin  125 mcg Oral Daily  . feeding supplement  237 mL Oral TID BM  . gabapentin  300 mg Oral TID  . insulin aspart  0-9 Units Subcutaneous TID WC  . metoprolol succinate  100 mg Oral BID  . omega-3 acid ethyl esters  1 g Oral Q breakfast  . omeprazole  20 mg Oral Q1200  . piperacillin-tazobactam  3.375 g Intravenous Once  . piperacillin-tazobactam (ZOSYN)  IV  3.375 g Intravenous Q8H  . ramipril  10 mg Oral Daily  . sodium chloride  1,000 mL Intravenous Once  . sodium chloride  500 mL Intravenous Once  . sodium chloride  3 mL Intravenous Q12H  . warfarin  5 mg Oral ONCE-1800  . Warfarin - Pharmacist Dosing Inpatient   Does not apply q1800   Continuous Infusions:   . sodium chloride 50 mL/hr (10/24/11 0119)   PRN Meds:.acetaminophen, acetaminophen, ondansetron (ZOFRAN) IV, ondansetron  ASSESSMENT & PLAN: Principal Problem:  *UTI (lower urinary tract infection) Active Problems:  DIABETES MELLITUS, TYPE II  CAD, ARTERY BYPASS GRAFT  Atrial fibrillation  Fever   UTI -Complicated, suprapubic catheter related UTI. -Catheter was change in the emergency department last night. -Still spiking fever on Zosyn I will add vancomycin as patient last night had staph UTI. -Blood cultures obtained, and I will get procalcitonin and  lactate.  Chronic pain and weakness in the right hip -X-ray showed protrusion and migration of the acetabular cup -Stress fracture of the obturator ring. -I'll get a CT scan and notify his orthopedist Dr. Lequita Halt.  Diabetes mellitus type 2 -Controlled with hemoglobin of 6.9. -Continue preadmission medication, utilize SSI and carb modified diet while in the hospital.  Atrial fibrillation -Rate controlled. -On chronic Coumadin, pharmacy is to dose of Coumadin.  History of prostate cancer -Patient had previous treatment. I'll get PSA level.    LOS: 1 day   Emmalou Hunger A 10/24/2011, 11:31 AM

## 2011-10-24 NOTE — H&P (Signed)
[name] is an 78 y.o. male.  PCP - Dr.Catherine Mehteney.  Chief Complaint: Fever and chills. HPI: 77 year-old male with known history of prostate cancer status post radiation and is on suprapubic catheter, diabetes mellitus type 2, CAD status post CABG, atrial fibrillation on Coumadin was brought to the ER because of increasing fever and chills over the last 48 hours. Patient denies any nausea vomiting abdominal pain or diarrhea. Did not have any shortness of breath cough or phlegm or chest pain. In the ER patient's urinalysis showed features compatible with UTI and fever of 102F. Patient has been started IV antibiotics and will be admitted for further management. Patient is hemodynamically stable at this time. In addition patient's wife states that he has chronic right lower extremity weakness and pain which she feels has worsened with regarding to pain. Denies any fall.   Past Medical History  Diagnosis Date  . A-fib   . CAD (coronary artery disease)   . Hyperlipidemia   . Diabetes mellitus   . Hypertension   . Suprapubic catheter     Past Surgical History  Procedure Date  . Prostatectomy     radiation therapy  . Bladder cath   . Laminectomy     lumbar  . Lumbar disc surgery   . Right colectomy   . Incisional hernia repair   . [place]ronary artery bypass   . Total hip arthroplasty     RT  . Cataract extraction 2012    Left 03/2011, Right 04/2011    Family History  Problem Relation Age of Onset  . Coronary artery disease      siblings  . Heart attack Father   . Diabetes Sister   . Hyperlipidemia      family history   Social History:  reports that he quit smoking about 41 years ago. His smoking use included Cigarettes. He does not have any smokeless tobacco history on file. He reports that he does not drink alcohol. His drug history not on file.  Allergies:  Allergies  Allergen Reactions  . Codeine Other (See Comments)    unknown  . Penicillins Other (See Comments)      unknown     (Not in a hospital admission)  Results for orders placed during the hospital encounter of 10/23/11 (from the past 48 hour(s))  CBC     Status: Abnormal   Collection Time   10/23/11  6:55 PM      Component Value Range Comment   WBC 9.4  4.0 - 10.5 (K/uL)    RBC 3.89 (*) 4.22 - 5.81 (MIL/uL)    Hemoglobin 12.6 (*) 13.0 - 17.0 (g/dL)    HCT 40.9 (*) 81.1 - 52.0 (%)    MCV 97.2  78.0 - 100.0 (fL)    MCH 32.4  26.0 - 34.0 (pg)    MCHC 33.3  30.0 - 36.0 (g/dL)    RDW 91.4  78.2 - 95.6 (%)    Platelets 187  150 - 400 (K/uL)   DIFFERENTIAL     Status: Abnormal   Collection Time   10/23/11  6:55 PM      Component Value Range Comment   Neutrophils Relative 69  43 - 77 (%)    Neutro Abs 6.5  1.7 - 7.7 (K/uL)    Lymphocytes Relative 17  12 - 46 (%)    Lymphs Abs 1.6  0.7 - 4.0 (K/uL)    Monocytes Relative 14 (*) 3 - 12 (%)  Monocytes Absolute 1.3 (*) 0.1 - 1.0 (K/uL)    Eosinophils Relative 0  0 - 5 (%)    Eosinophils Absolute 0.0  0.0 - 0.7 (K/uL)    Basophils Relative 0  0 - 1 (%)    Basophils Absolute 0.0  0.0 - 0.1 (K/uL)   BASIC METABOLIC PANEL     Status: Abnormal   Collection Time   10/23/11  6:55 PM      Component Value Range Comment   Sodium 135  135 - 145 (mEq/L)    Potassium 4.3  3.5 - 5.1 (mEq/L)    Chloride 99  96 - 112 (mEq/L)    CO2 24  19 - 32 (mEq/L)    Glucose, Bld 159 (*) 70 - 99 (mg/dL)    BUN 13  6 - 23 (mg/dL)    Creatinine, Ser 1.61  0.50 - 1.35 (mg/dL)    Calcium 9.0  8.4 - 10.5 (mg/dL)    GFR calc non Af Amer 68 (*) >90 (mL/min)    GFR calc Af Amer 79 (*) >90 (mL/min)   LACTIC ACID, PLASMA     Status: Normal   Collection Time   10/23/11  7:31 PM      Component Value Range Comment   Lactic Acid, Venous 1.9  0.5 - 2.2 (mmol/L)   PROTIME-INR     Status: Abnormal   Collection Time   10/23/11  8:49 PM      Component Value Range Comment   Prothrombin Time 20.3 (*) 11.6 - 15.2 (seconds)    INR 1.70 (*) 0.00 - 1.49    GRAM STAIN     Status:  Normal   Collection Time   10/23/11  9:36 PM      Component Value Range Comment   Specimen Description URINE, CATHETERIZED      Special Requests NONE      Gram Stain        Value: CYTOSPIN SLIDE     WBC PRESENT,BOTH PMN AND MONONUCLEAR     GRAM NEGATIVE RODS   Report Status 10/23/2011 FINAL     URINALYSIS, ROUTINE W REFLEX MICROSCOPIC     Status: Abnormal   Collection Time   10/23/11  9:37 PM      Component Value Range Comment   Color, Urine YELLOW  YELLOW     APPearance CLOUDY (*) CLEAR     Specific Gravity, Urine 1.012  1.005 - 1.030     pH 5.5  5.0 - 8.0     Glucose, UA NEGATIVE  NEGATIVE (mg/dL)    Hgb urine dipstick MODERATE (*) NEGATIVE     Bilirubin Urine NEGATIVE  NEGATIVE     Ketones, ur NEGATIVE  NEGATIVE (mg/dL)    Protein, ur NEGATIVE  NEGATIVE (mg/dL)    Urobilinogen, UA 0.2  0.0 - 1.0 (mg/dL)    Nitrite NEGATIVE  NEGATIVE     Leukocytes, UA LARGE (*) NEGATIVE    URINE MICROSCOPIC-ADD ON     Status: Abnormal   Collection Time   10/23/11  9:37 PM      Component Value Range Comment   Squamous Epithelial / LPF RARE  RARE     WBC, UA 21-50  <3 (WBC/hpf)    RBC / HPF 3-6  <3 (RBC/hpf)    Bacteria, UA FEW (*) RARE     Dg Chest 2 View  10/23/2011  *RADIOLOGY REPORT*  Clinical Data: Fatigue.  Fever.  Altered level of consciousness.  CHEST - 2 VIEW  Comparison: 05/09/2009  Findings: Prior CABG noted.  Mild cardiomegaly is present with indistinct pulmonary vasculature and cephalization of blood flow suggesting pulmonary venous hypertension.  Overt edema is not observed.  Linear subsegmental atelectasis noted at the left lung base.  Atherosclerotic calcification of the aortic arch is noted.  No pleural effusion is identified.  IMPRESSION:  1.  Cardiomegaly with pulmonary venous hypertension.  No overt edema. 2.  Atherosclerosis.  Original Report Authenticated By: Dellia Cloud, M.D.    Review of Systems  Constitutional: Positive for fever and chills.  HENT: Negative.     Eyes: Negative.   Respiratory: Negative.   Cardiovascular: Negative.   Gastrointestinal: Negative.   Genitourinary: Negative.   Musculoskeletal: Negative.   Skin: Negative.   Neurological: Negative.   Endo/Heme/Allergies: Negative.   Psychiatric/Behavioral: Negative.     Blood pressure 139/54, pulse 76, temperature 102.4 F (39.1 C), temperature source Oral, resp. rate 23, SpO2 99.00%. Physical Exam  Constitutional: He is oriented to person, place, and time. He appears well-developed and well-nourished. No distress.  HENT:  Head: Normocephalic and atraumatic.  Right Ear: External ear normal.  Left Ear: External ear normal.  Nose: Nose normal.  Mouth/Throat: Oropharynx is clear and moist. No oropharyngeal exudate.  Eyes: Conjunctivae are normal. Pupils are equal, round, and reactive to light. Right eye exhibits no discharge. Left eye exhibits no discharge. No scleral icterus.  Neck: Normal range of motion. Neck supple.  Cardiovascular: Normal rate and regular rhythm.   Respiratory: Effort normal and breath sounds normal. No respiratory distress. He has no wheezes. He has no rales.  GI: Soft. Bowel sounds are normal. He exhibits no distension. There is no tenderness. There is no rebound.       Has suprapubic catheter.The site does not look infected.  Musculoskeletal: He exhibits no edema and no tenderness.  Neurological: He is alert and oriented to person, place, and time.       Mild weakness in his right lower extremity and pain in the knee on flexion. Which is chronic per family.  Skin: Skin is warm and dry. He is not diaphoretic.  Psychiatric: His behavior is normal.     Assessment/Plan #1. Complicated UTI with suprapubic catheterization - will treat with Zosyn. Obtain urine cultures. May consult Dr. Isabel Caprice patient's urologist for possible change in catheter.  #2. Chronic weakness and pain in the right hip - patient's wife feels the pain may have increased. We'll get a x-ray  of the right hip and physical therapy consult. There is no obvious swelling or erythema around the hip site. #3. CAD status post CABG - presently chest pain-free. #4. Atrial fibrillation rate controlled on Coumadin - Coumadin per pharmacy. #5. History of prostate cancer status post radiation. #6. History of perforated bowel. #7. Diabetes mellitus 2 - continue home medications with sliding scale coverage.  CODE STATUS - full code.   Josemanuel Eakins N. 10/24/2011, 1:04 AM

## 2011-10-25 DIAGNOSIS — E782 Mixed hyperlipidemia: Secondary | ICD-10-CM

## 2011-10-25 DIAGNOSIS — R509 Fever, unspecified: Secondary | ICD-10-CM

## 2011-10-25 DIAGNOSIS — I4891 Unspecified atrial fibrillation: Secondary | ICD-10-CM

## 2011-10-25 DIAGNOSIS — I2510 Atherosclerotic heart disease of native coronary artery without angina pectoris: Secondary | ICD-10-CM

## 2011-10-25 LAB — GLUCOSE, CAPILLARY
Glucose-Capillary: 138 mg/dL — ABNORMAL HIGH (ref 70–99)
Glucose-Capillary: 180 mg/dL — ABNORMAL HIGH (ref 70–99)
Glucose-Capillary: 194 mg/dL — ABNORMAL HIGH (ref 70–99)

## 2011-10-25 LAB — BASIC METABOLIC PANEL
BUN: 11 mg/dL (ref 6–23)
CO2: 20 mEq/L (ref 19–32)
Chloride: 106 mEq/L (ref 96–112)
Creatinine, Ser: 0.99 mg/dL (ref 0.50–1.35)
GFR calc Af Amer: 89 mL/min — ABNORMAL LOW (ref >90)
Glucose, Bld: 112 mg/dL — ABNORMAL HIGH (ref 70–99)

## 2011-10-25 LAB — CBC
HCT: 31.7 % — ABNORMAL LOW (ref 39.0–52.0)
MCH: 32.4 pg (ref 26.0–34.0)
MCHC: 33.4 g/dL (ref 30.0–36.0)
MCV: 96.9 fL (ref 78.0–100.0)
RDW: 14.3 % (ref 11.5–15.5)

## 2011-10-25 LAB — PROTIME-INR: INR: 1.45 (ref 0.00–1.49)

## 2011-10-25 MED ORDER — WARFARIN SODIUM 7.5 MG PO TABS
7.5000 mg | ORAL_TABLET | Freq: Once | ORAL | Status: AC
  Administered 2011-10-25: 7.5 mg via ORAL
  Filled 2011-10-25: qty 1

## 2011-10-25 MED ORDER — OXYCODONE-ACETAMINOPHEN 5-325 MG PO TABS
1.0000 | ORAL_TABLET | ORAL | Status: DC | PRN
  Administered 2011-10-25: 1 via ORAL
  Administered 2011-10-26 – 2011-10-28 (×2): 2 via ORAL
  Administered 2011-10-29 – 2011-11-04 (×2): 1 via ORAL
  Filled 2011-10-25: qty 1
  Filled 2011-10-25 (×2): qty 2
  Filled 2011-10-25 (×2): qty 1

## 2011-10-25 MED ORDER — MORPHINE SULFATE 2 MG/ML IJ SOLN
1.0000 mg | INTRAMUSCULAR | Status: DC | PRN
  Administered 2011-10-28: 1 mg via INTRAVENOUS
  Filled 2011-10-25: qty 1

## 2011-10-25 MED ORDER — METOPROLOL SUCCINATE ER 100 MG PO TB24
100.0000 mg | ORAL_TABLET | Freq: Two times a day (BID) | ORAL | Status: DC
  Administered 2011-10-25 – 2011-11-03 (×17): 100 mg via ORAL
  Filled 2011-10-25 (×22): qty 1

## 2011-10-25 NOTE — Progress Notes (Signed)
Subjective: Patient continues with hip pain s/p fall     Vital signs in last 24 hours: Temp:  [98 F (36.7 C)-103.1 F (39.5 C)] 99.6 F (37.6 C) (05/17 0434) Pulse Rate:  [57-95] 57  (05/17 0434) Resp:  [18-20] 18  (05/17 0434) BP: (95-139)/(48-81) 124/65 mmHg (05/17 0434) SpO2:  [96 %-100 %] 99 % (05/17 0434)  Intake/Output from previous day: 05/16 0701 - 05/17 0700 In: 2264.5 [P.O.:462; I.V.:1802.5] Out: 700 [Urine:700] Intake/Output this shift: Total I/O In: -  Out: 300 [Urine:300]   Basename 10/25/11 0520 10/24/11 0534 10/23/11 1855  HGB 10.6* 10.4* 12.6*    Basename 10/25/11 0520 10/24/11 0534  WBC 13.8* 10.9*  RBC 3.27* 3.27*  HCT 31.7* 31.3*  PLT 152 161    Basename 10/25/11 0520 10/24/11 0534  NA 137 136  K 3.8 3.4*  CL 106 102  CO2 20 21  BUN 11 11  CREATININE 0.99 0.94  GLUCOSE 112* 189*  CALCIUM 8.2* 8.0*    Basename 10/25/11 0520 10/24/11 0534  LABPT -- --  INR 1.45 1.64*    Neurologically intact Sensation intact distally No cellulitis present pain with ROM right hip  Assessment/Plan:     We have spoken with Dr Lequita Halt who was primary surgeon he has also reviewed the xrays Patient would need extensive revision to the hip not likely a candidate for that procedure Plan at this time is NWB to the right leg Aluisio will likely speak with family about long term treatment options  Vangie Henthorn R. 10/25/2011, 8:59 AM

## 2011-10-25 NOTE — Progress Notes (Signed)
DAILY PROGRESS NOTE                              [facility]  SUBJECTIVE: Awake, alert and oriented. Feels much better than yesterday.  OBJECTIVE: BP 137/57  Pulse 63  Temp(Src) 98.5 F (36.9 C) (Axillary)  Resp 18  Ht 5\' 9"  (1.753 m)  Wt 70.4 kg (155 lb 3.3 oz)  BMI 22.92 kg/m2  SpO2 99%  Intake/Output Summary (Last 24 hours) at 10/25/11 1009 Last data filed at 10/25/11 4540  Gross per 24 hour  Intake 2504.5 ml  Output   1001 ml  Net 1503.5 ml                      Weight change:  Physical Exam: General: Alert, disoriented not in any acute distress. HEENT: anicteric sclera, pupils equal reactive to light and accommodation CVS: S1-S2 heard, no murmur rubs or gallops Chest: clear to auscultation bilaterally, no wheezing rales or rhonchi Abdomen:  normal bowel sounds, soft, nontender, nondistended, no organomegaly Neuro: Cranial nerves II-XII intact, no focal neurological deficits Extremities: no cyanosis, no clubbing or edema noted bilaterally   Lab Results:  [facility] 10/25/11 0520 10/24/11 0534  NA 137 136  K 3.8 3.4*  CL 106 102  CO2 20 21  GLUCOSE 112* 189*  BUN 11 11  CREATININE 0.99 0.94  CALCIUM 8.2* 8.0*  MG -- --  PHOS -- --    Basename 10/24/11 0534  AST 18  ALT 13  ALKPHOS 63  BILITOT 0.7  PROT 6.0  ALBUMIN 2.8*   No results found for this basename: LIPASE:2,AMYLASE:2 in the last 72 hours  Basename 10/25/11 0520 10/24/11 0534 10/23/11 1855  WBC 13.8* 10.9* --  NEUTROABS -- -- 6.5  HGB 10.6* 10.4* --  HCT 31.7* 31.3* --  MCV 96.9 95.7 --  PLT 152 161 --   No results found for this basename: CKTOTAL:3,CKMB:3,CKMBINDEX:3,TROPONINI:3 in the last 72 hours No components found with this basename: POCBNP:3 No results found for this basename: DDIMER:2 in the last 72 hours No results found for this basename: HGBA1C:2 in the last 72 hours No results found for this basename:  CHOL:2,HDL:2,LDLCALC:2,TRIG:2,CHOLHDL:2,LDLDIRECT:2 in the last 72 hours No results found for this basename: TSH,T4TOTAL,FREET3,T3FREE,THYROIDAB in the last 72 hours No results found for this basename: VITAMINB12:2,FOLATE:2,FERRITIN:2,TIBC:2,IRON:2,RETICCTPCT:2 in the last 72 hours  Micro Results: Recent Results (from the past 240 hour(s))  CULTURE, BLOOD (ROUTINE X 2)     Status: Normal (Preliminary result)   Collection Time   10/23/11  8:49 PM      Component Value Range Status Comment   Specimen Description BLOOD HAND RIGHT   Final    Special Requests BOTTLES DRAWN AEROBIC ONLY 10CC   Final    Culture  Setup Time [id number]   Final    Culture     Final    Value:        BLOOD CULTURE RECEIVED NO GROWTH TO DATE CULTURE WILL BE HELD FOR 5 DAYS BEFORE ISSUING A FINAL NEGATIVE REPORT   Report Status PENDING   Incomplete   CULTURE, BLOOD (ROUTINE X 2)     Status: Normal (Preliminary result)   Collection Time   10/23/11  8:57 PM      Component Value Range Status Comment   Specimen Description BLOOD HAND LEFT   Final    Special Requests BOTTLES DRAWN AEROBIC ONLY 6CC  Final    Culture  Setup Time [id number]   Final    Culture     Final    Value:        BLOOD CULTURE RECEIVED NO GROWTH TO DATE CULTURE WILL BE HELD FOR 5 DAYS BEFORE ISSUING A FINAL NEGATIVE REPORT   Report Status PENDING   Incomplete   GRAM STAIN     Status: Normal   Collection Time   10/23/11  9:36 PM      Component Value Range Status Comment   Specimen Description URINE, CATHETERIZED   Final    Special Requests NONE   Final    Gram Stain     Final    Value: CYTOSPIN SLIDE     WBC PRESENT,BOTH PMN AND MONONUCLEAR     GRAM NEGATIVE RODS   Report Status 10/23/2011 FINAL   Final   URINE CULTURE     Status: Normal   Collection Time   10/23/11  9:37 PM      Component Value Range Status Comment   Specimen Description URINE, CATHETERIZED   Final    Special Requests NONE   Final    Culture  Setup Time [id number]    Final    Colony Count >=100,000 COLONIES/ML   Final    Culture     Final    Value: Multiple bacterial morphotypes present, none predominant. Suggest appropriate recollection if clinically indicated.   Report Status 10/24/2011 FINAL   Final   MRSA PCR SCREENING     Status: Normal   Collection Time   10/24/11  3:00 AM      Component Value Range Status Comment   MRSA by PCR NEGATIVE  NEGATIVE  Final     Studies/Results: Dg Chest 2 View  10/23/2011  *RADIOLOGY REPORT*  Clinical Data: Fatigue.  Fever.  Altered level of consciousness.  CHEST - 2 VIEW  Comparison: 05/09/2009  Findings: Prior CABG noted.  Mild cardiomegaly is present with indistinct pulmonary vasculature and cephalization of blood flow suggesting pulmonary venous hypertension.  Overt edema is not observed.  Linear subsegmental atelectasis noted at the left lung base.  Atherosclerotic calcification of the aortic arch is noted.  No pleural effusion is identified.  IMPRESSION:  1.  Cardiomegaly with pulmonary venous hypertension.  No overt edema. 2.  Atherosclerosis.  Original Report Authenticated By: Dellia Cloud, M.D.   Dg Hip Complete Right  10/24/2011  *RADIOLOGY REPORT*  Clinical Data: Right hip pain.  Remote hip surgery.  RIGHT HIP - COMPLETE 2+ VIEW  Comparison: 05/10/2009.  Findings: Interval protrusio of the right hip prosthesis with areas of lucency and sclerosis around the acetabular cup.  The acetabular cup has rotated into a more vertical position.  The femoral component is intact.  No findings for loosening or fracture.  There appear to be pathologic or stress fractures involving the obturator ring.  Inferior pubic ramus fracture noted and probable fracture at the ischiopubic junction. CT recommended for further evaluation and preoperative planning.  IMPRESSION:  1.  Protrusio and rotation of the acetabular cup. 2.  Pathologic /stress fractures involving the obturator ring. Cannot exclude underlying particle disease or  infection.  CT recommended for further evaluation.  Original Report Authenticated By: P. Loralie Champagne, M.D.   Medications: Scheduled Meds:    . amitriptyline  50 mg Oral QHS  . atorvastatin  80 mg Oral Daily  . digoxin  125 mcg Oral Daily  . feeding supplement  237 mL Oral TID BM  .  gabapentin  300 mg Oral TID  . insulin aspart  0-9 Units Subcutaneous TID WC  . metoprolol succinate  100 mg Oral BID  . omega-3 acid ethyl esters  1 g Oral Q breakfast  . omeprazole  20 mg Oral Q1200  . piperacillin-tazobactam (ZOSYN)  IV  3.375 g Intravenous Q8H  . potassium chloride  40 mEq Oral Once  . ramipril  10 mg Oral Daily  . sodium chloride  2,000 mL Intravenous Once  . sodium chloride  3 mL Intravenous Q12H  . vancomycin  750 mg Intravenous Once  . vancomycin  750 mg Intravenous Q12H  . warfarin  5 mg Oral ONCE-1800  . warfarin  7.5 mg Oral ONCE-1800  . Warfarin - Pharmacist Dosing Inpatient   Does not apply q1800   Continuous Infusions:    . sodium chloride 100 mL/hr at 10/25/11 0740   PRN Meds:.acetaminophen, acetaminophen, ondansetron (ZOFRAN) IV, ondansetron  ASSESSMENT & PLAN: Principal Problem:  *UTI (lower urinary tract infection) Active Problems:  DIABETES MELLITUS, TYPE II  CAD, ARTERY BYPASS GRAFT  Atrial fibrillation  Fever   UTI -Complicated, suprapubic catheter related UTI. -Catheter was change in the emergency department last night. -Still spiking fever on Zosyn, vancomycin added as patient last night had staph UTI. -Blood cultures obtained, repeat urine culture at the current one showing multiple morphotypes  Chronic pain and weakness in the right hip -X-ray showed protrusion and migration of the acetabular cup -Stress fracture of the obturator ring. -Dr. Lequita Halt to see, per orthopedic notes patient seemed like poor surgical candidate.  Diabetes mellitus type 2 -Controlled with hemoglobin of 6.9. -Continue preadmission medication, utilize SSI and carb  modified diet while in the hospital.  Atrial fibrillation -Rate controlled. -On chronic Coumadin, pharmacy is to dose of Coumadin.  History of prostate cancer -Patient had previous treatment. I'll get PSA level.    LOS: 2 days   Alexis Reber A 10/25/2011, 10:09 AM

## 2011-10-25 NOTE — Progress Notes (Signed)
10/25/11 1515  Informed Dr. Arthor Captain of pt. noted to cough occ. while eating esp. after drinking. Leandrew Koyanagi Prima Rayner,RN

## 2011-10-25 NOTE — Evaluation (Signed)
Physical Therapy Evaluation Patient Details Name: [name] MRN: [id number] DOB: 1931-11-06 Today's Date: 10/25/2011 Time: 4098-1191 PT Time Calculation (min): 19 min  PT Assessment / Plan / Recommendation Clinical Impression  Pt adm with complicated UTI and rt. hip pain.  Wife reports multiple falls over past several months with incr hip pain.  Pt found to have rt acetabular fx at hip prosthesis.  Pt NWB on RLE.  Expect pt will be limited to transfers due to complicated hip fx.  Recommend ST-SNF.    PT Assessment  Patient needs continued PT services    Follow Up Recommendations  Skilled nursing facility    Barriers to Discharge        lEquipment Recommendations  Defer to next venue    Recommendations for Other Services     Frequency Min 3X/week    Precautions / Restrictions Precautions Precautions: Fall Restrictions Weight Bearing Restrictions: Yes RLE Weight Bearing: Non weight bearing   Pertinent Vitals/Pain Severe pain in rt hip with activity      Mobility  Bed Mobility Bed Mobility: Supine to Sit;Sitting - Scoot to [place] of Bed;Sit to Supine;Rolling Right;Rolling Left Rolling Right: 1: +1 Total assist Rolling Left: 1: +1 Total assist Supine to Sit: 1: +1 Total assist;HOB elevated Sitting - Scoot to Edge of Bed: 1: +1 Total assist Sit to Supine: 1: +2 Total assist Sit to Supine: Patient Percentage: 20% Details for Bed Mobility Assistance: Pt unable required assist with trunk and with legs.    Exercises     PT Diagnosis: Acute pain;Difficulty walking  PT Problem List: Decreased activity tolerance;Pain;Decreased mobility;Decreased balance;Decreased strength PT Treatment Interventions: DME instruction;Functional mobility training;Therapeutic activities;Therapeutic exercise;Balance training;Patient/family education;Wheelchair mobility training   PT Goals Acute Rehab PT Goals PT Goal Formulation: With patient Time For Goal Achievement: 11/08/11 Potential to  Achieve Goals: Fair Pt will go Supine/Side to Sit: with min assist PT Goal: Supine/Side to Sit - Progress: Goal set today Pt will Sit at Edge of Bed: with supervision;3-5 min;with bilateral upper extremity support PT Goal: Sit at Edge Of Bed - Progress: Goal set today Pt will go Sit to Supine/Side: with min assist PT Goal: Sit to Supine/Side - Progress: Goal set today Pt will Transfer Bed to Chair/Chair to Bed: with +2 total assist (pt=50%) PT Transfer Goal: Bed to Chair/Chair to Bed - Progress: Goal set today Pt will Propel Wheelchair: 51 - 150 feet;with min assist PT Goal: Propel Wheelchair - Progress: Goal set today  Visit Information  Last PT Received On: 10/25/11 Assistance Needed: +2    Subjective Data  Subjective: Wife states that pt has fallen multiple times since January and hip pain has been getting worse and worse. Patient Stated Goal: Return home   Prior Functioning  Home Living Lives With: Spouse Available Help at Discharge: Family Type of Home: Mobile home Home Access: Ramped entrance Home Layout: One level Bathroom Shower/Tub: Naval architect Equipment: Bedside commode/3-in-1;Walker - rolling;Built-in shower seat Prior Function Level of Independence: Independent with assistive device(s) (used rolling walker with incr difficulty as pain inc) Able to Take Stairs?: No Vocation: Retired    IT consultant  Overall Cognitive Status: Impaired Area of Impairment: Attention;Following commands Arousal/Alertness: Awake/alert (slow to respond) Behavior During Session: Other (comment) (slow to process and respond) Following Commands: Follows one step commands with increased time    Extremity/Trunk Assessment Right Lower Extremity Assessment RLE ROM/Strength/Tone: Deficits;Due to pain RLE ROM/Strength/Tone Deficits: Needs assist to move at all Left Lower Extremity Assessment LLE  ROM/Strength/Tone: Within functional levels   Balance Static Sitting Balance Static  Sitting - Balance Support: Bilateral upper extremity supported Static Sitting - Level of Assistance: 2: Max assist;4: Min assist Static Sitting - Comment/# of Minutes: Sat EOB x 5-6 minutes with heavy lean to rt.  Pt using lt hand on footboard of bed to try and hold himself up.  End of Session PT - End of Session Activity Tolerance: Patient limited by pain Patient left: in bed;with call bell/phone within reach;with bed alarm set;with family/visitor present Nurse Communication: Mobility status;Need for lift equipment   [name] 10/25/2011, 2:56 PM  [facility] PT [phone]

## 2011-10-25 NOTE — Progress Notes (Signed)
ANTICOAGULATION CONSULT NOTE - Follow-Up Consult  Pharmacy Consult for Coumadin Indication: atrial fibrillation  Allergies  Allergen Reactions  . Codeine Other (See Comments)    unknown  . Penicillins Other (See Comments)    unknown   Labs:  Basename 10/25/11 0520 10/24/11 0534 10/23/11 2049 10/23/11 1855  HGB 10.6* 10.4* -- --  HCT 31.7* 31.3* -- 37.8*  PLT 152 161 -- 187  APTT -- -- -- --  LABPROT 17.9* 19.7* 20.3* --  INR 1.45 1.64* 1.70* --  HEPARINUNFRC -- -- -- --  CREATININE 0.99 0.94 -- 1.03  CKTOTAL -- -- -- --  CKMB -- -- -- --  TROPONINI -- -- -- --    Estimated Creatinine Clearance: 62.2 ml/min (by C-G formula based on Cr of 0.99).   Assessment: INR=1.45 On Coumadin PTA for Afib  Goal of Therapy:  INR 2-3   Plan:  1) Coumadin 7.5 mg po x 1 dose tonight 2) Add Lovenox for DVT prophylaxis? 3) Daily INR  Thank you Okey Regal, PharmD Clinical Pharmacist [phone]

## 2011-10-25 NOTE — Plan of Care (Signed)
Problem: Phase I Progression Outcomes Goal: Voiding-avoid urinary catheter unless indicated Outcome: Not Applicable Date Met:  10/25/11 Has supra pubic catheter-chronic.

## 2011-10-26 DIAGNOSIS — S72009A Fracture of unspecified part of neck of unspecified femur, initial encounter for closed fracture: Secondary | ICD-10-CM

## 2011-10-26 LAB — CBC
MCV: 96.6 fL (ref 78.0–100.0)
Platelets: 135 10*3/uL — ABNORMAL LOW (ref 150–400)
RBC: 2.95 MIL/uL — ABNORMAL LOW (ref 4.22–5.81)
WBC: 12.4 10*3/uL — ABNORMAL HIGH (ref 4.0–10.5)

## 2011-10-26 LAB — BASIC METABOLIC PANEL
CO2: 23 mEq/L (ref 19–32)
Chloride: 110 mEq/L (ref 96–112)
Creatinine, Ser: 1.1 mg/dL (ref 0.50–1.35)
GFR calc Af Amer: 73 mL/min — ABNORMAL LOW (ref >90)
Potassium: 3.5 mEq/L (ref 3.5–5.1)

## 2011-10-26 LAB — PROTIME-INR: INR: 2.13 — ABNORMAL HIGH (ref 0.00–1.49)

## 2011-10-26 LAB — GLUCOSE, CAPILLARY: Glucose-Capillary: 109 mg/dL — ABNORMAL HIGH (ref 70–99)

## 2011-10-26 MED ORDER — WARFARIN SODIUM 3 MG PO TABS
3.0000 mg | ORAL_TABLET | Freq: Once | ORAL | Status: AC
  Administered 2011-10-26: 3 mg via ORAL
  Filled 2011-10-26: qty 1

## 2011-10-26 MED ORDER — IBUPROFEN 200 MG PO TABS
200.0000 mg | ORAL_TABLET | Freq: Once | ORAL | Status: AC
  Administered 2011-10-26: 200 mg via ORAL
  Filled 2011-10-26: qty 1

## 2011-10-26 NOTE — Progress Notes (Signed)
[name]  MRN: [id number] DOB/Age: [birth year] 78 y.o. Physician: Lynnea Maizes, M.D.      Subjective: Comfortable in bed, eating breakfast Vital Signs Temp:  [97.3 F (36.3 C)-102.9 F (39.4 C)] 97.3 F (36.3 C) (05/18 0400) Pulse Rate:  [63-86] 70  (05/18 0908) Resp:  [18-20] 18  (05/18 0400) BP: (109-141)/(57-69) 109/69 mmHg (05/18 0908) SpO2:  [98 %-100 %] 100 % (05/18 0400)  Lab Results  Basename 10/26/11 0620 10/25/11 0520  WBC 12.4* 13.8*  HGB 9.4* 10.6*  HCT 28.5* 31.7*  PLT 135* 152   BMET  Basename 10/26/11 0620 10/25/11 0520  NA 141 137  K 3.5 3.8  CL 110 106  CO2 23 20  GLUCOSE 134* 112*  BUN 16 11  CREATININE 1.10 0.99  CALCIUM 8.2* 8.2*   INR  Date Value Range Status  10/26/2011 2.13* 0.00-1.49 (no units) Final  10/14/2011 1.5   Final     POC INR  Date Value Range Status  07/13/2009 2.5   Final     Exam  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 Lequita Halt just spoke with patient regarding treatment options and will see patient in the office after discharge. [name] M 10/26/2011, 9:25 AM

## 2011-10-26 NOTE — Progress Notes (Signed)
Notified Claiborne Billings, NP that patient's temp was 102.9 tonight. Gave tylenol 650mg  as ordered. Patient's temp went down to 101.7. Claiborne Billings, NP stated he would put order in for ibuprofen. Will continue to monitor patient. Nelda Marseille, RN

## 2011-10-26 NOTE — ED Provider Notes (Signed)
I saw and evaluated the patient, reviewed the resident's note and I agree with the findings and plan.  Pt states too weak for home and wife agrees, doubt septic shock.  Hurman Horn, MD 10/26/11 [phone]

## 2011-10-26 NOTE — Progress Notes (Signed)
DAILY PROGRESS NOTE                              [facility]  SUBJECTIVE: Awake, alert and oriented. Feels much better than yesterday. Wife at bedside.  OBJECTIVE: BP 109/69  Pulse 70  Temp(Src) 97.3 F (36.3 C) (Oral)  Resp 18  Ht 5\' 9"  (1.753 m)  Wt 70.4 kg (155 lb 3.3 oz)  BMI 22.92 kg/m2  SpO2 100%  Intake/Output Summary (Last 24 hours) at 10/26/11 1051 Last data filed at 10/26/11 0800  Gross per 24 hour  Intake 3491.67 ml  Output    851 ml  Net 2640.67 ml                      Weight change:  Physical Exam: General: Alert, disoriented not in any acute distress. HEENT: anicteric sclera, pupils equal reactive to light and accommodation CVS: S1-S2 heard, no murmur rubs or gallops Chest: clear to auscultation bilaterally, no wheezing rales or rhonchi Abdomen:  normal bowel sounds, soft, nontender, nondistended, no organomegaly Neuro: Cranial nerves II-XII intact, no focal neurological deficits Extremities: no cyanosis, no clubbing or edema noted bilaterally   Lab Results:  [facility] 10/26/11 0620 10/25/11 0520  NA 141 137  K 3.5 3.8  CL 110 106  CO2 23 20  GLUCOSE 134* 112*  BUN 16 11  CREATININE 1.10 0.99  CALCIUM 8.2* 8.2*  MG -- --  PHOS -- --    Basename 10/24/11 0534  AST 18  ALT 13  ALKPHOS 63  BILITOT 0.7  PROT 6.0  ALBUMIN 2.8*   No results found for this basename: LIPASE:2,AMYLASE:2 in the last 72 hours  Basename 10/26/11 0620 10/25/11 0520 10/23/11 1855  WBC 12.4* 13.8* --  NEUTROABS -- -- 6.5  HGB 9.4* 10.6* --  HCT 28.5* 31.7* --  MCV 96.6 96.9 --  PLT 135* 152 --   No results found for this basename: CKTOTAL:3,CKMB:3,CKMBINDEX:3,TROPONINI:3 in the last 72 hours No components found with this basename: POCBNP:3 No results found for this basename: DDIMER:2 in the last 72 hours No results found for this basename: HGBA1C:2 in the last 72 hours No results found for this basename:  CHOL:2,HDL:2,LDLCALC:2,TRIG:2,CHOLHDL:2,LDLDIRECT:2 in the last 72 hours No results found for this basename: TSH,T4TOTAL,FREET3,T3FREE,THYROIDAB in the last 72 hours No results found for this basename: VITAMINB12:2,FOLATE:2,FERRITIN:2,TIBC:2,IRON:2,RETICCTPCT:2 in the last 72 hours  Micro Results: Recent Results (from the past 240 hour(s))  CULTURE, BLOOD (ROUTINE X 2)     Status: Normal (Preliminary result)   Collection Time   10/23/11  8:49 PM      Component Value Range Status Comment   Specimen Description BLOOD HAND RIGHT   Final    Special Requests BOTTLES DRAWN AEROBIC ONLY 10CC   Final    Culture  Setup Time [id number]   Final    Culture     Final    Value:        BLOOD CULTURE RECEIVED NO GROWTH TO DATE CULTURE WILL BE HELD FOR 5 DAYS BEFORE ISSUING A FINAL NEGATIVE REPORT   Report Status PENDING   Incomplete   CULTURE, BLOOD (ROUTINE X 2)     Status: Normal (Preliminary result)   Collection Time   10/23/11  8:57 PM      Component Value Range Status Comment   Specimen Description BLOOD HAND LEFT   Final    Special Requests BOTTLES DRAWN  AEROBIC ONLY [facility]   Final    Culture  Setup Time [id number]   Final    Culture     Final    Value:        BLOOD CULTURE RECEIVED NO GROWTH TO DATE CULTURE WILL BE HELD FOR 5 DAYS BEFORE ISSUING A FINAL NEGATIVE REPORT   Report Status PENDING   Incomplete   GRAM STAIN     Status: Normal   Collection Time   10/23/11  9:36 PM      Component Value Range Status Comment   Specimen Description URINE, CATHETERIZED   Final    Special Requests NONE   Final    Gram Stain     Final    Value: CYTOSPIN SLIDE     WBC PRESENT,BOTH PMN AND MONONUCLEAR     GRAM NEGATIVE RODS   Report Status 10/23/2011 FINAL   Final   URINE CULTURE     Status: Normal   Collection Time   10/23/11  9:37 PM      Component Value Range Status Comment   Specimen Description URINE, CATHETERIZED   Final    Special Requests NONE   Final    Culture  Setup Time [id number]    Final    Colony Count >=100,000 COLONIES/ML   Final    Culture     Final    Value: Multiple bacterial morphotypes present, none predominant. Suggest appropriate recollection if clinically indicated.   Report Status 10/24/2011 FINAL   Final   MRSA PCR SCREENING     Status: Normal   Collection Time   10/24/11  3:00 AM      Component Value Range Status Comment   MRSA by PCR NEGATIVE  NEGATIVE  Final     Studies/Results: Dg Chest 2 View  10/23/2011  *RADIOLOGY REPORT*  Clinical Data: Fatigue.  Fever.  Altered level of consciousness.  CHEST - 2 VIEW  Comparison: 05/09/2009  Findings: Prior CABG noted.  Mild cardiomegaly is present with indistinct pulmonary vasculature and cephalization of blood flow suggesting pulmonary venous hypertension.  Overt edema is not observed.  Linear subsegmental atelectasis noted at the left lung base.  Atherosclerotic calcification of the aortic arch is noted.  No pleural effusion is identified.  IMPRESSION:  1.  Cardiomegaly with pulmonary venous hypertension.  No overt edema. 2.  Atherosclerosis.  Original Report Authenticated By: Dellia Cloud, M.D.   Dg Hip Complete Right  10/24/2011  *RADIOLOGY REPORT*  Clinical Data: Right hip pain.  Remote hip surgery.  RIGHT HIP - COMPLETE 2+ VIEW  Comparison: 05/10/2009.  Findings: Interval protrusio of the right hip prosthesis with areas of lucency and sclerosis around the acetabular cup.  The acetabular cup has rotated into a more vertical position.  The femoral component is intact.  No findings for loosening or fracture.  There appear to be pathologic or stress fractures involving the obturator ring.  Inferior pubic ramus fracture noted and probable fracture at the ischiopubic junction. CT recommended for further evaluation and preoperative planning.  IMPRESSION:  1.  Protrusio and rotation of the acetabular cup. 2.  Pathologic /stress fractures involving the obturator ring. Cannot exclude underlying particle disease or  infection.  CT recommended for further evaluation.  Original Report Authenticated By: P. Loralie Champagne, M.D.   Medications: Scheduled Meds:    . amitriptyline  50 mg Oral QHS  . atorvastatin  80 mg Oral Daily  . digoxin  125 mcg Oral Daily  . feeding supplement  237 mL Oral TID BM  . gabapentin  300 mg Oral TID  . ibuprofen  200 mg Oral Once  . insulin aspart  0-9 Units Subcutaneous TID WC  . metoprolol succinate  100 mg Oral BID  . omega-3 acid ethyl esters  1 g Oral Q breakfast  . omeprazole  20 mg Oral Q1200  . piperacillin-tazobactam (ZOSYN)  IV  3.375 g Intravenous Q8H  . ramipril  10 mg Oral Daily  . sodium chloride  3 mL Intravenous Q12H  . vancomycin  750 mg Intravenous Q12H  . warfarin  7.5 mg Oral ONCE-1800  . Warfarin - Pharmacist Dosing Inpatient   Does not apply q1800   Continuous Infusions:    . sodium chloride 100 mL/hr at 10/26/11 0500   PRN Meds:.acetaminophen, acetaminophen, morphine injection, ondansetron (ZOFRAN) IV, ondansetron, oxyCODONE-acetaminophen  ASSESSMENT & PLAN: Principal Problem:  *UTI (lower urinary tract infection) Active Problems:  DIABETES MELLITUS, TYPE II  CAD, ARTERY BYPASS GRAFT  Atrial fibrillation  Fever   UTI -Complicated, suprapubic catheter related UTI. -Catheter was change in the emergency department at the time of admission. -Was spiking fever on Zosyn, vancomycin added as patient had history of staph UTI. -Blood cultures obtained, repeat urine culture as the current culture showing multiple morphotypes.  Chronic pain and weakness in the right hip -X-ray showed protrusion and migration of the acetabular cup -Stress fracture of the obturator ring. -Orthopedics recommended to followup with him as outpatient. -Likely patient have surgery after the current infection resolves.  Diabetes mellitus type 2 -Controlled with hemoglobin of 6.9. -Continue preadmission medication, utilize SSI and carb modified diet while in the  hospital.  Atrial fibrillation -Rate controlled. -On chronic Coumadin, pharmacy is to dose of Coumadin.  History of prostate cancer -Patient had previous treatment. PSA is suppressed.    LOS: 3 days   Vijay Durflinger A 10/26/2011, 10:51 AM

## 2011-10-26 NOTE — Progress Notes (Signed)
ANTICOAGULATION CONSULT NOTE - Follow Up Consult  Pharmacy Consult for Coumadin Indication: atrial fibrillation  Allergies  Allergen Reactions  . Codeine Other (See Comments)    unknown  . Penicillins Other (See Comments)    unknown    Patient Measurements: Height: 5\' 9"  (175.3 cm) Weight: 155 lb 3.3 oz (70.4 kg) IBW/kg (Calculated) : 70.7  Heparin Dosing Weight:   Vital Signs: Temp: 97.3 F (36.3 C) (05/18 0400) Temp src: Oral (05/18 0400) BP: 109/69 mmHg (05/18 0908) Pulse Rate: 62  (05/18 1138)  Labs:  Basename 10/26/11 0620 10/25/11 0520 10/24/11 0534  HGB 9.4* 10.6* --  HCT 28.5* 31.7* 31.3*  PLT 135* 152 161  APTT -- -- --  LABPROT 24.2* 17.9* 19.7*  INR 2.13* 1.45 1.64*  HEPARINUNFRC -- -- --  CREATININE 1.10 0.99 0.94  CKTOTAL -- -- --  CKMB -- -- --  TROPONINI -- -- --    Estimated Creatinine Clearance: 56 ml/min (by C-G formula based on Cr of 1.1).  Assessment: 79yom continuing chronic Coumadin for Afib. INR (2.13) is now therapeutic after significantly trending up overnight. Per Med Rec, PTA Coumadin regimen is 3mg  daily.  - H/H and Plts trending down - No significant bleeding reported   Goal of Therapy:  INR 2-3 Monitor platelets by anticoagulation protocol: Yes   Plan:  1. Coumadin 3mg  po x 1 today 2. Follow-up AM INR  Cleon Dew [phone] 10/26/2011,12:58 PM

## 2011-10-27 LAB — URINE CULTURE
Colony Count: NO GROWTH
Culture: NO GROWTH

## 2011-10-27 LAB — CBC
HCT: 26.7 % — ABNORMAL LOW (ref 39.0–52.0)
MCH: 32.3 pg (ref 26.0–34.0)
MCV: 95.7 fL (ref 78.0–100.0)
Platelets: 141 10*3/uL — ABNORMAL LOW (ref 150–400)
RDW: 14.7 % (ref 11.5–15.5)

## 2011-10-27 LAB — BASIC METABOLIC PANEL
BUN: 14 mg/dL (ref 6–23)
CO2: 22 mEq/L (ref 19–32)
Calcium: 8.2 mg/dL — ABNORMAL LOW (ref 8.4–10.5)
Chloride: 106 mEq/L (ref 96–112)
Creatinine, Ser: 0.98 mg/dL (ref 0.50–1.35)
Glucose, Bld: 147 mg/dL — ABNORMAL HIGH (ref 70–99)

## 2011-10-27 LAB — GLUCOSE, CAPILLARY
Glucose-Capillary: 150 mg/dL — ABNORMAL HIGH (ref 70–99)
Glucose-Capillary: 165 mg/dL — ABNORMAL HIGH (ref 70–99)

## 2011-10-27 LAB — PROTIME-INR: INR: 3.57 — ABNORMAL HIGH (ref 0.00–1.49)

## 2011-10-27 MED ORDER — POTASSIUM CHLORIDE CRYS ER 20 MEQ PO TBCR
40.0000 meq | EXTENDED_RELEASE_TABLET | Freq: Once | ORAL | Status: AC
  Administered 2011-10-27: 40 meq via ORAL
  Filled 2011-10-27: qty 2

## 2011-10-27 NOTE — Progress Notes (Signed)
DAILY PROGRESS NOTE                              GENERAL INTERNAL [facility]IC[facility]  SUBJECTIVE: Awake, alert and oriented. Had a spike of fever last night of 101.5F  OBJECTIVE: BP 146/88  Pulse 86  Temp(Src) 98.4 F (36.9 C) (Oral)  Resp 20  Ht 5\' 9"  (1.753 m)  Wt 70.4 kg (155 lb 3.3 oz)  BMI 22.92 kg/m2  SpO2 97%  Intake/Output Summary (Last 24 hours) at 10/27/11 1057 Last data filed at 10/27/11 0900  Gross per 24 hour  Intake   3570 ml  Output   1152 ml  Net   2418 ml                      Weight change:  Physical Exam: General: Alert, disoriented not in any acute distress. HEENT: anicteric sclera, pupils equal reactive to light and accommodation CVS: S1-S2 heard, no murmur rubs or gallops Chest: clear to auscultation bilaterally, no wheezing rales or rhonchi Abdomen:  normal bowel sounds, soft, nontender, nondistended, no organomegaly Neuro: Cranial nerves II-XII intact, no focal neurological deficits Extremities: no cyanosis, no clubbing or edema noted bilaterally   Lab Results:  Basename 10/27/11 0540 10/26/11 0620  NA 137 141  K 3.6 3.5  CL 106 110  CO2 22 23  GLUCOSE 147* 134*  BUN 14 16  CREATININE 0.98 1.10  CALCIUM 8.2* 8.2*  MG -- --  PHOS -- --   No results found for this basename: AST:2,ALT:2,ALKPHOS:2,BILITOT:2,PROT:2,ALBUMIN:2 in the last 72 hours No results found for this basename: LIPASE:2,AMYLASE:2 in the last 72 hours  Basename 10/27/11 0540 10/26/11 0620  WBC 11.8* 12.4*  NEUTROABS -- --  HGB 9.0* 9.4*  HCT 26.7* 28.5*  MCV 95.7 96.6  PLT 141* 135*   No results found for this basename: CKTOTAL:3,CKMB:3,CKMBINDEX:3,TROPONINI:3 in the last 72 hours No components found with this basename: POCBNP:3 No results found for this basename: DDIMER:2 in the last 72 hours No results found for this basename: HGBA1C:2 in the last 72 hours No results found for this basename: CHOL:2,HDL:2,LDLCALC:2,TRIG:2,CHOLHDL:2,LDLDIRECT:2 in the last  72 hours No results found for this basename: TSH,T4TOTAL,FREET3,T5FREE,THYROIDAB in the last 72 hours No results found for this basename: VITAMINB12:2,FOLATE:2,FERRITIN:2,TIBC:2,IRON:2,RETICCTPCT:2 in the last 72 hours  Micro Results: Recent Results (from the past 240 hour(s))  CULTURE, BLOOD (ROUTINE X 2)     Status: Normal (Preliminary result)   Collection Time   10/23/11  8:49 PM      Component Value Range Status Comment   Specimen Description BLOOD HAND RIGHT   Final    Special Requests BOTTLES DRAWN AEROBIC ONLY 10CC   Final    Culture  Setup Time [id number]   Final    Culture     Final    Value:        BLOOD CULTURE RECEIVED NO GROWTH TO DATE CULTURE WILL BE HELD FOR 5 DAYS BEFORE ISSUING A FINAL NEGATIVE REPORT   Report Status PENDING   Incomplete   CULTURE, BLOOD (ROUTINE X 2)     Status: Normal (Preliminary result)   Collection Time   10/23/11  8:57 PM      Component Value Range Status Comment   Specimen Description BLOOD HAND LEFT   Final    Special Requests BOTTLES DRAWN AEROBIC ONLY [facility]   Final    Culture  Setup Time [id number]  Final    Culture     Final    Value:        BLOOD CULTURE RECEIVED NO GROWTH TO DATE CULTURE WILL BE HELD FOR 5 DAYS BEFORE ISSUING A FINAL NEGATIVE REPORT   Report Status PENDING   Incomplete   GRAM STAIN     Status: Normal   Collection Time   10/23/11  9:36 PM      Component Value Range Status Comment   Specimen Description URINE, CATHETERIZED   Final    Special Requests NONE   Final    Gram Stain     Final    Value: CYTOSPIN SLIDE     WBC PRESENT,BOTH PMN AND MONONUCLEAR     GRAM NEGATIVE RODS   Report Status 10/23/2011 FINAL   Final   URINE CULTURE     Status: Normal   Collection Time   10/23/11  9:37 PM      Component Value Range Status Comment   Specimen Description URINE, CATHETERIZED   Final    Special Requests NONE   Final    Culture  Setup Time [id number]   Final    Colony Count >=100,000 COLONIES/ML   Final    Culture      Final    Value: Multiple bacterial morphotypes present, none predominant. Suggest appropriate recollection if clinically indicated.   Report Status 10/24/2011 FINAL   Final   MRSA PCR SCREENING     Status: Normal   Collection Time   10/24/11  3:00 AM      Component Value Range Status Comment   MRSA by PCR NEGATIVE  NEGATIVE  Final   URINE CULTURE     Status: Normal   Collection Time   10/25/11  7:33 PM      Component Value Range Status Comment   Specimen Description URINE, SUPRAPUBIC   Final    Special Requests NONE   Final    Culture  Setup Time [id number]   Final    Colony Count NO GROWTH   Final    Culture NO GROWTH   Final    Report Status 10/27/2011 FINAL   Final     Studies/Results: Dg Chest 2 View  10/23/2011  *RADIOLOGY REPORT*  Clinical Data: Fatigue.  Fever.  Altered level of consciousness.  CHEST - 2 VIEW  Comparison: 05/09/2009  Findings: Prior CABG noted.  Mild cardiomegaly is present with indistinct pulmonary vasculature and cephalization of blood flow suggesting pulmonary venous hypertension.  Overt edema is not observed.  Linear subsegmental atelectasis noted at the left lung base.  Atherosclerotic calcification of the aortic arch is noted.  No pleural effusion is identified.  IMPRESSION:  1.  Cardiomegaly with pulmonary venous hypertension.  No overt edema. 2.  Atherosclerosis.  Original Report Authenticated By: Dellia Cloud, M.D.   Dg Hip Complete Right  10/24/2011  *RADIOLOGY REPORT*  Clinical Data: Right hip pain.  Remote hip surgery.  RIGHT HIP - COMPLETE 2+ VIEW  Comparison: 05/10/2009.  Findings: Interval protrusio of the right hip prosthesis with areas of lucency and sclerosis around the acetabular cup.  The acetabular cup has rotated into a more vertical position.  The femoral component is intact.  No findings for loosening or fracture.  There appear to be pathologic or stress fractures involving the obturator ring.  Inferior pubic ramus fracture noted and  probable fracture at the ischiopubic junction. CT recommended for further evaluation and preoperative planning.  IMPRESSION:  1.  Protrusio and  rotation of the acetabular cup. 2.  Pathologic /stress fractures involving the obturator ring. Cannot exclude underlying particle disease or infection.  CT recommended for further evaluation.  Original Report Authenticated By: P. Loralie Champagne, M.D.   Medications: Scheduled Meds:    . amitriptyline  50 mg Oral QHS  . atorvastatin  80 mg Oral Daily  . digoxin  125 mcg Oral Daily  . feeding supplement  237 mL Oral TID BM  . gabapentin  300 mg Oral TID  . insulin aspart  0-9 Units Subcutaneous TID WC  . metoprolol succinate  100 mg Oral BID  . omega-3 acid ethyl esters  1 g Oral Q breakfast  . omeprazole  20 mg Oral Q1200  . piperacillin-tazobactam (ZOSYN)  IV  3.375 g Intravenous Q8H  . ramipril  10 mg Oral Daily  . sodium chloride  3 mL Intravenous Q12H  . vancomycin  750 mg Intravenous Q12H  . warfarin  3 mg Oral ONCE-1800  . Warfarin - Pharmacist Dosing Inpatient   Does not apply q1800   Continuous Infusions:    . sodium chloride 100 mL (10/26/11 1653)   PRN Meds:.acetaminophen, acetaminophen, morphine injection, ondansetron (ZOFRAN) IV, ondansetron, oxyCODONE-acetaminophen  ASSESSMENT & PLAN: Principal Problem:  *UTI (lower urinary tract infection) Active Problems:  DIABETES MELLITUS, TYPE II  CAD, ARTERY BYPASS GRAFT  Atrial fibrillation  Fever   UTI -Complicated, suprapubic catheter related UTI. -Catheter was change in the emergency department at the time of admission. -Was spiking fever on Zosyn, vancomycin added as patient had history of staph UTI. -Blood cultures obtained, repeat urine culture as the current culture showing multiple morphotypes. -Old culture being negative, continue IV antibiotics today likely discharge in the morning on Ceftin and Bactrim.  Chronic pain and weakness in the right hip -X-ray showed  protrusion and migration of the acetabular cup -Stress fracture of the obturator ring. -Orthopedics recommended to followup with him as outpatient, he'll likely need surgery.  Diabetes mellitus type 2 -Controlled with hemoglobin of 6.9. -Continue preadmission medication, utilize SSI and carb modified diet while in the hospital.  Atrial fibrillation -Rate controlled. -On chronic Coumadin, pharmacy is to dose of Coumadin.  History of prostate cancer -Patient had previous treatment. PSA is suppressed.    LOS: 4 days   [name] A 10/27/2011, 10:57 AM

## 2011-10-27 NOTE — Progress Notes (Signed)
[name] 78 y.o. 10/23/2011  * No surgery found *  * No surgery found *   Subjective Patient complaints:doing well without problems  Objective Neuro exam: grossly normal Vascular: peripheral pulses symmetrical Abdomen: abdomen is soft without significant tenderness, masses, organomegaly or guarding   Lab. Results: Basename   10/27/11  10/26/11             0540      0620      WBC        11.8*     12.4*     HGB        9.0*      9.4*      HCT        26.7*     28.5*     PLT        141*      135*      BMET Basename   10/27/11  10/26/11             0540      0620      NA         137       141       K          3.6       3.5       CL         106       110       CO2        22        23        GLUCOSE    147*      134*      BUN        14        16        CREATININE 0.98      1.10      CALCIUM    8.2*      8.2*       INR (no units)  Date                       Value       Range   Status  10/27/2011                   3.57*   Final  10/14/2011                     1.5    Final ---------- POC INR (no units)  Date                       Value       Range   Status  07/13/2009                     2.5    Final ---------- VITALS ---------------------------              10/27/11                     0420        ---------------------------  BP:          146/88        Pulse:         86          Temp:   98.4 F (36.9 C)  Resp:          20         ---------------------------  Dg Chest 2 View  10/23/2011  *RADIOLOGY REPORT*  Clinical Data: Fatigue.  Fever.  Altered level of consciousness.  CHEST - 2 VIEW  Comparison: 05/09/2009  Findings: Prior CABG noted.  Mild cardiomegaly is present with indistinct pulmonary vasculature and cephalization of blood flow suggesting pulmonary venous hypertension.  Overt edema is not observed.  Linear subsegmental atelectasis noted at the left lung base.  Atherosclerotic calcification of the aortic arch is noted.  No pleural effusion  is identified.  IMPRESSION:  1.  Cardiomegaly with pulmonary venous hypertension.  No overt edema. 2.  Atherosclerosis.  Original Report Authenticated By: Dellia Cloud, M.D.   Dg Hip Complete Right  10/24/2011  *RADIOLOGY REPORT*  Clinical Data: Right hip pain.  Remote hip surgery.  RIGHT HIP - COMPLETE 2+ VIEW  Comparison: 05/10/2009.  Findings: Interval protrusio of the right hip prosthesis with areas of lucency and sclerosis around the acetabular cup.  The acetabular cup has rotated into a more vertical position.  The femoral component is intact.  No findings for loosening or fracture.  There appear to be pathologic or stress fractures involving the obturator ring.  Inferior pubic ramus fracture noted and probable fracture at the ischiopubic junction. CT recommended for further evaluation and preoperative planning.  IMPRESSION:  1.  Protrusio and rotation of the acetabular cup. 2.  Pathologic /stress fractures involving the obturator ring. Cannot exclude underlying particle disease or infection.  CT recommended for further evaluation.  Original Report Authenticated By: P. Loralie Champagne, M.D.   Ct Hip Right Wo Contrast  10/24/2011  *RADIOLOGY REPORT*  Clinical Data: Right hip pain.  Obturator ring fracture.  CT OF THE RIGHT HIP WITHOUT CONTRAST  Technique:  Multidetector CT imaging was performed according to the standard protocol. Multiplanar CT image reconstructions were also generated.  Comparison: Radiographs dated 10/24/2011  Findings: There is a comminuted displaced fracture of the right acetabulum.  There is also a fragmented fracture of the medial aspect of the right inferior pubic ramus and a nondisplaced fracture of the superior pubic ramus adjacent to the acetabulum.  The acetabular component of the total hip prosthesis has migrated superiorly within the ilium.  The screws extend into the iliacus muscle.  There is what appears to be an old fracture of the right ilium at the posterior  inferior aspect of the sacroiliac joint. There is high density material within the inferior aspect of the acetabulum which may either represent cement or old hemorrhage. There is enlargement of the right obturator internus muscle probably due to hemorrhage secondary to the pubic rami fractures.  There is also some dystrophic calcification in the soft tissues posterior to the right superior pubic ramus which may represent dystrophic calcification in old hemorrhage.  The femoral component of the right total hip prosthesis appears in good position with no evidence of loosening.  Proximal femur is intact.  Suprapubic catheter is in place.  IMPRESSION: Comminuted fractures of the right acetabulum with a large defect in the medial wall.  Fractures of the right inferior and superior pubic rami.  Incompletely healed fracture of the posterior aspect of the right ilium adjacent to the inferior aspect of the right SI joint.  Original Report Authenticated By: Gwynn Burly, M.D.     Assessment/ Plan No change in clinical exam plan on outpatient f/u Dr Lequita Halt as an outpatient   [facility]  D 5/19/20139:50 AM

## 2011-10-27 NOTE — Progress Notes (Signed)
ANTIBIOTIC/ANTICOAGULATION CONSULT NOTE - FOLLOW UP  Pharmacy Consult for Vancomycin, Zosyn and Coumadin Indication: Complicated UTI; Afib  Allergies  Allergen Reactions  . Codeine Other (See Comments)    unknown  . Penicillins Other (See Comments)    unknown    Patient Measurements: Height: 5\' 9"  (175.3 cm) Weight: 155 lb 3.3 oz (70.4 kg) IBW/kg (Calculated) : 70.7  Adjusted Body Weight:   Vital Signs: Temp: 98.8 F (37.1 C) (05/19 1400) Temp src: Oral (05/19 1400) BP: 169/82 mmHg (05/19 1400) Pulse Rate: 83  (05/19 1400) Intake/Output from previous day: 05/18 0701 - 05/19 0700 In: 3330 [P.O.:180; I.V.:2600; IV Piggyback:550] Out: 1153 [Urine:1150; Stool:3] Intake/Output from this shift: Total I/O In: 290 [P.O.:240; IV Piggyback:50] Out: -   Labs:  Basename 10/27/11 0540 10/26/11 0620 10/25/11 0520  WBC 11.8* 12.4* 13.8*  HGB 9.0* 9.4* 10.6*  PLT 141* 135* 152  LABCREA -- -- --  CREATININE 0.98 1.10 0.99   Estimated Creatinine Clearance: 62.9 ml/min (by C-G formula based on Cr of 0.98). No results found for this basename: VANCOTROUGH:2,VANCOPEAK:2,VANCORANDOM:2,GENTTROUGH:2,GENTPEAK:2,GENTRANDOM:2,TOBRATROUGH:2,TOBRAPEAK:2,TOBRARND:2,AMIKACINPEAK:2,AMIKACINTROU:2,AMIKACIN:2, in the last 72 hours   Microbiology: Recent Results (from the past 720 hour(s))  CULTURE, BLOOD (ROUTINE X 2)     Status: Normal (Preliminary result)   Collection Time   10/23/11  8:49 PM      Component Value Range Status Comment   Specimen Description BLOOD HAND RIGHT   Final    Special Requests BOTTLES DRAWN AEROBIC ONLY 10CC   Final    Culture  Setup Time [id number]   Final    Culture     Final    Value:        BLOOD CULTURE RECEIVED NO GROWTH TO DATE CULTURE WILL BE HELD FOR 5 DAYS BEFORE ISSUING A FINAL NEGATIVE REPORT   Report Status PENDING   Incomplete   CULTURE, BLOOD (ROUTINE X 2)     Status: Normal (Preliminary result)   Collection Time   10/23/11  8:57 PM      Component  Value Range Status Comment   Specimen Description BLOOD HAND LEFT   Final    Special Requests BOTTLES DRAWN AEROBIC ONLY [facility]   Final    Culture  Setup Time [id number]   Final    Culture     Final    Value:        BLOOD CULTURE RECEIVED NO GROWTH TO DATE CULTURE WILL BE HELD FOR 5 DAYS BEFORE ISSUING A FINAL NEGATIVE REPORT   Report Status PENDING   Incomplete   GRAM STAIN     Status: Normal   Collection Time   10/23/11  9:36 PM      Component Value Range Status Comment   Specimen Description URINE, CATHETERIZED   Final    Special Requests NONE   Final    Gram Stain     Final    Value: CYTOSPIN SLIDE     WBC PRESENT,BOTH PMN AND MONONUCLEAR     GRAM NEGATIVE RODS   Report Status 10/23/2011 FINAL   Final   URINE CULTURE     Status: Normal   Collection Time   10/23/11  9:37 PM      Component Value Range Status Comment   Specimen Description URINE, CATHETERIZED   Final    Special Requests NONE   Final    Culture  Setup Time [id number]   Final    Colony Count >=100,000 COLONIES/ML   Final    Culture  Final    Value: Multiple bacterial morphotypes present, none predominant. Suggest appropriate recollection if clinically indicated.   Report Status 10/24/2011 FINAL   Final   MRSA PCR SCREENING     Status: Normal   Collection Time   10/24/11  3:00 AM      Component Value Range Status Comment   MRSA by PCR NEGATIVE  NEGATIVE  Final   URINE CULTURE     Status: Normal   Collection Time   10/25/11  7:33 PM      Component Value Range Status Comment   Specimen Description URINE, SUPRAPUBIC   Final    Special Requests NONE   Final    Culture  Setup Time [id number]   Final    Colony Count NO GROWTH   Final    Culture NO GROWTH   Final    Report Status 10/27/2011 FINAL   Final     Anti-infectives     Start     Dose/Rate Route Frequency Ordered Stop   10/24/11 2300   vancomycin (VANCOCIN) 750 mg in sodium chloride 0.9 % 150 mL IVPB        750 mg 150 mL/hr over 60 Minutes  Intravenous Every 12 hours 10/24/11 1218     10/24/11 1230   vancomycin (VANCOCIN) 750 mg in sodium chloride 0.9 % 150 mL IVPB        750 mg 150 mL/hr over 60 Minutes Intravenous  Once 10/24/11 1218 10/24/11 1522   10/24/11 0800  piperacillin-tazobactam (ZOSYN) IVPB 3.375 g       3.375 g 12.5 mL/hr over 240 Minutes Intravenous Every 8 hours 10/24/11 0244     10/24/11 0245  piperacillin-tazobactam (ZOSYN) IVPB 3.375 g       3.375 g 100 mL/hr over 30 Minutes Intravenous  Once 10/24/11 0244 10/24/11 0407   10/23/11 2300   cefTRIAXone (ROCEPHIN) 1 g in dextrose 5 % 50 mL IVPB        1 g 100 mL/hr over 30 Minutes Intravenous  Once 10/23/11 2247 10/23/11 2329          Assessment: 1. 79yom on Vancomycin and Zosyn Day 3 for complicated UTI. Patient is currently afebrile and WBC have slowly trended down. Patient's renal function has remained stable (Scr ~0.95). Per MD note, patient will most likely be discharge in the AM on Ceftin and Bactrim. Will not plan to check Vancomycin trough unless patient discharge is delayed.  2. Patient is also on Coumadin for Afib. INR (3.57) is now supratherapeutic after significantly increasing overnight most likely due to Coumadin 7.5mg  dose given on 5/17 and concomitant antibiotics. PTA Coumadin regimen: 3mg  daily.  - H/H trending down, Plts stable - No significant bleeding reported  Goal of Therapy:  Vancomycin trough level 10-15 mcg/ml INR 2-3  Plan:  1. Continue Vancomycin 750mg  IV q12h. Will check Vancomycin trough in 1-2 days only if discharge is cancelled/delayed 2. Continue Zosyn 3.375g IV q8h 3. No Coumadin tonight 4. Follow-up AM INR 5. Continue to monitor renal function, cultures and order levels when indicated  Cleon Dew [phone] 10/27/2011,4:00 PM

## 2011-10-28 ENCOUNTER — Ambulatory Visit: Payer: Medicare Other

## 2011-10-28 ENCOUNTER — Inpatient Hospital Stay (HOSPITAL_COMMUNITY): Payer: Medicare Other

## 2011-10-28 DIAGNOSIS — I4891 Unspecified atrial fibrillation: Secondary | ICD-10-CM

## 2011-10-28 DIAGNOSIS — S72009A Fracture of unspecified part of neck of unspecified femur, initial encounter for closed fracture: Secondary | ICD-10-CM

## 2011-10-28 DIAGNOSIS — E782 Mixed hyperlipidemia: Secondary | ICD-10-CM

## 2011-10-28 DIAGNOSIS — R509 Fever, unspecified: Secondary | ICD-10-CM

## 2011-10-28 LAB — BASIC METABOLIC PANEL
CO2: 21 mEq/L (ref 19–32)
Calcium: 8.3 mg/dL — ABNORMAL LOW (ref 8.4–10.5)
GFR calc non Af Amer: 82 mL/min — ABNORMAL LOW (ref >90)
Glucose, Bld: 156 mg/dL — ABNORMAL HIGH (ref 70–99)
Potassium: 3.4 mEq/L — ABNORMAL LOW (ref 3.5–5.1)
Sodium: 135 mEq/L (ref 135–145)

## 2011-10-28 LAB — CBC
Hemoglobin: 9.3 g/dL — ABNORMAL LOW (ref 13.0–17.0)
MCH: 32.5 pg (ref 26.0–34.0)
MCV: 93.7 fL (ref 78.0–100.0)
Platelets: 166 10*3/uL (ref 150–400)
RBC: 2.86 MIL/uL — ABNORMAL LOW (ref 4.22–5.81)
WBC: 13.4 10*3/uL — ABNORMAL HIGH (ref 4.0–10.5)

## 2011-10-28 LAB — GLUCOSE, CAPILLARY
Glucose-Capillary: 219 mg/dL — ABNORMAL HIGH (ref 70–99)
Glucose-Capillary: 165 mg/dL — ABNORMAL HIGH (ref 70–99)

## 2011-10-28 LAB — PROTIME-INR: INR: 2.82 — ABNORMAL HIGH (ref 0.00–1.49)

## 2011-10-28 LAB — VANCOMYCIN, TROUGH: Vancomycin Tr: 9.1 ug/mL — ABNORMAL LOW (ref 10.0–20.0)

## 2011-10-28 MED ORDER — SODIUM CHLORIDE 0.9 % IJ SOLN
10.0000 mL | INTRAMUSCULAR | Status: DC | PRN
  Administered 2011-10-29 – 2011-11-02 (×5): 10 mL

## 2011-10-28 MED ORDER — GUAIFENESIN ER 600 MG PO TB12
600.0000 mg | ORAL_TABLET | Freq: Two times a day (BID) | ORAL | Status: DC | PRN
  Filled 2011-10-28 (×2): qty 1

## 2011-10-28 MED ORDER — ALBUTEROL SULFATE (5 MG/ML) 0.5% IN NEBU: 2.5000 mg | INHALATION_SOLUTION | RESPIRATORY_TRACT | Status: DC | PRN

## 2011-10-28 MED ORDER — POTASSIUM CHLORIDE CRYS ER 20 MEQ PO TBCR
40.0000 meq | EXTENDED_RELEASE_TABLET | Freq: Four times a day (QID) | ORAL | Status: AC
  Administered 2011-10-28 (×2): 40 meq via ORAL
  Filled 2011-10-28 (×2): qty 2

## 2011-10-28 MED ORDER — WARFARIN SODIUM 3 MG PO TABS
3.0000 mg | ORAL_TABLET | Freq: Once | ORAL | Status: AC
  Administered 2011-10-28: 3 mg via ORAL
  Filled 2011-10-28: qty 1

## 2011-10-28 MED ORDER — VANCOMYCIN HCL IN DEXTROSE 1-5 GM/200ML-% IV SOLN
1000.0000 mg | Freq: Two times a day (BID) | INTRAVENOUS | Status: DC
  Administered 2011-10-28 – 2011-10-29 (×3): 1000 mg via INTRAVENOUS
  Filled 2011-10-28 (×6): qty 200

## 2011-10-28 NOTE — Progress Notes (Addendum)
Clinical Social Work Department CLINICAL SOCIAL WORK PLACEMENT NOTE 10/28/2011  Patient:  ELEK, HOLDERNESS  Account Number:  [id number] Admit date:  10/23/2011  Clinical Social Worker:  Jacelyn Grip  Date/time:  10/28/2011 10:45 AM  Clinical Social Work is seeking post-discharge placement for this patient at the following level of care:   SKILLED NURSING   (*CSW will update this form in Epic as items are completed)   10/28/2011  Patient/family provided with [facility] Department of Clinical Social Work's list of facilities offering this level of care within the geographic area requested by the patient (or if unable, by the patient's family).  10/28/2011  Patient/family informed of their freedom to choose among providers that offer the needed level of care, that participate in Medicare, Medicaid or managed care program needed by the patient, have an available bed and are willing to accept the patient.  10/28/2011  Patient/family informed of [facility]' ownership interest in [facility], as well as of the fact that they are under no obligation to receive care at this facility.  PASARR submitted to EDS on 10/28/2011 PASARR number received from [employer] on 10/28/2011- existing Pasarr  FL2 transmitted to all facilities in geographic area requested by pt/family on  10/28/2011 FL2 transmitted to all facilities within larger geographic area on   Patient informed that his/her managed care company has contracts with or will negotiate with  certain facilities, including the following:     Patient/family informed of bed offers received:  10/28/2011 Patient chooses bed at [facility] Physician recommends and patient chooses bed at    Patient to be transferred to  on  [facility] on 11/05/2011 Patient to be transferred to facility by ambulance  The following physician request were entered in Epic:   Additional Comments:   Jacklynn Lewis, MSW,  [facility]  Clinical Social Work [phone]

## 2011-10-28 NOTE — Progress Notes (Signed)
DAILY PROGRESS NOTE                              [facility]AL [facility]  SUBJECTIVE: Awake, alert and oriented. Still has low-grade fever of 99.4. Wife at bedside.  OBJECTIVE: BP 137/68  Pulse 84  Temp(Src) 99.4 F (37.4 C) (Oral)  Resp 18  Ht 5\' 9"  (1.753 m)  Wt 70.4 kg (155 lb 3.3 oz)  BMI 22.92 kg/m2  SpO2 97%  Intake/Output Summary (Last 24 hours) at 10/28/11 1130 Last data filed at 10/28/11 1055  Gross per 24 hour  Intake   1758 ml  Output   3203 ml  Net  -1445 ml                      Weight change:  Physical Exam: General: Alert, disoriented not in any acute distress. HEENT: anicteric sclera, pupils equal reactive to light and accommodation CVS: S1-S2 heard, no murmur rubs or gallops Chest: clear to auscultation bilaterally, no wheezing rales or rhonchi Abdomen:  normal bowel sounds, soft, nontender, nondistended, no organomegaly Neuro: Cranial nerves II-XII intact, no focal neurological deficits Extremities: no cyanosis, no clubbing or edema noted bilaterally   Lab Results:  Basename 10/28/11 0520 10/27/11 0540  NA 135 137  K 3.4* 3.6  CL 102 106  CO2 21 22  GLUCOSE 156* 147*  BUN 11 14  CREATININE 0.84 0.98  CALCIUM 8.3* 8.2*  MG -- --  PHOS -- --   No results found for this basename: AST:2,ALT:2,ALKPHOS:2,BILITOT:2,PROT:2,ALBUMIN:2 in the last 72 hours No results found for this basename: LIPASE:2,AMYLASE:2 in the last 72 hours  Basename 10/28/11 0520 10/27/11 0540  WBC 13.4* 11.8*  NEUTROABS -- --  HGB 9.3* 9.0*  HCT 26.8* 26.7*  MCV 93.7 95.7  PLT 166 141*   No results found for this basename: CKTOTAL:3,CKMB:3,CKMBINDEX:3,TROPONINI:3 in the last 72 hours No components found with this basename: POCBNP:3 No results found for this basename: DDIMER:2 in the last 72 hours No results found for this basename: HGBA1C:2 in the last 72 hours No results found for this basename: CHOL:2,HDL:2,LDLCALC:2,TRIG:2,CHOLHDL:2,LDLDIRECT:2 in  the last 72 hours No results found for this basename: TSH,T4TOTAL,FREET3,T3FREE,THYROIDAB in the last 72 hours No results found for this basename: VITAMINB12:2,FOLATE:2,FERRITIN:2,TIBC:2,IRON:2,RETICCTPCT:2 in the last 72 hours  Micro Results: Recent Results (from the past 240 hour(s))  CULTURE, BLOOD (ROUTINE X 2)     Status: Normal (Preliminary result)   Collection Time   10/23/11  8:49 PM      Component Value Range Status Comment   Specimen Description BLOOD HAND RIGHT   Final    Special Requests BOTTLES DRAWN AEROBIC ONLY 10CC   Final    Culture  Setup Time [id number]   Final    Culture     Final    Value:        BLOOD CULTURE RECEIVED NO GROWTH TO DATE CULTURE WILL BE HELD FOR 5 DAYS BEFORE ISSUING A FINAL NEGATIVE REPORT   Report Status PENDING   Incomplete   CULTURE, BLOOD (ROUTINE X 2)     Status: Normal (Preliminary result)   Collection Time   10/23/11  8:57 PM      Component Value Range Status Comment   Specimen Description BLOOD HAND LEFT   Final    Special Requests BOTTLES DRAWN AEROBIC ONLY [facility]   Final    Culture  Setup Time [id number]   Final  Culture     Final    Value:        BLOOD CULTURE RECEIVED NO GROWTH TO DATE CULTURE WILL BE HELD FOR 5 DAYS BEFORE ISSUING A FINAL NEGATIVE REPORT   Report Status PENDING   Incomplete   GRAM STAIN     Status: Normal   Collection Time   10/23/11  9:36 PM      Component Value Range Status Comment   Specimen Description URINE, CATHETERIZED   Final    Special Requests NONE   Final    Gram Stain     Final    Value: CYTOSPIN SLIDE     WBC PRESENT,BOTH PMN AND MONONUCLEAR     GRAM NEGATIVE RODS   Report Status 10/23/2011 FINAL   Final   URINE CULTURE     Status: Normal   Collection Time   10/23/11  9:37 PM      Component Value Range Status Comment   Specimen Description URINE, CATHETERIZED   Final    Special Requests NONE   Final    Culture  Setup Time [id number]   Final    Colony Count >=100,000 COLONIES/ML   Final     Culture     Final    Value: Multiple bacterial morphotypes present, none predominant. Suggest appropriate recollection if clinically indicated.   Report Status 10/24/2011 FINAL   Final   MRSA PCR SCREENING     Status: Normal   Collection Time   10/24/11  3:00 AM      Component Value Range Status Comment   MRSA by PCR NEGATIVE  NEGATIVE  Final   URINE CULTURE     Status: Normal   Collection Time   10/25/11  7:33 PM      Component Value Range Status Comment   Specimen Description URINE, SUPRAPUBIC   Final    Special Requests NONE   Final    Culture  Setup Time [id number]   Final    Colony Count NO GROWTH   Final    Culture NO GROWTH   Final    Report Status 10/27/2011 FINAL   Final     Studies/Results: Dg Chest 2 View  10/23/2011  *RADIOLOGY REPORT*  Clinical Data: Fatigue.  Fever.  Altered level of consciousness.  CHEST - 2 VIEW  Comparison: 05/09/2009  Findings: Prior CABG noted.  Mild cardiomegaly is present with indistinct pulmonary vasculature and cephalization of blood flow suggesting pulmonary venous hypertension.  Overt edema is not observed.  Linear subsegmental atelectasis noted at the left lung base.  Atherosclerotic calcification of the aortic arch is noted.  No pleural effusion is identified.  IMPRESSION:  1.  Cardiomegaly with pulmonary venous hypertension.  No overt edema. 2.  Atherosclerosis.  Original Report Authenticated By: Dellia Cloud, M.D.   Dg Hip Complete Right  10/24/2011  *RADIOLOGY REPORT*  Clinical Data: Right hip pain.  Remote hip surgery.  RIGHT HIP - COMPLETE 2+ VIEW  Comparison: 05/10/2009.  Findings: Interval protrusio of the right hip prosthesis with areas of lucency and sclerosis around the acetabular cup.  The acetabular cup has rotated into a more vertical position.  The femoral component is intact.  No findings for loosening or fracture.  There appear to be pathologic or stress fractures involving the obturator ring.  Inferior pubic ramus fracture  noted and probable fracture at the ischiopubic junction. CT recommended for further evaluation and preoperative planning.  IMPRESSION:  1.  Protrusio and rotation of the acetabular  cup. 2.  Pathologic /stress fractures involving the obturator ring. Cannot exclude underlying particle disease or infection.  CT recommended for further evaluation.  Original Report Authenticated By: P. Loralie Champagne, M.D.   Medications: Scheduled Meds:    . amitriptyline  50 mg Oral QHS  . atorvastatin  80 mg Oral Daily  . digoxin  125 mcg Oral Daily  . feeding supplement  237 mL Oral TID BM  . gabapentin  300 mg Oral TID  . insulin aspart  0-9 Units Subcutaneous TID WC  . metoprolol succinate  100 mg Oral BID  . omega-3 acid ethyl esters  1 g Oral Q breakfast  . omeprazole  20 mg Oral Q1200  . piperacillin-tazobactam (ZOSYN)  IV  3.375 g Intravenous Q8H  . potassium chloride  40 mEq Oral Once  . ramipril  10 mg Oral Daily  . sodium chloride  3 mL Intravenous Q12H  . vancomycin  750 mg Intravenous Q12H  . warfarin  3 mg Oral ONCE-1800  . Warfarin - Pharmacist Dosing Inpatient   Does not apply q1800   Continuous Infusions:    . sodium chloride 100 mL (10/26/11 1653)   PRN Meds:.acetaminophen, acetaminophen, morphine injection, ondansetron (ZOFRAN) IV, ondansetron, oxyCODONE-acetaminophen  ASSESSMENT & PLAN: Principal Problem:  *UTI (lower urinary tract infection) Active Problems:  DIABETES MELLITUS, TYPE II  CAD, ARTERY BYPASS GRAFT  Atrial fibrillation  Fever   UTI -Complicated, suprapubic catheter related UTI. -Catheter was changed in the emergency department at the time of admission. -Was spiking fever on Zosyn, vancomycin added as patient had history of staph UTI. -Blood cultures obtained, repeat urine culture as the current culture showing multiple morphotypes. -Still have low-grade fever after 4 days of IV antibiotics, likely discharge home on IV antibiotics. Place PICC line.  Chronic  pain and weakness in the right hip -X-ray showed protrusion and migration of the acetabular cup -Stress fracture of the obturator ring. -Orthopedics recommended to followup with him as outpatient, he'll likely need surgery.  Diabetes mellitus type 2 -Controlled with hemoglobin of 6.9. -Continue preadmission medication, utilize SSI and carb modified diet while in the hospital.  Atrial fibrillation -Rate controlled. -On chronic Coumadin, pharmacy is to dose of Coumadin.  History of prostate cancer -Patient had previous treatment. PSA is suppressed.  Disposition -Rediscussed with the patient and his wife. They're not rested and skilled nursing facility. -CSW notified. Will place PICC line anticipating discharge in the morning.   LOS: 5 days   Meztli Llanas A 10/28/2011, 11:30 AM

## 2011-10-28 NOTE — Progress Notes (Signed)
Physical Therapy Treatment Patient Details Name: [name] MRN: [id number] DOB: Nov 05, 1931 Today's Date: 10/28/2011 Time: 1308-6578 PT Time Calculation (min): 33 min  PT Assessment / Plan / Recommendation Comments on Treatment Session  Pt adm with UTI and found to have rt hip fx.  Pt made NWB on RLE.  Today pt again had lean to rt side in sitting even in recliner.  MD aware.    Follow Up Recommendations  Skilled nursing facility    Barriers to Discharge        Equipment Recommendations  Defer to next venue    Recommendations for Other Services    Frequency     Plan Frequency remains appropriate;Discharge plan remains appropriate    Precautions / Restrictions Precautions Precautions: Fall Restrictions RLE Weight Bearing: Non weight bearing   Pertinent Vitals/Pain Rt hip pain with movement.    Mobility  Bed Mobility Rolling Right: 1: +2 Total assist Rolling Right: Patient Percentage: 20% Rolling Left: 1: +2 Total assist Rolling Left: Patient Percentage: 20% Supine to Sit: 1: +2 Total assist Supine to Sit: Patient Percentage: 20% Sitting - Scoot to Edge of Bed: 1: +2 Total assist Sitting - Scoot to Edge of Bed: Patient Percentage: 20% Sit to Supine: 1: +2 Total assist Sit to Supine: Patient Percentage: 20% Details for Bed Mobility Assistance: Very little assistance from pt especially with trunk. Transfers Transfer via Lift Equipment: Maximove Details for Transfer Assistance: Pt tolerated maximove to chair very well.    Exercises     PT Diagnosis:    PT Problem List:   PT Treatment Interventions:     PT Goals Acute Rehab PT Goals PT Goal: Supine/Side to Sit - Progress: Not progressing PT Goal: Sit at Edge Of Bed - Progress: Not progressing PT Goal: Sit to Supine/Side - Progress: Not progressing PT Transfer Goal: Bed to Chair/Chair to Bed - Progress: Not progressing PT Goal: Propel Wheelchair - Progress: Not progressing  Visit Information  Last PT  Received On: 10/28/11 Assistance Needed: +2    Subjective Data  Subjective: Pt agreeable to using lift for OOB.   Cognition  Overall Cognitive Status: Appears within functional limits for tasks assessed/performed Area of Impairment: Awareness of deficits Arousal/Alertness: Awake/alert Current Attention Level: Sustained Following Commands: Follows one step commands with increased time Awareness of Deficits: Pt leaning heavily rt in sitting and not aware of severity of lean.    Balance  Static Sitting Balance Static Sitting - Balance Support: Bilateral upper extremity supported Static Sitting - Level of Assistance: 1: +2 Total assist Static Sitting - Comment/# of Minutes: Sat EOB x 5-6 minutes with heavy lean to rt.  With cuing pt could prop on lt elbow and briefly only require min assist but then resumed lean to rt.  End of Session PT - End of Session Activity Tolerance: Other (comment) (Pt with severe lean to rt.) Patient left: in chair;with family/visitor present Nurse Communication: Need for lift equipment    Brittanni Cariker 10/28/2011, 12:58 PM  [facility] PT [phone]

## 2011-10-28 NOTE — Progress Notes (Signed)
Clinical Social Worker provided pt and pt wife bed offers at bedside. Clinical Social Worker to follow up in the morning for decision for SNF placement. Clinical Social Worker to facilitate pt discharge needs when pt medically stable for discharge and bed available at SNF.   Jacklynn Lewis, MSW, LCSWA  Clinical Social Work [phone]

## 2011-10-28 NOTE — Progress Notes (Signed)
0630 Pt continuing to have incontinent loose stools, scrotum and buttocks red, epc applied. Requires 2 person to turn and clean.  Spoke with patient's wife who states he falls about once a month at home and has been having hip pain for weeks.  Will continue to monitor skin.  May be a good candidate for bed overlay mattress. [name]

## 2011-10-28 NOTE — Clinical Social Work Psychosocial (Signed)
     Clinical Social Work Department BRIEF PSYCHOSOCIAL ASSESSMENT 10/28/2011  Patient:  [name], [name]     Account Number:  [id number]     Admit date:  10/23/2011  Clinical Social Worker:  [name]  Date/Time:  10/28/2011 10:00 AM  Referred by:  Physician  Date Referred:  10/28/2011 Referred for  SNF Placement   Other Referral:   Interview type:  Patient Other interview type:   and patient wife    PSYCHOSOCIAL DATA Living Status:  WIFE Admitted from facility:   Level of care:   Primary support name:  [name]/[phone] Primary support relationship to patient:  SPOUSE Degree of support available:   strong    CURRENT CONCERNS Current Concerns  Post-Acute Placement   Other Concerns:    SOCIAL WORK ASSESSMENT / PLAN CSW met with pt and pt wife at bedside to discuss pt discharge needs. CSW discussed recommendations for SNF for rehab at discharge. Pt wife discussed that pt and pt wife were initially hesitant about SNF placement, but pt and pt wife recognize that pt wife will not be able to care for pt at home. CSW discussed process of SNF placement and pt and pt wife agreeable to SNF search in [place]. CSW completed FL2 and initiated SNF search in [place]. CSW to follow up with pt and pt wife in regard to bed offers. CSW to facilitate pt discharge needs when pt medically stable for discharge.   Assessment/plan status:  Psychosocial Support/Ongoing Assessment of Needs Other assessment/ plan:   discharge planning   Information/referral to community resources:   [facility] list    PATIENTS/FAMILYS RESPONSE TO PLAN OF CARE: Pt alert but with periods of confusion during conversation. Pt wife actively involved and supportive of pt care. Pt wife discussed that pt and pt wife live in [place] but prefer [facility] as pt has children that live in the area.

## 2011-10-28 NOTE — Progress Notes (Signed)
10/28/11  1840  Pt. With rhonchi and wheezing; wife concerned about the way pt. sounds;Dr. Arthor Captain texted.  [name] Zakkary Thibault,RN

## 2011-10-28 NOTE — Progress Notes (Signed)
ANTICOAGULATION CONSULT NOTE - Follow Up Consult  Pharmacy Consult for coumadin Indication: atrial fibrillation  Vital Signs: Temp: 99.4 F (37.4 C) (05/19 2148) Temp src: Oral (05/19 2148) BP: 137/68 mmHg (05/19 2148) Pulse Rate: 84  (05/19 2148)  Labs:  Basename 10/28/11 0520 10/27/11 0540 10/26/11 0620  HGB 9.3* 9.0* --  HCT 26.8* 26.7* 28.5*  PLT 166 141* 135*  APTT -- -- --  LABPROT 30.1* 36.2* 24.2*  INR 2.82* 3.57* 2.13*  HEPARINUNFRC -- -- --  CREATININE 0.84 0.98 1.10  CKTOTAL -- -- --  CKMB -- -- --  TROPONINI -- -- --    Estimated Creatinine Clearance: 73.3 ml/min (by C-G formula based on Cr of 0.84).  Assessment: 79 yom on coumadin for afib. INR today is at goal at 2.82. INR was elevated yesterday, likely due to 7.5mg  dose given several days ago. CBC is stable. No bleeding noted. Will give regular home dose today.  Goal of Therapy:  INR 2-3   Plan:  1. Coumadin 3mg  PO x 1 tonight 2. F/u AM INR if pt still admitted 3. If discharged, would resume previous home dose of 3mg  daily with INR check in 2-3 days  Andreas Sobolewski, Drake Leach 10/28/2011,8:51 AM

## 2011-10-28 NOTE — Progress Notes (Signed)
ANTIBIOTIC CONSULT NOTE - FOLLOW UP  Pharmacy Consult for vancomycin  Indication: complicated UTI  Intake/Output from previous day: 05/19 0701 - 05/20 0700 In: 1930 [P.O.:480; I.V.:1200; IV Piggyback:250] Out: 1501 [Urine:1500; Stool:1] Intake/Output from this shift: Total I/O In: 118 [P.O.:118] Out: 1702 [Urine:1700; Stool:2]  Labs:  [facility] 10/28/11 0520 10/27/11 0540 10/26/11 0620  WBC 13.4* 11.8* 12.4*  HGB 9.3* 9.0* 9.4*  PLT 166 141* 135*  LABCREA -- -- --  CREATININE 0.84 0.98 1.10   Estimated Creatinine Clearance: 73.3 ml/min (by C-G formula based on Cr of 0.84).  Basename 10/28/11 1031  VANCOTROUGH 9.1*  VANCOPEAK --  Drue Dun --  GENTTROUGH --  GENTPEAK --  GENTRANDOM --  TOBRATROUGH --  TOBRAPEAK --  TOBRARND --  AMIKACINPEAK --  AMIKACINTROU --  AMIKACIN --     Microbiology: Recent Results (from the past 720 hour(s))  CULTURE, BLOOD (ROUTINE X 2)     Status: Normal (Preliminary result)   Collection Time   10/23/11  8:49 PM      Component Value Range Status Comment   Specimen Description BLOOD HAND RIGHT   Final    Special Requests BOTTLES DRAWN AEROBIC ONLY 10CC   Final    Culture  Setup Time [id number]   Final    Culture     Final    Value:        BLOOD CULTURE RECEIVED NO GROWTH TO DATE CULTURE WILL BE HELD FOR 5 DAYS BEFORE ISSUING A FINAL NEGATIVE REPORT   Report Status PENDING   Incomplete   CULTURE, BLOOD (ROUTINE X 2)     Status: Normal (Preliminary result)   Collection Time   10/23/11  8:57 PM      Component Value Range Status Comment   Specimen Description BLOOD HAND LEFT   Final    Special Requests BOTTLES DRAWN AEROBIC ONLY [facility]   Final    Culture  Setup Time [id number]   Final    Culture     Final    Value:        BLOOD CULTURE RECEIVED NO GROWTH TO DATE CULTURE WILL BE HELD FOR 5 DAYS BEFORE ISSUING A FINAL NEGATIVE REPORT   Report Status PENDING   Incomplete   GRAM STAIN     Status: Normal   Collection Time   10/23/11  9:36  PM      Component Value Range Status Comment   Specimen Description URINE, CATHETERIZED   Final    Special Requests NONE   Final    Gram Stain     Final    Value: CYTOSPIN SLIDE     WBC PRESENT,BOTH PMN AND MONONUCLEAR     GRAM NEGATIVE RODS   Report Status 10/23/2011 FINAL   Final   URINE CULTURE     Status: Normal   Collection Time   10/23/11  9:37 PM      Component Value Range Status Comment   Specimen Description URINE, CATHETERIZED   Final    Special Requests NONE   Final    Culture  Setup Time [id number]   Final    Colony Count >=100,000 COLONIES/ML   Final    Culture     Final    Value: Multiple bacterial morphotypes present, none predominant. Suggest appropriate recollection if clinically indicated.   Report Status 10/24/2011 FINAL   Final   MRSA PCR SCREENING     Status: Normal   Collection Time   10/24/11  3:00 AM  Component Value Range Status Comment   MRSA by PCR NEGATIVE  NEGATIVE  Final   URINE CULTURE     Status: Normal   Collection Time   10/25/11  7:33 PM      Component Value Range Status Comment   Specimen Description URINE, SUPRAPUBIC   Final    Special Requests NONE   Final    Culture  Setup Time [id number]   Final    Colony Count NO GROWTH   Final    Culture NO GROWTH   Final    Report Status 10/27/2011 FINAL   Final     Anti-infectives     Start     Dose/Rate Route Frequency Ordered Stop   10/28/11 1200   vancomycin (VANCOCIN) IVPB 1000 mg/200 mL premix        1,000 mg 200 mL/hr over 60 Minutes Intravenous Every 12 hours 10/28/11 1140     10/24/11 2300   vancomycin (VANCOCIN) 750 mg in sodium chloride 0.9 % 150 mL IVPB  Status:  Discontinued        750 mg 150 mL/hr over 60 Minutes Intravenous Every 12 hours 10/24/11 1218 10/28/11 1139   10/24/11 1230   vancomycin (VANCOCIN) 750 mg in sodium chloride 0.9 % 150 mL IVPB        750 mg 150 mL/hr over 60 Minutes Intravenous  Once 10/24/11 1218 10/24/11 1522   10/24/11 0800   piperacillin-tazobactam (ZOSYN) IVPB 3.375 g       3.375 g 12.5 mL/hr over 240 Minutes Intravenous Every 8 hours 10/24/11 0244     10/24/11 0245  piperacillin-tazobactam (ZOSYN) IVPB 3.375 g       3.375 g 100 mL/hr over 30 Minutes Intravenous  Once 10/24/11 0244 10/24/11 0407   10/23/11 2300   cefTRIAXone (ROCEPHIN) 1 g in dextrose 5 % 50 mL IVPB        1 g 100 mL/hr over 30 Minutes Intravenous  Once 10/23/11 2247 10/23/11 2329          Assessment: [name] started on empiric vancomycin + zosyn for UTI. Today is day # 4 of therapy and pt continues to spike low-grade fevers. WBC is elevated at 13.4. Vancomycin trough today is slightly low at 9.1. Likely discharge on IV antibiotics.   Goal of Therapy:  Vancomycin trough level 10-15 mcg/ml  Plan:  1. Change vancomycin to 1gm IV Q12H 2. Continue zosyn 3.375gm IV Q8H 3. F/u discharge plan  Monico Sudduth, Drake Leach 10/28/2011,11:41 AM

## 2011-10-29 DIAGNOSIS — E782 Mixed hyperlipidemia: Secondary | ICD-10-CM

## 2011-10-29 DIAGNOSIS — S72009A Fracture of unspecified part of neck of unspecified femur, initial encounter for closed fracture: Secondary | ICD-10-CM

## 2011-10-29 DIAGNOSIS — I4891 Unspecified atrial fibrillation: Secondary | ICD-10-CM

## 2011-10-29 DIAGNOSIS — N390 Urinary tract infection, site not specified: Secondary | ICD-10-CM

## 2011-10-29 DIAGNOSIS — R509 Fever, unspecified: Secondary | ICD-10-CM

## 2011-10-29 LAB — CBC
MCH: 32 pg (ref 26.0–34.0)
MCV: 94.9 fL (ref 78.0–100.0)
Platelets: 193 10*3/uL (ref 150–400)
RBC: 2.94 MIL/uL — ABNORMAL LOW (ref 4.22–5.81)

## 2011-10-29 LAB — GLUCOSE, CAPILLARY

## 2011-10-29 LAB — BASIC METABOLIC PANEL
CO2: 24 mEq/L (ref 19–32)
Calcium: 8.3 mg/dL — ABNORMAL LOW (ref 8.4–10.5)
Glucose, Bld: 137 mg/dL — ABNORMAL HIGH (ref 70–99)
Sodium: 135 mEq/L (ref 135–145)

## 2011-10-29 MED ORDER — POTASSIUM CHLORIDE CRYS ER 20 MEQ PO TBCR
EXTENDED_RELEASE_TABLET | ORAL | Status: AC
  Administered 2011-10-29: 10:00:00
  Filled 2011-10-29: qty 2

## 2011-10-29 MED ORDER — FUROSEMIDE 10 MG/ML IJ SOLN
20.0000 mg | Freq: Once | INTRAMUSCULAR | Status: AC
  Administered 2011-10-29: 20 mg via INTRAVENOUS
  Filled 2011-10-29: qty 2

## 2011-10-29 MED ORDER — LEVOFLOXACIN 500 MG PO TABS
500.0000 mg | ORAL_TABLET | ORAL | Status: DC
  Administered 2011-10-29 – 2011-10-30 (×2): 500 mg via ORAL
  Filled 2011-10-29 (×3): qty 1

## 2011-10-29 MED ORDER — FUROSEMIDE 10 MG/ML IJ SOLN
40.0000 mg | Freq: Once | INTRAMUSCULAR | Status: AC
  Administered 2011-10-29: 40 mg via INTRAVENOUS
  Filled 2011-10-29: qty 4

## 2011-10-29 MED ORDER — POTASSIUM CHLORIDE CRYS ER 20 MEQ PO TBCR
40.0000 meq | EXTENDED_RELEASE_TABLET | Freq: Once | ORAL | Status: AC
  Administered 2011-10-29: 40 meq via ORAL

## 2011-10-29 MED ORDER — POTASSIUM CHLORIDE CRYS ER 20 MEQ PO TBCR
EXTENDED_RELEASE_TABLET | ORAL | Status: AC
  Administered 2011-10-29: 20 meq
  Filled 2011-10-29: qty 1

## 2011-10-29 NOTE — Progress Notes (Signed)
Patient has audible wheezing; oxygen saturation 99% on 2L oxygen via nasal cannula.  Patient denies difficulty breathing; breathing is unlabored.  Patient temperature 102.8 F oral.  Maren Reamer, NP notified; new orders received for mucinex PRN and albuterol PRN.  Will continue to monitor.

## 2011-10-29 NOTE — Progress Notes (Signed)
DAILY PROGRESS NOTE                              [facility]  SUBJECTIVE: Awake, alert and oriented. Spike of fever of 102.8, he is on 2 antibiotics (Zosyn and vancomycin) Wife at bedside.  OBJECTIVE: BP 159/65  Pulse 96  Temp(Src) 100.4 F (38 C) (Oral)  Resp 18  Ht 5\' 9"  (1.753 m)  Wt 70.4 kg (155 lb 3.3 oz)  BMI 22.92 kg/m2  SpO2 98%  Intake/Output Summary (Last 24 hours) at 10/29/11 1047 Last data filed at 10/29/11 1034  Gross per 24 hour  Intake   2533 ml  Output   5251 ml  Net  -2718 ml                      Weight change:  Physical Exam: General: Alert, disoriented not in any acute distress. HEENT: anicteric sclera, pupils equal reactive to light and accommodation CVS: S1-S2 heard, no murmur rubs or gallops Chest: clear to auscultation bilaterally, no wheezing rales or rhonchi Abdomen:  normal bowel sounds, soft, nontender, nondistended, no organomegaly Neuro: Cranial nerves II-XII intact, no focal neurological deficits Extremities: no cyanosis, no clubbing or edema noted bilaterally   Lab Results:  Basename 10/29/11 0550 10/28/11 0520  NA 135 135  K 3.5 3.4*  CL 102 102  CO2 24 21  GLUCOSE 137* 156*  BUN 12 11  CREATININE 0.92 0.84  CALCIUM 8.3* 8.3*  MG -- --  PHOS -- --   No results found for this basename: AST:2,ALT:2,ALKPHOS:2,BILITOT:2,PROT:2,ALBUMIN:2 in the last 72 hours No results found for this basename: LIPASE:2,AMYLASE:2 in the last 72 hours  Basename 10/29/11 0550 10/28/11 0520  WBC 11.7* 13.4*  NEUTROABS -- --  HGB 9.4* 9.3*  HCT 27.9* 26.8*  MCV 94.9 93.7  PLT 193 166   No results found for this basename: CKTOTAL:3,CKMB:3,CKMBINDEX:3,TROPONINI:3 in the last 72 hours No components found with this basename: POCBNP:3 No results found for this basename: DDIMER:2 in the last 72 hours No results found for this basename: HGBA1C:2 in the last 72 hours No results found for this basename:  CHOL:2,HDL:2,LDLCALC:2,TRIG:2,CHOLHDL:2,LDLDIRECT:2 in the last 72 hours No results found for this basename: TSH,T4TOTAL,FREET3,T3FREE,THYROIDAB in the last 72 hours No results found for this basename: VITAMINB12:2,FOLATE:2,FERRITIN:2,TIBC:2,IRON:2,RETICCTPCT:2 in the last 72 hours  Micro Results: Recent Results (from the past 240 hour(s))  CULTURE, BLOOD (ROUTINE X 2)     Status: Normal (Preliminary result)   Collection Time   10/23/11  8:49 PM      Component Value Range Status Comment   Specimen Description BLOOD HAND RIGHT   Final    Special Requests BOTTLES DRAWN AEROBIC ONLY 10CC   Final    Culture  Setup Time [id number]   Final    Culture     Final    Value:        BLOOD CULTURE RECEIVED NO GROWTH TO DATE CULTURE WILL BE HELD FOR 5 DAYS BEFORE ISSUING A FINAL NEGATIVE REPORT   Report Status PENDING   Incomplete   CULTURE, BLOOD (ROUTINE X 2)     Status: Normal (Preliminary result)   Collection Time   10/23/11  8:57 PM      Component Value Range Status Comment   Specimen Description BLOOD HAND LEFT   Final    Special Requests BOTTLES DRAWN AEROBIC ONLY 6CC   Final    Culture  Setup Time [id number]   Final    Culture     Final    Value:        BLOOD CULTURE RECEIVED NO GROWTH TO DATE CULTURE WILL BE HELD FOR 5 DAYS BEFORE ISSUING A FINAL NEGATIVE REPORT   Report Status PENDING   Incomplete   GRAM STAIN     Status: Normal   Collection Time   10/23/11  9:36 PM      Component Value Range Status Comment   Specimen Description URINE, CATHETERIZED   Final    Special Requests NONE   Final    Gram Stain     Final    Value: CYTOSPIN SLIDE     WBC PRESENT,BOTH PMN AND MONONUCLEAR     GRAM NEGATIVE RODS   Report Status 10/23/2011 FINAL   Final   URINE CULTURE     Status: Normal   Collection Time   10/23/11  9:37 PM      Component Value Range Status Comment   Specimen Description URINE, CATHETERIZED   Final    Special Requests NONE   Final    Culture  Setup Time [id number]    Final    Colony Count >=100,000 COLONIES/ML   Final    Culture     Final    Value: Multiple bacterial morphotypes present, none predominant. Suggest appropriate recollection if clinically indicated.   Report Status 10/24/2011 FINAL   Final   MRSA PCR SCREENING     Status: Normal   Collection Time   10/24/11  3:00 AM      Component Value Range Status Comment   MRSA by PCR NEGATIVE  NEGATIVE  Final   URINE CULTURE     Status: Normal   Collection Time   10/25/11  7:33 PM      Component Value Range Status Comment   Specimen Description URINE, SUPRAPUBIC   Final    Special Requests NONE   Final    Culture  Setup Time [id number]   Final    Colony Count NO GROWTH   Final    Culture NO GROWTH   Final    Report Status 10/27/2011 FINAL   Final     Studies/Results: Dg Chest 2 View  10/23/2011  *RADIOLOGY REPORT*  Clinical Data: Fatigue.  Fever.  Altered level of consciousness.  CHEST - 2 VIEW  Comparison: 05/09/2009  Findings: Prior CABG noted.  Mild cardiomegaly is present with indistinct pulmonary vasculature and cephalization of blood flow suggesting pulmonary venous hypertension.  Overt edema is not observed.  Linear subsegmental atelectasis noted at the left lung base.  Atherosclerotic calcification of the aortic arch is noted.  No pleural effusion is identified.  IMPRESSION:  1.  Cardiomegaly with pulmonary venous hypertension.  No overt edema. 2.  Atherosclerosis.  Original Report Authenticated By: Dellia Cloud, M.D.   Dg Hip Complete Right  10/24/2011  *RADIOLOGY REPORT*  Clinical Data: Right hip pain.  Remote hip surgery.  RIGHT HIP - COMPLETE 2+ VIEW  Comparison: 05/10/2009.  Findings: Interval protrusio of the right hip prosthesis with areas of lucency and sclerosis around the acetabular cup.  The acetabular cup has rotated into a more vertical position.  The femoral component is intact.  No findings for loosening or fracture.  There appear to be pathologic or stress fractures  involving the obturator ring.  Inferior pubic ramus fracture noted and probable fracture at the ischiopubic junction. CT recommended for further evaluation and preoperative planning.  IMPRESSION:  1.  Protrusio and rotation of the acetabular cup. 2.  Pathologic /stress fractures involving the obturator ring. Cannot exclude underlying particle disease or infection.  CT recommended for further evaluation.  Original Report Authenticated By: P. Loralie Champagne, M.D.   Medications: Scheduled Meds:    . amitriptyline  50 mg Oral QHS  . atorvastatin  80 mg Oral Daily  . digoxin  125 mcg Oral Daily  . feeding supplement  237 mL Oral TID BM  . furosemide  20 mg Intravenous Once  . furosemide  40 mg Intravenous Once  . gabapentin  300 mg Oral TID  . insulin aspart  0-9 Units Subcutaneous TID WC  . metoprolol succinate  100 mg Oral BID  . omega-3 acid ethyl esters  1 g Oral Q breakfast  . omeprazole  20 mg Oral Q1200  . piperacillin-tazobactam (ZOSYN)  IV  3.375 g Intravenous Q8H  . potassium chloride SA      . potassium chloride SA      . potassium chloride  40 mEq Oral Q6H  . potassium chloride  40 mEq Oral Once  . ramipril  10 mg Oral Daily  . sodium chloride  3 mL Intravenous Q12H  . vancomycin  1,000 mg Intravenous Q12H  . warfarin  3 mg Oral ONCE-1800  . Warfarin - Pharmacist Dosing Inpatient   Does not apply q1800  . DISCONTD: vancomycin  750 mg Intravenous Q12H   Continuous Infusions:    . sodium chloride 100 mL (10/26/11 1653)   PRN Meds:.acetaminophen, acetaminophen, albuterol, guaiFENesin, morphine injection, ondansetron (ZOFRAN) IV, ondansetron, oxyCODONE-acetaminophen, sodium chloride  ASSESSMENT & PLAN: Principal Problem:  *UTI (lower urinary tract infection) Active Problems:  DIABETES MELLITUS, TYPE II  CAD, ARTERY BYPASS GRAFT  Atrial fibrillation  Fever  Interval history 78 year old Caucasian male with past medical history of diabetes, prostate cancer status post  indwelling suprapubic catheter. Patient admitted to the hospital with UTI. Patient initially started on Zosyn after the urine and blood cultures obtained. Patient was still spiking fever after 24 hours and vancomycin was added after wife reported that he had history of staph UTI. Patient also has right hip pain turned out to be periprosthetic fracture. Patient was seen by orthopedics and they want to evaluate him after the infection cleared for surgery, this will be done as outpatient. Patient still spiking fever on Zosyn and vancomycin after his fever curve was trending down, had mild fluid overload yesterday treated with Lasix, and possible aspiration as he is having cough while he is eating. I will ask infectious disease to see. SLP to evaluate his swallowing.  UTI -Complicated, suprapubic catheter related UTI. -Catheter was changed in the emergency department at the time of admission. -Was spiking fever on Zosyn, vancomycin added as patient had history of staph UTI. -Blood cultures obtained, repeat urine culture as the current culture showing multiple morphotypes. -Still have low-grade fever after 5 days of IV antibiotics, ask ID to see him.  Chronic pain and weakness in the right hip -X-ray showed protrusion and migration of the acetabular cup -Stress fracture of the obturator ring. -Orthopedics recommended to followup with him as outpatient, he'll likely need surgery.  Fluid overload -Mild pulmonary edema, IV fluids stopped. -Patient started on IV Lasix, had over 5 L urine output yesterday. I will repeat the Lasix dose today. -Patient feels much better, shortness of breath almost resolved.  Diabetes mellitus type 2 -Controlled with hemoglobin of 6.9. -Continue preadmission medication, utilize SSI and carb modified  diet while in the hospital.  Atrial fibrillation -Rate controlled. -On chronic Coumadin, pharmacy is to dose of Coumadin.  History of prostate cancer -Patient had previous  treatment. PSA is suppressed.  Disposition -Rediscussed with the patient and his wife. He is to go to skilled nursing facility when medically ready. -Pending infectious disease consultation for antibiotic recommendations. -Likely to be discharged in 1-2 days.   LOS: 6 days   Rochelle Larue A 10/29/2011, 10:47 AM

## 2011-10-29 NOTE — Progress Notes (Signed)
Results received from chest x-ray verifying PICC placement.  Notified Maren Reamer, NP of results of xray.  New order received for lasix.  Will continue to monitor.

## 2011-10-29 NOTE — Progress Notes (Signed)
Physical Therapy Treatment Patient Details Name: [name] MRN: [id number] DOB: [birth year] Today's Date: 10/29/2011 Time: 4098-1191 PT Time Calculation (min): 31 min  PT Assessment / Plan / Recommendation Comments on Treatment Session  Pt adm with UTI and found to have rt hip fx.  No surgery planned until UTI resolved.  Pt made NWB on RLE.  Pt with slight improvement in sitting balance but still not ready to attempt any further mobility except with lift.    Follow Up Recommendations  Skilled nursing facility    Barriers to Discharge        Equipment Recommendations  Defer to next venue    Recommendations for Other Services    Frequency Min 3X/week   Plan Discharge plan remains appropriate;Frequency remains appropriate    Precautions / Restrictions Precautions Precautions: Fall Restrictions RLE Weight Bearing: Non weight bearing   Pertinent Vitals/Pain Rt hip pain only with mobility.    Mobility  Bed Mobility Rolling Right: 1: +2 Total assist Rolling Right: Patient Percentage: 20% Rolling Left: 1: +1 Total assist Rolling Left: Patient Percentage: 20% Supine to Sit: 1: +2 Total assist Supine to Sit: Patient Percentage: 20% Sitting - Scoot to Edge of Bed: 1: +2 Total assist Sitting - Scoot to Edge of Bed: Patient Percentage: 20% Sit to Supine: 1: +2 Total assist Sit to Supine: Patient Percentage: 20% Details for Bed Mobility Assistance: Pt needs extensive assist to move legs and bring trunk up. Transfers Transfer via Lift Equipment: Maximove Details for Transfer Assistance: Bed to chair with Maximove.    Exercises     PT Diagnosis:    PT Problem List:   PT Treatment Interventions:     PT Goals Acute Rehab PT Goals PT Goal: Supine/Side to Sit - Progress: Not progressing PT Goal: Sit at Edge Of Bed - Progress: Progressing toward goal PT Goal: Sit to Supine/Side - Progress: Not progressing PT Transfer Goal: Bed to Chair/Chair to Bed - Progress: Not  progressing PT Goal: Propel Wheelchair - Progress: Not progressing  Visit Information  Last PT Received On: 10/29/11 Assistance Needed: +2    Subjective Data  Subjective: "That hurts," pt states about rt hip while moving.   Cognition  Overall Cognitive Status: Appears within functional limits for tasks assessed/performed Area of Impairment: Awareness of deficits Arousal/Alertness: Awake/alert Behavior During Session: [facility] for tasks performed Awareness of Deficits: Pt still not aware of extent of his weakness and his rt lean.    Balance  Static Sitting Balance Static Sitting - Balance Support: Bilateral upper extremity supported Static Sitting - Level of Assistance: 3: Mod assist Static Sitting - Comment/# of Minutes: Sat EOB 8-9 minutes with rt. lean requiring between min and mod assist and tactile/verbal cues to correct.  Cued pt to prop on lt elbow to help correct lean.  End of Session PT - End of Session Equipment Utilized During Treatment: Other (comment) (maximove) Activity Tolerance: Patient tolerated treatment well Patient left: in chair;with call bell/phone within reach;with family/visitor present Nurse Communication: Need for lift equipment    [name] 10/29/2011, 12:14 PM  [facility] PT [phone]

## 2011-10-29 NOTE — Evaluation (Signed)
Clinical/Bedside Swallow Evaluation Patient Details  Name: [name] MRN: [id number] Date of Birth: Apr 29, 1932  Today's Date: 10/29/2011 Time: 1478-2956 SLP Time Calculation (min): 23 min  Past Medical History:  Past Medical History  Diagnosis Date  . A-fib   . CAD (coronary artery disease)   . Hyperlipidemia   . Diabetes mellitus   . Hypertension   . Suprapubic catheter   . Peripheral vascular disease   . Arthritis   . Cancer     prostate cancer   Past Surgical History:  Past Surgical History  Procedure Date  . Prostatectomy     radiation therapy  . Bladder cath   . Laminectomy     lumbar  . Lumbar disc surgery   . Right colectomy   . Incisional hernia repair   . Oronary artery bypass   . Total hip arthroplasty     RT  . Cataract extraction 2012    Left 03/2011, Right 04/2011   HPI:  78 year-old male with known history of prostate cancer status post radiation and is on suprapubic catheter, diabetes mellitus type 2, CAD status post CABG, atrial fibrillation on Coumadin, admitted with complicated UTI, right hip pain due to periprosthetic fracture.  Noted by RN to be coughing occasionally with POs on 5/17.  Fever persisting despite five days on ABX.  Chest: clear to auscultation bilaterally, no wheezing rales or rhonchi.  Clinical swallow eval ordered to determine risk of aspiration.     Assessment / Plan / Recommendation Clinical Impression  Pt presents with functional oropharyngeal swallow - improved today per wife, possibly attributable to pt sitting in chair at 90 degrees and improved mental status.  Pt demonstrated frequent belching throughout assessment, indicative of potential esophageal involvement.  Tended to consume large amounts of solids followed by attempt to drink copious liquids to transfer POs through - wife reported this causes coughing with pt.  No evidence of aspiration today when eating/drinking with precautions in place.  REC continue regular  consistency diet with thin liquids; sit upright at 90 degrees for meals; do not mix solids and liquids orally (swallow each consistency individually.)  Will f/u x1 to ensure pt is following-through with precautions.       Diet Recommendation Regular;Thin liquid   Other  Recommendations     Follow Up Recommendations  None    Frequency and Duration min 1 x/week  1 week        SLP Swallow Goals Patient will consume recommended diet without observed clinical signs of aspiration with: Independent assistance Patient will utilize recommended strategies during swallow to increase swallowing safety with: Independent assistance   [name] L. [name], [place] CCC/SLP Pager [phone]   [name] [name] 10/29/2011,12:28 PM

## 2011-10-29 NOTE — Progress Notes (Signed)
Clinical Social Worker met with pt, pt wife, and pt daughter at bedside this morning. Pt and pt family report that top three choices for facility are 1)[facility], 2)[facility], and 3) [facility]. Clinical Social Worker received return call from [facility] who stated that facility is not contracted with pt insurance. Clinical Social Worker notified pt and pt wife at bedside. Pt and pt wife choose bed at [facility]. Clinical Social Worker notified [employer]. Per MD, pt not yet medically ready for discharge. Clinical Social Worker to facilitate pt discharge needs when pt medically stable for discharge.  Jacklynn Lewis, MSW, LCSWA  Clinical Social Work [phone]

## 2011-10-29 NOTE — Progress Notes (Signed)
ANTICOAGULATION CONSULT NOTE - Follow Up Consult  Pharmacy Consult for coumadin Indication: atrial fibrillation  Vital Signs: Temp: 100.4 F (38 C) (05/21 0527) Temp src: Oral (05/21 0527) BP: 159/65 mmHg (05/21 0835) Pulse Rate: 96  (05/21 0835)  Labs:  Basename 10/29/11 0550 10/28/11 0520 10/27/11 0540  HGB 9.4* 9.3* --  HCT 27.9* 26.8* 26.7*  PLT 193 166 141*  APTT -- -- --  LABPROT 36.2* 30.1* 36.2*  INR 3.57* 2.82* 3.57*  HEPARINUNFRC -- -- --  CREATININE 0.92 0.84 0.98  CKTOTAL -- -- --  CKMB -- -- --  TROPONINI -- -- --    Estimated Creatinine Clearance: 67 ml/min (by C-G formula based on Cr of 0.92).  Assessment: 79 yom on coumadin for afib. INR today is supratherapeutic at 3.57 after one dose of previous home dose. No bleeding noted. CBC remains stable.   Goal of Therapy:  INR 2-3   Plan:  1. No coumadin tonight 2. F/u AM INR  Wyonia Fontanella, Drake Leach 10/29/2011,9:36 AM

## 2011-10-29 NOTE — Consult Note (Signed)
Date of Admission:  10/23/2011  Date of Consult:  10/29/2011  Reason for Consult: Fever Referring Physician: Arthor Captain  Impression/Recommendation Fever Urinary tract infection Hip Fracture CHF Day 6 anbx Would -repeat Cx's Change zosyn to levaquin Stop vanco Pull PIC Check ABI Comment- reason for his fevers is unclear (PIC, drug, untreated ESBL in urine, ischemic R foot). hopefully giving him less anbx and removing PIC will help to decrease his temps.   [name] is an 78 y.o. male.  HPI: 78 yo M with hx of DMs, CABG, prostate CA (XRT) with suprapubic catheter (since 2006), PVD, comes to ED 5-16 with f/c x 48h. UA was felt to be c/i UTI. He was started on zosyn, vanco added due to pt's hx of staph UTI.  His UCx was polymicrobial and he appeared to be ready for d/c by 5-20. Overnight he developed fever to 102.8 His course also been complicated by finding R hip peri-prosthetic fracture.   Past Medical History  Diagnosis Date  . A-fib   . CAD (coronary artery disease)   . Hyperlipidemia   . Diabetes mellitus   . Hypertension   . Suprapubic catheter   . Peripheral vascular disease   . Arthritis   . Cancer     prostate cancer    Past Surgical History  Procedure Date  . Prostatectomy     radiation therapy  . Bladder cath   . Laminectomy     lumbar  . Lumbar disc surgery   . Right colectomy   . Incisional hernia repair   . Oronary artery bypass   . Total hip arthroplasty     RT  . Cataract extraction 2012    Left 03/2011, Right 04/2011  ergies:   Allergies  Allergen Reactions  . Codeine Other (See Comments)    unknown  . Penicillins Other (See Comments)    unknown    Medications:  Scheduled:   . amitriptyline  50 mg Oral QHS  . atorvastatin  80 mg Oral Daily  . digoxin  125 mcg Oral Daily  . feeding supplement  237 mL Oral TID BM  . furosemide  20 mg Intravenous Once  . furosemide  40 mg Intravenous Once  . gabapentin  300 mg Oral TID  . insulin aspart   0-9 Units Subcutaneous TID WC  . metoprolol succinate  100 mg Oral BID  . omega-3 acid ethyl esters  1 g Oral Q breakfast  . omeprazole  20 mg Oral Q1200  . piperacillin-tazobactam (ZOSYN)  IV  3.375 g Intravenous Q8H  . potassium chloride SA      . potassium chloride SA      . potassium chloride  40 mEq Oral Q6H  . potassium chloride  40 mEq Oral Once  . ramipril  10 mg Oral Daily  . sodium chloride  3 mL Intravenous Q12H  . vancomycin  1,000 mg Intravenous Q12H  . warfarin  3 mg Oral ONCE-1800  . Warfarin - Pharmacist Dosing Inpatient   Does not apply q1800    Social History:  reports that he quit smoking about 41 years ago. His smoking use included Cigarettes. He does not have any smokeless tobacco history on file. He reports that he does not drink alcohol or use illicit drugs.  Family History  Problem Relation Age of Onset  . Coronary artery disease      siblings  . Heart attack Father   . Diabetes Sister   . Hyperlipidemia  family history    General ROS: normal BM this AM (no diarrhea, stays constipated due to meds), no change in foley output/no obstruction, no pain, wife noted that he had difficultyw ith swallowing study this AM (no aspiration noted)  Blood pressure 137/57, pulse 96, temperature 99.9 F (37.7 C), temperature source Oral, resp. rate 20, height 5\' 9"  (1.753 m), weight 70.4 kg (155 lb 3.3 oz), SpO2 96.00%. General appearance: alert, appears stated age, no distress and pale Eyes: negative findings: conjunctivae and sclerae normal and pupils equal, round, reactive to light and accomodation Throat: few white particles Neck: no adenopathy Lungs: clear to auscultation bilaterally Heart: irregularly irregular rhythm Abdomen: normal findings: bowel sounds normal, soft, non-tender and supra-pubic catheter has no d/c, is non-tender. Extremities: R foot is cool compared to L. his LUE PIC is non-tender.   Results for orders placed during the hospital encounter  of 10/23/11 (from the past 48 hour(s))  GLUCOSE, CAPILLARY     Status: Abnormal   Collection Time   10/27/11  5:06 PM      Component Value Range Comment   Glucose-Capillary 165 (*) 70 - 99 (mg/dL)   GLUCOSE, CAPILLARY     Status: Abnormal   Collection Time   10/27/11  9:05 PM      Component Value Range Comment   Glucose-Capillary 219 (*) 70 - 99 (mg/dL)    Comment 1 Notify RN     PROTIME-INR     Status: Abnormal   Collection Time   10/28/11  5:20 AM      Component Value Range Comment   Prothrombin Time 30.1 (*) 11.6 - 15.2 (seconds)    INR 2.82 (*) 0.00 - 1.49    BASIC METABOLIC PANEL     Status: Abnormal   Collection Time   10/28/11  5:20 AM      Component Value Range Comment   Sodium 135  135 - 145 (mEq/L)    Potassium 3.4 (*) 3.5 - 5.1 (mEq/L)    Chloride 102  96 - 112 (mEq/L)    CO2 21  19 - 32 (mEq/L)    Glucose, Bld 156 (*) 70 - 99 (mg/dL)    BUN 11  6 - 23 (mg/dL)    Creatinine, Ser 1.61  0.50 - 1.35 (mg/dL)    Calcium 8.3 (*) 8.4 - 10.5 (mg/dL)    GFR calc non Af Amer 82 (*) >90 (mL/min)    GFR calc Af Amer >90  >90 (mL/min)   CBC     Status: Abnormal   Collection Time   10/28/11  5:20 AM      Component Value Range Comment   WBC 13.4 (*) 4.0 - 10.5 (K/uL)    RBC 2.86 (*) 4.22 - 5.81 (MIL/uL)    Hemoglobin 9.3 (*) 13.0 - 17.0 (g/dL)    HCT 09.6 (*) 04.5 - 52.0 (%)    MCV 93.7  78.0 - 100.0 (fL)    MCH 32.5  26.0 - 34.0 (pg)    MCHC 34.7  30.0 - 36.0 (g/dL)    RDW 40.9  81.1 - 91.4 (%)    Platelets 166  150 - 400 (K/uL)   GLUCOSE, CAPILLARY     Status: Abnormal   Collection Time   10/28/11  8:00 AM      Component Value Range Comment   Glucose-Capillary 165 (*) 70 - 99 (mg/dL)   VANCOMYCIN, TROUGH     Status: Abnormal   Collection Time   10/28/11 10:31 AM  Component Value Range Comment   Vancomycin Tr 9.1 (*) 10.0 - 20.0 (ug/mL)   GLUCOSE, CAPILLARY     Status: Abnormal   Collection Time   10/28/11 12:05 PM      Component Value Range Comment    Glucose-Capillary 208 (*) 70 - 99 (mg/dL)   GLUCOSE, CAPILLARY     Status: Abnormal   Collection Time   10/28/11  5:06 PM      Component Value Range Comment   Glucose-Capillary 175 (*) 70 - 99 (mg/dL)   GLUCOSE, CAPILLARY     Status: Abnormal   Collection Time   10/28/11  9:47 PM      Component Value Range Comment   Glucose-Capillary 138 (*) 70 - 99 (mg/dL)   PROTIME-INR     Status: Abnormal   Collection Time   10/29/11  5:50 AM      Component Value Range Comment   Prothrombin Time 36.2 (*) 11.6 - 15.2 (seconds)    INR 3.57 (*) 0.00 - 1.49    BASIC METABOLIC PANEL     Status: Abnormal   Collection Time   10/29/11  5:50 AM      Component Value Range Comment   Sodium 135  135 - 145 (mEq/L)    Potassium 3.5  3.5 - 5.1 (mEq/L)    Chloride 102  96 - 112 (mEq/L)    CO2 24  19 - 32 (mEq/L)    Glucose, Bld 137 (*) 70 - 99 (mg/dL)    BUN 12  6 - 23 (mg/dL)    Creatinine, Ser 1.91  0.50 - 1.35 (mg/dL)    Calcium 8.3 (*) 8.4 - 10.5 (mg/dL)    GFR calc non Af Amer 79 (*) >90 (mL/min)    GFR calc Af Amer >90  >90 (mL/min)   CBC     Status: Abnormal   Collection Time   10/29/11  5:50 AM      Component Value Range Comment   WBC 11.7 (*) 4.0 - 10.5 (K/uL)    RBC 2.94 (*) 4.22 - 5.81 (MIL/uL)    Hemoglobin 9.4 (*) 13.0 - 17.0 (g/dL)    HCT 47.8 (*) 29.5 - 52.0 (%)    MCV 94.9  78.0 - 100.0 (fL)    MCH 32.0  26.0 - 34.0 (pg)    MCHC 33.7  30.0 - 36.0 (g/dL)    RDW 62.1  30.8 - 65.7 (%)    Platelets 193  150 - 400 (K/uL)   GLUCOSE, CAPILLARY     Status: Abnormal   Collection Time   10/29/11  7:41 AM      Component Value Range Comment   Glucose-Capillary 133 (*) 70 - 99 (mg/dL)   GLUCOSE, CAPILLARY     Status: Abnormal   Collection Time   10/29/11  1:10 PM      Component Value Range Comment   Glucose-Capillary 164 (*) 70 - 99 (mg/dL)       Component Value Date/Time   SDES URINE, SUPRAPUBIC 10/25/2011 1933   SPECREQUEST NONE 10/25/2011 1933   CULT NO GROWTH 10/25/2011 1933   REPTSTATUS  10/27/2011 FINAL 10/25/2011 1933   Mr Brain Wo Contrast  10/29/2011  *RADIOLOGY REPORT*  Clinical Data: Right-sided weakness.  Rule out stroke.  MRI HEAD WITHOUT CONTRAST  Technique:  Multiplanar, multiecho pulse sequences of the brain and surrounding structures were obtained according to standard protocol without intravenous contrast.  Comparison: None.  Findings: Image quality degraded by patient motion.  Negative  for acute infarct.  Atrophy and moderate chronic microvascular ischemic changes in the white matter.  Brainstem and cerebellum are intact.  Basal ganglia is intact.  Negative for hemorrhage or mass lesion.  Mucosal thickening in the paranasal sinuses compatible with chronic sinusitis.  IMPRESSION: Negative for acute infarct.  Atrophy and chronic microvascular ischemic change.  Sinusitis.  Original Report Authenticated By: Camelia Phenes, M.D.   Dg Chest Port 1 View  10/28/2011  *RADIOLOGY REPORT*  Clinical Data: Bedside PICC placement.  PORTABLE CHEST - 1 VIEW 10/28/2011 2300 hours:  Comparison: Portable chest x-ray earlier same date 2138 hours.  Findings: Left arm PICC tip in the SVC.  Prior sternotomy for CABG. Cardiac silhouette enlarged but stable.  Moderate diffuse interstitial pulmonary edema, slightly increased since the earlier examination.  Possible small pleural effusions.  IMPRESSION:  1.  Left arm PICC tip in the SVC. 2.  Progressive interstitial pulmonary edema since the examination earlier tonight.  Original Report Authenticated By: Arnell Sieving, M.D.   Dg Chest Port 1 View  10/28/2011  *RADIOLOGY REPORT*  Clinical Data: Chest congestion.  Wheezing.  PORTABLE CHEST - 1 VIEW 10/28/2011 2138 hours:  Comparison: Two-view chest x-ray 10/23/2011, 05/09/2009.  Findings: Prior sternotomy for CABG.  Cardiac silhouette enlarged but stable.  Interval development of pulmonary venous hypertension and mild interstitial pulmonary edema since the examination 5 days ago.  Right pleural  effusion suspected.  No confluent airspace consolidation.  IMPRESSION: Mild CHF and/or fluid overload, with new mild interstitial pulmonary edema and probable right pleural effusion.  Original Report Authenticated By: Arnell Sieving, M.D.    Thank you so much for this interesting consult,   Johny Sax [phone] 10/29/2011, 2:48 PM     LOS: 6 days

## 2011-10-29 NOTE — Plan of Care (Signed)
Problem: Phase II Progression Outcomes Goal: Progress activity as tolerated unless otherwise ordered Outcome: Progressing Up in chair by PT;then back to bed via maximove by nurse and NT

## 2011-10-30 ENCOUNTER — Inpatient Hospital Stay (HOSPITAL_COMMUNITY): Payer: Medicare Other

## 2011-10-30 DIAGNOSIS — I739 Peripheral vascular disease, unspecified: Secondary | ICD-10-CM

## 2011-10-30 DIAGNOSIS — I4891 Unspecified atrial fibrillation: Secondary | ICD-10-CM

## 2011-10-30 DIAGNOSIS — S72009A Fracture of unspecified part of neck of unspecified femur, initial encounter for closed fracture: Secondary | ICD-10-CM

## 2011-10-30 DIAGNOSIS — E782 Mixed hyperlipidemia: Secondary | ICD-10-CM

## 2011-10-30 DIAGNOSIS — R509 Fever, unspecified: Secondary | ICD-10-CM

## 2011-10-30 LAB — CULTURE, BLOOD (ROUTINE X 2)
Culture  Setup Time: 201305160136
Culture  Setup Time: 201305160137
Culture: NO GROWTH

## 2011-10-30 LAB — PROTIME-INR: Prothrombin Time: 41.1 seconds — ABNORMAL HIGH (ref 11.6–15.2)

## 2011-10-30 LAB — URINE CULTURE: Culture  Setup Time: 201305220128

## 2011-10-30 LAB — BASIC METABOLIC PANEL
BUN: 14 mg/dL (ref 6–23)
Chloride: 99 mEq/L (ref 96–112)
GFR calc Af Amer: >90 mL/min (ref >90)
Glucose, Bld: 149 mg/dL — ABNORMAL HIGH (ref 70–99)
Potassium: 3.3 mEq/L — ABNORMAL LOW (ref 3.5–5.1)

## 2011-10-30 LAB — GLUCOSE, CAPILLARY

## 2011-10-30 MED ORDER — FUROSEMIDE 10 MG/ML IJ SOLN
40.0000 mg | Freq: Once | INTRAMUSCULAR | Status: AC
  Administered 2011-10-30: 40 mg via INTRAVENOUS
  Filled 2011-10-30: qty 4

## 2011-10-30 MED ORDER — POTASSIUM CHLORIDE CRYS ER 20 MEQ PO TBCR
40.0000 meq | EXTENDED_RELEASE_TABLET | Freq: Once | ORAL | Status: AC
  Administered 2011-10-30: 40 meq via ORAL
  Filled 2011-10-30: qty 2

## 2011-10-30 NOTE — Progress Notes (Signed)
Spoke with Dr. Jerral Ralph about the development of patient's rash; he stated he would assess it.  Tirrell Buchberger, Joan Mayans

## 2011-10-30 NOTE — Progress Notes (Signed)
DAILY PROGRESS NOTE                              GENERAL INTERNAL MEDICINE [facility]  SUBJECTIVE: Awake, alert and oriented. Low grade fever overnight  OBJECTIVE: BP 136/72  Pulse 75  Temp(Src) 98.3 F (36.8 C) (Oral)  Resp 16  Ht 5\' 9"  (1.753 m)  Wt 70.4 kg (155 lb 3.3 oz)  BMI 22.92 kg/m2  SpO2 98%  Intake/Output Summary (Last 24 hours) at 10/30/11 1100 Last data filed at 10/30/11 0600  Gross per 24 hour  Intake   1220 ml  Output   4525 ml  Net  -3305 ml                      Weight change:  Physical Exam: General: Alert, disoriented not in any acute distress. HEENT: anicteric sclera, pupils equal reactive to light and accommodation CVS: S1-S2 heard, no murmur rubs or gallops Chest: clear to auscultation bilaterally, bibasilar rales Abdomen:  normal bowel sounds, soft, nontender, nondistended, no organomegaly Neuro: Cranial nerves II-XII intact, no focal neurological deficits Extremities: no cyanosis, no clubbing or edema noted bilaterally Skin-maculopapular rash-right lateral thing and back   Lab Results:  Basename 10/30/11 0550 10/29/11 0550  NA 135 135  K 3.3* 3.5  CL 99 102  CO2 26 24  GLUCOSE 149* 137*  BUN 14 12  CREATININE 0.81 0.92  CALCIUM 8.2* 8.3*  MG -- --  PHOS -- --   No results found for this basename: AST:2,ALT:2,ALKPHOS:2,BILITOT:2,PROT:2,ALBUMIN:2 in the last 72 hours No results found for this basename: LIPASE:2,AMYLASE:2 in the last 72 hours  Basename 10/29/11 0550 10/28/11 0520  WBC 11.7* 13.4*  NEUTROABS -- --  HGB 9.4* 9.3*  HCT 27.9* 26.8*  MCV 94.9 93.7  PLT 193 166   No results found for this basename: CKTOTAL:3,CKMB:3,CKMBINDEX:3,TROPONINI:3 in the last 72 hours No components found with this basename: POCBNP:3 No results found for this basename: DDIMER:2 in the last 72 hours No results found for this basename: HGBA1C:2 in the last 72 hours No results found for this basename:  CHOL:2,HDL:2,LDLCALC:2,TRIG:2,CHOLHDL:2,LDLDIRECT:2 in the last 72 hours No results found for this basename: TSH,T4TOTAL,FREET3,T3FREE,THYROIDAB in the last 72 hours No results found for this basename: VITAMINB12:2,FOLATE:2,FERRITIN:2,TIBC:2,IRON:2,RETICCTPCT:2 in the last 72 hours  Micro Results: Recent Results (from the past 240 hour(s))  CULTURE, BLOOD (ROUTINE X 2)     Status: Normal   Collection Time   10/23/11  8:49 PM      Component Value Range Status Comment   Specimen Description BLOOD HAND RIGHT   Final    Special Requests BOTTLES DRAWN AEROBIC ONLY 10CC   Final    Culture  Setup Time [id number]   Final    Culture NO GROWTH 5 DAYS   Final    Report Status 10/30/2011 FINAL   Final   CULTURE, BLOOD (ROUTINE X 2)     Status: Normal   Collection Time   10/23/11  8:57 PM      Component Value Range Status Comment   Specimen Description BLOOD HAND LEFT   Final    Special Requests BOTTLES DRAWN AEROBIC ONLY [facility]   Final    Culture  Setup Time [id number]   Final    Culture NO GROWTH 5 DAYS   Final    Report Status 10/30/2011 FINAL   Final   GRAM STAIN     Status: Normal  Collection Time   10/23/11  9:36 PM      Component Value Range Status Comment   Specimen Description URINE, CATHETERIZED   Final    Special Requests NONE   Final    Gram Stain     Final    Value: CYTOSPIN SLIDE     WBC PRESENT,BOTH PMN AND MONONUCLEAR     GRAM NEGATIVE RODS   Report Status 10/23/2011 FINAL   Final   URINE CULTURE     Status: Normal   Collection Time   10/23/11  9:37 PM      Component Value Range Status Comment   Specimen Description URINE, CATHETERIZED   Final    Special Requests NONE   Final    Culture  Setup Time [id number]   Final    Colony Count >=100,000 COLONIES/ML   Final    Culture     Final    Value: Multiple bacterial morphotypes present, none predominant. Suggest appropriate recollection if clinically indicated.   Report Status 10/24/2011 FINAL   Final   MRSA PCR SCREENING      Status: Normal   Collection Time   10/24/11  3:00 AM      Component Value Range Status Comment   MRSA by PCR NEGATIVE  NEGATIVE  Final   URINE CULTURE     Status: Normal   Collection Time   10/25/11  7:33 PM      Component Value Range Status Comment   Specimen Description URINE, SUPRAPUBIC   Final    Special Requests NONE   Final    Culture  Setup Time [id number]   Final    Colony Count NO GROWTH   Final    Culture NO GROWTH   Final    Report Status 10/27/2011 FINAL   Final     Studies/Results: Dg Chest 2 View  10/23/2011  *RADIOLOGY REPORT*  Clinical Data: Fatigue.  Fever.  Altered level of consciousness.  CHEST - 2 VIEW  Comparison: 05/09/2009  Findings: Prior CABG noted.  Mild cardiomegaly is present with indistinct pulmonary vasculature and cephalization of blood flow suggesting pulmonary venous hypertension.  Overt edema is not observed.  Linear subsegmental atelectasis noted at the left lung base.  Atherosclerotic calcification of the aortic arch is noted.  No pleural effusion is identified.  IMPRESSION:  1.  Cardiomegaly with pulmonary venous hypertension.  No overt edema. 2.  Atherosclerosis.  Original Report Authenticated By: Dellia Cloud, M.D.   Dg Hip Complete Right  10/24/2011  *RADIOLOGY REPORT*  Clinical Data: Right hip pain.  Remote hip surgery.  RIGHT HIP - COMPLETE 2+ VIEW  Comparison: 05/10/2009.  Findings: Interval protrusio of the right hip prosthesis with areas of lucency and sclerosis around the acetabular cup.  The acetabular cup has rotated into a more vertical position.  The femoral component is intact.  No findings for loosening or fracture.  There appear to be pathologic or stress fractures involving the obturator ring.  Inferior pubic ramus fracture noted and probable fracture at the ischiopubic junction. CT recommended for further evaluation and preoperative planning.  IMPRESSION:  1.  Protrusio and rotation of the acetabular cup. 2.  Pathologic /stress  fractures involving the obturator ring. Cannot exclude underlying particle disease or infection.  CT recommended for further evaluation.  Original Report Authenticated By: P. Loralie Champagne, M.D.   Medications: Scheduled Meds:    . amitriptyline  50 mg Oral QHS  . atorvastatin  80 mg Oral Daily  .  digoxin  125 mcg Oral Daily  . feeding supplement  237 mL Oral TID BM  . gabapentin  300 mg Oral TID  . insulin aspart  0-9 Units Subcutaneous TID WC  . levofloxacin  500 mg Oral Q24H  . metoprolol succinate  100 mg Oral BID  . omega-3 acid ethyl esters  1 g Oral Q breakfast  . omeprazole  20 mg Oral Q1200  . ramipril  10 mg Oral Daily  . sodium chloride  3 mL Intravenous Q12H  . Warfarin - Pharmacist Dosing Inpatient   Does not apply q1800  . DISCONTD: piperacillin-tazobactam (ZOSYN)  IV  3.375 g Intravenous Q8H  . DISCONTD: vancomycin  1,000 mg Intravenous Q12H   Continuous Infusions:   PRN Meds:.acetaminophen, acetaminophen, albuterol, guaiFENesin, morphine injection, ondansetron (ZOFRAN) IV, ondansetron, oxyCODONE-acetaminophen, sodium chloride  ASSESSMENT & PLAN: Principal Problem:  *UTI (lower urinary tract infection) Active Problems:  DIABETES MELLITUS, TYPE II  CAD, ARTERY BYPASS GRAFT  Atrial fibrillation  Fever  Interval history 78 year old Caucasian male with past medical history of diabetes, prostate cancer status post indwelling suprapubic catheter. Patient admitted to the hospital with UTI. Patient initially started on Zosyn after the urine and blood cultures obtained. Patient was still spiking fever after 24 hours and vancomycin was added after wife reported that he had history of staph UTI. Patient also has right hip pain turned out to be periprosthetic fracture. Patient was seen by orthopedics and they want to evaluate him after the infection cleared for surgery, this will be done as outpatient. Patient still spiking fever on Zosyn and vancomycin after his fever curve  was trending down, had mild fluid overload yesterday treated with Lasix, and possible aspiration as he is having cough while he is eating.  UTI -Complicated, suprapubic catheter related UTI. -Catheter was changed in the emergency department at the time of admission. -Was spiking fever on Zosyn, vancomycin added as patient had history of staph UTI. -Blood cultures obtained, repeat urine culture as the current culture showing multiple morphotypes. -Seen by ID on 5/21-recommended transition to Levaquin, low grade fever overnight-monitor for another 24 hours more-if no fever-then D/C to SNF on 10/31/11  Chronic pain and weakness in the right hip -X-ray showed protrusion and migration of the acetabular cup -Stress fracture of the obturator ring. -Orthopedics recommended to followup with him as outpatient, he'll likely need surgery.  Fluid overload -Mild pulmonary edema, IV fluids stopped. -Patient started on IV Lasix, had over 5 L urine output yesterday. I will repeat the Lasix dose today-5/22 as well -Patient feels much better, shortness of breath almost resolved-however CXR still shows congestion -check 2 D echo  Maculopapular rash -?antibiotic -monitor for now  Diabetes mellitus type 2 -Controlled with hemoglobin of 6.9. -Continue preadmission medication, utilize SSI and carb modified diet while in the hospital.  Atrial fibrillation -Rate controlled. -On chronic Coumadin, pharmacy is to dose of Coumadin.  History of prostate cancer -Patient had previous treatment. PSA is suppressed.  Disposition -SNF in 1-2 days.   LOS: 7 days   [facility] 10/30/2011, 11:00 AM

## 2011-10-30 NOTE — Progress Notes (Signed)
10/30/11 NSG 1700 Pt. Had fever of 102.0, DR. Ghimire notified, no new orders.  Tylenol 650 mg given as ordered.  Will continue to monitor.  Forbes Cellar, RN

## 2011-10-30 NOTE — Progress Notes (Signed)
Please consider ordering foot drop boots and specialty bed for patient. As it appears patient may be developing foot drop and patient back buttocks and groin are red from sweating often. Thanks.

## 2011-10-30 NOTE — Progress Notes (Signed)
ANTICOAGULATION CONSULT NOTE - Follow Up Consult  Pharmacy Consult for coumadin Indication: atrial fibrillation  Vital Signs: Temp: 98.3 F (36.8 C) (05/22 0431) Temp src: Oral (05/22 0431) BP: 136/72 mmHg (05/22 0431) Pulse Rate: 75  (05/22 0431)  Labs:  Basename 10/30/11 0550 10/29/11 0550 10/28/11 0520  HGB -- 9.4* 9.3*  HCT -- 27.9* 26.8*  PLT -- 193 166  APTT -- -- --  LABPROT 41.1* 36.2* 30.1*  INR 4.20* 3.57* 2.82*  HEPARINUNFRC -- -- --  CREATININE 0.81 0.92 0.84  CKTOTAL -- -- --  CKMB -- -- --  TROPONINI -- -- --    Estimated Creatinine Clearance: 76 ml/min (by C-G formula based on Cr of 0.81).  Assessment: 79 yom on coumadin for afib. INR today has increased again to 4.2. No bleeding noted. CBC stable. Started on levaquin which can increase the INR.   Goal of Therapy:  INR 2-3   Plan:  1. No coumadin tonight 2. F/u AM INR  Novi Calia, Drake Leach 10/30/2011,9:06 AM

## 2011-10-30 NOTE — Progress Notes (Signed)
Text paged [facility] about patient's back, buttocks, and groin rash as well as foot drop.  Patient denies SOB or itching, VS stable.  He has had intermittent fevers and has been sweating a lot for the past couple of days.  No return call.  Will continue to monitor patient and notify am MDs.  Marcia Lepera, Joan Mayans, RN

## 2011-10-30 NOTE — Progress Notes (Signed)
Speech Language Pathology Dysphagia Treatment Patient Details Name: [name] MRN: [id number] DOB: 1931/08/06 Today's Date: 10/30/2011 Time: 4098-1191 SLP Time Calculation (min): 10 min  Assessment / Plan / Recommendation Clinical Impression  F/u after 5/21 clinical swallow eval:  Pt with decreased swallow function today - appeared to be more fatigued/confused;  slumped in bed despite frequent repositioning for optimal PO intake.  Consumed multiple boluses of thin liquid, but with wet-sounding phonation and spillage from right oral cavity (not visualized yesterday.)  Pt reported no difficulty with PO consumption; wife not present. RN reports infrequent coughing with meals. Continues with persisting fever.  Recommend proceeding with MBS tomorrow, 5/23, to objectively assess swallow and determine presence of pharyngeal dysphagia.      Diet Recommendation  Continue with Current Diet: Regular;Thin liquid : please provide full supervision to ensure pt eating at slow rate and is sitting at 90 degrees.   SLP Plan MBS   Pertinent Vitals/Pain No pain   Swallowing Goals  SLP Swallowing Goals Patient will consume recommended diet without observed clinical signs of aspiration with independence: currently at Minimal assistance Patient will utilize recommended strategies during swallow to increase swallowing safety with independence: currently at Minimal assistance   General Temperature Spikes Noted: Yes; lungs diminished bilaterally; cxr persisting congestion Behavior/Cognition: Lethargic Oral Cavity - Dentition: Dentures, top;Dentures, bottom Patient Positioning: Partially reclined       Dysphagia Treatment Treatment focused on: Skilled observation of diet tolerance;Utilization of compensatory strategies Treatment Methods/Modalities: Skilled observation;Differential diagnosis Patient observed directly with PO's: Yes Type of PO's observed: Thin liquids Feeding: Able to feed self Liquids  provided via: Cup Oral Phase Signs & Symptoms:  (spillage from right side of mouth) Pharyngeal Phase Signs & Symptoms:  (wet-sounding phonation) Type of cueing: Verbal Amount of cueing: Minimal    [name] L. [name], [place] CCC/SLP Pager [phone]  [name] 10/30/2011, 5:42 PM

## 2011-10-30 NOTE — Clinical Documentation Improvement (Signed)
GENERIC DOCUMENTATION CLARIFICATION QUERY  THIS DOCUMENT IS NOT A PERMANENT PART OF THE MEDICAL RECORD  TO RESPOND TO THE THIS QUERY, FOLLOW THE INSTRUCTIONS BELOW:  1. If needed, update documentation for the patient's encounter via the notes activity.  2. Access this query again and click edit on the In [employer].  3. After updating, or not, click F2 to complete all highlighted (required) fields concerning your review. Select "additional documentation in the medical record" OR "no additional documentation provided".  4. Click Sign note button.  5. The deficiency will fall out of your In Basket *Please let us know if you are not able to complete this workflow by phone or e-mail (listed below).  Please update your documentation within the medical record to reflect your response to this query.                                                                                        10/30/11   Dear Dr.[facility],  In a better effort to capture your patient's severity of illness, reflect appropriate length of stay and utilization of resources, a review of the patient medical record has revealed the following indicators.    Based on your clinical judgment, please clarify and document in a progress note and/or discharge summary the clinical condition associated with the following supporting information:  In responding to this query please exercise your independent judgment.  The fact that a query is asked, does not imply that any particular answer is desired or expected.   "Mild Pulmonary Edema" has been documented. Please specify in Progress Notes and Discharge Summary if condition is Acute or Chronic.  Possible Clinical Conditions  Acute Pulmonary Edema  Other Condition  Cannot Clinically Determine   Signs/Symptoms: "Shortness of breath almost resolved" per PN "Bibasilar rales" per PN  Diagnostics: Chest xray on 10-30-11 Comparison: 10/28/2011  Findings:  Cardiomediastinal silhouette is stable. Status post CABG  again noted. Stable left arm PICC line position.  Persistent mild pulmonary edema right greater than left. Question  small right pleural effusion with right basilar atelectasis or  infiltrate.  IMPRESSION:  Persistent mild pulmonary edema right greater than left. Question  small right pleural effusion with right basilar atelectasis or  Infiltrate  Chest x-ray on 10/23/11 Findings: Prior CABG noted. Mild cardiomegaly is present with  indistinct pulmonary vasculature and cephalization of blood flow  suggesting pulmonary venous hypertension. Overt edema is not  observed.  Linear subsegmental atelectasis noted at the left lung base.  Atherosclerotic calcification of the aortic arch is noted.  No pleural effusion is identified.  IMPRESSION:  1. Cardiomegaly with pulmonary venous hypertension. No overt  edema.  2. Atherosclerosis.   2D Echo-pending  Treatment Lasix 40mg  IVP x 1 (5-21 & 5-22)     You may use possible, probable, or suspect with inpatient documentation. possible, probable, suspected diagnoses MUST be documented at the time of discharge  Reviewed: Query answered in progress notes  Thank You,    Rossie Muskrat RN, BSN Clinical Documentation Specialist Pager:  [phone] [email or web address]  Health Information Management [place]

## 2011-10-30 NOTE — Progress Notes (Signed)
Clinical Social Worker met with [name] and [name] at bedside. [name] reports that [name] appears to be "doing better" and [name] interested in seeing how [name] does with physical therapist and if [name] would be able to return home for the two weeks prior to surgery as [name] family has a lift at home. Clinical Social Worker discussed that this Visual merchandiser would discuss with [name] and MD. [name] and [name] agreeable to [facility] if unable to return home prior to surgery. Clinical Social Worker to continue to follow.  Jacklynn Lewis, MSW, LCSWA  Clinical Social Work [phone]

## 2011-10-30 NOTE — Progress Notes (Signed)
ABI completed.  Preliminary report is 0.35 on the right and >1.0 on the left with abnormal Doppler waveforms bilaterally.

## 2011-10-31 ENCOUNTER — Inpatient Hospital Stay (HOSPITAL_COMMUNITY): Payer: Medicare Other

## 2011-10-31 DIAGNOSIS — N390 Urinary tract infection, site not specified: Secondary | ICD-10-CM

## 2011-10-31 DIAGNOSIS — S72009A Fracture of unspecified part of neck of unspecified femur, initial encounter for closed fracture: Secondary | ICD-10-CM

## 2011-10-31 DIAGNOSIS — E782 Mixed hyperlipidemia: Secondary | ICD-10-CM

## 2011-10-31 DIAGNOSIS — R509 Fever, unspecified: Secondary | ICD-10-CM

## 2011-10-31 DIAGNOSIS — I4891 Unspecified atrial fibrillation: Secondary | ICD-10-CM

## 2011-10-31 DIAGNOSIS — I369 Nonrheumatic tricuspid valve disorder, unspecified: Secondary | ICD-10-CM

## 2011-10-31 LAB — BASIC METABOLIC PANEL
BUN: 14 mg/dL (ref 6–23)
Chloride: 97 mEq/L (ref 96–112)
Creatinine, Ser: 0.92 mg/dL (ref 0.50–1.35)
GFR calc Af Amer: >90 mL/min (ref >90)

## 2011-10-31 LAB — GLUCOSE, CAPILLARY
Glucose-Capillary: 133 mg/dL — ABNORMAL HIGH (ref 70–99)
Glucose-Capillary: 194 mg/dL — ABNORMAL HIGH (ref 70–99)
Glucose-Capillary: 178 mg/dL — ABNORMAL HIGH (ref 70–99)

## 2011-10-31 LAB — PROTIME-INR: Prothrombin Time: 34.8 seconds — ABNORMAL HIGH (ref 11.6–15.2)

## 2011-10-31 MED ORDER — FUROSEMIDE 20 MG PO TABS
20.0000 mg | ORAL_TABLET | Freq: Every day | ORAL | Status: DC
  Administered 2011-10-31 – 2011-11-01 (×2): 20 mg via ORAL
  Filled 2011-10-31 (×2): qty 1

## 2011-10-31 MED ORDER — PREDNISONE 20 MG PO TABS
40.0000 mg | ORAL_TABLET | Freq: Every day | ORAL | Status: DC
  Administered 2011-11-01 – 2011-11-03 (×2): 40 mg via ORAL
  Filled 2011-10-31 (×3): qty 2

## 2011-10-31 NOTE — Progress Notes (Signed)
ANTICOAGULATION CONSULT NOTE - Follow Up Consult  Pharmacy Consult for coumadin Indication: atrial fibrillation  Vital Signs: Temp: 98.7 F (37.1 C) (05/23 0526) Temp src: Oral (05/23 0526) BP: 106/70 mmHg (05/23 0526) Pulse Rate: 94  (05/23 0526)  Labs:  Basename 10/31/11 0400 10/30/11 0550 10/29/11 0550  HGB -- -- 9.4*  HCT -- -- 27.9*  PLT -- -- 193  APTT -- -- --  LABPROT 34.8* 41.1* 36.2*  INR 3.39* 4.20* 3.57*  HEPARINUNFRC -- -- --  CREATININE 0.92 0.81 0.92  CKTOTAL -- -- --  CKMB -- -- --  TROPONINI -- -- --    Estimated Creatinine Clearance: 67 ml/min (by C-G formula based on Cr of 0.92).  Assessment: 79 yom on coumadin for afib. INR 3.39<---4.2 after holding warfarin x2 days, last dose 5/20. Based on previous patterns, holding warfarin is warranted this evening. Patient may need reduced dose when sent to SNF, as home dose of 3 mg daily has produced supratherapeutic levels a few times in hospital, albeit in the setting of acute stress and medications. No bleeding noted. CBC not obtained since 5/21, will obtain tomorrow if patient still on service. May be discharged today to SNF. Levofloxacin now discontinued.  Goal of Therapy:  INR 2-3   Plan:  1. No warfarin tonight 2. F/u AM INR 3. CBC ordered for 5/24 in case patient remains in hospital  [place], [place] R 10/31/2011,9:45 AM

## 2011-10-31 NOTE — Progress Notes (Signed)
INFECTIOUS DISEASE PROGRESS NOTE  ID: [name] is a 78 y.o. male with   Principal Problem:  *UTI (lower urinary tract infection) Active Problems:  DIABETES MELLITUS, TYPE II  CAD, ARTERY BYPASS GRAFT  Atrial fibrillation  Fever  Subjective: Feels tired  Abtx:  Anti-infectives     Start     Dose/Rate Route Frequency Ordered Stop   10/29/11 1600   levofloxacin (LEVAQUIN) tablet 500 mg  Status:  Discontinued        500 mg Oral Every 24 hours 10/29/11 1543 10/31/11 0755   10/28/11 1200   vancomycin (VANCOCIN) IVPB 1000 mg/200 mL premix  Status:  Discontinued        1,000 mg 200 mL/hr over 60 Minutes Intravenous Every 12 hours 10/28/11 1140 10/30/11 0115   10/24/11 2300   vancomycin (VANCOCIN) 750 mg in sodium chloride 0.9 % 150 mL IVPB  Status:  Discontinued        750 mg 150 mL/hr over 60 Minutes Intravenous Every 12 hours 10/24/11 1218 10/28/11 1139   10/24/11 1230   vancomycin (VANCOCIN) 750 mg in sodium chloride 0.9 % 150 mL IVPB        750 mg 150 mL/hr over 60 Minutes Intravenous  Once 10/24/11 1218 10/24/11 1522   10/24/11 0800   piperacillin-tazobactam (ZOSYN) IVPB 3.375 g  Status:  Discontinued        3.375 g 12.5 mL/hr over 240 Minutes Intravenous Every 8 hours 10/24/11 0244 10/30/11 0115   10/24/11 0245  piperacillin-tazobactam (ZOSYN) IVPB 3.375 g       3.375 g 100 mL/hr over 30 Minutes Intravenous  Once 10/24/11 0244 10/24/11 0407   10/23/11 2300   cefTRIAXone (ROCEPHIN) 1 g in dextrose 5 % 50 mL IVPB        1 g 100 mL/hr over 30 Minutes Intravenous  Once 10/23/11 2247 10/23/11 2329          Medications:  Scheduled:   . amitriptyline  50 mg Oral QHS  . atorvastatin  80 mg Oral Daily  . digoxin  125 mcg Oral Daily  . feeding supplement  237 mL Oral TID BM  . furosemide  20 mg Oral Daily  . gabapentin  300 mg Oral TID  . insulin aspart  0-9 Units Subcutaneous TID WC  . metoprolol succinate  100 mg Oral BID  . omega-3 acid ethyl esters  1 g Oral  Q breakfast  . omeprazole  20 mg Oral Q1200  . ramipril  10 mg Oral Daily  . sodium chloride  3 mL Intravenous Q12H  . Warfarin - Pharmacist Dosing Inpatient   Does not apply q1800  . DISCONTD: levofloxacin  500 mg Oral Q24H    Objective: Vital signs in last 24 hours: Temp:  [97.9 F (36.6 C)-100.1 F (37.8 C)] 100.1 F (37.8 C) (05/23 1402) Pulse Rate:  [85-94] 91  (05/23 1402) Resp:  [16-20] 18  (05/23 1402) BP: (104-125)/(65-90) 125/90 mmHg (05/23 1402) SpO2:  [93 %-97 %] 97 % (05/23 1402)   General appearance: alert, cooperative and no distress Throat: no oral ulcers Resp: clear to auscultation bilaterally Cardio: regular rate and rhythm GI: normal findings: bowel sounds normal and soft, non-tender Skin: diffuse macular rash. worse on back, arm pits,   Lab Results  Basename 10/31/11 0400 10/30/11 0550 10/29/11 0550  WBC -- -- 11.7*  HGB -- -- 9.4*  HCT -- -- 27.9*  NA 135 135 --  K 3.3* 3.3* --  CL  97 99 --  CO2 27 26 --  BUN 14 14 --  CREATININE 0.92 0.81 --  GLU -- -- --   Liver Panel No results found for this basename: PROT:2,ALBUMIN:2,AST:2,ALT:2,ALKPHOS:2,BILITOT:2,BILIDIR:2,IBILI:2 in the last 72 hours Sedimentation Rate No results found for this basename: ESRSEDRATE in the last 72 hours C-Reactive Protein No results found for this basename: CRP:2 in the last 72 hours  Microbiology: Recent Results (from the past 240 hour(s))  CULTURE, BLOOD (ROUTINE X 2)     Status: Normal   Collection Time   10/23/11  8:49 PM      Component Value Range Status Comment   Specimen Description BLOOD HAND RIGHT   Final    Special Requests BOTTLES DRAWN AEROBIC ONLY 10CC   Final    Culture  Setup Time [id number]   Final    Culture NO GROWTH 5 DAYS   Final    Report Status 10/30/2011 FINAL   Final   CULTURE, BLOOD (ROUTINE X 2)     Status: Normal   Collection Time   10/23/11  8:57 PM      Component Value Range Status Comment   Specimen Description BLOOD HAND LEFT    Final    Special Requests BOTTLES DRAWN AEROBIC ONLY [facility]   Final    Culture  Setup Time [id number]   Final    Culture NO GROWTH 5 DAYS   Final    Report Status 10/30/2011 FINAL   Final   GRAM STAIN     Status: Normal   Collection Time   10/23/11  9:36 PM      Component Value Range Status Comment   Specimen Description URINE, CATHETERIZED   Final    Special Requests NONE   Final    Gram Stain     Final    Value: CYTOSPIN SLIDE     WBC PRESENT,BOTH PMN AND MONONUCLEAR     GRAM NEGATIVE RODS   Report Status 10/23/2011 FINAL   Final   URINE CULTURE     Status: Normal   Collection Time   10/23/11  9:37 PM      Component Value Range Status Comment   Specimen Description URINE, CATHETERIZED   Final    Special Requests NONE   Final    Culture  Setup Time [id number]   Final    Colony Count >=100,000 COLONIES/ML   Final    Culture     Final    Value: Multiple bacterial morphotypes present, none predominant. Suggest appropriate recollection if clinically indicated.   Report Status 10/24/2011 FINAL   Final   MRSA PCR SCREENING     Status: Normal   Collection Time   10/24/11  3:00 AM      Component Value Range Status Comment   MRSA by PCR NEGATIVE  NEGATIVE  Final   URINE CULTURE     Status: Normal   Collection Time   10/25/11  7:33 PM      Component Value Range Status Comment   Specimen Description URINE, SUPRAPUBIC   Final    Special Requests NONE   Final    Culture  Setup Time [id number]   Final    Colony Count NO GROWTH   Final    Culture NO GROWTH   Final    Report Status 10/27/2011 FINAL   Final   CULTURE, BLOOD (ROUTINE X 2)     Status: Normal (Preliminary result)   Collection Time   10/29/11  4:10 PM  Component Value Range Status Comment   Specimen Description BLOOD HAND RIGHT   Final    Special Requests BOTTLES DRAWN AEROBIC ONLY 10CC   Final    Culture  Setup Time [id number]   Final    Culture     Final    Value:        BLOOD CULTURE RECEIVED NO GROWTH TO DATE  CULTURE WILL BE HELD FOR 5 DAYS BEFORE ISSUING A FINAL NEGATIVE REPORT   Report Status PENDING   Incomplete   URINE CULTURE     Status: Normal   Collection Time   10/29/11  6:42 PM      Component Value Range Status Comment   Specimen Description URINE, CATHETERIZED   Final    Special Requests NONE   Final    Culture  Setup Time [id number]   Final    Colony Count NO GROWTH   Final    Culture NO GROWTH   Final    Report Status 10/30/2011 FINAL   Final     Studies/Results: Dg Chest Port 1 View  10/30/2011  *RADIOLOGY REPORT*  Clinical Data: Shortness of breath  PORTABLE CHEST - 1 VIEW  Comparison: 10/28/2011  Findings: Cardiomediastinal silhouette is stable.  Status post CABG again noted.  Stable left arm PICC line position.  Persistent mild pulmonary edema right greater than left.  Question small right pleural effusion with right basilar atelectasis or infiltrate.  IMPRESSION:  Persistent mild pulmonary edema right greater than left.  Question small right pleural effusion with right basilar atelectasis or infiltrate.  Original Report Authenticated By: Natasha Mead, M.D.   Dg Swallowing Func-no Report  10/31/2011  CLINICAL DATA: dysphagia   FLUOROSCOPY FOR SWALLOWING FUNCTION STUDY:  Fluoroscopy was provided for swallowing function study, which was  administered by a speech pathologist.  Final results and recommendations  from this study are contained within the speech pathology report.       Assessment/Plan: Fever, rash Urinary tract infection  Hip Fracture  CHF  Off anbx Would stop all medications that are not absolutely necessary. He may need steroid burst.  This does not appear to be VZV, does not follow dermatomes.   Johny Sax Infectious Diseases [phone] 10/31/2011, 5:09 PM   LOS: 8 days

## 2011-10-31 NOTE — Progress Notes (Signed)
Nutrition Follow-up  Diet Order:  Dysphagia 3, thin liquids. PO's mostly ~50%. Pt also with Glucerna TID ordered. Pt was assessed by SLP and BS was completed as pt was reported to have cough after eating.  Since pt last seen by RD, pt now has UTI and is on IV abx. PICC was placed for continued IV abx after d/c.  Waiting on pt to be afebrile before can go to SNF. Pt will likely have hip surgery in several weeks once infection clears per notes. Pts last documented fever was 5/22 1655 pm, 38.9 C.   Per pt and pt's wife, pt is drinking Glucerna. Also wife anticipated increased intake with D3 diet as pt was having difficulty with meats. Stated SLP gave recommendations for swallowing and she feels he will be able to eat better now.    Meds: Scheduled Meds:   . amitriptyline  50 mg Oral QHS  . atorvastatin  80 mg Oral Daily  . digoxin  125 mcg Oral Daily  . feeding supplement  237 mL Oral TID BM  . furosemide  40 mg Intravenous Once  . furosemide  20 mg Oral Daily  . gabapentin  300 mg Oral TID  . insulin aspart  0-9 Units Subcutaneous TID WC  . metoprolol succinate  100 mg Oral BID  . omega-3 acid ethyl esters  1 g Oral Q breakfast  . omeprazole  20 mg Oral Q1200  . potassium chloride  40 mEq Oral Once  . ramipril  10 mg Oral Daily  . sodium chloride  3 mL Intravenous Q12H  . Warfarin - Pharmacist Dosing Inpatient   Does not apply q1800  . DISCONTD: levofloxacin  500 mg Oral Q24H   Continuous Infusions:  PRN Meds:.acetaminophen, acetaminophen, albuterol, guaiFENesin, morphine injection, ondansetron (ZOFRAN) IV, ondansetron, oxyCODONE-acetaminophen, sodium chloride  Labs:  CMP     Component Value Date/Time   NA 135 10/31/2011 0400   K 3.3* 10/31/2011 0400   CL 97 10/31/2011 0400   CO2 27 10/31/2011 0400   GLUCOSE 128* 10/31/2011 0400   BUN 14 10/31/2011 0400   CREATININE 0.92 10/31/2011 0400   CREATININE 0.96 04/03/2011 0857   CALCIUM 8.4 10/31/2011 0400   PROT 6.0 10/24/2011 0534   ALBUMIN 2.8* 10/24/2011 0534   AST 18 10/24/2011 0534   ALT 13 10/24/2011 0534   ALKPHOS 63 10/24/2011 0534   BILITOT 0.7 10/24/2011 0534   GFRNONAA 79* 10/31/2011 0400   GFRAA >90 10/31/2011 0400     Intake/Output Summary (Last 24 hours) at 10/31/11 1220 Last data filed at 10/31/11 0859  Gross per 24 hour  Intake    582 ml  Output   2427 ml  Net  -1845 ml    Weight Status:  No new weight has been obtained since admission   Re-estimated needs:  1650-1875 kcal, 75-85 gm protein  Nutrition Dx:  Inadequate oral intake, ongoing  Goal:  PO intake of meals and supplements will be adequate to maintain weight.  Unable to determine if this goal was met d/t no new weights.  New Goal: PO intake of meals and supplements to be >75%   Intervention:   No new nutrition interventions at this time RD will continue to follow  Monitor:  PO intake, weight, labs, I/O's   Rudean Haskell Pager #:  [phone]

## 2011-10-31 NOTE — Progress Notes (Signed)
Physical Therapy Treatment Patient Details Name: [name] MRN: [id number] DOB: 1932-02-14 Today's Date: 10/31/2011 Time: 4098-1191 PT Time Calculation (min): 24 min  PT Assessment / Plan / Recommendation Comments on Treatment Session  Pt adm with UTI and found to have rt hip fx.  No surgery planned until UTI resolved.  Pt made NWB on RLE.  Pt continues to be unable to sit on EOB without significant assistance due to poor balance and poor trunk control.  Explained to pt and wife that until pt is able to gain some trunk control in sitting that standing and pivoting are not appropriate.  Pt left on bedpan in bed for BM.  Asked nursing to use Maximove to transfer pt to chair after he is done with the bedpan.    Follow Up Recommendations  Skilled nursing facility    Barriers to Discharge        Equipment Recommendations  Defer to next venue    Recommendations for Other Services    Frequency Min 3X/week   Plan Discharge plan remains appropriate;Frequency remains appropriate    Precautions / Restrictions Precautions Precautions: Fall Restrictions Weight Bearing Restrictions: Yes RLE Weight Bearing: Non weight bearing   Pertinent Vitals/Pain Rt hip pain with mobility but none when at rest.    Mobility  Bed Mobility Rolling Right: 2: Max assist Rolling Left: 1: +1 Total assist Supine to Sit: 1: +2 Total assist Supine to Sit: Patient Percentage: 30% Sitting - Scoot to Edge of Bed: 1: +2 Total assist Sitting - Scoot to Edge of Bed: Patient Percentage: 30% Sit to Supine: 1: +2 Total assist Sit to Supine: Patient Percentage: 30% Details for Bed Mobility Assistance: Pt able to help move legs some today.  Still with extensive assist at his trunk.    Exercises     PT Diagnosis:    PT Problem List:   PT Treatment Interventions:     PT Goals Acute Rehab PT Goals PT Goal: Supine/Side to Sit - Progress: Progressing toward goal PT Goal: Sit at Edge Of Bed - Progress: Progressing  toward goal PT Goal: Sit to Supine/Side - Progress: Progressing toward goal PT Transfer Goal: Bed to Chair/Chair to Bed - Progress: Not progressing PT Goal: Propel Wheelchair - Progress: Not progressing  Visit Information  Last PT Received On: 10/31/11 Assistance Needed: +2    Subjective Data  Subjective: Wife stated, "If he could just take a few steps."  Explained to wife that since he can't even hold himself up sitting EOB that amb not appropriate to attempt at this time.  Also explained to wife that since pt is NWB on RLE he can't walk but will have to hop on LLE.   Cognition  Area of Impairment: Awareness of deficits Arousal/Alertness: Awake/alert Behavior During Session: [facility] for tasks performed Following Commands: Follows one step commands consistently Awareness of Deficits: Pt remains unaware of body position.  Pt leaning to the rt but feels he is falling lt.    Balance  Static Sitting Balance Static Sitting - Balance Support: Bilateral upper extremity supported Static Sitting - Level of Assistance: 3: Mod assist Static Sitting - Comment/# of Minutes: Sat EOB 8-10 minutes with mod assist and occasional min A when cued to prop on lt elbow.  Pt feels he is falling to the left when he is actually leaning heavily rt. Pt also leaning posteriorly.  Brought walker in front of pt to encourage pt to lean forward.  Pt unalble to hold onto walker  in front of him due to posterior lean.  End of Session PT - End of Session Activity Tolerance: Patient tolerated treatment well Patient left: in bed;with call bell/phone within reach;with bed alarm set;with family/visitor present Nurse Communication: Need for lift equipment    [facility] 10/31/2011, 12:39 PM  [facility] PT [phone]

## 2011-10-31 NOTE — Procedures (Signed)
Objective Swallowing Evaluation: Modified Barium Swallowing Study  Patient Details  Name: [name] MRN: [id number] Date of Birth: 07/04/1931  Today's Date: 10/31/2011 Time: 4098-1191 SLP Time Calculation (min): 15 min  Past Medical History:  Past Medical History  Diagnosis Date  . A-fib   . CAD (coronary artery disease)   . Hyperlipidemia   . Diabetes mellitus   . Hypertension   . Suprapubic catheter   . Peripheral vascular disease   . Arthritis   . Cancer     prostate cancer   Past Surgical History:  Past Surgical History  Procedure Date  . Prostatectomy     radiation therapy  . Bladder cath   . Laminectomy     lumbar  . Lumbar disc surgery   . Right colectomy   . Incisional hernia repair   . Oronary artery bypass   . Total hip arthroplasty     RT  . Cataract extraction 2012    Left 03/2011, Right 04/2011   HPI:  78 year-old male with known history of prostate cancer status post radiation and is on suprapubic catheter, diabetes mellitus type 2, CAD status post CABG, atrial fibrillation on Coumadin, admitted with complicated UTI, right hip pain due to periprosthetic fracture.  Noted by RN to be coughing occasionally with POs on 5/17.  Fever persisting despite five days on ABX.  Chest: clear to auscultation bilaterally, no wheezing rales or rhonchi.  Clinical swallow eval ordered to determine risk of aspiration.       Assessment / Plan / Recommendation Clinical Impression  Dysphagia Diagnosis: Suspected primary esophageal dysphagia;Moderate pharyngeal phase dysphagia Clinical impression: Patient presents with a suspected primary esophageal dysphagia impacting overall pharyngeal functioning. Although oropharyngeal swallow is characterized by intermittently delayed swallow initiation resulting in trace penetration of thin liquids when combined with solids only (pill), patient able to protect airway before and during the swallow with no penetration or aspiration  observed with solids or liquids consumed alone.  Cannot r/o mild base of tongue weakness although favor esophageal component to account for mild-moderate vallecular and trace pyriform sinus residuals post swallow caused by increased esophageal pressure as esophageal sweep revealed what appears to be stasis through out mid-lower esophagus and potential osteophytes were noted to cause trace backflow of bolus into the pharynx.  Episodic aspiration likely based on these results however thin liquids, combined with a mechanical soft diet,  remain appropriate as they are best to assist in esophageal clearance of bolus at this time.  MD, may wish to consider esophageal w/u.     Treatment Recommendation  Therapy as outlined in treatment plan below    Diet Recommendation Dysphagia 3 (Mechanical Soft);Thin liquid   Other  Recommendations Consider esophageal assessment   Follow Up Recommendations  None    Frequency and Duration min 2x/week  2 weeks   Pertinent Vitals/Pain n/a    SLP Swallow Goals Patient will consume recommended diet without observed clinical signs of aspiration with: Minimal assistance Swallow Study Goal #1 - Progress: Progressing toward goal Patient will utilize recommended strategies during swallow to increase swallowing safety with: Minimal assistance Swallow Study Goal #2 - Progress: Progressing toward goal   General HPI: 78 year-old male with known history of prostate cancer status post radiation and is on suprapubic catheter, diabetes mellitus type 2, CAD status post CABG, atrial fibrillation on Coumadin, admitted with complicated UTI, right hip pain due to periprosthetic fracture.  Noted by RN to be coughing occasionally with POs on 5/17.  Fever persisting despite five days on ABX.  Chest: clear to auscultation bilaterally, no wheezing rales or rhonchi.  Clinical swallow eval ordered to determine risk of aspiration.   Type of Study: Modified Barium Swallowing Study Previous  Swallow Assessment: Bedside swallow evaluation complete 5/21 indicated s/s of an esophageal dysphagia wtih recommendations for thin liquids.  Diet Prior to this Study: Regular;Thin liquids Temperature Spikes Noted: Yes Respiratory Status: Supplemental O2 delivered via (comment) (2 L nasal cannula) Behavior/Cognition: Alert;Cooperative;Pleasant mood Oral Cavity - Dentition: Dentures, top;Dentures, bottom Oral Motor / Sensory Function: Within functional limits Vision: Functional for self-feeding Patient Positioning: Upright in chair Baseline Vocal Quality: Clear Volitional Cough: Strong Volitional Swallow: Able to elicit Anatomy: Other (Comment) (possible small osteophyte C5-6; MD not present to confirm) Pharyngeal Secretions: Not observed secondary MBS    Reason for Referral Objectively evaluate swallowing function.            Ferdinand Lango MA, CCC-SLP [phone]  [name] 10/31/2011, 10:17 AM

## 2011-10-31 NOTE — Progress Notes (Signed)
  Echocardiogram 2D Echocardiogram has been performed.  Alvilda Mckenna, Real Cons 10/31/2011, 3:44 PM

## 2011-10-31 NOTE — Progress Notes (Signed)
DAILY PROGRESS NOTE                              [facility]AL [facility]  SUBJECTIVE: Awake, alert and oriented.febrile yesterday evening  OBJECTIVE: BP 106/70  Pulse 94  Temp(Src) 98.7 F (37.1 C) (Oral)  Resp 20  Ht 5\' 9"  (1.753 m)  Wt 70.4 kg (155 lb 3.3 oz)  BMI 22.92 kg/m2  SpO2 93%  Intake/Output Summary (Last 24 hours) at 10/31/11 1202 Last data filed at 10/31/11 0859  Gross per 24 hour  Intake    582 ml  Output   2427 ml  Net  -1845 ml                      Weight change:  Physical Exam: General: Alert, disoriented not in any acute distress. HEENT: anicteric sclera, pupils equal reactive to light and accommodation CVS: S1-S2 heard, no murmur rubs or gallops Chest: clear to auscultation bilaterally, bibasilar rales Abdomen:  normal bowel sounds, soft, nontender, nondistended, no organomegaly Neuro: Cranial nerves II-XII intact, no focal neurological deficits Extremities: no cyanosis, no clubbing or edema noted bilaterally Skin-maculopapular rash-right lateral thing and back-today spreading to ant abd and neck as well   Lab Results:  Basename 10/31/11 0400 10/30/11 0550  NA 135 135  K 3.3* 3.3*  CL 97 99  CO2 27 26  GLUCOSE 128* 149*  BUN 14 14  CREATININE 0.92 0.81  CALCIUM 8.4 8.2*  MG -- --  PHOS -- --   No results found for this basename: AST:2,ALT:2,ALKPHOS:2,BILITOT:2,PROT:2,ALBUMIN:2 in the last 72 hours No results found for this basename: LIPASE:2,AMYLASE:2 in the last 72 hours  Basename 10/29/11 0550  WBC 11.7*  NEUTROABS --  HGB 9.4*  HCT 27.9*  MCV 94.9  PLT 193   No results found for this basename: CKTOTAL:3,CKMB:3,CKMBINDEX:3,TROPONINI:3 in the last 72 hours No components found with this basename: POCBNP:3 No results found for this basename: DDIMER:2 in the last 72 hours No results found for this basename: HGBA1C:2 in the last 72 hours No results found for this basename:  CHOL:2,HDL:2,LDLCALC:2,TRIG:2,CHOLHDL:2,LDLDIRECT:2 in the last 72 hours No results found for this basename: TSH,T4TOTAL,FREET3,T3FREE,THYROIDAB in the last 72 hours No results found for this basename: VITAMINB12:2,FOLATE:2,FERRITIN:2,TIBC:2,IRON:2,RETICCTPCT:2 in the last 72 hours  Micro Results: Recent Results (from the past 240 hour(s))  CULTURE, BLOOD (ROUTINE X 2)     Status: Normal   Collection Time   10/23/11  8:49 PM      Component Value Range Status Comment   Specimen Description BLOOD HAND RIGHT   Final    Special Requests BOTTLES DRAWN AEROBIC ONLY 10CC   Final    Culture  Setup Time [id number]   Final    Culture NO GROWTH 5 DAYS   Final    Report Status 10/30/2011 FINAL   Final   CULTURE, BLOOD (ROUTINE X 2)     Status: Normal   Collection Time   10/23/11  8:57 PM      Component Value Range Status Comment   Specimen Description BLOOD HAND LEFT   Final    Special Requests BOTTLES DRAWN AEROBIC ONLY [facility]   Final    Culture  Setup Time [id number]   Final    Culture NO GROWTH 5 DAYS   Final    Report Status 10/30/2011 FINAL   Final   GRAM STAIN     Status: Normal   Collection  Time   10/23/11  9:36 PM      Component Value Range Status Comment   Specimen Description URINE, CATHETERIZED   Final    Special Requests NONE   Final    Gram Stain     Final    Value: CYTOSPIN SLIDE     WBC PRESENT,BOTH PMN AND MONONUCLEAR     GRAM NEGATIVE RODS   Report Status 10/23/2011 FINAL   Final   URINE CULTURE     Status: Normal   Collection Time   10/23/11  9:37 PM      Component Value Range Status Comment   Specimen Description URINE, CATHETERIZED   Final    Special Requests NONE   Final    Culture  Setup Time [id number]   Final    Colony Count >=100,000 COLONIES/ML   Final    Culture     Final    Value: Multiple bacterial morphotypes present, none predominant. Suggest appropriate recollection if clinically indicated.   Report Status 10/24/2011 FINAL   Final   MRSA PCR SCREENING      Status: Normal   Collection Time   10/24/11  3:00 AM      Component Value Range Status Comment   MRSA by PCR NEGATIVE  NEGATIVE  Final   URINE CULTURE     Status: Normal   Collection Time   10/25/11  7:33 PM      Component Value Range Status Comment   Specimen Description URINE, SUPRAPUBIC   Final    Special Requests NONE   Final    Culture  Setup Time [id number]   Final    Colony Count NO GROWTH   Final    Culture NO GROWTH   Final    Report Status 10/27/2011 FINAL   Final   CULTURE, BLOOD (ROUTINE X 2)     Status: Normal (Preliminary result)   Collection Time   10/29/11  4:10 PM      Component Value Range Status Comment   Specimen Description BLOOD HAND RIGHT   Final    Special Requests BOTTLES DRAWN AEROBIC ONLY 10CC   Final    Culture  Setup Time [id number]   Final    Culture     Final    Value:        BLOOD CULTURE RECEIVED NO GROWTH TO DATE CULTURE WILL BE HELD FOR 5 DAYS BEFORE ISSUING A FINAL NEGATIVE REPORT   Report Status PENDING   Incomplete   URINE CULTURE     Status: Normal   Collection Time   10/29/11  6:42 PM      Component Value Range Status Comment   Specimen Description URINE, CATHETERIZED   Final    Special Requests NONE   Final    Culture  Setup Time [id number]   Final    Colony Count NO GROWTH   Final    Culture NO GROWTH   Final    Report Status 10/30/2011 FINAL   Final     Studies/Results: Dg Chest 2 View  10/23/2011  *RADIOLOGY REPORT*  Clinical Data: Fatigue.  Fever.  Altered level of consciousness.  CHEST - 2 VIEW  Comparison: 05/09/2009  Findings: Prior CABG noted.  Mild cardiomegaly is present with indistinct pulmonary vasculature and cephalization of blood flow suggesting pulmonary venous hypertension.  Overt edema is not observed.  Linear subsegmental atelectasis noted at the left lung base.  Atherosclerotic calcification of the aortic arch is noted.  No pleural effusion is  identified.  IMPRESSION:  1.  Cardiomegaly with pulmonary venous  hypertension.  No overt edema. 2.  Atherosclerosis.  Original Report Authenticated By: Dellia Cloud, M.D.   Dg Hip Complete Right  10/24/2011  *RADIOLOGY REPORT*  Clinical Data: Right hip pain.  Remote hip surgery.  RIGHT HIP - COMPLETE 2+ VIEW  Comparison: 05/10/2009.  Findings: Interval protrusio of the right hip prosthesis with areas of lucency and sclerosis around the acetabular cup.  The acetabular cup has rotated into a more vertical position.  The femoral component is intact.  No findings for loosening or fracture.  There appear to be pathologic or stress fractures involving the obturator ring.  Inferior pubic ramus fracture noted and probable fracture at the ischiopubic junction. CT recommended for further evaluation and preoperative planning.  IMPRESSION:  1.  Protrusio and rotation of the acetabular cup. 2.  Pathologic /stress fractures involving the obturator ring. Cannot exclude underlying particle disease or infection.  CT recommended for further evaluation.  Original Report Authenticated By: P. Loralie Champagne, M.D.   Medications: Scheduled Meds:    . amitriptyline  50 mg Oral QHS  . atorvastatin  80 mg Oral Daily  . digoxin  125 mcg Oral Daily  . feeding supplement  237 mL Oral TID BM  . furosemide  40 mg Intravenous Once  . gabapentin  300 mg Oral TID  . insulin aspart  0-9 Units Subcutaneous TID WC  . metoprolol succinate  100 mg Oral BID  . omega-3 acid ethyl esters  1 g Oral Q breakfast  . omeprazole  20 mg Oral Q1200  . potassium chloride  40 mEq Oral Once  . ramipril  10 mg Oral Daily  . sodium chloride  3 mL Intravenous Q12H  . Warfarin - Pharmacist Dosing Inpatient   Does not apply q1800  . DISCONTD: levofloxacin  500 mg Oral Q24H   Continuous Infusions:   PRN Meds:.acetaminophen, acetaminophen, albuterol, guaiFENesin, morphine injection, ondansetron (ZOFRAN) IV, ondansetron, oxyCODONE-acetaminophen, sodium chloride  ASSESSMENT & PLAN: Principal Problem:   *UTI (lower urinary tract infection) Active Problems:   Interval history 79 year old Caucasian male with past medical history of diabetes, prostate cancer status post indwelling suprapubic catheter. Patient admitted to the hospital with UTI. Patient initially started on Zosyn after the urine and blood cultures obtained. Patient was still spiking fever after 24 hours and vancomycin was added after wife reported that he had history of staph UTI. Patient also has right hip pain turned out to be periprosthetic fracture. Patient was seen by orthopedics and they want to evaluate him after the infection cleared for surgery, this will be done as outpatient.  Still spiking fever-seen by ID-Vanco/Zosyn discontinued-started on Levaquin,  UTI -Complicated, suprapubic catheter related UTI. -Catheter was changed in the emergency department at the time of admission. -Was spiking fever on Zosyn, vancomycin added as patient had history of staph UTI. -Blood cultures obtained, repeat urine culture as the current culture showing multiple morphotypes. -Seen by ID on 5/21-recommended transition to Levaquin, had fever yesterday-awaiting ID Follow up for further recommendation -because of worsening rash-Levaquin stopped  Chronic pain and weakness in the right hip -X-ray showed protrusion and migration of the acetabular cup -Stress fracture of the obturator ring. -Orthopedics recommended to followup with him as outpatient, he'll likely need surgery.  Acute Pulmonary Edema -Mild pulmonary edema, IV fluids stopped already -this is likely acute CHF 2/2 to underlying diastolic dysfunction worsened by IVF  -Patient started on IV Lasix, had over 5  L urine output yesterday.Received Lasix on 5/21 and 5/22  -Patient feels much better, shortness of breath almost resolved-however CXR on 5/22 still shows congestion -check 2 D echo, and repeat CXR in am -start low dose oral lasix  Maculopapular rash -?antibiotic-from  Levaquin -stop Levaquin-as already completed more than 7 days of treatment -await ID input  Diabetes mellitus type 2 -Controlled with hemoglobin of 6.9. -Continue preadmission medication, utilize SSI and carb modified diet while in the hospital.  Atrial fibrillation -Rate controlled.Continue with Digoxin and metoprolol -On chronic Coumadin, pharmacy is to dose of Coumadin.  HTN -continue with Metoprolol and Ramipril  History of prostate cancer -Patient had previous treatment. PSA is suppressed.  Disposition -SNF in 1-2 days.   LOS: 8 days   [facility] 10/31/2011, 12:02 PM

## 2011-11-01 DIAGNOSIS — R509 Fever, unspecified: Secondary | ICD-10-CM

## 2011-11-01 DIAGNOSIS — S72009A Fracture of unspecified part of neck of unspecified femur, initial encounter for closed fracture: Secondary | ICD-10-CM

## 2011-11-01 DIAGNOSIS — N390 Urinary tract infection, site not specified: Secondary | ICD-10-CM

## 2011-11-01 DIAGNOSIS — L270 Generalized skin eruption due to drugs and medicaments taken internally: Secondary | ICD-10-CM

## 2011-11-01 DIAGNOSIS — I4891 Unspecified atrial fibrillation: Secondary | ICD-10-CM

## 2011-11-01 LAB — CBC
Hemoglobin: 10.1 g/dL — ABNORMAL LOW (ref 13.0–17.0)
Platelets: 303 10*3/uL (ref 150–400)
RBC: 3.14 MIL/uL — ABNORMAL LOW (ref 4.22–5.81)
WBC: 19.2 10*3/uL — ABNORMAL HIGH (ref 4.0–10.5)

## 2011-11-01 LAB — PROTIME-INR: Prothrombin Time: 31.3 seconds — ABNORMAL HIGH (ref 11.6–15.2)

## 2011-11-01 LAB — GLUCOSE, CAPILLARY
Glucose-Capillary: 155 mg/dL — ABNORMAL HIGH (ref 70–99)
Glucose-Capillary: 193 mg/dL — ABNORMAL HIGH (ref 70–99)

## 2011-11-01 MED ORDER — WARFARIN SODIUM 1 MG PO TABS
1.5000 mg | ORAL_TABLET | Freq: Once | ORAL | Status: AC
  Administered 2011-11-01: 1.5 mg via ORAL
  Filled 2011-11-01: qty 1

## 2011-11-01 MED ORDER — TRIAMCINOLONE ACETONIDE 0.1 % EX OINT
TOPICAL_OINTMENT | Freq: Two times a day (BID) | CUTANEOUS | Status: DC
  Administered 2011-11-01 – 2011-11-05 (×9): via TOPICAL
  Filled 2011-11-01 (×5): qty 15

## 2011-11-01 MED ORDER — DIPHENHYDRAMINE HCL 25 MG PO CAPS
25.0000 mg | ORAL_CAPSULE | Freq: Every day | ORAL | Status: DC
  Administered 2011-11-01 – 2011-11-04 (×4): 25 mg via ORAL
  Filled 2011-11-01 (×6): qty 1

## 2011-11-01 NOTE — Progress Notes (Signed)
Pt sating at 95% on room air at rest. Unable to determine ambulation sats.

## 2011-11-01 NOTE — Progress Notes (Signed)
DAILY PROGRESS NOTE                              GENERAL INTERNAL [facility]ICINE [facility]  SUBJECTIVE: Awake, alert and oriented No fevers for past 24 hours, however rash now spreading to neck, and lower face. No mucus membranes involved as off yet  OBJECTIVE: BP 118/68  Pulse 69  Temp(Src) 98.3 F (36.8 C) (Oral)  Resp 18  Ht 5\' 9"  (1.753 m)  Wt 70.4 kg (155 lb 3.3 oz)  BMI 22.92 kg/m2  SpO2 98%  Intake/Output Summary (Last 24 hours) at 11/01/11 1223 Last data filed at 11/01/11 0500  Gross per 24 hour  Intake    600 ml  Output   1058 ml  Net   -458 ml                      Weight change:  Physical Exam: General: Alert, disoriented not in any acute distress. HEENT: anicteric sclera, pupils equal reactive to light and accommodation CVS: S1-S2 heard, no murmur rubs or gallops Chest: clear to auscultation bilaterally, bibasilar rales Abdomen:  normal bowel sounds, soft, nontender, nondistended, no organomegaly Neuro: Cranial nerves II-XII intact, no focal neurological deficits Extremities: no cyanosis, no clubbing or edema noted bilaterally Skin-maculopapular rash-right lateral thing and back-tspreading to ant abd and neck as well-lower part of face involved. No mucus membranes involved. No target lesion   Lab Results:  Basename 10/31/11 0400 10/30/11 0550  NA 135 135  K 3.3* 3.3*  CL 97 99  CO2 27 26  GLUCOSE 128* 149*  BUN 14 14  CREATININE 0.92 0.81  CALCIUM 8.4 8.2*  MG -- --  PHOS -- --   No results found for this basename: AST:2,ALT:2,ALKPHOS:2,BILITOT:2,PROT:2,ALBUMIN:2 in the last 72 hours No results found for this basename: LIPASE:2,AMYLASE:2 in the last 72 hours  Basename 11/01/11 0548  WBC 19.2*  NEUTROABS --  HGB 10.1*  HCT 29.7*  MCV 94.6  PLT 303   No results found for this basename: CKTOTAL:3,CKMB:3,CKMBINDEX:3,TROPONINI:3 in the last 72 hours No components found with this basename: POCBNP:3 No results found for this basename: DDIMER:2 in  the last 72 hours No results found for this basename: HGBA1C:2 in the last 72 hours No results found for this basename: CHOL:2,HDL:2,LDLCALC:2,TRIG:2,CHOLHDL:2,LDLDIRECT:2 in the last 72 hours No results found for this basename: TSH,T4TOTAL,FREET3,T3FREE,THYROIDAB in the last 72 hours No results found for this basename: VITAMINB12:2,FOLATE:2,FERRITIN:2,TIBC:2,IRON:2,RETICCTPCT:2 in the last 72 hours  Micro Results: Recent Results (from the past 240 hour(s))  CULTURE, BLOOD (ROUTINE X 2)     Status: Normal   Collection Time   10/23/11  8:49 PM      Component Value Range Status Comment   Specimen Description BLOOD HAND RIGHT   Final    Special Requests BOTTLES DRAWN AEROBIC ONLY 10CC   Final    Culture  Setup Time [id number]   Final    Culture NO GROWTH 5 DAYS   Final    Report Status 10/30/2011 FINAL   Final   CULTURE, BLOOD (ROUTINE X 2)     Status: Normal   Collection Time   10/23/11  8:57 PM      Component Value Range Status Comment   Specimen Description BLOOD HAND LEFT   Final    Special Requests BOTTLES DRAWN AEROBIC ONLY [facility]   Final    Culture  Setup Time [id number]   Final  Culture NO GROWTH 5 DAYS   Final    Report Status 10/30/2011 FINAL   Final   GRAM STAIN     Status: Normal   Collection Time   10/23/11  9:36 PM      Component Value Range Status Comment   Specimen Description URINE, CATHETERIZED   Final    Special Requests NONE   Final    Gram Stain     Final    Value: CYTOSPIN SLIDE     WBC PRESENT,BOTH PMN AND MONONUCLEAR     GRAM NEGATIVE RODS   Report Status 10/23/2011 FINAL   Final   URINE CULTURE     Status: Normal   Collection Time   10/23/11  9:37 PM      Component Value Range Status Comment   Specimen Description URINE, CATHETERIZED   Final    Special Requests NONE   Final    Culture  Setup Time [id number]   Final    Colony Count >=100,000 COLONIES/ML   Final    Culture     Final    Value: Multiple bacterial morphotypes present, none predominant.  Suggest appropriate recollection if clinically indicated.   Report Status 10/24/2011 FINAL   Final   MRSA PCR SCREENING     Status: Normal   Collection Time   10/24/11  3:00 AM      Component Value Range Status Comment   MRSA by PCR NEGATIVE  NEGATIVE  Final   URINE CULTURE     Status: Normal   Collection Time   10/25/11  7:33 PM      Component Value Range Status Comment   Specimen Description URINE, SUPRAPUBIC   Final    Special Requests NONE   Final    Culture  Setup Time [id number]   Final    Colony Count NO GROWTH   Final    Culture NO GROWTH   Final    Report Status 10/27/2011 FINAL   Final   CULTURE, BLOOD (ROUTINE X 2)     Status: Normal (Preliminary result)   Collection Time   10/29/11  4:10 PM      Component Value Range Status Comment   Specimen Description BLOOD HAND RIGHT   Final    Special Requests BOTTLES DRAWN AEROBIC ONLY 10CC   Final    Culture  Setup Time [id number]   Final    Culture     Final    Value:        BLOOD CULTURE RECEIVED NO GROWTH TO DATE CULTURE WILL BE HELD FOR 5 DAYS BEFORE ISSUING A FINAL NEGATIVE REPORT   Report Status PENDING   Incomplete   URINE CULTURE     Status: Normal   Collection Time   10/29/11  6:42 PM      Component Value Range Status Comment   Specimen Description URINE, CATHETERIZED   Final    Special Requests NONE   Final    Culture  Setup Time [id number]   Final    Colony Count NO GROWTH   Final    Culture NO GROWTH   Final    Report Status 10/30/2011 FINAL   Final     Studies/Results: Dg Chest 2 View  10/23/2011  *RADIOLOGY REPORT*  Clinical Data: Fatigue.  Fever.  Altered level of consciousness.  CHEST - 2 VIEW  Comparison: 05/09/2009  Findings: Prior CABG noted.  Mild cardiomegaly is present with indistinct pulmonary vasculature and cephalization of blood flow suggesting pulmonary venous  hypertension.  Overt edema is not observed.  Linear subsegmental atelectasis noted at the left lung base.  Atherosclerotic calcification  of the aortic arch is noted.  No pleural effusion is identified.  IMPRESSION:  1.  Cardiomegaly with pulmonary venous hypertension.  No overt edema. 2.  Atherosclerosis.  Original Report Authenticated By: Dellia Cloud, M.D.   Dg Hip Complete Right  10/24/2011  *RADIOLOGY REPORT*  Clinical Data: Right hip pain.  Remote hip surgery.  RIGHT HIP - COMPLETE 2+ VIEW  Comparison: 05/10/2009.  Findings: Interval protrusio of the right hip prosthesis with areas of lucency and sclerosis around the acetabular cup.  The acetabular cup has rotated into a more vertical position.  The femoral component is intact.  No findings for loosening or fracture.  There appear to be pathologic or stress fractures involving the obturator ring.  Inferior pubic ramus fracture noted and probable fracture at the ischiopubic junction. CT recommended for further evaluation and preoperative planning.  IMPRESSION:  1.  Protrusio and rotation of the acetabular cup. 2.  Pathologic /stress fractures involving the obturator ring. Cannot exclude underlying particle disease or infection.  CT recommended for further evaluation.  Original Report Authenticated By: P. Loralie Champagne, M.D.   Medications: Scheduled Meds:    . amitriptyline  50 mg Oral QHS  . atorvastatin  80 mg Oral Daily  . digoxin  125 mcg Oral Daily  . diphenhydrAMINE  25 mg Oral Q2000  . feeding supplement  237 mL Oral TID BM  . furosemide  20 mg Oral Daily  . gabapentin  300 mg Oral TID  . insulin aspart  0-9 Units Subcutaneous TID WC  . metoprolol succinate  100 mg Oral BID  . omega-3 acid ethyl esters  1 g Oral Q breakfast  . predniSONE  40 mg Oral Q breakfast  . ramipril  10 mg Oral Daily  . sodium chloride  3 mL Intravenous Q12H  . triamcinolone ointment   Topical BID  . warfarin  1.5 mg Oral ONCE-1800  . Warfarin - Pharmacist Dosing Inpatient   Does not apply q1800  . DISCONTD: omeprazole  20 mg Oral Q1200   Continuous Infusions:   PRN  Meds:.acetaminophen, acetaminophen, albuterol, guaiFENesin, morphine injection, ondansetron (ZOFRAN) IV, ondansetron, oxyCODONE-acetaminophen, sodium chloride  ASSESSMENT & PLAN: Principal Problem:  *UTI (lower urinary tract infection) Active Problems:   Interval history 78 year old Caucasian male with past medical history of diabetes, prostate cancer status post indwelling suprapubic catheter. Patient admitted to the hospital with UTI. Patient initially started on Zosyn after the urine and blood cultures obtained. Patient was still spiking fever after 24 hours and vancomycin was added after wife reported that he had history of staph UTI. Patient also has right hip pain turned out to be periprosthetic fracture. Patient was seen by orthopedics and they want to evaluate him after the infection cleared for surgery, this will be done as outpatient.  Still spiking fever-seen by ID-Vanco/Zosyn discontinued-started on Levaquin,  UTI -Complicated, suprapubic catheter related UTI. -Catheter was changed in the emergency department at the time of admission. -Was spiking fever on Zosyn, vancomycin added as patient had history of staph UTI. -Blood cultures obtained, repeat urine culture as the current culture showing multiple morphotypes. -Seen by ID on 5/21-recommended transition to Levaquin, had fever yesterday-awaiting ID Follow up for further recommendation -because of worsening rash-Levaquin stopped on 5/23 -currently not on any further anitbiotics-leukocytosis-today-likely 2/2 prednisone  Chronic pain and weakness in the right hip -X-ray showed protrusion  and migration of the acetabular cup -Stress fracture of the obturator ring. -Orthopedics recommended to followup with him as outpatient, he'll likely need surgery.  Acute Pulmonary Edema -Mild pulmonary edema, IV fluids stopped already -this is likely acute CHF 2/2 to underlying diastolic dysfunction worsened by IVF  -Patient given IV Lasix  on  5/21 and 5/22  -Patient feels much better, shortness of breath almost resolved-however CXR on 5/22 still shows congestion - 2 D echo reviewed, continue with low dose lasix  Maculopapular rash -?antibiotic-from Levaquin -stop Levaquin-as already completed more than 7 days of treatment -started on prednisone-5/23 -spoke with Dr Rutherford Limerick on call-clinical and labs discussed-likely drug rash-she recommended to continue with steroids, add benadryl and topical triamcinolone. Likely needs to run its course, to call back Derm if target lesions develop or mucus membranes get involved  Diabetes mellitus type 2 -Controlled with hemoglobin of 6.9. -Continue preadmission medication, utilize SSI and carb modified diet while in the hospital.  Atrial fibrillation -Rate controlled.Continue with Digoxin and metoprolol -On chronic Coumadin, pharmacy is to dose of Coumadin.  HTN -continue with Metoprolol and Ramipril  History of prostate cancer -Patient had previous treatment. PSA is suppressed.  Disposition -SNF when rash better   LOS: 9 days   [facility] 11/01/2011, 12:23 PM

## 2011-11-01 NOTE — Progress Notes (Signed)
Speech Language Pathology Dysphagia Treatment Patient Details Name: [name] MRN: [id number] DOB: [birth year] Today's Date: 11/01/2011 Time: 0845-0900 SLP Time Calculation (min): 15 min  Assessment / Plan / Recommendation Clinical Impression  Treatment focused on patient and caregiver education. Wife present and supportive, able to verbalize compensatory strategies, aspiration precautions, and diet recommendations provided after MBS 5/23 without cueing, patient continues to require moderate questioning cues. Wife will be providing assist after d/c. SLP provided additional education regarding esophageal precautions including intake of 5-6 smaller meals per day to decrease risk of reflux related aspiration. SLP will continue to f/u for further patient education.     Diet Recommendation  Continue with Current Diet: Thin liquid;Dysphagia 3 (mechanical soft)    SLP Plan Continue with current plan of care   Pertinent Vitals/Pain n/a   Swallowing Goals  SLP Swallowing Goals Patient will consume recommended diet without observed clinical signs of aspiration with: Minimal assistance Swallow Study Goal #1 - Progress: Progressing toward goal Patient will utilize recommended strategies during swallow to increase swallowing safety with: Minimal assistance Swallow Study Goal #2 - Progress: Progressing toward goal  General Temperature Spikes Noted: No Respiratory Status: Supplemental O2 delivered via (comment) Behavior/Cognition: Alert;Cooperative;Pleasant mood Oral Cavity - Dentition: Dentures, top;Dentures, bottom Patient Positioning: Partially reclined      Dysphagia Treatment Treatment focused on: Patient/family/caregiver education Patient observed directly with PO's: No Reason PO's not observed: Other (comment) (just finished breakfast) Type of cueing: Verbal Amount of cueing: Moderate   Sean Napoli MA, CCC-SLP [phone]  [name] 11/01/2011, 9:35 AM

## 2011-11-01 NOTE — Progress Notes (Signed)
INFECTIOUS DISEASE PROGRESS NOTE  ID: [name] is a 78 y.o. male with  Principal Problem:  *UTI (lower urinary tract infection) Active Problems:  DIABETES MELLITUS, TYPE II  CAD, ARTERY BYPASS GRAFT  Atrial fibrillation  Fever  Subjective: C/o rash. No dysphagia, sob, cough.   Abtx:  Anti-infectives     Start     Dose/Rate Route Frequency Ordered Stop   10/29/11 1600   levofloxacin (LEVAQUIN) tablet 500 mg  Status:  Discontinued        500 mg Oral Every 24 hours 10/29/11 1543 10/31/11 0755   10/28/11 1200   vancomycin (VANCOCIN) IVPB 1000 mg/200 mL premix  Status:  Discontinued        1,000 mg 200 mL/hr over 60 Minutes Intravenous Every 12 hours 10/28/11 1140 10/30/11 0115   10/24/11 2300   vancomycin (VANCOCIN) 750 mg in sodium chloride 0.9 % 150 mL IVPB  Status:  Discontinued        750 mg 150 mL/hr over 60 Minutes Intravenous Every 12 hours 10/24/11 1218 10/28/11 1139   10/24/11 1230   vancomycin (VANCOCIN) 750 mg in sodium chloride 0.9 % 150 mL IVPB        750 mg 150 mL/hr over 60 Minutes Intravenous  Once 10/24/11 1218 10/24/11 1522   10/24/11 0800   piperacillin-tazobactam (ZOSYN) IVPB 3.375 g  Status:  Discontinued        3.375 g 12.5 mL/hr over 240 Minutes Intravenous Every 8 hours 10/24/11 0244 10/30/11 0115   10/24/11 0245  piperacillin-tazobactam (ZOSYN) IVPB 3.375 g       3.375 g 100 mL/hr over 30 Minutes Intravenous  Once 10/24/11 0244 10/24/11 0407   10/23/11 2300   cefTRIAXone (ROCEPHIN) 1 g in dextrose 5 % 50 mL IVPB        1 g 100 mL/hr over 30 Minutes Intravenous  Once 10/23/11 2247 10/23/11 2329          Medications:  Scheduled:   . amitriptyline  50 mg Oral QHS  . atorvastatin  80 mg Oral Daily  . digoxin  125 mcg Oral Daily  . diphenhydrAMINE  25 mg Oral Q2000  . feeding supplement  237 mL Oral TID BM  . furosemide  20 mg Oral Daily  . gabapentin  300 mg Oral TID  . insulin aspart  0-9 Units Subcutaneous TID WC  . metoprolol  succinate  100 mg Oral BID  . omega-3 acid ethyl esters  1 g Oral Q breakfast  . predniSONE  40 mg Oral Q breakfast  . ramipril  10 mg Oral Daily  . sodium chloride  3 mL Intravenous Q12H  . triamcinolone ointment   Topical BID  . warfarin  1.5 mg Oral ONCE-1800  . Warfarin - Pharmacist Dosing Inpatient   Does not apply q1800  . DISCONTD: omeprazole  20 mg Oral Q1200    Objective: Vital signs in last 24 hours: Temp:  [98.2 F (36.8 C)-98.9 F (37.2 C)] 98.2 F (36.8 C) (05/24 1500) Pulse Rate:  [69-80] 70  (05/24 1500) Resp:  [16-18] 18  (05/24 1500) BP: (105-120)/(55-70) 120/70 mmHg (05/24 1500) SpO2:  [97 %-98 %] 97 % (05/24 1500)   General appearance: alert, cooperative and no distress Throat: no ulcers or erythema Resp: clear to auscultation bilaterally Cardio: regular rate and rhythm GI: normal findings: bowel sounds normal and soft, non-tender Skin: mild decrease in macular rash intensity  Lab Results  Basename 11/01/11 0548 10/31/11 0400 10/30/11 0550  WBC 19.2* -- --  HGB 10.1* -- --  HCT 29.7* -- --  NA -- 135 135  K -- 3.3* 3.3*  CL -- 97 99  CO2 -- 27 26  BUN -- 14 14  CREATININE -- 0.92 0.81  GLU -- -- --   Liver Panel No results found for this basename: PROT:2,ALBUMIN:2,AST:2,ALT:2,ALKPHOS:2,BILITOT:2,BILIDIR:2,IBILI:2 in the last 72 hours Sedimentation Rate No results found for this basename: ESRSEDRATE in the last 72 hours C-Reactive Protein No results found for this basename: CRP:2 in the last 72 hours  Microbiology: Recent Results (from the past 240 hour(s))  CULTURE, BLOOD (ROUTINE X 2)     Status: Normal   Collection Time   10/23/11  8:49 PM      Component Value Range Status Comment   Specimen Description BLOOD HAND RIGHT   Final    Special Requests BOTTLES DRAWN AEROBIC ONLY 10CC   Final    Culture  Setup Time [id number]   Final    Culture NO GROWTH 5 DAYS   Final    Report Status 10/30/2011 FINAL   Final   CULTURE, BLOOD (ROUTINE X  2)     Status: Normal   Collection Time   10/23/11  8:57 PM      Component Value Range Status Comment   Specimen Description BLOOD HAND LEFT   Final    Special Requests BOTTLES DRAWN AEROBIC ONLY [facility]   Final    Culture  Setup Time [id number]   Final    Culture NO GROWTH 5 DAYS   Final    Report Status 10/30/2011 FINAL   Final   GRAM STAIN     Status: Normal   Collection Time   10/23/11  9:36 PM      Component Value Range Status Comment   Specimen Description URINE, CATHETERIZED   Final    Special Requests NONE   Final    Gram Stain     Final    Value: CYTOSPIN SLIDE     WBC PRESENT,BOTH PMN AND MONONUCLEAR     GRAM NEGATIVE RODS   Report Status 10/23/2011 FINAL   Final   URINE CULTURE     Status: Normal   Collection Time   10/23/11  9:37 PM      Component Value Range Status Comment   Specimen Description URINE, CATHETERIZED   Final    Special Requests NONE   Final    Culture  Setup Time [id number]   Final    Colony Count >=100,000 COLONIES/ML   Final    Culture     Final    Value: Multiple bacterial morphotypes present, none predominant. Suggest appropriate recollection if clinically indicated.   Report Status 10/24/2011 FINAL   Final   MRSA PCR SCREENING     Status: Normal   Collection Time   10/24/11  3:00 AM      Component Value Range Status Comment   MRSA by PCR NEGATIVE  NEGATIVE  Final   URINE CULTURE     Status: Normal   Collection Time   10/25/11  7:33 PM      Component Value Range Status Comment   Specimen Description URINE, SUPRAPUBIC   Final    Special Requests NONE   Final    Culture  Setup Time [id number]   Final    Colony Count NO GROWTH   Final    Culture NO GROWTH   Final    Report Status 10/27/2011 FINAL   Final  CULTURE, BLOOD (ROUTINE X 2)     Status: Normal (Preliminary result)   Collection Time   10/29/11  4:10 PM      Component Value Range Status Comment   Specimen Description BLOOD HAND RIGHT   Final    Special Requests BOTTLES DRAWN AEROBIC  ONLY 10CC   Final    Culture  Setup Time [id number]   Final    Culture     Final    Value:        BLOOD CULTURE RECEIVED NO GROWTH TO DATE CULTURE WILL BE HELD FOR 5 DAYS BEFORE ISSUING A FINAL NEGATIVE REPORT   Report Status PENDING   Incomplete   URINE CULTURE     Status: Normal   Collection Time   10/29/11  6:42 PM      Component Value Range Status Comment   Specimen Description URINE, CATHETERIZED   Final    Special Requests NONE   Final    Culture  Setup Time [id number]   Final    Colony Count NO GROWTH   Final    Culture NO GROWTH   Final    Report Status 10/30/2011 FINAL   Final     Studies/Results: Dg Swallowing Func-no Report  10/31/2011  CLINICAL DATA: dysphagia   FLUOROSCOPY FOR SWALLOWING FUNCTION STUDY:  Fluoroscopy was provided for swallowing function study, which was  administered by a speech pathologist.  Final results and recommendations  from this study are contained within the speech pathology report.       Assessment/Plan: Rash, suspected ADR       Rever resolved  Urinary tract infection  Hip Fracture  CHF  Cont to watch Off anbx (24 h now)  Now on prednisone Would stop all medications that are not absolutely necessary. .  This does not appear to be VZV, does not follow dermatomes  Dr Daiva Eves available if questions over w/e.  Johny Sax Infectious Diseases [phone] 11/01/2011, 4:04 PM   LOS: 9 days

## 2011-11-01 NOTE — Consult Note (Signed)
ANTICOAGULATION CONSULT NOTE - Follow Up Consult  Pharmacy Consult for Coumadin Indication: atrial fibrillation  Allergies  Allergen Reactions  . Levaquin (Levofloxacin) Rash  . Codeine Other (See Comments)    unknown  . Penicillins Other (See Comments)    unknown    Patient Measurements: Height: 5\' 9"  (175.3 cm) Weight: 155 lb 3.3 oz (70.4 kg) IBW/kg (Calculated) : 70.7    Vital Signs: Temp: 98.3 F (36.8 C) (05/24 0447) Temp src: Oral (05/24 0447) BP: 118/68 mmHg (05/24 0447) Pulse Rate: 69  (05/24 0447)  Labs:  Basename 11/01/11 0548 10/31/11 0400 10/30/11 0550  HGB 10.1* -- --  HCT 29.7* -- --  PLT 303 -- --  APTT -- -- --  LABPROT 31.3* 34.8* 41.1*  INR 2.96* 3.39* 4.20*  HEPARINUNFRC -- -- --  CREATININE -- 0.92 0.81  CKTOTAL -- -- --  CKMB -- -- --  TROPONINI -- -- --   Estimated Creatinine Clearance: 67 ml/min (by C-G formula based on Cr of 0.92).   Medications:  Prescriptions prior to admission  Medication Sig Dispense Refill  . ACCU-CHEK AVIVA PLUS test strip TEST UP TO TWICE DAILY  100 each  3  . atorvastatin (LIPITOR) 80 MG tablet Take 80 mg by mouth daily.      . Blood Glucose Monitoring Suppl MISC        . digoxin (LANOXIN) 0.125 MG tablet Take 125 mcg by mouth daily.      . fish oil-omega-3 fatty acids 1000 MG capsule Take 1 g by mouth daily.        . metFORMIN (GLUCOPHAGE) 500 MG tablet Take 1 tablet (500 mg total) by mouth 2 (two) times daily with a meal.  60 tablet  3  . metoprolol succinate (TOPROL-XL) 50 MG 24 hr tablet Take 50 mg by mouth daily. Take with or immediately following a meal.      . omeprazole (PRILOSEC OTC) 20 MG tablet Take 20 mg by mouth daily.        . ramipril (ALTACE) 10 MG tablet Take 10 mg by mouth daily.      Marland Kitchen warfarin (COUMADIN) 3 MG tablet Take 1 tablet (3 mg total) by mouth daily.  40 tablet  5  . AMBULATORY NON FORMULARY MEDICATION Medication Name: Shingles vaccine IM x 1  1 vial  0   Scheduled:    .  amitriptyline  50 mg Oral QHS  . atorvastatin  80 mg Oral Daily  . digoxin  125 mcg Oral Daily  . feeding supplement  237 mL Oral TID BM  . furosemide  20 mg Oral Daily  . gabapentin  300 mg Oral TID  . insulin aspart  0-9 Units Subcutaneous TID WC  . metoprolol succinate  100 mg Oral BID  . omega-3 acid ethyl esters  1 g Oral Q breakfast  . predniSONE  40 mg Oral Q breakfast  . ramipril  10 mg Oral Daily  . sodium chloride  3 mL Intravenous Q12H  . Warfarin - Pharmacist Dosing Inpatient   Does not apply q1800  . DISCONTD: omeprazole  20 mg Oral Q1200   Anti-infectives     Start     Dose/Rate Route Frequency Ordered Stop   10/29/11 1600   levofloxacin (LEVAQUIN) tablet 500 mg  Status:  Discontinued        500 mg Oral Every 24 hours 10/29/11 1543 10/31/11 0755   10/28/11 1200   vancomycin (VANCOCIN) IVPB 1000 mg/200 mL premix  Status:  Discontinued        1,000 mg 200 mL/hr over 60 Minutes Intravenous Every 12 hours 10/28/11 1140 10/30/11 0115   10/24/11 2300   vancomycin (VANCOCIN) 750 mg in sodium chloride 0.9 % 150 mL IVPB  Status:  Discontinued        750 mg 150 mL/hr over 60 Minutes Intravenous Every 12 hours 10/24/11 1218 10/28/11 1139   10/24/11 1230   vancomycin (VANCOCIN) 750 mg in sodium chloride 0.9 % 150 mL IVPB        750 mg 150 mL/hr over 60 Minutes Intravenous  Once 10/24/11 1218 10/24/11 1522   10/24/11 0800   piperacillin-tazobactam (ZOSYN) IVPB 3.375 g  Status:  Discontinued        3.375 g 12.5 mL/hr over 240 Minutes Intravenous Every 8 hours 10/24/11 0244 10/30/11 0115   10/24/11 0245  piperacillin-tazobactam (ZOSYN) IVPB 3.375 g       3.375 g 100 mL/hr over 30 Minutes Intravenous  Once 10/24/11 0244 10/24/11 0407   10/23/11 2300   cefTRIAXone (ROCEPHIN) 1 g in dextrose 5 % 50 mL IVPB        1 g 100 mL/hr over 30 Minutes Intravenous  Once 10/23/11 2247 10/23/11 2329          Assessment:  78 y/o male on chronic Coumadin for A-fib.  INR has been  SUPRAtherapeutic but has been on hold the past 3 days.     Levaquin was discontinued 10/30/11.  INR 2.96,  WBC/Hgb/Hct/Plts:  19.2/10.1/29.7/303 (05/24 0548).     No complications noted.  Home dose of Coumadin 3 mg daily.  Goal of Therapy:   INR 2-3   Plan:   Resume Coumadin today.  Will begin conservatively.   Coumadin 1.5 mg today. Daily INR's, CBC.  Keyen Marban, Elisha Headland, Pharm.D. 11/01/2011 9:57 AM

## 2011-11-01 NOTE — Progress Notes (Signed)
Clinical Social Worker continuing to follow for disposition planning. Clinical Social Worker met with pt and pt wife at bedside. Pt and pt wife confirmed plan for [facility] when pt medically stable for discharge. Per MD, pt still experiencing rash and not yet medically stable for discharge. Clinical Social Worker discussed with [facility] who stated that pt can discharge during the weekend to facility if pt becomes medically stable. Clinical Social Worker notified MD and pt and pt wife. Clinical Social Worker to continue to follow and facilitate pt discharge needs when pt medically stable for discharge.  Jacklynn Lewis, MSW, LCSWA  Clinical Social Work [phone]

## 2011-11-01 NOTE — Progress Notes (Signed)
Patients has had around three loose runny brown stools today. Hx of antibiotic use. Doctor notified and gave order for stool culture to rule out C.diff.

## 2011-11-02 DIAGNOSIS — R509 Fever, unspecified: Secondary | ICD-10-CM

## 2011-11-02 DIAGNOSIS — S72009A Fracture of unspecified part of neck of unspecified femur, initial encounter for closed fracture: Secondary | ICD-10-CM

## 2011-11-02 DIAGNOSIS — I4891 Unspecified atrial fibrillation: Secondary | ICD-10-CM

## 2011-11-02 DIAGNOSIS — L270 Generalized skin eruption due to drugs and medicaments taken internally: Secondary | ICD-10-CM

## 2011-11-02 LAB — GLUCOSE, CAPILLARY
Glucose-Capillary: 311 mg/dL — ABNORMAL HIGH (ref 70–99)
Glucose-Capillary: 309 mg/dL — ABNORMAL HIGH (ref 70–99)

## 2011-11-02 LAB — PROTIME-INR: Prothrombin Time: 27.9 seconds — ABNORMAL HIGH (ref 11.6–15.2)

## 2011-11-02 MED ORDER — INSULIN ASPART 100 UNIT/ML ~~LOC~~ SOLN
0.0000 [IU] | Freq: Three times a day (TID) | SUBCUTANEOUS | Status: DC
  Administered 2011-11-03 (×2): 5 [IU] via SUBCUTANEOUS
  Administered 2011-11-03: 2 [IU] via SUBCUTANEOUS
  Administered 2011-11-04: 3 [IU] via SUBCUTANEOUS
  Administered 2011-11-04: 8 [IU] via SUBCUTANEOUS
  Administered 2011-11-04: 2 [IU] via SUBCUTANEOUS
  Administered 2011-11-05: 3 [IU] via SUBCUTANEOUS
  Administered 2011-11-05: 2 [IU] via SUBCUTANEOUS

## 2011-11-02 MED ORDER — WARFARIN SODIUM 1 MG PO TABS
1.5000 mg | ORAL_TABLET | Freq: Once | ORAL | Status: AC
  Administered 2011-11-02: 1.5 mg via ORAL
  Filled 2011-11-02: qty 1

## 2011-11-02 NOTE — Progress Notes (Signed)
ANTICOAGULATION CONSULT NOTE - Follow Up Consult  Pharmacy Consult for Warfarin Indication: atrial fibrillation  Allergies  Allergen Reactions  . Levaquin (Levofloxacin) Rash  . Codeine Other (See Comments)    unknown  . Penicillins Other (See Comments)    unknown    Patient Measurements: Height: 5\' 9"  (175.3 cm) Weight: 155 lb 3.3 oz (70.4 kg) IBW/kg (Calculated) : 70.7   Vital Signs: Temp: 98 F (36.7 C) (05/25 0447) Temp src: Oral (05/25 0447) BP: 148/84 mmHg (05/25 0447) Pulse Rate: 79  (05/25 0447)  Labs:  Basename 11/02/11 0625 11/01/11 0548 10/31/11 0400  HGB -- 10.1* --  HCT -- 29.7* --  PLT -- 303 --  APTT -- -- --  LABPROT 27.9* 31.3* 34.8*  INR 2.56* 2.96* 3.39*  HEPARINUNFRC -- -- --  CREATININE -- -- 0.92  CKTOTAL -- -- --  CKMB -- -- --  TROPONINI -- -- --    Estimated Creatinine Clearance: 67 ml/min (by C-G formula based on Cr of 0.92).  Assessment: 78 y.o. M resumed on warfarin from PTA for history of Afib. Patient found to have increased warfarin sensitivity this admission requiring doses to be held. Warfarin was resumed yesterday evening and INR remains therapeutic this a.m. (INR 2.56 << 2.96, goal of 2-3). Prednisone was started on 5/24 which is known to increase warfarin sensitivity. PTA dose was known to be 3 mg daily. No CBC since 5/24 -- Hgb/Hct/Plt appeared stable at that time. No s/sx of bleeding noted.   Goal of Therapy:  INR 2-3 Monitor platelets by anticoagulation protocol: Yes   Plan:  1. Warfarin 1.5 mg x 1 dose at 1800 today 2. Will continue to monitor for any signs/symptoms of bleeding and will follow up with PT/INR in the a.m.   Georgina Pillion, PharmD, BCPS Clinical Pharmacist Pager: [phone] 11/02/2011 2:37 PM

## 2011-11-02 NOTE — Progress Notes (Signed)
DAILY PROGRESS NOTE                              GENERAL INTERNAL MEDICINE [facility]  SUBJECTIVE: Awake, alert and oriented No fevers for past 48 hours, rash coalescing-but otherwise no further spread. No mucus membranes involved as off yet  OBJECTIVE: BP 148/84  Pulse 79  Temp(Src) 98 F (36.7 C) (Oral)  Resp 20  Ht 5\' 9"  (1.753 m)  Wt 70.4 kg (155 lb 3.3 oz)  BMI 22.92 kg/m2  SpO2 98%  Intake/Output Summary (Last 24 hours) at 11/02/11 1102 Last data filed at 11/02/11 0700  Gross per 24 hour  Intake    360 ml  Output    905 ml  Net   -545 ml                      Weight change:  Physical Exam: General: Alert, disoriented not in any acute distress. HEENT: anicteric sclera, pupils equal reactive to light and accommodation CVS: S1-S2 heard, no murmur rubs or gallops Chest: clear to auscultation bilaterally, bibasilar rales Abdomen:  normal bowel sounds, soft, nontender, nondistended, no organomegaly Neuro: Cranial nerves II-XII intact, no focal neurological deficits Extremities: no cyanosis, no clubbing or edema noted bilaterally Skin-maculopapular rash-right lateral thing and back-tspreading to ant abd and neck as well-lower part of face involved. No mucus membranes involved. No target lesion   Lab Results:  Basename 10/31/11 0400  NA 135  K 3.3*  CL 97  CO2 27  GLUCOSE 128*  BUN 14  CREATININE 0.92  CALCIUM 8.4  MG --  PHOS --   No results found for this basename: AST:2,ALT:2,ALKPHOS:2,BILITOT:2,PROT:2,ALBUMIN:2 in the last 72 hours No results found for this basename: LIPASE:2,AMYLASE:2 in the last 72 hours  Basename 11/01/11 0548  WBC 19.2*  NEUTROABS --  HGB 10.1*  HCT 29.7*  MCV 94.6  PLT 303   No results found for this basename: CKTOTAL:3,CKMB:3,CKMBINDEX:3,TROPONINI:3 in the last 72 hours No components found with this basename: POCBNP:3 No results found for this basename: DDIMER:2 in the last 72 hours No results found for this basename:  HGBA1C:2 in the last 72 hours No results found for this basename: CHOL:2,HDL:2,LDLCALC:2,TRIG:2,CHOLHDL:2,LDLDIRECT:2 in the last 72 hours No results found for this basename: TSH,T4TOTAL,FREET3,T3FREE,THYROIDAB in the last 72 hours No results found for this basename: VITAMINB12:2,FOLATE:2,FERRITIN:2,TIBC:2,IRON:2,RETICCTPCT:2 in the last 72 hours  Micro Results: Recent Results (from the past 240 hour(s))  CULTURE, BLOOD (ROUTINE X 2)     Status: Normal   Collection Time   10/23/11  8:49 PM      Component Value Range Status Comment   Specimen Description BLOOD HAND RIGHT   Final    Special Requests BOTTLES DRAWN AEROBIC ONLY 10CC   Final    Culture  Setup Time [id number]   Final    Culture NO GROWTH 5 DAYS   Final    Report Status 10/30/2011 FINAL   Final   CULTURE, BLOOD (ROUTINE X 2)     Status: Normal   Collection Time   10/23/11  8:57 PM      Component Value Range Status Comment   Specimen Description BLOOD HAND LEFT   Final    Special Requests BOTTLES DRAWN AEROBIC ONLY [facility]   Final    Culture  Setup Time [id number]   Final    Culture NO GROWTH 5 DAYS   Final    Report Status 10/30/2011  FINAL   Final   GRAM STAIN     Status: Normal   Collection Time   10/23/11  9:36 PM      Component Value Range Status Comment   Specimen Description URINE, CATHETERIZED   Final    Special Requests NONE   Final    Gram Stain     Final    Value: CYTOSPIN SLIDE     WBC PRESENT,BOTH PMN AND MONONUCLEAR     GRAM NEGATIVE RODS   Report Status 10/23/2011 FINAL   Final   URINE CULTURE     Status: Normal   Collection Time   10/23/11  9:37 PM      Component Value Range Status Comment   Specimen Description URINE, CATHETERIZED   Final    Special Requests NONE   Final    Culture  Setup Time [id number]   Final    Colony Count >=100,000 COLONIES/ML   Final    Culture     Final    Value: Multiple bacterial morphotypes present, none predominant. Suggest appropriate recollection if clinically  indicated.   Report Status 10/24/2011 FINAL   Final   MRSA PCR SCREENING     Status: Normal   Collection Time   10/24/11  3:00 AM      Component Value Range Status Comment   MRSA by PCR NEGATIVE  NEGATIVE  Final   URINE CULTURE     Status: Normal   Collection Time   10/25/11  7:33 PM      Component Value Range Status Comment   Specimen Description URINE, SUPRAPUBIC   Final    Special Requests NONE   Final    Culture  Setup Time [id number]   Final    Colony Count NO GROWTH   Final    Culture NO GROWTH   Final    Report Status 10/27/2011 FINAL   Final   CULTURE, BLOOD (ROUTINE X 2)     Status: Normal (Preliminary result)   Collection Time   10/29/11  4:10 PM      Component Value Range Status Comment   Specimen Description BLOOD HAND RIGHT   Final    Special Requests BOTTLES DRAWN AEROBIC ONLY 10CC   Final    Culture  Setup Time [id number]   Final    Culture     Final    Value:        BLOOD CULTURE RECEIVED NO GROWTH TO DATE CULTURE WILL BE HELD FOR 5 DAYS BEFORE ISSUING A FINAL NEGATIVE REPORT   Report Status PENDING   Incomplete   URINE CULTURE     Status: Normal   Collection Time   10/29/11  6:42 PM      Component Value Range Status Comment   Specimen Description URINE, CATHETERIZED   Final    Special Requests NONE   Final    Culture  Setup Time [id number]   Final    Colony Count NO GROWTH   Final    Culture NO GROWTH   Final    Report Status 10/30/2011 FINAL   Final   CLOSTRIDIUM DIFFICILE BY PCR     Status: Normal   Collection Time   11/01/11  5:41 PM      Component Value Range Status Comment   C difficile by pcr NEGATIVE  NEGATIVE  Final     Studies/Results: Dg Chest 2 View  10/23/2011  *RADIOLOGY REPORT*  Clinical Data: Fatigue.  Fever.  Altered level of consciousness.  CHEST - 2 VIEW  Comparison: 05/09/2009  Findings: Prior CABG noted.  Mild cardiomegaly is present with indistinct pulmonary vasculature and cephalization of blood flow suggesting pulmonary venous  hypertension.  Overt edema is not observed.  Linear subsegmental atelectasis noted at the left lung base.  Atherosclerotic calcification of the aortic arch is noted.  No pleural effusion is identified.  IMPRESSION:  1.  Cardiomegaly with pulmonary venous hypertension.  No overt edema. 2.  Atherosclerosis.  Original Report Authenticated By: Dellia Cloud, M.D.   Dg Hip Complete Right  10/24/2011  *RADIOLOGY REPORT*  Clinical Data: Right hip pain.  Remote hip surgery.  RIGHT HIP - COMPLETE 2+ VIEW  Comparison: 05/10/2009.  Findings: Interval protrusio of the right hip prosthesis with areas of lucency and sclerosis around the acetabular cup.  The acetabular cup has rotated into a more vertical position.  The femoral component is intact.  No findings for loosening or fracture.  There appear to be pathologic or stress fractures involving the obturator ring.  Inferior pubic ramus fracture noted and probable fracture at the ischiopubic junction. CT recommended for further evaluation and preoperative planning.  IMPRESSION:  1.  Protrusio and rotation of the acetabular cup. 2.  Pathologic /stress fractures involving the obturator ring. Cannot exclude underlying particle disease or infection.  CT recommended for further evaluation.  Original Report Authenticated By: P. Loralie Champagne, M.D.   Medications: Scheduled Meds:    . atorvastatin  80 mg Oral Daily  . digoxin  125 mcg Oral Daily  . diphenhydrAMINE  25 mg Oral Q2000  . feeding supplement  237 mL Oral TID BM  . gabapentin  300 mg Oral TID  . insulin aspart  0-9 Units Subcutaneous TID WC  . metoprolol succinate  100 mg Oral BID  . predniSONE  40 mg Oral Q breakfast  . ramipril  10 mg Oral Daily  . sodium chloride  3 mL Intravenous Q12H  . triamcinolone ointment   Topical BID  . warfarin  1.5 mg Oral ONCE-1800  . Warfarin - Pharmacist Dosing Inpatient   Does not apply q1800  . DISCONTD: amitriptyline  50 mg Oral QHS  . DISCONTD: furosemide  20  mg Oral Daily  . DISCONTD: omega-3 acid ethyl esters  1 g Oral Q breakfast   Continuous Infusions:   PRN Meds:.acetaminophen, acetaminophen, albuterol, guaiFENesin, morphine injection, ondansetron (ZOFRAN) IV, ondansetron, oxyCODONE-acetaminophen, sodium chloride  ASSESSMENT & PLAN: Principal Problem:  *UTI (lower urinary tract infection) Active Problems:   Interval history 78 year old Caucasian male with past medical history of diabetes, prostate cancer status post indwelling suprapubic catheter. Patient admitted to the hospital with UTI. Patient initially started on Zosyn after the urine and blood cultures obtained. Patient was still spiking fever after 24 hours and vancomycin was added after wife reported that he had history of staph UTI. Patient also has right hip pain turned out to be periprosthetic fracture. Patient was seen by orthopedics and they want to evaluate him after the infection cleared for surgery, this will be done as outpatient.  Still spiking fever-seen by ID-Vanco/Zosyn discontinued-started on Levaquin,  UTI -Complicated, suprapubic catheter related UTI. -Catheter was changed in the emergency department at the time of admission. -Was spiking fever while on Zosyn, vancomycin added as patient had history of staph UTI. -Blood cultures obtained, repeat urine culture as the current culture showing multiple morphotypes. -Seen by ID on 5/21-recommended transition to Levaquin, -however because of rash and the fact that patient completed 7 days  of abx-all antibitoics now stopped -because of worsening rash-Levaquin stopped on 5/23 -currently not on any further anitbiotics-leukocytosis-today-likely 2/2 prednisone-afebrile for 48 hours now  Chronic pain and weakness in the right hip -X-ray showed protrusion and migration of the acetabular cup -Stress fracture of the obturator ring. -Orthopedics recommended to followup with him as outpatient, he'll likely need surgery.  Acute  Pulmonary Edema -Mild pulmonary edema, IV fluids stopped already -this is likely acute CHF 2/2 to underlying diastolic dysfunction worsened by IVF  -Patient given IV Lasix  on 5/21 and 5/22  -Patient feels much better, shortness of breath almost resolved-however CXR on 5/22 still shows congestion - 2 D echo reviewed  Maculopapular rash -?antibiotic-from Levaquin -stop Levaquin-as already completed more than 7 days of treatment -started on prednisone-5/23-continue -spoke with Dr Rutherford Limerick on call 5/24-clinical and labs discussed-likely drug rash-she recommended to continue with steroids, add benadryl and topical triamcinolone. Likely needs to run its course, to call back Derm if target lesions develop or mucus membranes get involved.Rash essentially unchanged-but now coalescing  Diabetes mellitus type 2 -Controlled with hemoglobin of 6.9. -Continue preadmission medication, utilize SSI and carb modified diet while in the hospital.  Atrial fibrillation -Rate controlled.Continue with Digoxin and metoprolol -On chronic Coumadin, pharmacy is to dose of Coumadin.  HTN -continue with Metoprolol and Ramipril  History of prostate cancer -Patient had previous treatment. PSA is suppressed.  Disposition -SNF when rash better   LOS: 10 days   [facility] 11/02/2011, 11:02 AM

## 2011-11-02 NOTE — Consult Note (Addendum)
WOC consult Note Reason for Consult: Pressure Ulcer on Coccyx Stage II; sDTI on right lateral heel Wound type:Pressure Pressure Ulcer POA: No Measurement:Coccxy area of involvement measures 10cm x 10 cm with a partial thickness (Stage II) pressure ulcer notes measuring 3cm x 2cm x .2cm.  Bilateral heels are erythematous and while the left blanches, the right does not.  In this area there is a purple/maroon discoloration of the intact skin measuring 1.5cm x 3cm. Wound bed: clean, pink, moist Drainage (amount, consistency, odor) none Periwound:Macerated in field of resolving rash secondary to drug interaction Dressing procedure/placement/frequency:The coccyx is partial thickness and should reepithelialize with topical therapy, positioning and implementation of a low air loss sleep surface. Heel boots are provided as are orders for a topical dressing (silicone) to the right lateral heel. Dr. Jerral Ralph informed of observations and treatment recommendations/orders and is in agreement. WOC team to follow.  Please re-consult if needed in-between our intermittent visits.. Thanks, Ladona Mow, MSN, RN, [facility], CWOCN [phone])

## 2011-11-03 DIAGNOSIS — I4891 Unspecified atrial fibrillation: Secondary | ICD-10-CM

## 2011-11-03 DIAGNOSIS — S72009A Fracture of unspecified part of neck of unspecified femur, initial encounter for closed fracture: Secondary | ICD-10-CM

## 2011-11-03 DIAGNOSIS — L270 Generalized skin eruption due to drugs and medicaments taken internally: Secondary | ICD-10-CM

## 2011-11-03 DIAGNOSIS — R509 Fever, unspecified: Secondary | ICD-10-CM

## 2011-11-03 LAB — PROTIME-INR: Prothrombin Time: 29.9 seconds — ABNORMAL HIGH (ref 11.6–15.2)

## 2011-11-03 LAB — GLUCOSE, CAPILLARY
Glucose-Capillary: 238 mg/dL — ABNORMAL HIGH (ref 70–99)
Glucose-Capillary: 209 mg/dL — ABNORMAL HIGH (ref 70–99)

## 2011-11-03 MED ORDER — METOPROLOL SUCCINATE ER 50 MG PO TB24
50.0000 mg | ORAL_TABLET | Freq: Two times a day (BID) | ORAL | Status: DC
  Administered 2011-11-03 – 2011-11-05 (×4): 50 mg via ORAL
  Filled 2011-11-03 (×6): qty 1

## 2011-11-03 MED ORDER — WARFARIN 0.5 MG HALF TABLET
0.5000 mg | ORAL_TABLET | Freq: Once | ORAL | Status: AC
  Administered 2011-11-03: 0.5 mg via ORAL
  Filled 2011-11-03: qty 1

## 2011-11-03 MED ORDER — PREDNISONE 20 MG PO TABS
30.0000 mg | ORAL_TABLET | Freq: Every day | ORAL | Status: DC
  Administered 2011-11-04 – 2011-11-05 (×2): 30 mg via ORAL
  Filled 2011-11-03 (×3): qty 1

## 2011-11-03 NOTE — Progress Notes (Signed)
C.Diff negative.  Isolation precautions discontinued. [name]

## 2011-11-03 NOTE — Progress Notes (Signed)
DAILY PROGRESS NOTE                              GENERAL INTERNAL MEDICINE [facility]  SUBJECTIVE: Awake, alert and oriented No fevers for past 72 hours, rash coalescing-but otherwise no further spread. No mucus membranes involved as off yet  OBJECTIVE: BP 127/53  Pulse 71  Temp(Src) 97.9 F (36.6 C) (Oral)  Resp 18  Ht 5\' 9"  (1.753 m)  Wt 70.4 kg (155 lb 3.3 oz)  BMI 22.92 kg/m2  SpO2 98%  Intake/Output Summary (Last 24 hours) at 11/03/11 1043 Last data filed at 11/03/11 0700  Gross per 24 hour  Intake     60 ml  Output    501 ml  Net   -441 ml                      Weight change:  Physical Exam: General: Alert, disoriented not in any acute distress. HEENT: anicteric sclera, pupils equal reactive to light and accommodation CVS: S1-S2 heard, no murmur rubs or gallops Chest: clear to auscultation bilaterally, bibasilar rales Abdomen:  normal bowel sounds, soft, nontender, nondistended, no organomegaly Neuro: Cranial nerves II-XII intact, no focal neurological deficits Extremities: no cyanosis, no clubbing or edema noted bilaterally Skin-maculopapular rash-right lateral thing and back-tspreading to ant abd and neck as well-lower part of face involved. No mucus membranes involved. No target lesion   Lab Results: No results found for this basename: NA:2,K:2,CL:2,CO2:2,GLUCOSE:2,BUN:2,CREATININE:2,CALCIUM:2,MG:2,PHOS:2 in the last 72 hours No results found for this basename: AST:2,ALT:2,ALKPHOS:2,BILITOT:2,PROT:2,ALBUMIN:2 in the last 72 hours No results found for this basename: LIPASE:2,AMYLASE:2 in the last 72 hours  Basename 11/01/11 0548  WBC 19.2*  NEUTROABS --  HGB 10.1*  HCT 29.7*  MCV 94.6  PLT 303   No results found for this basename: CKTOTAL:3,CKMB:3,CKMBINDEX:3,TROPONINI:3 in the last 72 hours No components found with this basename: POCBNP:3 No results found for this basename: DDIMER:2 in the last 72 hours No results found for this basename: HGBA1C:2  in the last 72 hours No results found for this basename: CHOL:2,HDL:2,LDLCALC:2,TRIG:2,CHOLHDL:2,LDLDIRECT:2 in the last 72 hours No results found for this basename: TSH,T4TOTAL,FREET3,T3FREE,THYROIDAB in the last 72 hours No results found for this basename: VITAMINB12:2,FOLATE:2,FERRITIN:2,TIBC:2,IRON:2,RETICCTPCT:2 in the last 72 hours  Micro Results: Recent Results (from the past 240 hour(s))  URINE CULTURE     Status: Normal   Collection Time   10/25/11  7:33 PM      Component Value Range Status Comment   Specimen Description URINE, SUPRAPUBIC   Final    Special Requests NONE   Final    Culture  Setup Time [id number]   Final    Colony Count NO GROWTH   Final    Culture NO GROWTH   Final    Report Status 10/27/2011 FINAL   Final   CULTURE, BLOOD (ROUTINE X 2)     Status: Normal (Preliminary result)   Collection Time   10/29/11  4:10 PM      Component Value Range Status Comment   Specimen Description BLOOD HAND RIGHT   Final    Special Requests BOTTLES DRAWN AEROBIC ONLY 10CC   Final    Culture  Setup Time [id number]   Final    Culture     Final    Value:        BLOOD CULTURE RECEIVED NO GROWTH TO DATE CULTURE WILL BE HELD FOR 5 DAYS BEFORE ISSUING A FINAL  NEGATIVE REPORT   Report Status PENDING   Incomplete   URINE CULTURE     Status: Normal   Collection Time   10/29/11  6:42 PM      Component Value Range Status Comment   Specimen Description URINE, CATHETERIZED   Final    Special Requests NONE   Final    Culture  Setup Time [id number]   Final    Colony Count NO GROWTH   Final    Culture NO GROWTH   Final    Report Status 10/30/2011 FINAL   Final   CLOSTRIDIUM DIFFICILE BY PCR     Status: Normal   Collection Time   11/01/11  5:41 PM      Component Value Range Status Comment   C difficile by pcr NEGATIVE  NEGATIVE  Final     Studies/Results: Dg Chest 2 View  10/23/2011  *RADIOLOGY REPORT*  Clinical Data: Fatigue.  Fever.  Altered level of consciousness.  CHEST - 2  VIEW  Comparison: 05/09/2009  Findings: Prior CABG noted.  Mild cardiomegaly is present with indistinct pulmonary vasculature and cephalization of blood flow suggesting pulmonary venous hypertension.  Overt edema is not observed.  Linear subsegmental atelectasis noted at the left lung base.  Atherosclerotic calcification of the aortic arch is noted.  No pleural effusion is identified.  IMPRESSION:  1.  Cardiomegaly with pulmonary venous hypertension.  No overt edema. 2.  Atherosclerosis.  Original Report Authenticated By: Dellia Cloud, M.D.   Dg Hip Complete Right  10/24/2011  *RADIOLOGY REPORT*  Clinical Data: Right hip pain.  Remote hip surgery.  RIGHT HIP - COMPLETE 2+ VIEW  Comparison: 05/10/2009.  Findings: Interval protrusio of the right hip prosthesis with areas of lucency and sclerosis around the acetabular cup.  The acetabular cup has rotated into a more vertical position.  The femoral component is intact.  No findings for loosening or fracture.  There appear to be pathologic or stress fractures involving the obturator ring.  Inferior pubic ramus fracture noted and probable fracture at the ischiopubic junction. CT recommended for further evaluation and preoperative planning.  IMPRESSION:  1.  Protrusio and rotation of the acetabular cup. 2.  Pathologic /stress fractures involving the obturator ring. Cannot exclude underlying particle disease or infection.  CT recommended for further evaluation.  Original Report Authenticated By: P. Loralie Champagne, M.D.   Medications: Scheduled Meds:    . atorvastatin  80 mg Oral Daily  . digoxin  125 mcg Oral Daily  . diphenhydrAMINE  25 mg Oral Q2000  . feeding supplement  237 mL Oral TID BM  . gabapentin  300 mg Oral TID  . insulin aspart  0-15 Units Subcutaneous TID WC  . metoprolol succinate  100 mg Oral BID  . predniSONE  40 mg Oral Q breakfast  . ramipril  10 mg Oral Daily  . sodium chloride  3 mL Intravenous Q12H  . triamcinolone ointment    Topical BID  . warfarin  1.5 mg Oral ONCE-1800  . Warfarin - Pharmacist Dosing Inpatient   Does not apply q1800  . DISCONTD: insulin aspart  0-9 Units Subcutaneous TID WC   Continuous Infusions:   PRN Meds:.acetaminophen, acetaminophen, albuterol, guaiFENesin, morphine injection, ondansetron (ZOFRAN) IV, ondansetron, oxyCODONE-acetaminophen, sodium chloride  ASSESSMENT & PLAN: Principal Problem:  *UTI (lower urinary tract infection) Active Problems:   Interval history 78 year old Caucasian male with past medical history of diabetes, prostate cancer status post indwelling suprapubic catheter. Patient admitted to the hospital  with UTI. Patient initially started on Zosyn after the urine and blood cultures obtained. Patient was still spiking fever after 24 hours and vancomycin was added after wife reported that he had history of staph UTI. Patient also has right hip pain turned out to be periprosthetic fracture. Patient was seen by orthopedics and they want to evaluate him after the infection cleared for surgery, this will be done as outpatient.  Still spiking fever-seen by ID-Vanco/Zosyn discontinued-started on Levaquin,  UTI -Complicated, suprapubic catheter related UTI. -Catheter was changed in the emergency department at the time of admission. -Was spiking fever while on Zosyn, vancomycin added as patient had history of staph UTI. -Blood cultures obtained, repeat urine culture as the current culture showing multiple morphotypes. -Seen by ID on 5/21-recommended transition to Levaquin, -however because of rash and the fact that patient completed 7 days of abx-all antibitoics now stopped -because of worsening rash-Levaquin stopped on 5/23 -currently not on any further anitbiotics-leukocytosis-today-likely 2/2 prednisone-afebrile for 72 hours now  Chronic pain and weakness in the right hip -X-ray showed protrusion and migration of the acetabular cup -Stress fracture of the obturator  ring. -Orthopedics recommended to followup with him as outpatient, he'll likely need surgery.  Acute Pulmonary Edema -Mild pulmonary edema, IV fluids stopped already -this is likely acute CHF 2/2 to underlying diastolic dysfunction worsened by IVF  -Patient given IV Lasix  on 5/21 and 5/22  -Patient feels much better, shortness of breath almost resolved-however CXR on 5/22 still shows congestion - 2 D echo reviewed  Maculopapular rash -?antibiotic-from Levaquin -stop Levaquin-as already completed more than 7 days of treatment -started on prednisone-5/23-continue -spoke with Dr Rutherford Limerick on call 5/24-clinical and labs discussed-likely drug rash-she recommended to continue with steroids, add benadryl and topical triamcinolone. Likely needs to run its course, to call back Derm if target lesions develop or mucus membranes get involved.Rash essentially unchanged-but now coalescing and fading in some areas -decrease prednisone to 30 mg  Diabetes mellitus type 2 -Controlled with hemoglobin of 6.9. -Continue preadmission medication, utilize SSI and carb modified diet while in the hospital.  Atrial fibrillation -Rate controlled.Continue with Digoxin and metoprolol -On chronic Coumadin, pharmacy is to dose of Coumadin.  HTN -continue with Metoprolol -resume Ramipril at discharge  Dyslipidemia -stop statins-resume at discharge when rash much better  Peripheral Neuropathy -stop neurontin, and resume at discharge when rash much better  History of prostate cancer -Patient had previous treatment. PSA is suppressed.  Stage II pressure ulcer -per wound care  Disposition -SNF when rash better   LOS: 11 days   [facility] 11/03/2011, 10:43 AM

## 2011-11-03 NOTE — Progress Notes (Signed)
ANTICOAGULATION CONSULT NOTE - Follow Up Consult  Pharmacy Consult for Warfarin Indication: atrial fibrillation  Allergies  Allergen Reactions  . Levaquin (Levofloxacin) Rash  . Codeine Other (See Comments)    unknown  . Penicillins Other (See Comments)    unknown    Patient Measurements: Height: 5\' 9"  (175.3 cm) Weight: 155 lb 3.3 oz (70.4 kg) IBW/kg (Calculated) : 70.7   Vital Signs: Temp: 97.9 F (36.6 C) (05/26 0519) Temp src: Oral (05/26 0519) BP: 127/53 mmHg (05/26 0911) Pulse Rate: 71  (05/26 0911)  Labs:  Alvira Philips 11/03/11 0457 11/02/11 0625 11/01/11 0548  HGB -- -- 10.1*  HCT -- -- 29.7*  PLT -- -- 303  APTT -- -- --  LABPROT 29.9* 27.9* 31.3*  INR 2.79* 2.56* 2.96*  HEPARINUNFRC -- -- --  CREATININE -- -- --  CKTOTAL -- -- --  CKMB -- -- --  TROPONINI -- -- --    Estimated Creatinine Clearance: 67 ml/min (by C-G formula based on Cr of 0.92).  Assessment: 78 y.o. M resumed on warfarin from PTA for history of Afib. Patient found to have increased warfarin sensitivity this admission requiring doses to be held. Warfarin was resumed on 5/24 and the INR remains therapeutic this a.m though trending back up (INR 2.79 << 2.56, goal of 2-3). Prednisone was started on 5/24 which is known to increase warfarin sensitivity. PTA dose was known to be 3 mg daily. No CBC since 5/24 -- Hgb/Hct/Plt appeared stable at that time. No s/sx of bleeding noted. The patient has been re-educated this admission  Goal of Therapy:  INR 2-3 Monitor platelets by anticoagulation protocol: Yes   Plan:  1. Warfarin 0.5 mg x 1 dose at 1800 today 2. Will continue to monitor for any signs/symptoms of bleeding and will follow up with PT/INR in the a.m.   Georgina Pillion, PharmD, BCPS Clinical Pharmacist Pager: [phone] 11/03/2011 1:01 PM

## 2011-11-04 DIAGNOSIS — R509 Fever, unspecified: Secondary | ICD-10-CM

## 2011-11-04 DIAGNOSIS — L270 Generalized skin eruption due to drugs and medicaments taken internally: Secondary | ICD-10-CM

## 2011-11-04 DIAGNOSIS — S72009A Fracture of unspecified part of neck of unspecified femur, initial encounter for closed fracture: Secondary | ICD-10-CM

## 2011-11-04 DIAGNOSIS — I4891 Unspecified atrial fibrillation: Secondary | ICD-10-CM

## 2011-11-04 LAB — BASIC METABOLIC PANEL
Calcium: 8.2 mg/dL — ABNORMAL LOW (ref 8.4–10.5)
GFR calc non Af Amer: 88 mL/min — ABNORMAL LOW (ref >90)
Potassium: 3.1 mEq/L — ABNORMAL LOW (ref 3.5–5.1)
Sodium: 138 mEq/L (ref 135–145)

## 2011-11-04 LAB — CBC
Hemoglobin: 9.1 g/dL — ABNORMAL LOW (ref 13.0–17.0)
MCH: 31 pg (ref 26.0–34.0)
MCHC: 32.9 g/dL (ref 30.0–36.0)
Platelets: 359 10*3/uL (ref 150–400)
RBC: 2.94 MIL/uL — ABNORMAL LOW (ref 4.22–5.81)

## 2011-11-04 LAB — GLUCOSE, CAPILLARY: Glucose-Capillary: 125 mg/dL — ABNORMAL HIGH (ref 70–99)

## 2011-11-04 LAB — PROTIME-INR
INR: 3 — ABNORMAL HIGH (ref 0.00–1.49)
Prothrombin Time: 31.6 seconds — ABNORMAL HIGH (ref 11.6–15.2)

## 2011-11-04 MED ORDER — POTASSIUM CHLORIDE CRYS ER 20 MEQ PO TBCR
40.0000 meq | EXTENDED_RELEASE_TABLET | Freq: Once | ORAL | Status: AC
  Administered 2011-11-04: 40 meq via ORAL
  Filled 2011-11-04: qty 2

## 2011-11-04 MED ORDER — WARFARIN 0.5 MG HALF TABLET
0.5000 mg | ORAL_TABLET | Freq: Once | ORAL | Status: AC
  Administered 2011-11-04: 0.5 mg via ORAL
  Filled 2011-11-04: qty 1

## 2011-11-04 NOTE — Progress Notes (Signed)
Speech Language Pathology Dysphagia Treatment Patient Details Name: [name] MRN: [id number] DOB: Mar 13, 1932 Today's Date: 11/04/2011 Time: 4098-1191 SLP Time Calculation (min): 15 min  Assessment / Plan / Recommendation Clinical Impression  Pt for possible D/C today; fever resolved and MS improved.  Reviewed results of MBS last week and general precautions to maintain safety with POs.  Pt able to verbalize basic precautions to facilitate transfer of POs through esophagus, minimize esophageal stasis, and protect airway with min verbal cues.  Spouse knowledgable re: recs.  No SLP f/u rec after D/C.      Diet Recommendation  Continue with Current Diet: Thin liquid;Dysphagia 3 (mechanical soft)    SLP Plan Discharge SLP treatment due to (comment) goals met/D/C from hospital  Pertinent Vitals/Pain No pain   Swallowing Goals  SLP Swallowing Goals Swallow Study Goal #1 - Progress: Met Swallow Study Goal #2 - Progress: Met  General Temperature Spikes Noted: No Respiratory Status: Supplemental O2 delivered via (comment) Behavior/Cognition: Alert;Cooperative;Pleasant mood Oral Cavity - Dentition: Dentures, top;Dentures, bottom Patient Positioning: Upright in bed  Oral Cavity - Oral Hygiene     Dysphagia Treatment Treatment focused on: Patient/family/caregiver education Patient observed directly with PO's: No Reason PO's not observed: Other (comment) (review of precautions/education with pt and spouse) Amount of cueing: Minimal   [name] L. [name], [place] CCC/SLP Pager [phone]  [name] [name] 11/04/2011, 9:23 AM

## 2011-11-04 NOTE — Progress Notes (Addendum)
Physical Therapy Treatment Patient Details Name: SYON TEWS MRN: [id number] DOB: 09-02-1931 Today's Date: 11/04/2011 Time: 1308-6578 PT Time Calculation (min): 25 min  PT Assessment / Plan / Recommendation Comments on Treatment Session  Pt adm with UTI and found to have rt hip fx. No surgery planned until UTI resolved. Pt made NWB on RLE.  Patient did better today with sitting on edge of bed and was able to attempt stand pivot transfer.  patient and wife asked about discharge home versus SNF.  Do not feel patient safe for discharge at this time.  Still requires +2 assitance for most mobility.  Discussed this with wife and patient.  Continue to recommend SNF until surgery.                                              Plan Discharge plan remains appropriate;Frequency remains appropriate    Precautions / Restrictions Restrictions Weight Bearing Restrictions: Yes RLE Weight Bearing: Non weight bearing   Pertinent Vitals/Pain No pain    Mobility  Bed Mobility Bed Mobility: Supine to Sit Supine to Sit: 1: +2 Total assist Supine to Sit: Patient Percentage: 50% Sitting - Scoot to Edge of Bed: 2: Max assist Details for Bed Mobility Assistance: patient improved in supine - to - sit; better able to hold self up on side of bed, required cuing and manual facilitation to scoot hips forward to edge of bed. Transfers Transfers: Sit to Stand;Stand to Sit;Stand Pivot Transfers Sit to Stand: 1: +2 Total assist;With upper extremity assist Sit to Stand: Patient Percentage: 50% Stand to Sit: 1: +2 Total assist;With upper extremity assist Stand to Sit: Patient Percentage: 50% Stand Pivot Transfers: 1: +2 Total assist Stand Pivot Transfers: Patient Percentage: 40% Details for Transfer Assistance: patient required max cuing to lean forward for sitting edge of bed and transfers; manual facilitation for all aspects of transfers.    Exercises Total Joint Exercises Ankle Circles/Pumps: AROM;Both;10  reps Quad Sets: AROM;Both;10 reps Gluteal Sets: AROM;Both;10 reps      Visit Information  Last PT Received On: 11/04/11 Assistance Needed: +2    Subjective Data  Subjective: Wife and patient asking about home versus SNF.     Cognition  Overall Cognitive Status: Appears within functional limits for tasks assessed/performed Arousal/Alertness: Awake/alert Orientation Level: Appears intact for tasks assessed Behavior During Session: [facility] for tasks performed    Balance  Static Sitting Balance Static Sitting - Balance Support: Bilateral upper extremity supported;Feet supported Static Sitting - Level of Assistance: 4: Min assist Static Sitting - Comment/# of Minutes: patient able to hold self sitting on edge of bed for periods of time, unable to take any resistance; would lose balance posteriorly.  Able to sit without assistance holding onto walker for 1 minutes.  End of Session PT - End of Session Activity Tolerance: Patient tolerated treatment well Patient left: in chair;with call bell/phone within reach;with family/visitor present Nurse Communication: Mobility status    Olivia Canter, [place] [phone] 11/04/2011, 10:56 AM

## 2011-11-04 NOTE — Progress Notes (Signed)
INFECTIOUS DISEASE PROGRESS NOTE  ID: [name] is a 78 y.o. male with  Principal Problem:  *UTI (lower urinary tract infection) Active Problems:  DIABETES MELLITUS, TYPE II  CAD, ARTERY BYPASS GRAFT  Atrial fibrillation  Fever  Subjective: Without complaint. Feels like rash is improving   Abtx:  Anti-infectives     Start     Dose/Rate Route Frequency Ordered Stop   10/29/11 1600   levofloxacin (LEVAQUIN) tablet 500 mg  Status:  Discontinued        500 mg Oral Every 24 hours 10/29/11 1543 10/31/11 0755   10/28/11 1200   vancomycin (VANCOCIN) IVPB 1000 mg/200 mL premix  Status:  Discontinued        1,000 mg 200 mL/hr over 60 Minutes Intravenous Every 12 hours 10/28/11 1140 10/30/11 0115   10/24/11 2300   vancomycin (VANCOCIN) 750 mg in sodium chloride 0.9 % 150 mL IVPB  Status:  Discontinued        750 mg 150 mL/hr over 60 Minutes Intravenous Every 12 hours 10/24/11 1218 10/28/11 1139   10/24/11 1230   vancomycin (VANCOCIN) 750 mg in sodium chloride 0.9 % 150 mL IVPB        750 mg 150 mL/hr over 60 Minutes Intravenous  Once 10/24/11 1218 10/24/11 1522   10/24/11 0800   piperacillin-tazobactam (ZOSYN) IVPB 3.375 g  Status:  Discontinued        3.375 g 12.5 mL/hr over 240 Minutes Intravenous Every 8 hours 10/24/11 0244 10/30/11 0115   10/24/11 0245  piperacillin-tazobactam (ZOSYN) IVPB 3.375 g       3.375 g 100 mL/hr over 30 Minutes Intravenous  Once 10/24/11 0244 10/24/11 0407   10/23/11 2300   cefTRIAXone (ROCEPHIN) 1 g in dextrose 5 % 50 mL IVPB        1 g 100 mL/hr over 30 Minutes Intravenous  Once 10/23/11 2247 10/23/11 2329          Medications:  Scheduled:   . digoxin  125 mcg Oral Daily  . diphenhydrAMINE  25 mg Oral Q2000  . feeding supplement  237 mL Oral TID BM  . insulin aspart  0-15 Units Subcutaneous TID WC  . metoprolol succinate  50 mg Oral BID  . potassium chloride  40 mEq Oral Once  . predniSONE  30 mg Oral Q breakfast  . sodium chloride   3 mL Intravenous Q12H  . triamcinolone ointment   Topical BID  . warfarin  0.5 mg Oral ONCE-1800  . Warfarin - Pharmacist Dosing Inpatient   Does not apply q1800    Objective: Vital signs in last 24 hours: Temp:  [98 F (36.7 C)-98.7 F (37.1 C)] 98.5 F (36.9 C) (05/27 1258) Pulse Rate:  [65-80] 69  (05/27 1258) Resp:  [18] 18  (05/27 1258) BP: (141-166)/(56-72) 166/72 mmHg (05/27 1258) SpO2:  [95 %-98 %] 97 % (05/27 1258) Weight:  [74.345 kg (163 lb 14.4 oz)] 74.345 kg (163 lb 14.4 oz) (05/27 2130)   General appearance: alert, cooperative and no distress Skin: macular rash in groin is decreased in intensity.   Lab Results  [facility] 11/04/11 0625  WBC 10.5  HGB 9.1*  HCT 27.7*  NA 138  K 3.1*  CL 101  CO2 30  BUN 19  CREATININE 0.71  GLU --   Liver Panel No results found for this basename: PROT:2,ALBUMIN:2,AST:2,ALT:2,ALKPHOS:2,BILITOT:2,BILIDIR:2,IBILI:2 in the last 72 hours Sedimentation Rate No results found for this basename: ESRSEDRATE in the last 72 hours  C-Reactive Protein No results found for this basename: CRP:2 in the last 72 hours  Microbiology: Recent Results (from the past 240 hour(s))  URINE CULTURE     Status: Normal   Collection Time   10/25/11  7:33 PM      Component Value Range Status Comment   Specimen Description URINE, SUPRAPUBIC   Final    Special Requests NONE   Final    Culture  Setup Time [id number]   Final    Colony Count NO GROWTH   Final    Culture NO GROWTH   Final    Report Status 10/27/2011 FINAL   Final   CULTURE, BLOOD (ROUTINE X 2)     Status: Normal (Preliminary result)   Collection Time   10/29/11  4:10 PM      Component Value Range Status Comment   Specimen Description BLOOD HAND RIGHT   Final    Special Requests BOTTLES DRAWN AEROBIC ONLY 10CC   Final    Culture  Setup Time [id number]   Final    Culture     Final    Value:        BLOOD CULTURE RECEIVED NO GROWTH TO DATE CULTURE WILL BE HELD FOR 5 DAYS BEFORE  ISSUING A FINAL NEGATIVE REPORT   Report Status PENDING   Incomplete   URINE CULTURE     Status: Normal   Collection Time   10/29/11  6:42 PM      Component Value Range Status Comment   Specimen Description URINE, CATHETERIZED   Final    Special Requests NONE   Final    Culture  Setup Time [id number]   Final    Colony Count NO GROWTH   Final    Culture NO GROWTH   Final    Report Status 10/30/2011 FINAL   Final   CLOSTRIDIUM DIFFICILE BY PCR     Status: Normal   Collection Time   11/01/11  5:41 PM      Component Value Range Status Comment   C difficile by pcr NEGATIVE  NEGATIVE  Final     Studies/Results: No results found.   Assessment/Plan: ADR- flouroquinolone UTI He is improving.  Cautioned patient not to take any drugs that are called- avelox, cipro or levaquin.  Plan is for him to be d/c in AM Please let us know if we can be of further assistance.    Johny Sax Infectious Diseases [phone] 11/04/2011, 6:30 PM   LOS: 12 days

## 2011-11-04 NOTE — Progress Notes (Signed)
Clinical Social Worker continuing to follow for disposition planning. Per MD, pt and pt wife discussing home vs SNF at discharge. PT continues to recommend SNF. Pt has a bed at [facility] when pt medically stable for discharge. Per MD, anticipate pt will be medically stable for discharge tomorrow. Clinical Social Worker to follow up with pt and pt wife in regard to decision between home vs SNF. Clinical Social Worker to continue to follow and facilitate pt discharge needs when pt medically stable for discharge.  Jacklynn Lewis, MSW, LCSWA  Clinical Social Work [phone]

## 2011-11-04 NOTE — Progress Notes (Signed)
Pt able to stand up for one minute using walker. It took some motivation for him to stay up for the entire minute though. 2 person assist in other activities.

## 2011-11-04 NOTE — Progress Notes (Signed)
ANTICOAGULATION CONSULT NOTE - Follow Up Consult  Pharmacy Consult for Warfarin Indication: atrial fibrillation  Allergies  Allergen Reactions  . Levaquin (Levofloxacin) Rash  . Codeine Other (See Comments)    unknown  . Penicillins Other (See Comments)    unknown    Patient Measurements: Height: 5\' 9"  (175.3 cm) Weight: 163 lb 14.4 oz (74.345 kg) IBW/kg (Calculated) : 70.7   Vital Signs: Temp: 98 F (36.7 C) (05/27 0434) Temp src: Oral (05/27 0434) BP: 141/56 mmHg (05/27 0834) Pulse Rate: 74  (05/27 0834)  Labs:  Basename 11/04/11 0625 11/03/11 0457 11/02/11 0625  HGB 9.1* -- --  HCT 27.7* -- --  PLT 359 -- --  APTT -- -- --  LABPROT 31.6* 29.9* 27.9*  INR 3.00* 2.79* 2.56*  HEPARINUNFRC -- -- --  CREATININE 0.71 -- --  CKTOTAL -- -- --  CKMB -- -- --  TROPONINI -- -- --    Estimated Creatinine Clearance: 77.3 ml/min (by C-G formula based on Cr of 0.71).  Assessment: 78 y.o. M resumed on warfarin from PTA for history of Afib. Patient found to have increased warfarin sensitivity this admission requiring doses to be held. Warfarin was resumed on 5/24 and the INR remains therapeutic this a.m though trending back up (INR 3<<2.79 << 2.56, goal of 2-3). Prednisone was started on 5/24 which is known to increase warfarin sensitivity. PTA dose was known to be 3 mg daily. No CBC since 5/24 -- Hgb/Hct/Plt appeared stable at that time. No s/sx of bleeding noted. The patient has been re-educated this admission  Goal of Therapy:  INR 2-3 Monitor platelets by anticoagulation protocol: Yes   Plan:  1. Warfarin 0.5 mg x 1 dose at 1800 today 2. Will continue to monitor for any signs/symptoms of bleeding and will follow up with PT/INR in the a.m.   Celedonio Miyamoto, PharmD, BCPS Clinical Pharmacist Pager [phone]  11/04/2011 9:40 AM

## 2011-11-04 NOTE — Progress Notes (Signed)
DAILY PROGRESS NOTE                              [facility]  SUBJECTIVE: Awake, alert and oriented No fevers for past 4 days, rash coalescing-but otherwise no further spread. No mucus membranes involvement OBJECTIVE: BP 141/56  Pulse 80  Temp(Src) 98 F (36.7 C) (Oral)  Resp 18  Ht 5\' 9"  (1.753 m)  Wt 74.345 kg (163 lb 14.4 oz)  BMI 24.20 kg/m2  SpO2 95%  Intake/Output Summary (Last 24 hours) at 11/04/11 1258 Last data filed at 11/03/11 2100  Gross per 24 hour  Intake    720 ml  Output      0 ml  Net    720 ml                      Weight change:  Physical Exam: General: Alert, disoriented not in any acute distress. HEENT: anicteric sclera, pupils equal reactive to light and accommodation CVS: S1-S2 heard, no murmur rubs or gallops Chest: clear to auscultation bilaterally, bibasilar rales Abdomen:  normal bowel sounds, soft, nontender, nondistended, no organomegaly Neuro: Cranial nerves II-XII intact, no focal neurological deficits Extremities: no cyanosis, no clubbing or edema noted bilaterally Skin-maculopapular rash-right lateral thing and back-tspreading to ant abd and neck as well-lower part of face involved. No mucus membranes involved. No target lesion   Lab Results:  Basename 11/04/11 0625  NA 138  K 3.1*  CL 101  CO2 30  GLUCOSE 125*  BUN 19  CREATININE 0.71  CALCIUM 8.2*  MG --  PHOS --   No results found for this basename: AST:2,ALT:2,ALKPHOS:2,BILITOT:2,PROT:2,ALBUMIN:2 in the last 72 hours No results found for this basename: LIPASE:2,AMYLASE:2 in the last 72 hours  Basename 11/04/11 0625  WBC 10.5  NEUTROABS --  HGB 9.1*  HCT 27.7*  MCV 94.2  PLT 359   No results found for this basename: CKTOTAL:3,CKMB:3,CKMBINDEX:3,TROPONINI:3 in the last 72 hours No components found with this basename: POCBNP:3 No results found for this basename: DDIMER:2 in the last 72 hours No results found for this basename: HGBA1C:2 in  the last 72 hours No results found for this basename: CHOL:2,HDL:2,LDLCALC:2,TRIG:2,CHOLHDL:2,LDLDIRECT:2 in the last 72 hours No results found for this basename: TSH,T4TOTAL,FREET3,T3FREE,THYROIDAB in the last 72 hours No results found for this basename: VITAMINB12:2,FOLATE:2,FERRITIN:2,TIBC:2,IRON:2,RETICCTPCT:2 in the last 72 hours  Micro Results: Recent Results (from the past 240 hour(s))  URINE CULTURE     Status: Normal   Collection Time   10/25/11  7:33 PM      Component Value Range Status Comment   Specimen Description URINE, SUPRAPUBIC   Final    Special Requests NONE   Final    Culture  Setup Time [id number]   Final    Colony Count NO GROWTH   Final    Culture NO GROWTH   Final    Report Status 10/27/2011 FINAL   Final   CULTURE, BLOOD (ROUTINE X 2)     Status: Normal (Preliminary result)   Collection Time   10/29/11  4:10 PM      Component Value Range Status Comment   Specimen Description BLOOD HAND RIGHT   Final    Special Requests BOTTLES DRAWN AEROBIC ONLY 10CC   Final    Culture  Setup Time [id number]   Final    Culture     Final    Value:  BLOOD CULTURE RECEIVED NO GROWTH TO DATE CULTURE WILL BE HELD FOR 5 DAYS BEFORE ISSUING A FINAL NEGATIVE REPORT   Report Status PENDING   Incomplete   URINE CULTURE     Status: Normal   Collection Time   10/29/11  6:42 PM      Component Value Range Status Comment   Specimen Description URINE, CATHETERIZED   Final    Special Requests NONE   Final    Culture  Setup Time [id number]   Final    Colony Count NO GROWTH   Final    Culture NO GROWTH   Final    Report Status 10/30/2011 FINAL   Final   CLOSTRIDIUM DIFFICILE BY PCR     Status: Normal   Collection Time   11/01/11  5:41 PM      Component Value Range Status Comment   C difficile by pcr NEGATIVE  NEGATIVE  Final     Studies/Results: Dg Chest 2 View  10/23/2011  *RADIOLOGY REPORT*  Clinical Data: Fatigue.  Fever.  Altered level of consciousness.  CHEST - 2 VIEW   Comparison: 05/09/2009  Findings: Prior CABG noted.  Mild cardiomegaly is present with indistinct pulmonary vasculature and cephalization of blood flow suggesting pulmonary venous hypertension.  Overt edema is not observed.  Linear subsegmental atelectasis noted at the left lung base.  Atherosclerotic calcification of the aortic arch is noted.  No pleural effusion is identified.  IMPRESSION:  1.  Cardiomegaly with pulmonary venous hypertension.  No overt edema. 2.  Atherosclerosis.  Original Report Authenticated By: Dellia Cloud, M.D.   Dg Hip Complete Right  10/24/2011  *RADIOLOGY REPORT*  Clinical Data: Right hip pain.  Remote hip surgery.  RIGHT HIP - COMPLETE 2+ VIEW  Comparison: 05/10/2009.  Findings: Interval protrusio of the right hip prosthesis with areas of lucency and sclerosis around the acetabular cup.  The acetabular cup has rotated into a more vertical position.  The femoral component is intact.  No findings for loosening or fracture.  There appear to be pathologic or stress fractures involving the obturator ring.  Inferior pubic ramus fracture noted and probable fracture at the ischiopubic junction. CT recommended for further evaluation and preoperative planning.  IMPRESSION:  1.  Protrusio and rotation of the acetabular cup. 2.  Pathologic /stress fractures involving the obturator ring. Cannot exclude underlying particle disease or infection.  CT recommended for further evaluation.  Original Report Authenticated By: P. Loralie Champagne, M.D.   Medications: Scheduled Meds:    . digoxin  125 mcg Oral Daily  . diphenhydrAMINE  25 mg Oral Q2000  . feeding supplement  237 mL Oral TID BM  . insulin aspart  0-15 Units Subcutaneous TID WC  . metoprolol succinate  50 mg Oral BID  . potassium chloride  40 mEq Oral Once  . predniSONE  30 mg Oral Q breakfast  . sodium chloride  3 mL Intravenous Q12H  . triamcinolone ointment   Topical BID  . warfarin  0.5 mg Oral ONCE-1800  . warfarin   0.5 mg Oral ONCE-1800  . Warfarin - Pharmacist Dosing Inpatient   Does not apply q1800  . DISCONTD: metoprolol succinate  100 mg Oral BID   Continuous Infusions:   PRN Meds:.acetaminophen, acetaminophen, albuterol, guaiFENesin, morphine injection, ondansetron (ZOFRAN) IV, ondansetron, oxyCODONE-acetaminophen, sodium chloride  ASSESSMENT & PLAN: Principal Problem:  *UTI (lower urinary tract infection) Active Problems:   Interval history 78 year old Caucasian male with past medical history of diabetes, prostate cancer  status post indwelling suprapubic catheter. Patient admitted to the hospital with UTI. Patient initially started on Zosyn after the urine and blood cultures obtained. Patient was still spiking fever after 24 hours and vancomycin was added after wife reported that he had history of staph UTI. Patient also has right hip pain turned out to be periprosthetic fracture. Patient was seen by orthopedics and they want to evaluate him after the infection cleared for surgery, this will be done as outpatient.  Still spiking fever-seen by ID-Vanco/Zosyn discontinued-started on Levaquin,  UTI -Complicated, suprapubic catheter related UTI. -Catheter was changed in the emergency department at the time of admission. -Was spiking fever while on Zosyn, vancomycin added as patient had history of staph UTI. -Blood cultures obtained, repeat urine culture as the current culture showing multiple morphotypes. -Seen by ID on 5/21-recommended transition to Levaquin, -however because of rash and the fact that patient completed 7 days of abx-all antibitoics now stopped -because of worsening rash-Levaquin stopped on 5/23 -currently not on any further anitbiotics-leukocytosis-today-likely 2/2 prednisone-afebrile for 4 days  Chronic pain and weakness in the right hip -X-ray showed protrusion and migration of the acetabular cup -Stress fracture of the obturator ring. -Orthopedics recommended to followup with  him as outpatient, he'll likely need surgery.  Acute Pulmonary Edema -Mild pulmonary edema, IV fluids stopped already -this is likely acute CHF 2/2 to underlying diastolic dysfunction worsened by IVF  -Patient given IV Lasix  on 5/21 and 5/22  -Patient feels much better, shortness of breath almost resolved-however CXR on 5/22 still shows congestion - 2 D echo reviewed  Maculopapular rash -?antibiotic-from Levaquin -stop Levaquin-as already completed more than 7 days of treatment -started on prednisone-5/23-continue -spoke with Dr Rutherford Limerick on call 5/24-clinical and labs discussed-likely drug rash-she recommended to continue with steroids, add benadryl and topical triamcinolone. Likely needs to run its course, to call back Derm if target lesions develop or mucus membranes get involved.Rash essentially unchanged-but now coalescing and fading in some areas -decrease prednisone to 30 mg-slowly taper off in a week or so  Diabetes mellitus type 2 -Controlled with hemoglobin of 6.9. -Continue preadmission medication, utilize SSI and carb modified diet while in the hospital.  Atrial fibrillation -Rate controlled.Continue with Digoxin and metoprolol -On chronic Coumadin, pharmacy is to dose of Coumadin.  HTN -continue with Metoprolol -resume Ramipril at discharge  Dyslipidemia -stop statins-resume at discharge when rash much better  Peripheral Neuropathy -stop neurontin, and resume at discharge when rash much better  History of prostate cancer -Patient had previous treatment. PSA is suppressed.  Stage II pressure ulcer -per wound care  Disposition -SNF vs Home tomorrow   LOS: 12 days   [facility] 11/04/2011, 12:58 PM

## 2011-11-05 ENCOUNTER — Encounter (HOSPITAL_COMMUNITY): Payer: Self-pay | Admitting: *Deleted

## 2011-11-05 ENCOUNTER — Emergency Department (HOSPITAL_COMMUNITY)
Admission: EM | Admit: 2011-11-05 | Discharge: 2011-11-05 | Disposition: A | Discharge status: Skilled Nursing Facility | Payer: Medicare Other | Attending: Emergency Medicine | Admitting: Emergency Medicine

## 2011-11-05 DIAGNOSIS — Z8546 Personal history of malignant neoplasm of prostate: Secondary | ICD-10-CM | POA: Insufficient documentation

## 2011-11-05 DIAGNOSIS — I4891 Unspecified atrial fibrillation: Secondary | ICD-10-CM

## 2011-11-05 DIAGNOSIS — I10 Essential (primary) hypertension: Secondary | ICD-10-CM | POA: Insufficient documentation

## 2011-11-05 DIAGNOSIS — R509 Fever, unspecified: Secondary | ICD-10-CM

## 2011-11-05 DIAGNOSIS — E119 Type 2 diabetes mellitus without complications: Secondary | ICD-10-CM | POA: Insufficient documentation

## 2011-11-05 DIAGNOSIS — T8389XA Other specified complication of genitourinary prosthetic devices, implants and grafts, initial encounter: Secondary | ICD-10-CM | POA: Insufficient documentation

## 2011-11-05 DIAGNOSIS — Z794 Long term (current) use of insulin: Secondary | ICD-10-CM | POA: Insufficient documentation

## 2011-11-05 DIAGNOSIS — L270 Generalized skin eruption due to drugs and medicaments taken internally: Secondary | ICD-10-CM

## 2011-11-05 DIAGNOSIS — Y846 Urinary catheterization as the cause of abnormal reaction of the patient, or of later complication, without mention of misadventure at the time of the procedure: Secondary | ICD-10-CM | POA: Insufficient documentation

## 2011-11-05 DIAGNOSIS — T839XXA Unspecified complication of genitourinary prosthetic device, implant and graft, initial encounter: Secondary | ICD-10-CM

## 2011-11-05 DIAGNOSIS — S72009A Fracture of unspecified part of neck of unspecified femur, initial encounter for closed fracture: Secondary | ICD-10-CM

## 2011-11-05 DIAGNOSIS — Z9889 Other specified postprocedural states: Secondary | ICD-10-CM | POA: Insufficient documentation

## 2011-11-05 LAB — GLUCOSE, CAPILLARY: Glucose-Capillary: 179 mg/dL — ABNORMAL HIGH (ref 70–99)

## 2011-11-05 LAB — CULTURE, BLOOD (ROUTINE X 2): Culture: NO GROWTH

## 2011-11-05 LAB — PROTIME-INR: Prothrombin Time: 24.6 seconds — ABNORMAL HIGH (ref 11.6–15.2)

## 2011-11-05 MED ORDER — POTASSIUM CHLORIDE ER 10 MEQ PO TBCR: 10.0000 meq | EXTENDED_RELEASE_TABLET | Freq: Two times a day (BID) | ORAL | Status: DC

## 2011-11-05 MED ORDER — WARFARIN SODIUM 3 MG PO TABS
3.0000 mg | ORAL_TABLET | Freq: Once | ORAL | Status: DC
  Filled 2011-11-05: qty 1

## 2011-11-05 MED ORDER — OXYCODONE-ACETAMINOPHEN 5-325 MG PO TABS: 1.0000 | ORAL_TABLET | Freq: Four times a day (QID) | ORAL | Status: DC | PRN

## 2011-11-05 MED ORDER — METOPROLOL SUCCINATE ER 50 MG PO TB24: 50.0000 mg | ORAL_TABLET | Freq: Two times a day (BID) | ORAL | Status: DC

## 2011-11-05 MED ORDER — FUROSEMIDE 20 MG PO TABS: 20.0000 mg | ORAL_TABLET | Freq: Two times a day (BID) | ORAL | Status: DC

## 2011-11-05 MED ORDER — GLUCERNA SHAKE PO LIQD: 237.0000 mL | Freq: Three times a day (TID) | ORAL | Status: DC

## 2011-11-05 MED ORDER — INSULIN ASPART 100 UNIT/ML ~~LOC~~ SOLN: 0.0000 [IU] | Freq: Three times a day (TID) | SUBCUTANEOUS | Status: DC

## 2011-11-05 MED ORDER — TRIAMCINOLONE ACETONIDE 0.1 % EX OINT: TOPICAL_OINTMENT | Freq: Two times a day (BID) | CUTANEOUS | Status: DC

## 2011-11-05 MED ORDER — PREDNISONE 10 MG PO TABS: 30.0000 mg | ORAL_TABLET | Freq: Every day | ORAL | Status: DC

## 2011-11-05 MED ORDER — DIPHENHYDRAMINE HCL 25 MG PO CAPS: 25.0000 mg | ORAL_CAPSULE | Freq: Every day | ORAL | Status: DC

## 2011-11-05 NOTE — ED Provider Notes (Signed)
History     CSN: [id number]  Arrival date & time 11/05/11  2055   First MD Initiated Contact with Patient 11/05/11 2128      Chief Complaint  Patient presents with  . leaking catheter     (Consider location/radiation/quality/duration/timing/severity/associated sxs/prior treatment) HPI Comments: D/ced from hospital with pelvic fx today.  Sent to ED because foley catheter was leaking.  Pt with dementia and without complaint.  The history is provided by the patient.    Past Medical History  Diagnosis Date  . A-fib   . CAD (coronary artery disease)   . Hyperlipidemia   . Diabetes mellitus   . Hypertension   . Suprapubic catheter   . Peripheral vascular disease   . Arthritis   . Cancer     prostate cancer    Past Surgical History  Procedure Date  . Prostatectomy     radiation therapy  . Bladder cath   . Laminectomy     lumbar  . Lumbar disc surgery   . Right colectomy   . Incisional hernia repair   . [place] artery bypass   . Total hip arthroplasty     RT  . Cataract extraction 2012    Left 03/2011, Right 04/2011    Family History  Problem Relation Age of Onset  . Coronary artery disease      siblings  . Heart attack Father   . Diabetes Sister   . Hyperlipidemia      family history    History  Substance Use Topics  . Smoking status: Former Smoker    Types: Cigarettes    Quit date: 06/10/1970  . Smokeless tobacco: Not on file  . Alcohol Use: No      Review of Systems  Unable to perform ROS   Allergies  Levaquin; Codeine; and Penicillins  Home Medications   Current Outpatient Rx  Name Route Sig Dispense Refill  . ATORVASTATIN CALCIUM 80 MG PO TABS Oral Take 80 mg by mouth daily.    Marland Kitchen DIGOXIN 0.125 MG PO TABS Oral Take 125 mcg by mouth daily. Hold if HR <60    . DIPHENHYDRAMINE HCL 25 MG PO TABS Oral Take 25 mg by mouth every evening. For 5 days Ends 6/1    . OMEGA-3 FATTY ACIDS 1000 MG PO CAPS Oral Take 1 g by mouth daily.      .  FUROSEMIDE 20 MG PO TABS Oral Take 20 mg by mouth 2 (two) times daily.    . INSULIN ASPART 100 UNIT/ML [place] SOLN Subcutaneous Inject 5 Units into the skin 3 (three) times daily with meals. If CBG> 150    . METFORMIN HCL 500 MG PO TABS Oral Take 500 mg by mouth 2 (two) times daily.    Marland Kitchen METOPROLOL SUCCINATE ER 50 MG PO TB24 Oral Take 50 mg by mouth 2 (two) times daily. Take with or immediately following a meal.    . OMEPRAZOLE MAGNESIUM 20 MG PO TBEC Oral Take 20 mg by mouth daily.      . OXYCODONE-ACETAMINOPHEN 5-325 MG PO TABS Oral Take 1 tablet by mouth every 6 (six) hours as needed. For pain.    Marland Kitchen POTASSIUM CHLORIDE ER 10 MEQ PO TBCR Oral Take 10 mEq by mouth 2 (two) times daily.    Marland Kitchen PREDNISONE 10 MG PO TABS Oral Take 30 mg by mouth daily. Take 3 tablets for 2 days, then Take 2 tablets for 2 days, then Take 1 tablet for 2 days  and then stop End 6/3    . RAMIPRIL 10 MG PO TABS Oral Take 10 mg by mouth daily.    . TRIAMCINOLONE ACETONIDE 0.1 % EX OINT Topical Apply 1 application topically 2 (two) times daily. For 5 days End 6/2    . WARFARIN SODIUM 3 MG PO TABS Oral Take 3 mg by mouth daily.      BP 164/78  Pulse 69  Temp(Src) 98.4 F (36.9 C) (Oral)  SpO2 98%  Physical Exam  Constitutional: He appears well-developed and well-nourished. No distress.  HENT:  Head: Normocephalic and atraumatic.  Eyes: Conjunctivae and EOM are normal. Pupils are equal, round, and reactive to light. No scleral icterus.  Neck: Normal range of motion. Neck supple.  Pulmonary/Chest: Effort normal. No respiratory distress.  Abdominal: Soft.  Genitourinary:       Suprapubic cath in place  Musculoskeletal: Normal range of motion. He exhibits no edema and no tenderness.  Neurological: He is alert. He has normal reflexes.  Skin: Skin is warm and dry. No rash noted. He is not diaphoretic.  Psychiatric: He has a normal mood and affect. His behavior is normal. Judgment and thought content normal.    ED Course   Procedures (including critical care time)  Labs Reviewed - No data to display No results found.   1. Foley catheter problem       MDM  D/ced from hospital with pelvic fx today.  Sent to ED because foley catheter was leaking.  Pt with dementia and without complaint.  VSS.  Suprapubic cath exchanged by MD.  No abdominal ttp.  Ready for d/c to nursing home.        Army Chaco, MD 11/05/11 2225

## 2011-11-05 NOTE — ED Notes (Signed)
Dr. Maisie Fus replaced suprapubic catheter.

## 2011-11-05 NOTE — Progress Notes (Signed)
11/05/11 1335  Pt. D/C'd to SNF- via stretcher by EMS attendants;no problems noted;no change skin status. Wife in room with pt. Report called to [facility] at [facility].   Leandrew Koyanagi Valory Wetherby,RN

## 2011-11-05 NOTE — Progress Notes (Signed)
Clinical Social Worker facilitated pt discharge needs including sending pt discharge information via [facility], discussing with pt and pt wife at bedside, and arranging ambulance transportation for pt to [facility]. No further social work needs identified at this time. Clinical Social Worker signing off.   Jacklynn Lewis, MSW, LCSWA  Clinical Social Work [phone]

## 2011-11-05 NOTE — ED Notes (Signed)
Placed call to [facility] for transport back home 

## 2011-11-05 NOTE — Discharge Instructions (Signed)
Foley Catheter Care, Adult A soft, flexible tube (Foley catheter) has been placed in your bladder. This may be done to temporarily help with urine drainage after an operation or to relieve blockage from an enlarged prostate gland. HOME CARE INSTRUCTIONS  If you are going home with a Foley catheter in place, follow these instructions: Taking Care of the Catheter:  Keep the area where the catheter leaves your body clean.   Attach the catheter to the leg so there is no tension on the catheter.   Keep the drainage bag below the level of the bladder, but keep it OFF the floor.   Do not take long soaking baths. Your caregiver will give instructions about showering.   Wash your hands before touching ANYTHING related to the catheter or bag.   Using mild soap and warm water on a washcloth:   Clean the area closest to the catheter insertion site using a circular motion around the catheter.   Clean the catheter itself by wiping AWAY from the insertion site for several inches down the tube.   NEVER wipe upward as this could sweep bacteria up into the urethra (tube in your body that normally drains the bladder) and cause infection.  Taking Care of the Drainage Bags:  Two drainage bags will be taken home: a large overnight drainage bag, and a smaller leg bag which fits underneath clothing.   It is okay to wear the overnight bag at any time, but NEVER wear the smaller leg bag at night.   Keep the drainage bag well below the level of your bladder. This prevents backflow of urine into the bladder and allows the urine to drain freely.   Anchor the tubing to your leg to prevent pulling or tension on the catheter. Use tape or a leg strap provided by the hospital.   Empty the drainage bag when it is  to  full. Wash your hands before and after touching the bag.   Periodically check the tubing for kinks to make sure there is no pressure on the tubing which could restrict the flow of urine.  Changing  the Drainage Bags:  Cleanse both ends of the clean bag with alcohol before changing.   Pinch off the rubber catheter to avoid urine spillage during the disconnection.   Disconnect the dirty bag and connect the clean one.   Empty the dirty bag carefully to avoid a urine spill.   Attach the new bag to the leg with tape or a leg strap.  Cleaning the Drainage Bags:  Whenever a drainage bag is disconnected, it must be cleaned quickly so it is ready for the next use.   Wash the bag in warm, soapy water.   Rinse the bag thoroughly with warm water.   Soak the bag for 30 minutes in a solution of white vinegar and water (1 cup vinegar to 1 quart warm water).   Rinse with warm water.  SEEK MEDICAL CARE IF:   Some pain develops in the kidney (lower back) area.   The urine is cloudy or smells bad.   There is some blood in the urine.   The catheter becomes clogged and/or there is no urine drainage.  SEEK IMMEDIATE MEDICAL CARE IF:   You have moderate or severe pain in the kidney region.   You start to throw up (vomit).   Blood fills the tube.   Worsening belly (abdominal) pain develops.   You have a fever.  MAKE SURE YOU:  You have a fever.  MAKE SURE YOU:    Understand these instructions.   Will watch your condition.   Will get help right away if you are not doing well or get worse.  Document Released: 05/27/2005 Document Revised: 05/16/2011 Document Reviewed: 11/21/2006  ExitCare Patient Information 2012 [employer].

## 2011-11-05 NOTE — Progress Notes (Signed)
Physical Therapy Treatment Patient Details Name: [name] MRN: [id number] DOB: [birth year] Today's Date: 11/05/2011 Time: 4098-1191 PT Time Calculation (min): 16 min  PT Assessment / Plan / Recommendation Comments on Treatment Session  Pt adm with UTI and found to have rt hip fx. No surgery planned until UTI resolved. Pt made NWB on RLE. Pt with slow improvements during all bed mobility and transfers. Pt able to maintain NWB status althgouh difficulty with completing all mobility with LLE only due to weakness. Continue for strengthening and safety.    Follow Up Recommendations  Skilled nursing facility       Equipment Recommendations  Defer to next venue       Frequency Min 3X/week   Plan Discharge plan remains appropriate;Frequency remains appropriate    Precautions / Restrictions Precautions Precautions: Fall Restrictions Weight Bearing Restrictions: Yes RLE Weight Bearing: Non weight bearing       Mobility  Bed Mobility Bed Mobility: Supine to Sit Supine to Sit: 1: +2 Total assist Supine to Sit: Patient Percentage: 50% Sitting - Scoot to Edge of Bed: 2: Max assist Details for Bed Mobility Assistance: VC for sequencing. Assist x2 through drawsheet to assist pt into sitting. Cues for anterior translation as pt with posterior lean upon initial sit. Cues throughout for hand placement and sequencing Transfers Transfers: Sit to Stand;Stand to Sit;Stand Pivot Transfers Sit to Stand: 1: +2 Total assist;With upper extremity assist;From bed Sit to Stand: Patient Percentage: 60% Stand to Sit: 1: +2 Total assist;With upper extremity assist;To chair/3-in-1 Stand to Sit: Patient Percentage: 60% Stand Pivot Transfers: 1: +2 Total assist Stand Pivot Transfers: Patient Percentage: 50% Details for Transfer Assistance: VC throughout treatment for proper sequencing and pivoting on LLE. Therapists foot below pts on RLE to maintain NWB status. VC for safety during transfer from bed to  chair with RW. Ambulation/Gait Ambulation/Gait Assistance: Not tested (comment)      PT Goals Acute Rehab PT Goals PT Goal: Supine/Side to Sit - Progress: Progressing toward goal PT Goal: Sit at Edge Of Bed - Progress: Progressing toward goal PT Goal: Sit to Supine/Side - Progress: Progressing toward goal PT Transfer Goal: Bed to Chair/Chair to Bed - Progress: Met  Visit Information  Last PT Received On: 11/05/11 Assistance Needed: +2    Subjective Data  Subjective: Pt stated that he had no hip pain today   Cognition  Overall Cognitive Status: Appears within functional limits for tasks assessed/performed Arousal/Alertness: Awake/alert Orientation Level: Appears intact for tasks assessed Behavior During Session: [facility] for tasks performed       End of Session PT - End of Session Equipment Utilized During Treatment: Gait belt Activity Tolerance: Patient tolerated treatment well Patient left: in chair;with call bell/phone within reach;with family/visitor present Nurse Communication: Mobility status    Milana Kidney 11/05/2011, 9:35 AM  11/05/2011 Milana Kidney DPT PAGER: [phone] OFFICE: [phone]

## 2011-11-05 NOTE — ED Notes (Signed)
The pt is alert but he is confused he thinks his wife is upstairs.  He says yes when asked if he knows where he is but does not say where he is

## 2011-11-05 NOTE — Progress Notes (Signed)
ANTICOAGULATION CONSULT NOTE - Follow Up Consult  Pharmacy Consult for Warfarin Indication: atrial fibrillation  Allergies  Allergen Reactions  . Levaquin (Levofloxacin) Rash  . Codeine Other (See Comments)    unknown  . Penicillins Other (See Comments)    unknown    Patient Measurements: Height: 5\' 9"  (175.3 cm) Weight: 163 lb 14.4 oz (74.345 kg) IBW/kg (Calculated) : 70.7   Vital Signs: Temp: 97.9 F (36.6 C) (05/28 0500) Temp src: Oral (05/28 0500) BP: 159/84 mmHg (05/28 0500) Pulse Rate: 65  (05/28 0500)  Labs:  Alvira Philips 11/05/11 0625 11/04/11 0625 11/03/11 0457  HGB -- 9.1* --  HCT -- 27.7* --  PLT -- 359 --  APTT -- -- --  LABPROT 24.6* 31.6* 29.9*  INR 2.18* 3.00* 2.79*  HEPARINUNFRC -- -- --  CREATININE -- 0.71 --  CKTOTAL -- -- --  CKMB -- -- --  TROPONINI -- -- --    Estimated Creatinine Clearance: 77.3 ml/min (by C-G formula based on Cr of 0.71).  Assessment: 78 y.o. M resumed on warfarin from PTA for history of Afib. Patient found to have increased warfarin sensitivity this admission requiring doses to be held - Levaquin interaction possibly, also had a 5 mg and 7.5 mg dose at the beginning since INR was subtherapeutic on admit.  Warfarin was resumed on 5/24 and the INR remains therapeutic this a.m, now trending down. Prednisone was started on 5/24 which can increase warfarin sensitivity (but low interaction). PTA dose was known to be 3 mg daily. CBC stable 5/27.  No s/sx of bleeding noted. The patient has been re-educated this admission.  Goal of Therapy:  INR 2-3 Monitor platelets by anticoagulation protocol: Yes   Plan:  1. Warfarin 3 mg po x 1 dose 2. Will continue to monitor for any signs/symptoms of bleeding and will follow up with PT/INR in the a.m.   Rolland Porter, Pharm.D., BCPS Clinical Pharmacist Pager: [phone] 11/05/2011 8:32 AM

## 2011-11-05 NOTE — ED Notes (Signed)
Report called  Back to [facility].  [facility] called to transport back also.  The pt has dementia

## 2011-11-05 NOTE — Discharge Summary (Signed)
PATIENT DETAILS Name: [name] Age: 78 y.o. Sex: male Date of Birth: 04/29/1932 MRN: [id number]. Admit Date: 10/23/2011 Admitting Physician: Eduard Clos, MD WUJ:[id number],GNFAOZHYQ, MD, MD  PRIMARY DISCHARGE DIAGNOSIS:  Principal Problem:  *UTI (lower urinary tract infection) Active Problems:  Maculo-papular rash-secondary to Levaquin  Acute Diastolic Decompensation  DIABETES MELLITUS, TYPE II  CAD, ARTERY BYPASS GRAFT  Atrial fibrillation  Fever  Stage II Sacral Decubitus  Suacute fractures right acetabulum and rami with protrusio of hip prothesis.   PAST MEDICAL HISTORY: Past Medical History  Diagnosis Date  . A-fib   . CAD (coronary artery disease)   . Hyperlipidemia   . Diabetes mellitus   . Hypertension   . Suprapubic catheter   . Peripheral vascular disease   . Arthritis   . Cancer     prostate cancer    DISCHARGE MEDICATIONS: Medication List  As of 11/05/2011 10:07 AM   STOP taking these medications         ACCU-CHEK AVIVA PLUS test strip      AMBULATORY NON FORMULARY MEDICATION      amitriptyline 50 MG tablet      Blood Glucose Monitoring Suppl Misc      gabapentin 300 MG capsule      ramipril 10 MG capsule         TAKE these medications         atorvastatin 80 MG tablet   Commonly known as: LIPITOR   Take 80 mg by mouth daily.      digoxin 0.125 MG tablet   Commonly known as: LANOXIN   Take 125 mcg by mouth daily.      diphenhydrAMINE 25 mg capsule   Commonly known as: BENADRYL   Take 1 capsule (25 mg total) by mouth daily at 8 pm. Take for 5 more days from 5/28 and then stop      feeding supplement Liqd   Take 237 mLs by mouth 3 (three) times daily between meals.      fish oil-omega-3 fatty acids 1000 MG capsule   Take 1 g by mouth daily.      furosemide 20 MG tablet   Commonly known as: LASIX   Take 1 tablet (20 mg total) by mouth 2 (two) times daily.      insulin aspart 100 UNIT/ML injection   Commonly known as:  novoLOG   Inject 0-15 Units into the skin 3 (three) times daily with meals. 0-15 Units, Subcutaneous, 3 times daily with meals  CBG < 70: implement hypoglycemia protocol  CBG 70 - 120: 0 units  CBG 121 - 150: 2 units  CBG 151 - 200: 3 units  CBG 201 - 250: 5 units  CBG 251 - 300: 8 units  CBG 301 - 350: 11 units  CBG 351 - 400: 15 units  CBG > 400: call MD      metFORMIN 500 MG tablet   Commonly known as: GLUCOPHAGE   Take 1 tablet (500 mg total) by mouth 2 (two) times daily with a meal.      metoprolol succinate 50 MG 24 hr tablet   Commonly known as: TOPROL-XL   Take 1 tablet (50 mg total) by mouth 2 (two) times daily. Take with or immediately following a meal.      omeprazole 20 MG tablet   Commonly known as: PRILOSEC OTC   Take 20 mg by mouth daily.      oxyCODONE-acetaminophen 5-325 MG per tablet   Commonly  known as: PERCOCET   Take 1-2 tablets by mouth every 6 (six) hours as needed.      potassium chloride 10 MEQ tablet   Commonly known as: K-DUR   Take 1 tablet (10 mEq total) by mouth 2 (two) times daily.      predniSONE 10 MG tablet   Commonly known as: DELTASONE   Take 3 tablets (30 mg total) by mouth daily with breakfast. Take 3 tablets for 2 days, then  Take 2 tablets for 2 days, then  Take 1 tablet for 2 days and then stop      ramipril 10 MG tablet   Commonly known as: ALTACE   Take 10 mg by mouth daily.      triamcinolone ointment 0.1 %   Commonly known as: KENALOG   Apply topically 2 (two) times daily. -apply for 4-5 days and then stop      warfarin 3 MG tablet   Commonly known as: COUMADIN   Take 1 tablet (3 mg total) by mouth daily.             BRIEF HPI:  See H&P, Labs, Consult and Test reports for all details in brief,78 year-old male with known history of prostate cancer status post radiation and is on suprapubic catheter, diabetes mellitus type 2, CAD status post CABG, atrial fibrillation on Coumadin was brought to the ER  because of increasing fever and chills over the last 48 hours. He was found to have a urinary tract infection and admitted to the hospital for intravenous antibiotics. Further evaluation resulted in a x-ray of the right hip which showed protrusion and rotation of acetabular cuff-and a possible fracture from a fall that he sustained a few weeks prior to admission. For further details please see the history and physical that was done on admission.  CONSULTATIONS:   ID and orthopedic surgery  PERTINENT RADIOLOGIC STUDIES: Dg Chest 2 View  10/23/2011  *RADIOLOGY REPORT*  Clinical Data: Fatigue.  Fever.  Altered level of consciousness.  CHEST - 2 VIEW  Comparison: 05/09/2009  Findings: Prior CABG noted.  Mild cardiomegaly is present with indistinct pulmonary vasculature and cephalization of blood flow suggesting pulmonary venous hypertension.  Overt edema is not observed.  Linear subsegmental atelectasis noted at the left lung base.  Atherosclerotic calcification of the aortic arch is noted.  No pleural effusion is identified.  IMPRESSION:  1.  Cardiomegaly with pulmonary venous hypertension.  No overt edema. 2.  Atherosclerosis.  Original Report Authenticated By: Dellia Cloud, M.D.   Dg Hip Complete Right  10/24/2011  *RADIOLOGY REPORT*  Clinical Data: Right hip pain.  Remote hip surgery.  RIGHT HIP - COMPLETE 2+ VIEW  Comparison: 05/10/2009.  Findings: Interval protrusio of the right hip prosthesis with areas of lucency and sclerosis around the acetabular cup.  The acetabular cup has rotated into a more vertical position.  The femoral component is intact.  No findings for loosening or fracture.  There appear to be pathologic or stress fractures involving the obturator ring.  Inferior pubic ramus fracture noted and probable fracture at the ischiopubic junction. CT recommended for further evaluation and preoperative planning.  IMPRESSION:  1.  Protrusio and rotation of the acetabular cup. 2.  Pathologic  /stress fractures involving the obturator ring. Cannot exclude underlying particle disease or infection.  CT recommended for further evaluation.  Original Report Authenticated By: P. Loralie Champagne, M.D.   Mr Brain Wo Contrast  10/29/2011  *RADIOLOGY REPORT*  Clinical Data:  Right-sided weakness.  Rule out stroke.  MRI HEAD WITHOUT CONTRAST  Technique:  Multiplanar, multiecho pulse sequences of the brain and surrounding structures were obtained according to standard protocol without intravenous contrast.  Comparison: None.  Findings: Image quality degraded by patient motion.  Negative for acute infarct.  Atrophy and moderate chronic microvascular ischemic changes in the white matter.  Brainstem and cerebellum are intact.  Basal ganglia is intact.  Negative for hemorrhage or mass lesion.  Mucosal thickening in the paranasal sinuses compatible with chronic sinusitis.  IMPRESSION: Negative for acute infarct.  Atrophy and chronic microvascular ischemic change.  Sinusitis.  Original Report Authenticated By: Camelia Phenes, M.D.   Ct Hip Right Wo Contrast  10/24/2011  *RADIOLOGY REPORT*  Clinical Data: Right hip pain.  Obturator ring fracture.  CT OF THE RIGHT HIP WITHOUT CONTRAST  Technique:  Multidetector CT imaging was performed according to the standard protocol. Multiplanar CT image reconstructions were also generated.  Comparison: Radiographs dated 10/24/2011  Findings: There is a comminuted displaced fracture of the right acetabulum.  There is also a fragmented fracture of the medial aspect of the right inferior pubic ramus and a nondisplaced fracture of the superior pubic ramus adjacent to the acetabulum.  The acetabular component of the total hip prosthesis has migrated superiorly within the ilium.  The screws extend into the iliacus muscle.  There is what appears to be an old fracture of the right ilium at the posterior inferior aspect of the sacroiliac joint. There is high density material within the  inferior aspect of the acetabulum which may either represent cement or old hemorrhage. There is enlargement of the right obturator internus muscle probably due to hemorrhage secondary to the pubic rami fractures.  There is also some dystrophic calcification in the soft tissues posterior to the right superior pubic ramus which may represent dystrophic calcification in old hemorrhage.  The femoral component of the right total hip prosthesis appears in good position with no evidence of loosening.  Proximal femur is intact.  Suprapubic catheter is in place.  IMPRESSION: Comminuted fractures of the right acetabulum with a large defect in the medial wall.  Fractures of the right inferior and superior pubic rami.  Incompletely healed fracture of the posterior aspect of the right ilium adjacent to the inferior aspect of the right SI joint.  Original Report Authenticated By: Gwynn Burly, M.D.   Dg Chest Port 1 View  10/30/2011  *RADIOLOGY REPORT*  Clinical Data: Shortness of breath  PORTABLE CHEST - 1 VIEW  Comparison: 10/28/2011  Findings: Cardiomediastinal silhouette is stable.  Status post CABG again noted.  Stable left arm PICC line position.  Persistent mild pulmonary edema right greater than left.  Question small right pleural effusion with right basilar atelectasis or infiltrate.  IMPRESSION:  Persistent mild pulmonary edema right greater than left.  Question small right pleural effusion with right basilar atelectasis or infiltrate.  Original Report Authenticated By: Natasha Mead, M.D.   Dg Chest Port 1 View  10/28/2011  *RADIOLOGY REPORT*  Clinical Data: Bedside PICC placement.  PORTABLE CHEST - 1 VIEW 10/28/2011 2300 hours:  Comparison: Portable chest x-ray earlier same date 2138 hours.  Findings: Left arm PICC tip in the SVC.  Prior sternotomy for CABG. Cardiac silhouette enlarged but stable.  Moderate diffuse interstitial pulmonary edema, slightly increased since the earlier examination.  Possible small  pleural effusions.  IMPRESSION:  1.  Left arm PICC tip in the SVC. 2.  Progressive interstitial pulmonary edema  since the examination earlier tonight.  Original Report Authenticated By: Arnell Sieving, M.D.   Dg Chest Port 1 View  10/28/2011  *RADIOLOGY REPORT*  Clinical Data: Chest congestion.  Wheezing.  PORTABLE CHEST - 1 VIEW 10/28/2011 2138 hours:  Comparison: Two-view chest x-ray 10/23/2011, 05/09/2009.  Findings: Prior sternotomy for CABG.  Cardiac silhouette enlarged but stable.  Interval development of pulmonary venous hypertension and mild interstitial pulmonary edema since the examination 5 days ago.  Right pleural effusion suspected.  No confluent airspace consolidation.  IMPRESSION: Mild CHF and/or fluid overload, with new mild interstitial pulmonary edema and probable right pleural effusion.  Original Report Authenticated By: Arnell Sieving, M.D.   Dg Swallowing Func-no Report  10/31/2011  CLINICAL DATA: dysphagia   FLUOROSCOPY FOR SWALLOWING FUNCTION STUDY:  Fluoroscopy was provided for swallowing function study, which was  administered by a speech pathologist.  Final results and recommendations  from this study are contained within the speech pathology report.       PERTINENT LAB RESULTS: CBC:  Basename 11/04/11 0625  WBC 10.5  HGB 9.1*  HCT 27.7*  PLT 359   CMET CMP     Component Value Date/Time   NA 138 11/04/2011 0625   K 3.1* 11/04/2011 0625   CL 101 11/04/2011 0625   CO2 30 11/04/2011 0625   GLUCOSE 125* 11/04/2011 0625   BUN 19 11/04/2011 0625   CREATININE 0.71 11/04/2011 0625   CREATININE 0.96 04/03/2011 0857   CALCIUM 8.2* 11/04/2011 0625   PROT 6.0 10/24/2011 0534   ALBUMIN 2.8* 10/24/2011 0534   AST 18 10/24/2011 0534   ALT 13 10/24/2011 0534   ALKPHOS 63 10/24/2011 0534   BILITOT 0.7 10/24/2011 0534   GFRNONAA 88* 11/04/2011 0625   GFRAA >90 11/04/2011 0625    GFR Estimated Creatinine Clearance: 77.3 ml/min (by C-G formula based on Cr of 0.71). No  results found for this basename: LIPASE:2,AMYLASE:2 in the last 72 hours No results found for this basename: CKTOTAL:3,CKMB:3,CKMBINDEX:3,TROPONINI:3 in the last 72 hours No components found with this basename: POCBNP:3 No results found for this basename: DDIMER:2 in the last 72 hours No results found for this basename: HGBA1C:2 in the last 72 hours No results found for this basename: CHOL:2,HDL:2,LDLCALC:2,TRIG:2,CHOLHDL:2,LDLDIRECT:2 in the last 72 hours No results found for this basename: TSH,T4TOTAL,FREET3,T3FREE,THYROIDAB in the last 72 hours No results found for this basename: VITAMINB12:2,FOLATE:2,FERRITIN:2,TIBC:2,IRON:2,RETICCTPCT:2 in the last 72 hours Coags:  Basename 11/05/11 0625 11/04/11 0625  INR 2.18* 3.00*   Microbiology: Recent Results (from the past 240 hour(s))  CULTURE, BLOOD (ROUTINE X 2)     Status: Normal   Collection Time   10/29/11  4:10 PM      Component Value Range Status Comment   Specimen Description BLOOD HAND RIGHT   Final    Special Requests BOTTLES DRAWN AEROBIC ONLY 10CC   Final    Culture  Setup Time [id number]   Final    Culture NO GROWTH 5 DAYS   Final    Report Status 11/05/2011 FINAL   Final   URINE CULTURE     Status: Normal   Collection Time   10/29/11  6:42 PM      Component Value Range Status Comment   Specimen Description URINE, CATHETERIZED   Final    Special Requests NONE   Final    Culture  Setup Time [id number]   Final    Colony Count NO GROWTH   Final    Culture NO GROWTH  Final    Report Status 10/30/2011 FINAL   Final   CLOSTRIDIUM DIFFICILE BY PCR     Status: Normal   Collection Time   11/01/11  5:41 PM      Component Value Range Status Comment   C difficile by pcr NEGATIVE  NEGATIVE  Final      BRIEF HOSPITAL COURSE:   Principal Problem: UTI  -Complicated, suprapubic catheter related UTI.  -Catheter was changed in the emergency department at the time of admission.  -Was spiking fever while on Zosyn, vancomycin  added as patient had history of staph UTI.  -Blood cultures obtained, initial urine culture showing multiple morphotypes, repeat urine culture was negative -Seen by ID on 5/21-recommended transition to Levaquin, -however patient developed a rash after levaquin started and all antibiotics were then stoppped. He did complete 7 days of antibiotics.He now has been without any fever for 5 days.  Active Problems:  Maculopapular rash  -hospital course complicated by development of rash  -likey from  Levaquin  -started on prednisone-5/23-continue as outlined as above on discharged -spoke with Dr Vernona Rieger on call 5/24-clinical and labs discussed-likely drug rash-she recommended to continue with steroids, add benadryl and topical triamcinolone. Likely needs to run its course, to call back Dermatology if target lesions develop or mucus membranes get involved. -rash was involving his face (no mucus membrane involvement), neck, torso and b/l upper extremities-currently with steroids and benadryl, this is significantly better, and no new lesions have cropped up in the past 2 -3 days, in some areas the rash is actually started to fade away -continue with tapering prednisone and benadryl as outlined above  Chronic pain and weakness in the right hip  -X-ray showed protrusion and migration of the acetabular cup  -Stress fracture of the obturator ring.  -Orthopedics recommended to followup with him as outpatient, he'll likely need surgery in 2 weeks from the time of discharge, please see discharge instructions below.  Acute Pulmonary Edema  -hospital course also complicated by pulmonary edema likely secondary to IVF and diastolic dysfunction decompensation -All IV fluids were stopped -Was given multiple doses of IV lasix during his hospital stay, now will transition to low dose lasix at time of discharge - 2 D echo showed-function was normal. The estimated ejection fraction was in the range of 55% to  60%. Wall motion was normal; there were no regional wall motion abnormalities.  Diabetes mellitus type 2  -Controlled with hemoglobin of 6.9. -resume metformin on discharge -continue with SSI while at SNF  Atrial fibrillation  -Rate controlled.Continue with Digoxin and metoprolol  -On chronic Coumadin, pharmacy is to dose of Coumadin. -will need frequent INR checks at SNF  HTN  -continue with Metoprolol  -resume Ramipril at discharge   Dyslipidemia  -stop statins-resume at discharge when rash much better   Peripheral Neuropathy  -stop neurontin, and resume at discharge when rash much better   History of prostate cancer  -Patient had previous treatment. PSA is suppressed.    Stage II pressure ulcer -The coccyx is partial thickness and should reepithelialize with topical therapy, positioning and implementation of a low air loss sleep surface. Heel boots are provided as are orders for a topical dressing (silicone) to the right lateral heel.  Dyslipidemia  -stop statins-resume at discharge when rash much better   History of prostate cancer  -Patient had previous treatment. PSA is suppressed   TODAY-DAY OF DISCHARGE:  Subjective:   Jeb Levering today has no headache,no chest abdominal pain,no  new weakness tingling or numbness, feels much better wants to go home today. He has now been persistently afebrile and his rash is significantly better.  Objective:   Blood pressure 164/69, pulse 83, temperature 97.9 F (36.6 C), temperature source Oral, resp. rate 20, height 5\' 9"  (1.753 m), weight 74.345 kg (163 lb 14.4 oz), SpO2 98.00%.  Intake/Output Summary (Last 24 hours) at 11/05/11 1007 Last data filed at 11/05/11 0700  Gross per 24 hour  Intake    420 ml  Output   2525 ml  Net  -2105 ml    Exam Awake Alert, Oriented *3, No new F.N deficits, Normal affect [place].AT,PERRAL Supple Neck,No JVD, No cervical lymphadenopathy appriciated.  Symmetrical Chest wall movement, Good  air movement bilaterally, CTAB RRR,No Gallops,Rubs or new Murmurs, No Parasternal Heave +ve B.Sounds, Abd Soft, Non tender, No organomegaly appriciated, No rebound -guarding or rigidity. No Cyanosis, Clubbing or edema  DISPOSITION: SNF  DISCHARGE INSTRUCTIONS:    1. Diet-Thin liquid;Dysphagia 3 (mechanical soft)  2. Activity-NWB to RLE 3. Wound care-as above 4. Needs PT/INR checked atleast 3 times a week as patient on coumdain 5. Needs frequent BMET atleast for the first few weeks-weekly to check K  Follow-up Information    Follow up with Loanne Drilling, MD. Schedule an appointment as soon as possible for a visit in 2 weeks.   Contact information:   [facility] [address], [address] [place] [address] [phone]       Follow up with METHENEY,CATHERINE, MD. Schedule an appointment as soon as possible for a visit in 1 week. (after discharge from SNF)    Contact information:   [address]  [address]  [place] [address] [phone]          Total Time spent on discharge equals 45 minutes.  SignedJeoffrey Massed 11/05/2011 10:07 AM

## 2011-11-05 NOTE — ED Notes (Signed)
The pt arrived by [facility] from [facility] with a leaking catheter.  The pt had a recent frac of the lt hip and just left this hospital this am and placed into the [facility] nursing home.  Pt alert

## 2011-11-06 NOTE — ED Provider Notes (Signed)
I saw and evaluated the patient, reviewed the resident's note and I agree with the findings and plan. Pt with leaking suprapubic cath which has been in place for 13 years.  Tube replaced and pt has no other problems at this time.  Gwyneth Sprout, MD 11/06/11 2208

## 2011-12-02 ENCOUNTER — Emergency Department (HOSPITAL_COMMUNITY): Payer: Medicare Other

## 2011-12-02 ENCOUNTER — Encounter (HOSPITAL_COMMUNITY): Payer: Self-pay

## 2011-12-02 ENCOUNTER — Inpatient Hospital Stay (HOSPITAL_COMMUNITY)
Admission: EM | Admit: 2011-12-02 | Discharge: 2011-12-05 | DRG: 240 | Disposition: A | Discharge status: Skilled Nursing Facility | Payer: Medicare Other | Source: Ambulatory Visit | Attending: Vascular Surgery | Admitting: Vascular Surgery

## 2011-12-02 DIAGNOSIS — E1165 Type 2 diabetes mellitus with hyperglycemia: Secondary | ICD-10-CM

## 2011-12-02 DIAGNOSIS — I70269 Atherosclerosis of native arteries of extremities with gangrene, unspecified extremity: Secondary | ICD-10-CM | POA: Diagnosis present

## 2011-12-02 DIAGNOSIS — Z96649 Presence of unspecified artificial hip joint: Secondary | ICD-10-CM

## 2011-12-02 DIAGNOSIS — C61 Malignant neoplasm of prostate: Secondary | ICD-10-CM | POA: Diagnosis present

## 2011-12-02 DIAGNOSIS — I4891 Unspecified atrial fibrillation: Secondary | ICD-10-CM | POA: Diagnosis present

## 2011-12-02 DIAGNOSIS — D689 Coagulation defect, unspecified: Secondary | ICD-10-CM | POA: Diagnosis present

## 2011-12-02 DIAGNOSIS — R5381 Other malaise: Secondary | ICD-10-CM | POA: Diagnosis present

## 2011-12-02 DIAGNOSIS — I714 Abdominal aortic aneurysm, without rupture: Secondary | ICD-10-CM

## 2011-12-02 DIAGNOSIS — I2510 Atherosclerotic heart disease of native coronary artery without angina pectoris: Secondary | ICD-10-CM

## 2011-12-02 DIAGNOSIS — E46 Unspecified protein-calorie malnutrition: Secondary | ICD-10-CM | POA: Diagnosis present

## 2011-12-02 DIAGNOSIS — E785 Hyperlipidemia, unspecified: Secondary | ICD-10-CM | POA: Diagnosis present

## 2011-12-02 DIAGNOSIS — E1159 Type 2 diabetes mellitus with other circulatory complications: Principal | ICD-10-CM | POA: Diagnosis present

## 2011-12-02 DIAGNOSIS — F0390 Unspecified dementia without behavioral disturbance: Secondary | ICD-10-CM | POA: Diagnosis present

## 2011-12-02 DIAGNOSIS — Z951 Presence of aortocoronary bypass graft: Secondary | ICD-10-CM

## 2011-12-02 DIAGNOSIS — D649 Anemia, unspecified: Secondary | ICD-10-CM

## 2011-12-02 DIAGNOSIS — Z7901 Long term (current) use of anticoagulants: Secondary | ICD-10-CM

## 2011-12-02 DIAGNOSIS — I10 Essential (primary) hypertension: Secondary | ICD-10-CM | POA: Diagnosis present

## 2011-12-02 DIAGNOSIS — I25810 Atherosclerosis of coronary artery bypass graft(s) without angina pectoris: Secondary | ICD-10-CM | POA: Diagnosis present

## 2011-12-02 DIAGNOSIS — I743 Embolism and thrombosis of arteries of the lower extremities: Secondary | ICD-10-CM

## 2011-12-02 DIAGNOSIS — IMO0001 Reserved for inherently not codable concepts without codable children: Secondary | ICD-10-CM

## 2011-12-02 DIAGNOSIS — Z4789 Encounter for other orthopedic aftercare: Secondary | ICD-10-CM

## 2011-12-02 DIAGNOSIS — I252 Old myocardial infarction: Secondary | ICD-10-CM

## 2011-12-02 DIAGNOSIS — I998 Other disorder of circulatory system: Secondary | ICD-10-CM

## 2011-12-02 LAB — URINALYSIS, ROUTINE W REFLEX MICROSCOPIC
Glucose, UA: NEGATIVE mg/dL
Protein, ur: NEGATIVE mg/dL
pH: 5 (ref 5.0–8.0)

## 2011-12-02 LAB — POCT I-STAT, CHEM 8
Calcium, Ion: 1.03 mmol/L — ABNORMAL LOW (ref 1.12–1.32)
Chloride: 100 mEq/L (ref 96–112)
Creatinine, Ser: 1 mg/dL (ref 0.50–1.35)
Glucose, Bld: 176 mg/dL — ABNORMAL HIGH (ref 70–99)
HCT: 24 % — ABNORMAL LOW (ref 39.0–52.0)
Potassium: 4.3 mEq/L (ref 3.5–5.1)

## 2011-12-02 LAB — DIFFERENTIAL
Basophils Absolute: 0 10*3/uL (ref 0.0–0.1)
Eosinophils Absolute: 0 10*3/uL (ref 0.0–0.7)
Lymphs Abs: 1.3 10*3/uL (ref 0.7–4.0)
Neutrophils Relative %: 83 % — ABNORMAL HIGH (ref 43–77)

## 2011-12-02 LAB — CBC
MCH: 32.6 pg (ref 26.0–34.0)
Platelets: 252 10*3/uL (ref 150–400)
RBC: 2.67 MIL/uL — ABNORMAL LOW (ref 4.22–5.81)
WBC: 14.3 10*3/uL — ABNORMAL HIGH (ref 4.0–10.5)

## 2011-12-02 LAB — URINE MICROSCOPIC-ADD ON

## 2011-12-02 MED ORDER — ONDANSETRON HCL 4 MG/2ML IJ SOLN: 4.0000 mg | Freq: Four times a day (QID) | INTRAMUSCULAR | Status: DC | PRN

## 2011-12-02 MED ORDER — METOPROLOL TARTRATE 1 MG/ML IV SOLN: 2.0000 mg | INTRAVENOUS | Status: DC | PRN

## 2011-12-02 MED ORDER — SODIUM CHLORIDE 0.9 % IV SOLN
INTRAVENOUS | Status: DC
  Administered 2011-12-02 (×2): via INTRAVENOUS

## 2011-12-02 MED ORDER — HYDRALAZINE HCL 20 MG/ML IJ SOLN: 10.0000 mg | INTRAMUSCULAR | Status: DC | PRN

## 2011-12-02 MED ORDER — SODIUM CHLORIDE 0.9 % IV BOLUS (SEPSIS)
1000.0000 mL | Freq: Once | INTRAVENOUS | Status: AC
  Administered 2011-12-02: 1000 mL via INTRAVENOUS

## 2011-12-02 MED ORDER — VANCOMYCIN HCL IN DEXTROSE 1-5 GM/200ML-% IV SOLN
1000.0000 mg | Freq: Two times a day (BID) | INTRAVENOUS | Status: DC
  Administered 2011-12-02 – 2011-12-03 (×2): 1000 mg via INTRAVENOUS
  Filled 2011-12-02 (×3): qty 200

## 2011-12-02 MED ORDER — MORPHINE SULFATE 2 MG/ML IJ SOLN
2.0000 mg | INTRAMUSCULAR | Status: DC | PRN
  Administered 2011-12-03: 2 mg via INTRAVENOUS
  Filled 2011-12-02: qty 1

## 2011-12-02 MED ORDER — LABETALOL HCL 5 MG/ML IV SOLN: 10.0000 mg | INTRAVENOUS | Status: DC | PRN

## 2011-12-02 MED ORDER — SODIUM CHLORIDE 0.9 % IV SOLN
INTRAVENOUS | Status: DC
  Administered 2011-12-02: 23:00:00 via INTRAVENOUS

## 2011-12-02 MED ORDER — PHENOL 1.4 % MT LIQD: 1.0000 | OROMUCOSAL | Status: DC | PRN

## 2011-12-02 MED ORDER — GUAIFENESIN-DM 100-10 MG/5ML PO SYRP: 15.0000 mL | ORAL_SOLUTION | ORAL | Status: DC | PRN

## 2011-12-02 NOTE — ED Notes (Signed)
MD at bedside. 

## 2011-12-02 NOTE — Consult Note (Signed)
Reason for Consult:Medical Management. Referring Physician: Dr.Fields.  [name] is an 78 y.o. male.  HPI: 78 year old male with known history of a fibrillation on Coumadin, CAD status post CABG, hypertension, diabetes mellitus2, chronic indwelling catheter status post treatment for prostate cancer who lives in a nursing home was found to have increasing foot pain since last night. Patient is complaining of pain in the right foot which gradually worsened from last night and today and patient's wife removed his socks from his right foot it was found to be cold and erythematous. She was brought to the ER at [facility]. Dr.Fields vascular surgeon on-call has already seen the patient and I have discussed with Dr.Fields. Dr.Fields at this time feel that patient has ischemic right foot and may probably be taken to OR for possible amputation. But [name] is going to reassess the patient after the FFP's and given to reverse the INR which was high due to to Coumadin. Patient in addition has been noticed to have some diarrhea and has been on antibiotics for his UTI. Patient denies any chest pain shortness of breath abdominal pain nausea vomiting.  Past Medical History  Diagnosis Date  . A-fib   . CAD (coronary artery disease)   . Hyperlipidemia   . Diabetes mellitus   . Hypertension   . Suprapubic catheter   . Peripheral vascular disease   . Arthritis   . Cancer     prostate cancer    Past Surgical History  Procedure Date  . Prostatectomy     radiation therapy  . Bladder cath   . Laminectomy     lumbar  . Lumbar disc surgery   . Right colectomy   . Incisional hernia repair   . Oronary artery bypass   . Total hip arthroplasty     RT  . Cataract extraction 2012    Left 03/2011, Right 04/2011    Family History  Problem Relation Age of Onset  . Coronary artery disease      siblings  . Heart attack Father   . Diabetes Sister   . Hyperlipidemia      family history    Social  History:  reports that he quit smoking about 41 years ago. His smoking use included Cigarettes. He does not have any smokeless tobacco history on file. He reports that he does not drink alcohol or use illicit drugs.  Allergies:  Allergies  Allergen Reactions  . Levaquin (Levofloxacin) Rash  . Codeine Other (See Comments)    Noted on MAR  . Penicillins Other (See Comments)    Noted on MAR    Medications: I have reviewed the patient's current medications.  Results for orders placed during the hospital encounter of 12/02/11 (from the past 48 hour(s))  CBC     Status: Abnormal   Collection Time   12/02/11  7:54 PM      Component Value Range Comment   WBC 14.3 (*) 4.0 - 10.5 K/uL    RBC 2.67 (*) 4.22 - 5.81 MIL/uL    Hemoglobin 8.7 (*) 13.0 - 17.0 g/dL    HCT 56.2 (*) 13.0 - 52.0 %    MCV 92.5  78.0 - 100.0 fL    MCH 32.6  26.0 - 34.0 pg    MCHC 35.2  30.0 - 36.0 g/dL    RDW 86.5  78.4 - 69.6 %    Platelets 252  150 - 400 K/uL   DIFFERENTIAL     Status: Abnormal  Collection Time   12/02/11  7:54 PM      Component Value Range Comment   Neutrophils Relative 83 (*) 43 - 77 %    Neutro Abs 11.8 (*) 1.7 - 7.7 K/uL    Lymphocytes Relative 9 (*) 12 - 46 %    Lymphs Abs 1.3  0.7 - 4.0 K/uL    Monocytes Relative 9  3 - 12 %    Monocytes Absolute 1.2 (*) 0.1 - 1.0 K/uL    Eosinophils Relative 0  0 - 5 %    Eosinophils Absolute 0.0  0.0 - 0.7 K/uL    Basophils Relative 0  0 - 1 %    Basophils Absolute 0.0  0.0 - 0.1 K/uL   APTT     Status: Abnormal   Collection Time   12/02/11  7:54 PM      Component Value Range Comment   aPTT 72 (*) 24 - 37 seconds   PROTIME-INR     Status: Abnormal   Collection Time   12/02/11  7:54 PM      Component Value Range Comment   Prothrombin Time 36.3 (*) 11.6 - 15.2 seconds    INR 3.58 (*) 0.00 - 1.49   PROCALCITONIN     Status: Normal   Collection Time   12/02/11  7:55 PM      Component Value Range Comment   Procalcitonin 0.19     LACTIC ACID,  PLASMA     Status: Abnormal   Collection Time   12/02/11  8:07 PM      Component Value Range Comment   Lactic Acid, Venous 2.8 (*) 0.5 - 2.2 mmol/L   TYPE AND SCREEN     Status: Normal (Preliminary result)   Collection Time   12/02/11  8:15 PM      Component Value Range Comment   ABO/RH(D) O POS      Antibody Screen NEG      Sample Expiration 12/05/2011      Unit Number [id number]      Blood Component Type RED CELLS,LR      Unit division 00      Status of Unit ALLOCATED      Transfusion Status OK TO TRANSFUSE      Crossmatch Result Compatible      Unit Number [id number]      Blood Component Type RBC LR PHER2      Unit division 00      Status of Unit ALLOCATED      Transfusion Status OK TO TRANSFUSE      Crossmatch Result Compatible     POCT I-STAT, CHEM 8     Status: Abnormal   Collection Time   12/02/11  8:17 PM      Component Value Range Comment   Sodium 138  135 - 145 mEq/L    Potassium 4.3  3.5 - 5.1 mEq/L    Chloride 100  96 - 112 mEq/L    BUN 30 (*) 6 - 23 mg/dL    Creatinine, Ser 7.82  0.50 - 1.35 mg/dL    Glucose, Bld 956 (*) 70 - 99 mg/dL    Calcium, Ion 2.13 (*) 1.12 - 1.32 mmol/L    TCO2 23  0 - 100 mmol/L    Hemoglobin 8.2 (*) 13.0 - 17.0 g/dL    HCT 08.6 (*) 57.8 - 52.0 %   PREPARE FRESH FROZEN PLASMA     Status: Normal (Preliminary result)   Collection Time  12/02/11  8:40 PM      Component Value Range Comment   Unit Number [id number]      Blood Component Type THAWED PLASMA      Unit division 00      Status of Unit ALLOCATED      Transfusion Status OK TO TRANSFUSE      Unit Number [id number]      Blood Component Type THAWED PLASMA      Unit division 00      Status of Unit ISSUED      Transfusion Status OK TO TRANSFUSE      Unit Number [id number]      Blood Component Type THAWED PLASMA      Unit division 00      Status of Unit ISSUED      Transfusion Status OK TO TRANSFUSE      Unit Number [id number]      Blood Component Type THAWED PLASMA      Unit  division 00      Status of Unit ISSUED      Transfusion Status OK TO TRANSFUSE      Unit Number [id number]      Blood Component Type THAWED PLASMA      Unit division 00      Status of Unit ALLOCATED      Transfusion Status OK TO TRANSFUSE      Unit Number [id number]      Blood Component Type THAWED PLASMA      Unit division 00      Status of Unit ALLOCATED      Transfusion Status OK TO TRANSFUSE     PREPARE RBC (CROSSMATCH)     Status: Normal   Collection Time   12/02/11  8:45 PM      Component Value Range Comment   Order Confirmation ORDER PROCESSED BY BLOOD BANK     URINALYSIS, ROUTINE W REFLEX MICROSCOPIC     Status: Abnormal   Collection Time   12/02/11  9:17 PM      Component Value Range Comment   Color, Urine YELLOW  YELLOW    APPearance CLEAR  CLEAR    Specific Gravity, Urine 1.014  1.005 - 1.030    pH 5.0  5.0 - 8.0    Glucose, UA NEGATIVE  NEGATIVE mg/dL    Hgb urine dipstick MODERATE (*) NEGATIVE    Bilirubin Urine NEGATIVE  NEGATIVE    Ketones, ur NEGATIVE  NEGATIVE mg/dL    Protein, ur NEGATIVE  NEGATIVE mg/dL    Urobilinogen, UA 0.2  0.0 - 1.0 mg/dL    Nitrite NEGATIVE  NEGATIVE    Leukocytes, UA TRACE (*) NEGATIVE   URINE MICROSCOPIC-ADD ON     Status: Normal   Collection Time   12/02/11  9:17 PM      Component Value Range Comment   Squamous Epithelial / LPF RARE  RARE    WBC, UA 0-2  <3 WBC/hpf    RBC / HPF 0-2  <3 RBC/hpf     Dg Chest Port 1 View  12/02/2011  *RADIOLOGY REPORT*  Clinical Data: 78 year old male preoperative study for hip surgery.  PORTABLE CHEST - 1 VIEW  Comparison: 10/30/2011 and earlier.  Findings: Portable semi upright AP view 2036 hours.  Improved lung volumes and interval resolved patchy right lung opacity.  Left PICC line removed.  Sequelae of CABG.  Cardiac size and mediastinal contours are within normal limits.  No pneumothorax edema or effusion.  IMPRESSION:  No acute cardiopulmonary abnormality.  Original Report Authenticated By: Harley Hallmark, M.D.    Review of Systems  Constitutional: Negative.   HENT: Negative.   Eyes: Negative.   Respiratory: Negative.   Cardiovascular: Negative.   Gastrointestinal: Negative.   Genitourinary: Negative.   Musculoskeletal: Negative.        Right foot pain and discoloration.  Skin: Negative.   Neurological: Negative.   Endo/Heme/Allergies: Negative.   Psychiatric/Behavioral: Negative.    Blood pressure 109/37, pulse 70, temperature 98.5 F (36.9 C), temperature source Oral, resp. rate 16, height 5\' 8"  (1.727 m), weight 61.2 kg (134 lb 14.7 oz), SpO2 100.00%. Physical Exam  Constitutional: He is oriented to person, place, and time. He appears well-developed. No distress.  HENT:  Head: Normocephalic and atraumatic.  Right Ear: External ear normal.  Left Ear: External ear normal.  Nose: Nose normal.  Mouth/Throat: Oropharynx is clear and moist. No oropharyngeal exudate.  Eyes: Conjunctivae are normal. Pupils are equal, round, and reactive to light. Right eye exhibits no discharge. Left eye exhibits no discharge. No scleral icterus.  Neck: Normal range of motion. Neck supple.  Cardiovascular: Normal rate and regular rhythm.   Respiratory: Effort normal and breath sounds normal. He has no wheezes. He has no rales.  GI: Soft. Bowel sounds are normal. He exhibits no distension. There is no tenderness. There is no rebound.       Supra pubic catheter.  Musculoskeletal:       Right foot is erythematous and cold. Left foot looks pale.  Neurological: He is alert and oriented to person, place, and time.  Skin: He is not diaphoretic.       Stage 2 sacral decubitus ulcer.  Psychiatric: His behavior is normal.    Assessment/Plan: #1. Ischemic right foot - management per Dr.Fields, vascular surgeon on call. His Coumadin is being discontinued and the INR reversed in anticipation of possible surgery. Vancomycin has been empirically started. #2. Coagulopathy secondary to Coumadin - INR is  being reversed for possible surgery. FFP has been ordered ordered. #3. Atrial fibrillation presently rate controlled. #4. Hypertension - patient is n.p.o. so patient is placed on IV antihypertensives for now which can be changed to oral once patient starts taking orally. #5. Diarrhea - patient's wife states that he has been having some loose stools and has been on multiple antibiotic regimen for his UTI. Check stool for C. Difficile. #6. Diabetes mellitus2 - presently will be on sliding scale coverage. #7. CAD status post CABG - presently chest pain-free. #8. Chronic anemia - follow CBC closely. I have ordered type and screen for if needed transfusion. #9. Stage II sacral decubitus ulcers. #10. Chronic indwelling Foley catheter with history of recurrent UTI - check urinalysis and cultures.  CODE STATUS - full code. I have discussed with [name] and Dr.Fields at this time want [place] to assume patient's care. [name] will be managing the ischemic foot.   12/02/2011, 11:49 PM

## 2011-12-02 NOTE — ED Provider Notes (Signed)
History     CSN: [id number]  Arrival date & time 12/02/11  [name]   First MD Initiated Contact with Patient 12/02/11 1938      Chief Complaint  Patient presents with  . Cold Extremity    (Consider location/radiation/quality/duration/timing/severity/associated sxs/prior treatment) HPI Pt with numerous medical problems who has had "decreased pulse" in R foot for 2 weeks but since last night has been very painful and cool to the touch. Today it was also discolored and so he was sent to the ED for eval. He reports moderate aching pain. Worse with palpation and movement.   Past Medical History  Diagnosis Date  . A-fib   . CAD (coronary artery disease)   . Hyperlipidemia   . Diabetes mellitus   . Hypertension   . Suprapubic catheter   . Peripheral vascular disease   . Arthritis   . Cancer     prostate cancer    Past Surgical History  Procedure Date  . Prostatectomy     radiation therapy  . Bladder cath   . Laminectomy     lumbar  . Lumbar disc surgery   . Right colectomy   . Incisional hernia repair   . Oronary artery bypass   . Total hip arthroplasty     RT  . Cataract extraction 2012    Left 03/2011, Right 04/2011    Family History  Problem Relation Age of Onset  . Coronary artery disease      siblings  . Heart attack Father   . Diabetes Sister   . Hyperlipidemia      family history    History  Substance Use Topics  . Smoking status: Former Smoker    Types: Cigarettes    Quit date: 06/10/1970  . Smokeless tobacco: Not on file  . Alcohol Use: No      Review of Systems All other systems reviewed and are negative except as noted in HPI.    Allergies  Levaquin; Codeine; and Penicillins  Home Medications   Current Outpatient Rx  Name Route Sig Dispense Refill  . ALENDRONATE SODIUM 70 MG PO TABS Oral Take 70 mg by mouth every 7 (seven) days. Take with a full glass of water on an empty stomach.    . ATORVASTATIN CALCIUM 80 MG PO TABS Oral Take 80  mg by mouth daily.    Marland Kitchen CALCIUM CARBONATE 1250 MG PO TABS Oral Take 1 tablet by mouth daily.    Marland Kitchen VITAMIN D 1000 UNITS PO TABS Oral Take 2,000 Units by mouth daily.    Marland Kitchen DIGOXIN 0.125 MG PO TABS Oral Take 125 mcg by mouth daily. Hold if HR <60    . OMEGA-3 FATTY ACIDS 1000 MG PO CAPS Oral Take 1 g by mouth daily.      . FUROSEMIDE 20 MG PO TABS Oral Take 20 mg by mouth 2 (two) times daily.    . INSULIN ASPART 100 UNIT/ML [place] SOLN Subcutaneous Inject 5 Units into the skin 3 (three) times daily with meals. If CBG> 150    . METFORMIN HCL 500 MG PO TABS Oral Take 500 mg by mouth 2 (two) times daily.    Marland Kitchen METOPROLOL SUCCINATE ER 50 MG PO TB24 Oral Take 50 mg by mouth 2 (two) times daily. Take with or immediately following a meal.    . MIRTAZAPINE 7.5 MG PO TABS Oral Take 7.5 mg by mouth at bedtime.    . ADULT MULTIVITAMIN W/MINERALS CH Oral Take 1 tablet  by mouth daily.    Marland Kitchen OMEPRAZOLE MAGNESIUM 20 MG PO TBEC Oral Take 20 mg by mouth daily.      . OXYCODONE-ACETAMINOPHEN 5-325 MG PO TABS Oral Take 1 tablet by mouth every 6 (six) hours as needed. For pain.    Marland Kitchen RAMIPRIL 10 MG PO TABS Oral Take 10 mg by mouth daily.    Marland Kitchen SACCHAROMYCES BOULARDII 250 MG PO CAPS Oral Take 250 mg by mouth 2 (two) times daily. X 2 weeks; end on 12/06/11    . VITAMIN C 500 MG PO TABS Oral Take 500 mg by mouth 2 (two) times daily.    . WARFARIN SODIUM 3 MG PO TABS Oral Take 1 mg by mouth daily.       BP 119/44  Pulse 76  Temp 98.2 F (36.8 C) (Oral)  Resp 20  SpO2 100%  Physical Exam  Nursing note and vitals reviewed. Constitutional: He is oriented to person, place, and time. He appears well-developed and well-nourished.  HENT:  Head: Normocephalic and atraumatic.  Eyes: EOM are normal. Pupils are equal, round, and reactive to light.  Neck: Normal range of motion. Neck supple.  Cardiovascular: Normal rate and normal heart sounds.   Pulmonary/Chest: Effort normal and breath sounds normal.  Abdominal: Bowel sounds  are normal. He exhibits no distension. There is no tenderness.  Musculoskeletal: Normal range of motion. He exhibits edema and tenderness.       Cold, swollen, discolored and pulseless R foot  Neurological: He is alert and oriented to person, place, and time. He has normal strength. No cranial nerve deficit or sensory deficit.  Skin: Skin is warm and dry. No rash noted.  Psychiatric: He has a normal mood and affect.    ED Course  Procedures (including critical care time)  Labs Reviewed  CBC - Abnormal; Notable for the following:    WBC 14.3 (*)     RBC 2.67 (*)     Hemoglobin 8.7 (*)     HCT 24.7 (*)     All other components within normal limits  DIFFERENTIAL - Abnormal; Notable for the following:    Neutrophils Relative 83 (*)     Neutro Abs 11.8 (*)     Lymphocytes Relative 9 (*)     Monocytes Absolute 1.2 (*)     All other components within normal limits  LACTIC ACID, PLASMA - Abnormal; Notable for the following:    Lactic Acid, Venous 2.8 (*)     All other components within normal limits  APTT - Abnormal; Notable for the following:    aPTT 72 (*)     All other components within normal limits  PROTIME-INR - Abnormal; Notable for the following:    Prothrombin Time 36.3 (*)     INR 3.58 (*)     All other components within normal limits  POCT I-STAT, CHEM 8 - Abnormal; Notable for the following:    BUN 30 (*)     Glucose, Bld 176 (*)     Calcium, Ion 1.03 (*)     Hemoglobin 8.2 (*)     HCT 24.0 (*)     All other components within normal limits  PREPARE FRESH FROZEN PLASMA  PROCALCITONIN  URINALYSIS, ROUTINE W REFLEX MICROSCOPIC  URINE CULTURE  TYPE AND SCREEN  PREPARE RBC (CROSSMATCH)   No results found.   No diagnosis found.    MDM  Discussed with [name] on call for Vascular surgery who has come to the ED  to evaluate the patient. He will admit for reversal of coumadin and likely amputation tomorrow.    Date: 12/02/2011  Rate: 78  Rhythm: atrial  fibrillation  QRS Axis: normal  Intervals: normal  ST/T Wave abnormalities: normal  Conduction Disutrbances:left anterior fascicular block  Narrative Interpretation:   Old EKG Reviewed: unchanged          Leisl Spurrier B. Bernette Mayers, MD 12/02/11 2318

## 2011-12-02 NOTE — ED Notes (Signed)
Pt from [facility] with c/o cold painful right lower extremity since last pm.

## 2011-12-02 NOTE — ED Notes (Signed)
Dr Bernette Mayers at bedside aware of assessment.

## 2011-12-02 NOTE — H&P (Signed)
[facility] [place] HISTORY AND PHYSICAL   History of Present Illness:  Patient is a 78 y.o. year old male who presents for evaluation of ischemia right foot.  He is a debiliated nursing home pt who has a known right hip fracture.  He had been on bedrest because he was too ill to have the fracture repaired.  The patient's wife noticed his foot was cold today.  He has a history of afib and is on coumadin.  INR >3 PTT 72 in ER today.  He complains of pain in the foot.  He has been non ambulatory since May.  His other medical problems include indwelling Foley for prior bladder dysfunction, CAD, hyperlipid, DM, HTN, known PVD, prostate cancer, recent UTI all of these apparently under control. ABI .9 right mainly peroneal, 1.0 left peroneal in January.  Past Medical History  Diagnosis Date  . A-fib   . CAD (coronary artery disease)   . Hyperlipidemia   . Diabetes mellitus   . Hypertension   . Suprapubic catheter   . Peripheral vascular disease   . Arthritis   . Cancer     prostate cancer  4 cm AAA Malnutrition ALB 2.8 Sacral decubitus  Past Surgical History  Procedure Date  . Prostatectomy     radiation therapy  . Bladder cath   . Laminectomy     lumbar  . Lumbar disc surgery   . Right colectomy   . Incisional hernia repair   . Oronary artery bypass   . Total hip arthroplasty     RT  . Cataract extraction 2012    Left 03/2011, Right 04/2011     Social History History  Substance Use Topics  . Smoking status: Former Smoker    Types: Cigarettes    Quit date: 06/10/1970  . Smokeless tobacco: Not on file  . Alcohol Use: No    Family History Family History  Problem Relation Age of Onset  . Coronary artery disease      siblings  . Heart attack Father   . Diabetes Sister   . Hyperlipidemia      family history    Allergies  Allergies  Allergen Reactions  . Levaquin (Levofloxacin) Rash  . Codeine Other (See Comments)    Noted on MAR  .  Penicillins Other (See Comments)    Noted on MAR   No current facility-administered medications on file prior to encounter.   Current Outpatient Prescriptions on File Prior to Encounter  Medication Sig Dispense Refill  . atorvastatin (LIPITOR) 80 MG tablet Take 80 mg by mouth daily.      . calcium carbonate (OS-CAL - DOSED IN MG OF ELEMENTAL CALCIUM) 1250 MG tablet Take 1 tablet by mouth daily.      . digoxin (LANOXIN) 0.125 MG tablet Take 125 mcg by mouth daily. Hold if HR <60      . fish oil-omega-3 fatty acids 1000 MG capsule Take 1 g by mouth daily.        . furosemide (LASIX) 20 MG tablet Take 20 mg by mouth 2 (two) times daily.      . insulin aspart (NOVOLOG) 100 UNIT/ML injection Inject 5 Units into the skin 3 (three) times daily with meals. If CBG> 150      . metFORMIN (GLUCOPHAGE) 500 MG tablet Take 500 mg by mouth 2 (two) times daily.      . metoprolol succinate (TOPROL-XL) 50 MG 24 hr tablet Take 50 mg by mouth  2 (two) times daily. Take with or immediately following a meal.      . mirtazapine (REMERON) 7.5 MG tablet Take 7.5 mg by mouth at bedtime.      Marland Kitchen omeprazole (PRILOSEC OTC) 20 MG tablet Take 20 mg by mouth daily.        Marland Kitchen oxyCODONE-acetaminophen (PERCOCET) 5-325 MG per tablet Take 1 tablet by mouth every 6 (six) hours as needed. For pain.      . ramipril (ALTACE) 10 MG tablet Take 10 mg by mouth daily.      Marland Kitchen warfarin (COUMADIN) 3 MG tablet Take 1 mg by mouth daily.         ROS:   General:  No weight loss, denies  Fever, chills  HEENT: No recent headaches, no nasal bleeding, no visual changes, no sore throat  Neurologic: No dizziness, blackouts, seizures. No recent symptoms of stroke or mini- stroke. No recent episodes of slurred speech, or temporary blindness.  Cardiac: No recent episodes of chest pain/pressure, no shortness of breath at rest.  + shortness of breath with exertion.  + history of atrial fibrillation or irregular heartbeat  Vascular: + history of  non-healing ulcer, No history of DVT   Pulmonary: No home oxygen, no productive cough, no hemoptysis,  No asthma or wheezing  Musculoskeletal:  [x ] Arthritis, [ ]  Low back pain,  [x ] Joint pain  Hematologic:No history of hypercoagulable state.  No history of easy bleeding.  No history of anemia  Gastrointestinal: No hematochezia or melena,  No gastroesophageal reflux, no trouble swallowing  Urinary: [ ]  chronic Kidney disease, [ ]  on HD - [ ]  MWF or [ ]  TTHS, [ ]  Burning with urination, [ ]  Frequent urination, [ ]  Difficulty urinating;   Skin: No rashes  Psychological: No history of anxiety,  No history of depression   Physical Examination  Filed Vitals:   12/02/11 1945 12/02/11 2000 12/02/11 2015 12/02/11 2030  BP: 89/42 98/41 112/40 119/44  Pulse: 76 78 73 76  Temp:      TempSrc:      Resp: 17 18 15 20   SpO2: 96% 100% 100% 100%    There is no height or weight on file to calculate BMI.  General:  Alert and oriented, no acute distress HEENT: Normal Neck: No bruit or JVD Pulmonary: Clear to auscultation bilaterally Cardiac: Regular Rate and Rhythm without murmur Abdomen: Soft, non-tender, non-distended, no mass, midline scar, suprapubic catheter Skin: No rash, right heel decubitus, red/purple discoloration of entire foot with early blisters, left foot pink/warm Extremity Pulses:  2+ radial, brachial, femoral, popliteal pulses bilaterally Musculoskeletal: No obvious deformity or edema  Neurologic: Upper and lower extremity motor 5/5 and symmetric except right foot unable to move toes but flexes ankle  DATA:  CBC    Component Value Date/Time   WBC 14.3* 12/02/2011 1954   RBC 2.67* 12/02/2011 1954   HGB 8.2* 12/02/2011 2017   HCT 24.0* 12/02/2011 2017   PLT 252 12/02/2011 1954   MCV 92.5 12/02/2011 1954   MCH 32.6 12/02/2011 1954   MCHC 35.2 12/02/2011 1954   RDW 14.4 12/02/2011 1954   LYMPHSABS 1.3 12/02/2011 1954   MONOABS 1.2* 12/02/2011 1954   EOSABS 0.0 12/02/2011 1954    BASOSABS 0.0 12/02/2011 1954    BMET    Component Value Date/Time   NA 138 12/02/2011 2017   K 4.3 12/02/2011 2017   CL 100 12/02/2011 2017   CO2 30 11/04/2011 1610  GLUCOSE 176* 12/02/2011 2017   BUN 30* 12/02/2011 2017   CREATININE 1.00 12/02/2011 2017   CREATININE 0.96 04/03/2011 0857   CALCIUM 8.2* 11/04/2011 0625   GFRNONAA 88* 11/04/2011 0625   GFRAA >90 11/04/2011 9562     ASSESSMENT: Ischemic right foot probably non salvageable.  Pt has developed severe ischemia despite supratherapeutic INR which does not bode well for limb salvage.  He is also very debilitated and not really a very good candidate for operation.     PLAN:  Will reverse coumadin with FFP and reasses but he will probably only be a candidate for a right BKA.  Fabienne Bruns, MD [facility] [place] Office: [phone] Pager: [phone]

## 2011-12-03 ENCOUNTER — Inpatient Hospital Stay (HOSPITAL_COMMUNITY): Payer: Medicare Other | Admitting: Anesthesiology

## 2011-12-03 ENCOUNTER — Encounter (HOSPITAL_COMMUNITY): Payer: Self-pay | Admitting: Anesthesiology

## 2011-12-03 ENCOUNTER — Encounter (HOSPITAL_COMMUNITY)
Admission: EM | Disposition: A | Discharge status: Skilled Nursing Facility | Payer: Self-pay | Source: Ambulatory Visit | Attending: Vascular Surgery

## 2011-12-03 DIAGNOSIS — I4891 Unspecified atrial fibrillation: Secondary | ICD-10-CM

## 2011-12-03 DIAGNOSIS — E1165 Type 2 diabetes mellitus with hyperglycemia: Secondary | ICD-10-CM

## 2011-12-03 DIAGNOSIS — I2510 Atherosclerotic heart disease of native coronary artery without angina pectoris: Secondary | ICD-10-CM

## 2011-12-03 DIAGNOSIS — I743 Embolism and thrombosis of arteries of the lower extremities: Secondary | ICD-10-CM

## 2011-12-03 HISTORY — PX: AMPUTATION: SHX166

## 2011-12-03 LAB — COMPREHENSIVE METABOLIC PANEL
Alkaline Phosphatase: 119 U/L — ABNORMAL HIGH (ref 39–117)
BUN: 24 mg/dL — ABNORMAL HIGH (ref 6–23)
Calcium: 7.7 mg/dL — ABNORMAL LOW (ref 8.4–10.5)
Creatinine, Ser: 0.81 mg/dL (ref 0.50–1.35)
GFR calc Af Amer: >90 mL/min (ref >90)
Glucose, Bld: 125 mg/dL — ABNORMAL HIGH (ref 70–99)
Potassium: 3.5 mEq/L (ref 3.5–5.1)
Total Protein: 6.1 g/dL (ref 6.0–8.3)

## 2011-12-03 LAB — CBC
HCT: 19.5 % — ABNORMAL LOW (ref 39.0–52.0)
RBC: 2.09 MIL/uL — ABNORMAL LOW (ref 4.22–5.81)
RDW: 14.5 % (ref 11.5–15.5)
WBC: 10.2 10*3/uL (ref 4.0–10.5)

## 2011-12-03 LAB — APTT: aPTT: 50 seconds — ABNORMAL HIGH (ref 24–37)

## 2011-12-03 LAB — GLUCOSE, CAPILLARY
Glucose-Capillary: 135 mg/dL — ABNORMAL HIGH (ref 70–99)
Glucose-Capillary: 103 mg/dL — ABNORMAL HIGH (ref 70–99)

## 2011-12-03 LAB — PROTIME-INR
INR: 1.63 — ABNORMAL HIGH (ref 0.00–1.49)
INR: 1.9 — ABNORMAL HIGH (ref 0.00–1.49)
Prothrombin Time: 22.1 seconds — ABNORMAL HIGH (ref 11.6–15.2)

## 2011-12-03 LAB — URINE CULTURE
Colony Count: NO GROWTH
Culture  Setup Time: 201306250243
Culture: NO GROWTH

## 2011-12-03 LAB — DIGOXIN LEVEL: Digoxin Level: 1.4 ng/mL (ref 0.8–2.0)

## 2011-12-03 LAB — HEMOGLOBIN AND HEMATOCRIT, BLOOD
HCT: 29.7 % — ABNORMAL LOW (ref 39.0–52.0)
Hemoglobin: 10.1 g/dL — ABNORMAL LOW (ref 13.0–17.0)

## 2011-12-03 LAB — PREPARE RBC (CROSSMATCH)

## 2011-12-03 LAB — CLOSTRIDIUM DIFFICILE BY PCR: Toxigenic C. Difficile by PCR: NEGATIVE

## 2011-12-03 SURGERY — AMPUTATION BELOW KNEE
Anesthesia: Choice | Laterality: Right

## 2011-12-03 MED ORDER — PROMETHAZINE HCL 25 MG/ML IJ SOLN: 6.2500 mg | INTRAMUSCULAR | Status: DC | PRN

## 2011-12-03 MED ORDER — ACETAMINOPHEN 650 MG RE SUPP
650.0000 mg | RECTAL | Status: DC | PRN
  Administered 2011-12-03: 650 mg via RECTAL

## 2011-12-03 MED ORDER — LIDOCAINE HCL (CARDIAC) 20 MG/ML IV SOLN
INTRAVENOUS | Status: DC | PRN
  Administered 2011-12-03: 30 mg via INTRAVENOUS

## 2011-12-03 MED ORDER — ALENDRONATE SODIUM 70 MG PO TABS: 70.0000 mg | ORAL_TABLET | ORAL | Status: DC

## 2011-12-03 MED ORDER — ACETAMINOPHEN 325 MG PO TABS: 325.0000 mg | ORAL_TABLET | ORAL | Status: DC | PRN

## 2011-12-03 MED ORDER — VANCOMYCIN HCL 1000 MG IV SOLR
750.0000 mg | Freq: Two times a day (BID) | INTRAVENOUS | Status: AC
  Administered 2011-12-03 – 2011-12-04 (×2): 750 mg via INTRAVENOUS
  Filled 2011-12-03 (×2): qty 750

## 2011-12-03 MED ORDER — MORPHINE SULFATE 2 MG/ML IJ SOLN
2.0000 mg | INTRAMUSCULAR | Status: DC | PRN
  Administered 2011-12-03: 2 mg via INTRAVENOUS
  Administered 2011-12-04: 4 mg via INTRAVENOUS
  Filled 2011-12-03: qty 2
  Filled 2011-12-03: qty 1

## 2011-12-03 MED ORDER — ACETAMINOPHEN 650 MG RE SUPP: 325.0000 mg | RECTAL | Status: DC | PRN

## 2011-12-03 MED ORDER — OXYCODONE-ACETAMINOPHEN 5-325 MG PO TABS: 1.0000 | ORAL_TABLET | Freq: Four times a day (QID) | ORAL | Status: DC | PRN

## 2011-12-03 MED ORDER — GLYCOPYRROLATE 0.2 MG/ML IJ SOLN
INTRAMUSCULAR | Status: DC | PRN
  Administered 2011-12-03: 0.6 mg via INTRAVENOUS

## 2011-12-03 MED ORDER — ATORVASTATIN CALCIUM 80 MG PO TABS
80.0000 mg | ORAL_TABLET | Freq: Every day | ORAL | Status: DC
  Administered 2011-12-04: 80 mg via ORAL
  Filled 2011-12-03 (×2): qty 1

## 2011-12-03 MED ORDER — RAMIPRIL 10 MG PO TABS: 10.0000 mg | ORAL_TABLET | Freq: Every day | ORAL | Status: DC

## 2011-12-03 MED ORDER — PROPOFOL 10 MG/ML IV EMUL
INTRAVENOUS | Status: DC | PRN
  Administered 2011-12-03: 80 mg via INTRAVENOUS
  Administered 2011-12-03 (×2): 20 mg via INTRAVENOUS

## 2011-12-03 MED ORDER — FUROSEMIDE 20 MG PO TABS
20.0000 mg | ORAL_TABLET | Freq: Two times a day (BID) | ORAL | Status: DC
  Administered 2011-12-04: 20 mg via ORAL
  Filled 2011-12-03 (×3): qty 1

## 2011-12-03 MED ORDER — HYDRALAZINE HCL 20 MG/ML IJ SOLN
10.0000 mg | INTRAMUSCULAR | Status: DC | PRN
  Filled 2011-12-03: qty 0.5

## 2011-12-03 MED ORDER — CHLORHEXIDINE GLUCONATE CLOTH 2 % EX PADS
6.0000 | MEDICATED_PAD | Freq: Every day | CUTANEOUS | Status: DC
  Administered 2011-12-03 – 2011-12-04 (×2): 6 via TOPICAL

## 2011-12-03 MED ORDER — HYDROMORPHONE HCL PF 1 MG/ML IJ SOLN: 0.2500 mg | INTRAMUSCULAR | Status: DC | PRN

## 2011-12-03 MED ORDER — ALUM & MAG HYDROXIDE-SIMETH 200-200-20 MG/5ML PO SUSP: 15.0000 mL | ORAL | Status: DC | PRN

## 2011-12-03 MED ORDER — GUAIFENESIN-DM 100-10 MG/5ML PO SYRP: 15.0000 mL | ORAL_SOLUTION | ORAL | Status: DC | PRN

## 2011-12-03 MED ORDER — ROCURONIUM BROMIDE 100 MG/10ML IV SOLN: INTRAVENOUS | Status: DC | PRN

## 2011-12-03 MED ORDER — LACTATED RINGERS IV SOLN
INTRAVENOUS | Status: DC
Start: 2011-12-03 — End: 2011-12-03
  Administered 2011-12-03 (×2): via INTRAVENOUS

## 2011-12-03 MED ORDER — VECURONIUM BROMIDE 10 MG IV SOLR
INTRAVENOUS | Status: DC | PRN
  Administered 2011-12-03: 5 mg via INTRAVENOUS
  Administered 2011-12-03: 2 mg via INTRAVENOUS

## 2011-12-03 MED ORDER — OMEPRAZOLE MAGNESIUM 20 MG PO TBEC: 20.0000 mg | DELAYED_RELEASE_TABLET | Freq: Every day | ORAL | Status: DC

## 2011-12-03 MED ORDER — VITAMIN C 500 MG PO TABS
500.0000 mg | ORAL_TABLET | Freq: Two times a day (BID) | ORAL | Status: DC
  Administered 2011-12-03 – 2011-12-05 (×4): 500 mg via ORAL
  Filled 2011-12-03 (×5): qty 1

## 2011-12-03 MED ORDER — PHENYLEPHRINE HCL 10 MG/ML IJ SOLN
INTRAMUSCULAR | Status: DC | PRN
  Administered 2011-12-03 (×3): 80 ug via INTRAVENOUS

## 2011-12-03 MED ORDER — ONDANSETRON HCL 4 MG/2ML IJ SOLN
INTRAMUSCULAR | Status: DC | PRN
  Administered 2011-12-03: 4 mg via INTRAVENOUS

## 2011-12-03 MED ORDER — NEOSTIGMINE METHYLSULFATE 1 MG/ML IJ SOLN
INTRAMUSCULAR | Status: DC | PRN
  Administered 2011-12-03: 3 mg via INTRAVENOUS

## 2011-12-03 MED ORDER — TEMAZEPAM 15 MG PO CAPS
15.0000 mg | ORAL_CAPSULE | Freq: Every evening | ORAL | Status: DC | PRN
  Administered 2011-12-03: 15 mg via ORAL
  Filled 2011-12-03: qty 1

## 2011-12-03 MED ORDER — WARFARIN - PHYSICIAN DOSING INPATIENT
Freq: Every day | Status: DC
  Administered 2011-12-04: 18:00:00

## 2011-12-03 MED ORDER — OMEGA-3-ACID ETHYL ESTERS 1 G PO CAPS
1.0000 g | ORAL_CAPSULE | Freq: Every day | ORAL | Status: DC
  Administered 2011-12-04 – 2011-12-05 (×2): 1 g via ORAL
  Filled 2011-12-03 (×2): qty 1

## 2011-12-03 MED ORDER — ADULT MULTIVITAMIN W/MINERALS CH
1.0000 | ORAL_TABLET | Freq: Every day | ORAL | Status: DC
  Administered 2011-12-04 – 2011-12-05 (×2): 1 via ORAL
  Filled 2011-12-03 (×2): qty 1

## 2011-12-03 MED ORDER — POTASSIUM CHLORIDE CRYS ER 20 MEQ PO TBCR: 20.0000 meq | EXTENDED_RELEASE_TABLET | Freq: Once | ORAL | Status: AC | PRN

## 2011-12-03 MED ORDER — PHENOL 1.4 % MT LIQD: 1.0000 | OROMUCOSAL | Status: DC | PRN

## 2011-12-03 MED ORDER — LABETALOL HCL 5 MG/ML IV SOLN
10.0000 mg | INTRAVENOUS | Status: DC | PRN
  Filled 2011-12-03: qty 4

## 2011-12-03 MED ORDER — SACCHAROMYCES BOULARDII 250 MG PO CAPS
250.0000 mg | ORAL_CAPSULE | Freq: Two times a day (BID) | ORAL | Status: DC
  Administered 2011-12-03 – 2011-12-05 (×4): 250 mg via ORAL
  Filled 2011-12-03 (×5): qty 1

## 2011-12-03 MED ORDER — MIRTAZAPINE 7.5 MG PO TABS
7.5000 mg | ORAL_TABLET | Freq: Every day | ORAL | Status: DC
  Administered 2011-12-03 – 2011-12-05 (×2): 7.5 mg via ORAL
  Filled 2011-12-03 (×3): qty 1

## 2011-12-03 MED ORDER — VITAMIN D3 25 MCG (1000 UNIT) PO TABS
2000.0000 [IU] | ORAL_TABLET | Freq: Every day | ORAL | Status: DC
  Administered 2011-12-04 – 2011-12-05 (×2): 2000 [IU] via ORAL
  Filled 2011-12-03 (×2): qty 2

## 2011-12-03 MED ORDER — VECURONIUM BROMIDE 10 MG IV SOLR: INTRAVENOUS | Status: DC | PRN

## 2011-12-03 MED ORDER — CALCIUM CARBONATE 1250 (500 CA) MG PO TABS
1.0000 | ORAL_TABLET | Freq: Every day | ORAL | Status: DC
  Administered 2011-12-04 – 2011-12-05 (×2): 500 mg via ORAL
  Filled 2011-12-03 (×3): qty 1

## 2011-12-03 MED ORDER — ONDANSETRON HCL 4 MG/2ML IJ SOLN: 4.0000 mg | Freq: Four times a day (QID) | INTRAMUSCULAR | Status: DC | PRN

## 2011-12-03 MED ORDER — DOCUSATE SODIUM 100 MG PO CAPS
100.0000 mg | ORAL_CAPSULE | Freq: Every day | ORAL | Status: DC
  Administered 2011-12-04 – 2011-12-05 (×2): 100 mg via ORAL
  Filled 2011-12-03 (×2): qty 1

## 2011-12-03 MED ORDER — WARFARIN SODIUM 1 MG PO TABS
1.0000 mg | ORAL_TABLET | Freq: Every day | ORAL | Status: DC
  Filled 2011-12-03: qty 1

## 2011-12-03 MED ORDER — FENTANYL CITRATE 0.05 MG/ML IJ SOLN
INTRAMUSCULAR | Status: DC | PRN
  Administered 2011-12-03: 50 ug via INTRAVENOUS
  Administered 2011-12-03 (×2): 100 ug via INTRAVENOUS

## 2011-12-03 MED ORDER — RAMIPRIL 10 MG PO CAPS
10.0000 mg | ORAL_CAPSULE | Freq: Every day | ORAL | Status: DC
  Administered 2011-12-04 – 2011-12-05 (×2): 10 mg via ORAL
  Filled 2011-12-03 (×2): qty 1

## 2011-12-03 MED ORDER — METOPROLOL TARTRATE 1 MG/ML IV SOLN: 2.0000 mg | INTRAVENOUS | Status: DC | PRN

## 2011-12-03 MED ORDER — DIGOXIN 125 MCG PO TABS
125.0000 ug | ORAL_TABLET | Freq: Every day | ORAL | Status: DC
  Administered 2011-12-04 – 2011-12-05 (×2): 125 ug via ORAL
  Filled 2011-12-03 (×2): qty 1

## 2011-12-03 MED ORDER — SODIUM CHLORIDE 0.9 % IV SOLN
INTRAVENOUS | Status: DC
  Administered 2011-12-03 – 2011-12-04 (×3): via INTRAVENOUS

## 2011-12-03 MED ORDER — OMEGA-3 FATTY ACIDS 1000 MG PO CAPS: 1.0000 g | ORAL_CAPSULE | Freq: Every day | ORAL | Status: DC

## 2011-12-03 MED ORDER — INSULIN ASPART 100 UNIT/ML ~~LOC~~ SOLN: 5.0000 [IU] | Freq: Three times a day (TID) | SUBCUTANEOUS | Status: DC

## 2011-12-03 MED ORDER — MUPIROCIN 2 % EX OINT
1.0000 "application " | TOPICAL_OINTMENT | Freq: Two times a day (BID) | CUTANEOUS | Status: DC
  Administered 2011-12-03 – 2011-12-04 (×4): 1 via NASAL
  Filled 2011-12-03 (×2): qty 22

## 2011-12-03 MED ORDER — INSULIN ASPART 100 UNIT/ML ~~LOC~~ SOLN
0.0000 [IU] | Freq: Three times a day (TID) | SUBCUTANEOUS | Status: DC
  Administered 2011-12-03 (×2): 1 [IU] via SUBCUTANEOUS

## 2011-12-03 MED ORDER — METFORMIN HCL 500 MG PO TABS
500.0000 mg | ORAL_TABLET | Freq: Two times a day (BID) | ORAL | Status: DC
  Administered 2011-12-04 – 2011-12-05 (×3): 500 mg via ORAL
  Filled 2011-12-03 (×5): qty 1

## 2011-12-03 MED ORDER — PANTOPRAZOLE SODIUM 40 MG PO TBEC
40.0000 mg | DELAYED_RELEASE_TABLET | Freq: Every day | ORAL | Status: DC
  Administered 2011-12-04 – 2011-12-05 (×2): 40 mg via ORAL
  Filled 2011-12-03 (×2): qty 1

## 2011-12-03 MED ORDER — SODIUM CHLORIDE 0.9 % IR SOLN
Status: DC | PRN
  Administered 2011-12-03: 1000 mL

## 2011-12-03 MED ORDER — METOPROLOL SUCCINATE ER 50 MG PO TB24
50.0000 mg | ORAL_TABLET | Freq: Two times a day (BID) | ORAL | Status: DC
  Administered 2011-12-04 – 2011-12-05 (×3): 50 mg via ORAL
  Filled 2011-12-03 (×5): qty 1

## 2011-12-03 MED ORDER — KETOROLAC TROMETHAMINE 30 MG/ML IJ SOLN: 15.0000 mg | Freq: Once | INTRAMUSCULAR | Status: DC | PRN

## 2011-12-03 SURGICAL SUPPLY — 51 items
BANDAGE ELASTIC 6 VELCRO ST LF (GAUZE/BANDAGES/DRESSINGS) ×2 IMPLANT
BANDAGE ESMARK 6X9 LF (GAUZE/BANDAGES/DRESSINGS) IMPLANT
BANDAGE GAUZE 4  KLING STR (GAUZE/BANDAGES/DRESSINGS) ×2 IMPLANT
BANDAGE GAUZE ELAST BULKY 4 IN (GAUZE/BANDAGES/DRESSINGS) ×4 IMPLANT
BLADE SAW RECIP 87.9 MT (BLADE) ×2 IMPLANT
BNDG COHESIVE 1X5 TAN STRL LF (GAUZE/BANDAGES/DRESSINGS) ×2 IMPLANT
BNDG COHESIVE 6X5 TAN STRL LF (GAUZE/BANDAGES/DRESSINGS) ×2 IMPLANT
BNDG ESMARK 6X9 LF (GAUZE/BANDAGES/DRESSINGS)
CANISTER SUCTION 2500CC (MISCELLANEOUS) ×2 IMPLANT
CLIP TI MEDIUM 6 (CLIP) IMPLANT
CLOTH BEACON ORANGE TIMEOUT ST (SAFETY) ×2 IMPLANT
COVER SURGICAL LIGHT HANDLE (MISCELLANEOUS) ×4 IMPLANT
COVER TABLE BACK 60X90 (DRAPES) ×2 IMPLANT
CUFF TOURNIQUET SINGLE 18IN (TOURNIQUET CUFF) IMPLANT
CUFF TOURNIQUET SINGLE 24IN (TOURNIQUET CUFF) IMPLANT
CUFF TOURNIQUET SINGLE 34IN LL (TOURNIQUET CUFF) IMPLANT
CUFF TOURNIQUET SINGLE 44IN (TOURNIQUET CUFF) IMPLANT
DRAIN CHANNEL 19F RND (DRAIN) IMPLANT
DRAPE ORTHO SPLIT 77X108 STRL (DRAPES) ×2
DRAPE PROXIMA HALF (DRAPES) ×4 IMPLANT
DRAPE SURG ORHT 6 SPLT 77X108 (DRAPES) ×2 IMPLANT
DRSG ADAPTIC 3X8 NADH LF (GAUZE/BANDAGES/DRESSINGS) ×2 IMPLANT
ELECT REM PT RETURN 9FT ADLT (ELECTROSURGICAL) ×2
ELECTRODE REM PT RTRN 9FT ADLT (ELECTROSURGICAL) ×1 IMPLANT
EVACUATOR SILICONE 100CC (DRAIN) IMPLANT
GLOVE BIO SURGEON STRL SZ 6.5 (GLOVE) ×4 IMPLANT
GLOVE BIO SURGEON STRL SZ7.5 (GLOVE) ×2 IMPLANT
GOWN PREVENTION PLUS XLARGE (GOWN DISPOSABLE) ×2 IMPLANT
GOWN STRL NON-REIN LRG LVL3 (GOWN DISPOSABLE) ×4 IMPLANT
KIT BASIN OR (CUSTOM PROCEDURE TRAY) ×2 IMPLANT
KIT ROOM TURNOVER OR (KITS) ×2 IMPLANT
NS IRRIG 1000ML POUR BTL (IV SOLUTION) ×2 IMPLANT
PACK GENERAL/GYN (CUSTOM PROCEDURE TRAY) ×2 IMPLANT
PAD ARMBOARD 7.5X6 YLW CONV (MISCELLANEOUS) ×4 IMPLANT
PADDING CAST COTTON 6X4 STRL (CAST SUPPLIES) IMPLANT
SPONGE GAUZE 4X4 12PLY (GAUZE/BANDAGES/DRESSINGS) ×2 IMPLANT
STAPLER VISISTAT 35W (STAPLE) ×2 IMPLANT
STOCKINETTE IMPERVIOUS LG (DRAPES) ×2 IMPLANT
SUT ETHILON 3 0 PS 1 (SUTURE) IMPLANT
SUT SILK 2 0 (SUTURE) ×2
SUT SILK 2 0 SH CR/8 (SUTURE) ×4 IMPLANT
SUT SILK 2-0 18XBRD TIE 12 (SUTURE) ×2 IMPLANT
SUT VIC AB 2-0 CT1 27 (SUTURE) ×2
SUT VIC AB 2-0 CT1 TAPERPNT 27 (SUTURE) ×2 IMPLANT
SUT VIC AB 2-0 SH 18 (SUTURE) ×4 IMPLANT
SUT VIC AB 3-0 SH 27 (SUTURE) ×2
SUT VIC AB 3-0 SH 27X BRD (SUTURE) ×2 IMPLANT
TOWEL OR 17X24 6PK STRL BLUE (TOWEL DISPOSABLE) ×2 IMPLANT
TOWEL OR 17X26 10 PK STRL BLUE (TOWEL DISPOSABLE) ×2 IMPLANT
UNDERPAD 30X30 INCONTINENT (UNDERPADS AND DIAPERS) ×2 IMPLANT
WATER STERILE IRR 1000ML POUR (IV SOLUTION) ×2 IMPLANT

## 2011-12-03 NOTE — Anesthesia Postprocedure Evaluation (Signed)
  Anesthesia Post-op Note  Patient: [name]  Procedure(s) Performed: Procedure(s) (LRB): AMPUTATION BELOW KNEE (Right)  Patient Location: PACU  Anesthesia Type: General  Level of Consciousness: awake and alert   Airway and Oxygen Therapy: Patient Spontanous Breathing and Patient connected to nasal cannula oxygen  Post-op Pain: mild  Post-op Assessment: Post-op Vital signs reviewed and Patient's Cardiovascular Status Stable  Post-op Vital Signs: stable  Complications: No apparent anesthesia complications

## 2011-12-03 NOTE — Progress Notes (Signed)
[facility] [place]  Subjective  - Slept, no complaints of pain  Objective 120/50 69 98.3 F (36.8 C) (Oral) 17 100%  Intake/Output Summary (Last 24 hours) at 12/03/11 0803 Last data filed at 12/03/11 0700  Gross per 24 hour  Intake 7423.67 ml  Output    800 ml  Net 6623.67 ml   Right foot mottled unchanged  Assessment/Planning: Severe anemia after hydration, receiving transfusion Right BKA later today  [name] 12/03/2011 8:03 AM --  Laboratory Lab Results:  Basename 12/03/11 0356 12/02/11 2017 12/02/11 1954  WBC 10.2 -- 14.3*  HGB 6.5* 8.2* --  HCT 19.5* 24.0* --  PLT 202 -- 252   BMET  Basename 12/03/11 0356 12/02/11 2017  NA 140 138  K 3.5 4.3  CL 102 100  CO2 27 --  GLUCOSE 125* 176*  BUN 24* 30*  CREATININE 0.81 1.00  CALCIUM 7.7* --    COAG Lab Results  Component Value Date   INR 1.63* 12/03/2011   INR 3.58* 12/02/2011   INR 2.18* 11/05/2011   No results found for this basename: PTT    Antibiotics Anti-infectives     Start     Dose/Rate Route Frequency Ordered Stop   12/02/11 2300   vancomycin (VANCOCIN) IVPB 1000 mg/200 mL premix        1,000 mg 200 mL/hr over 60 Minutes Intravenous Every 12 hours 12/02/11 2219

## 2011-12-03 NOTE — Anesthesia Preprocedure Evaluation (Signed)
Anesthesia Evaluation  Patient identified by MRN, date of birth, ID band Patient awake    Reviewed: Allergy & Precautions, H&P , NPO status , Patient's Chart, lab work & pertinent test results  History of Anesthesia Complications Negative for: history of anesthetic complications  Airway Mallampati: II  Neck ROM: Full    Dental   Pulmonary  breath sounds clear to auscultation        Cardiovascular hypertension, + CAD and + Past MI + dysrhythmias Rhythm:Regular Rate:Normal     Neuro/Psych PSYCHIATRIC DISORDERS  Neuromuscular disease    GI/Hepatic   Endo/Other  Diabetes mellitus-  Renal/GU CRFRenal disease     Musculoskeletal   Abdominal   Peds  Hematology   Anesthesia Other Findings   Reproductive/Obstetrics                           Anesthesia Physical Anesthesia Plan  ASA: IV  Anesthesia Plan: General   Post-op Pain Management:    Induction: Intravenous  Airway Management Planned: Oral ETT  Additional Equipment:   Intra-op Plan:   Post-operative Plan: Extubation in OR  Informed Consent: I have reviewed the patients History and Physical, chart, labs and discussed the procedure including the risks, benefits and alternatives for the proposed anesthesia with the patient or authorized representative who has indicated his/her understanding and acceptance.     Plan Discussed with: CRNA and Surgeon  Anesthesia Plan Comments:         Anesthesia Quick Evaluation

## 2011-12-03 NOTE — Progress Notes (Signed)
Antibiotic adjustment for renal function by pharmacy  Vancomycin dose has been adjusted to 750 mg IV q12h x 2 doses in this 61 kg pt with a CrCl of 66 ml/min following his below-knee amputation.  Cardell Peach, Pharm D

## 2011-12-03 NOTE — Op Note (Signed)
Procedure: Right below-knee amputation  Preoperative diagnosis: Gangrene right foot  Postoperative diagnosis: Same  Anesthesia Gen.  Assistant: Samantha Rhyne PA-C  Upper findings: #1 calcified vessels with reasonably well perfused muscle tissue  Operative details: After obtaining informed consent, the patient was taken to the operating room. The patient was placed in supine position on the operating table. After induction of general anesthesia and endotracheal intubation, the patient's entire right lower extremity was prepped and draped all the way down to the level of the ankle. Next a transverse incision was made approximately 4 fingerbreadths below the tibial tuberosity on the right leg.  The  incision was carried posteriorly to the midportion of the leg and then extended longitudinally to create a posterior flap. The subcutaneous tissues and fascia was taken down with cautery. Periosteum was raised on the tibia approximately 5 cm above the skin edge.  The periosteum was also raised on the fibula several centimeters above this. The tibia was then divided with a saw. The fibula was divided with a bone cutter. The leg was then elevated in the operative field and the amputation was completed posterior to the bones with an amputation knife. Hemostasis was then obtained with cautery and several suture ligatures. The tibial and sural nerves were pulled down into the field transected and allowed to retract up into the leg.  After hemostasis was obtained, the wound was thoroughly irrigated with  normal saline solution. The fascial edges were then reapproximated with interrupted 2-0 Vicryl sutures. Subcutaneous tissues were reapproximated using running 3-0 Vicryl suture. The skin was closed with staples. The patient tolerated the procedure well and there were no complications. Instrument sponge and  needle counts were correct at the end of the case. The patient was taken to the recovery room in stable  condition.  Briget Shaheed, MD [facility] [place] Office: [phone] Pager: [phone] 

## 2011-12-03 NOTE — Progress Notes (Signed)
INITIAL ADULT NUTRITION ASSESSMENT Date: 12/03/2011   Time: 3:31 PM  Reason for Assessment: Nutrition Risk report--unintentional weight loss  ASSESSMENT: Male 78 y.o.  Dx: Ischemic Foot; scheduled for right BKA today  Hx:  Past Medical History  Diagnosis Date  . A-fib   . CAD (coronary artery disease)   . Hyperlipidemia   . Diabetes mellitus   . Hypertension   . Suprapubic catheter   . Peripheral vascular disease   . Arthritis   . Cancer     prostate cancer   Past Surgical History  Procedure Date  . Prostatectomy     radiation therapy  . Bladder cath   . Laminectomy     lumbar  . Lumbar disc surgery   . Right colectomy   . Incisional hernia repair   . Oronary artery bypass   . Total hip arthroplasty     RT  . Cataract extraction 2012    Left 03/2011, Right 04/2011    Related Meds:  Scheduled Meds:   . Chlorhexidine Gluconate Cloth  6 each Topical Q0600  . insulin aspart  0-9 Units Subcutaneous TID WC  . mupirocin ointment  1 application Nasal BID  . sodium chloride  1,000 mL Intravenous Once  . vancomycin  1,000 mg Intravenous Q12H   Continuous Infusions:   . sodium chloride Stopped (12/03/11 0700)  . sodium chloride 100 mL/hr at 12/02/11 2317   PRN Meds:.acetaminophen, guaiFENesin-dextromethorphan, hydrALAZINE, labetalol, metoprolol, morphine injection, ondansetron, phenol   Ht: 5\' 8"  (172.7 cm)  Wt: 134 lb 14.7 oz (61.2 kg) 6/24--prior to amputation  Ideal Wt: 70 kg (prior to BKA); 66 kg (post BKA) % Ideal Wt: 87%  Wt Readings from Last 14 Encounters:  12/02/11 134 lb 14.7 oz (61.2 kg)  12/02/11 134 lb 14.7 oz (61.2 kg)  11/04/11 163 lb 14.4 oz (74.345 kg)  09/16/11 152 lb (68.947 kg)  05/20/11 154 lb (69.854 kg)  04/16/11 156 lb (70.761 kg)  04/03/11 156 lb (70.761 kg)  03/15/11 162 lb (73.483 kg)  12/31/10 165 lb (74.844 kg)  10/22/10 163 lb (73.936 kg)  07/31/10 155 lb (70.308 kg)  06/14/10 162 lb (73.483 kg)  04/03/10 162 lb 12 oz  (73.823 kg)  08/08/09 170 lb (77.111 kg)    Usual Wt: 163 lb (1 month ago) % Usual Wt: 82%  Body mass index is 20.51 kg/(m^2).  Food/Nutrition Related Hx: poor appetite and poor intake over the past month; was not eating well at nursing facility per wife; was receiving Ensure, protein supplement, and Magic Cups at nursing facility.  Labs:  CMP     Component Value Date/Time   NA 140 12/03/2011 0356   K 3.5 12/03/2011 0356   CL 102 12/03/2011 0356   CO2 27 12/03/2011 0356   GLUCOSE 125* 12/03/2011 0356   BUN 24* 12/03/2011 0356   CREATININE 0.81 12/03/2011 0356   CREATININE 0.96 04/03/2011 0857   CALCIUM 7.7* 12/03/2011 0356   PROT 6.1 12/03/2011 0356   ALBUMIN 2.1* 12/03/2011 0356   AST 35 12/03/2011 0356   ALT 25 12/03/2011 0356   ALKPHOS 119* 12/03/2011 0356   BILITOT 0.3 12/03/2011 0356   GFRNONAA 84* 12/03/2011 0356   GFRAA >90 12/03/2011 0356   Potassium  Date/Time Value Range Status  12/03/2011  3:56 AM 3.5  3.5 - 5.1 mEq/L Final  12/02/2011  8:17 PM 4.3  3.5 - 5.1 mEq/L Final  11/04/2011  6:25 AM 3.1* 3.5 - 5.1 mEq/L Final  CBG (last 3)   Basename 12/03/11 1210 12/03/11 0753  GLUCAP 124* 135*     Intake/Output Summary (Last 24 hours) at 12/03/11 1536 Last data filed at 12/03/11 1315  Gross per 24 hour  Intake 8923.67 ml  Output    800 ml  Net 8123.67 ml     Diet Order: NPO   IVF:    sodium chloride Last Rate: Stopped (12/03/11 0700)  sodium chloride Last Rate: 100 mL/hr at 12/02/11 2317    Estimated Nutritional Needs:   Kcal: 1800-2000 Protein: 90-110 grams Fluid: 1.8-2 liters  Patient with severe malnutrition in the context of chronic illness, given 18% weight loss over the past month, intake < 75% of estimated energy requirement for > 1 month, and severe temporal, clavicle, and orbital wasting.    Patient is NPO today; awaiting surgery (right BKA) this afternoon.  Will need increased protein and calorie intake post-op to promote healing.  Discussed ways  to increase protein and calorie needs post-op with patient and family.  Patient is agreeable to supplements once diet is advanced.  NUTRITION DIAGNOSIS: -Malnutrition (NI-5.2).  Status: Ongoing  RELATED TO: suboptimal oral intake  AS EVIDENCE BY: 18% weight loss in one month; visible temporal, orbital, and clavicle wasting  MONITORING/EVALUATION(Goals): Goal:  Intake to meet 90-100% of estimated nutrition needs to promote healing post-operatively. Monitor:  Diet advancement, PO intake, labs, weight trend.  EDUCATION NEEDS: -Education needs addressed  INTERVENTION:  Recommend advance diet as tolerated to regular without any restrictions.  Once diet advanced, will need PO supplements to help meet nutrition needs.   DOCUMENTATION CODES Per approved criteria  -Severe malnutrition in the context of chronic illness    Joaquin Courts, RD, CNSC, LDN Pager# [phone] After Hours Pager# [phone] 12/03/2011, 3:31 PM

## 2011-12-03 NOTE — Transfer of Care (Signed)
Immediate Anesthesia Transfer of Care Note  Patient: [name]  Procedure(s) Performed: Procedure(s) (LRB): AMPUTATION BELOW KNEE (Right)  Patient Location: PACU  Anesthesia Type: General  Level of Consciousness: sedated  Airway & Oxygen Therapy: Patient Spontanous Breathing and Patient connected to nasal cannula oxygen  Post-op Assessment: Report given to PACU RN and Post -op Vital signs reviewed and stable  Post vital signs: stable  Complications: No apparent anesthesia complications

## 2011-12-03 NOTE — Consult Note (Signed)
[facility]  Interim history: 78 year old male with known history of a fibrillation on Coumadin, CAD status post CABG, hypertension, diabetes mellitus2, chronic indwelling catheter status post treatment for prostate cancer who lives in a nursing home was found to have increasing foot pain since last night. Patient is complaining of pain in the right foot which gradually worsened from last night and today and patient's wife removed his socks from his right foot it was found to be cold and erythematous.    Subjective: No pain in foot currently.   Objective: Blood pressure 123/69, pulse 71, temperature 100.1 F (37.8 C), temperature source Oral, resp. rate 18, height 5\' 8"  (1.727 m), weight 61.2 kg (134 lb 14.7 oz), SpO2 96.00%. Weight change:   Intake/Output Summary (Last 24 hours) at 12/03/11 1654 Last data filed at 12/03/11 1600  Gross per 24 hour  Intake 9436.17 ml  Output   1150 ml  Net 8286.17 ml    Physical Exam: General appearance: alert, cooperative and no distress Throat: lips, mucosa, and tongue normal; teeth and gums normal Lungs: clear to auscultation bilaterally Heart: IRR Abdomen: soft, non-tender; bowel sounds normal; no masses,  no organomegaly Extremities: left foot and ankle area is purple/ black and painful to touch.   Lab Results:  [facility] 12/03/11 0356 12/02/11 2017  NA 140 138  K 3.5 4.3  CL 102 100  CO2 27 --  GLUCOSE 125* 176*  BUN 24* 30*  CREATININE 0.81 1.00  CALCIUM 7.7* --  MG -- --  PHOS -- --    Basename 12/03/11 0356  AST 35  ALT 25  ALKPHOS 119*  BILITOT 0.3  PROT 6.1  ALBUMIN 2.1*   No results found for this basename: LIPASE:2,AMYLASE:2 in the last 72 hours  Basename 12/03/11 1546 12/03/11 0356 12/02/11 1954  WBC -- 10.2 14.3*  NEUTROABS -- -- 11.8*  HGB 10.1* 6.5* --  HCT 29.7* 19.5* --  MCV -- 93.3 92.5  PLT -- 202 252   No results found for this basename: CKTOTAL:3,CKMB:3,CKMBINDEX:3,TROPONINI:3 in the last 72  hours No components found with this basename: POCBNP:3 No results found for this basename: DDIMER:2 in the last 72 hours No results found for this basename: HGBA1C:2 in the last 72 hours No results found for this basename: CHOL:2,HDL:2,LDLCALC:2,TRIG:2,CHOLHDL:2,LDLDIRECT:2 in the last 72 hours No results found for this basename: TSH,T4TOTAL,FREET3,T3FREE,THYROIDAB in the last 72 hours No results found for this basename: VITAMINB12:2,FOLATE:2,FERRITIN:2,TIBC:2,IRON:2,RETICCTPCT:2 in the last 72 hours  Micro Results: Recent Results (from the past 240 hour(s))  MRSA PCR SCREENING     Status: Abnormal   Collection Time   12/02/11 10:45 PM      Component Value Range Status Comment   MRSA by PCR POSITIVE (*) NEGATIVE Final   CLOSTRIDIUM DIFFICILE BY PCR     Status: Normal   Collection Time   12/02/11 10:58 PM      Component Value Range Status Comment   C difficile by pcr NEGATIVE  NEGATIVE Final     Studies/Results: Dg Chest Port 1 View  12/02/2011  *RADIOLOGY REPORT*  Clinical Data: 78 year old male preoperative study for hip surgery.  PORTABLE CHEST - 1 VIEW  Comparison: 10/30/2011 and earlier.  Findings: Portable semi upright AP view 2036 hours.  Improved lung volumes and interval resolved patchy right lung opacity.  Left PICC line removed.  Sequelae of CABG.  Cardiac size and mediastinal contours are within normal limits.  No pneumothorax edema or effusion.  IMPRESSION: No acute cardiopulmonary abnormality.  Original  Report Authenticated By: Harley Hallmark, M.D.    Medications: Scheduled Meds:   . Chlorhexidine Gluconate Cloth  6 each Topical Q0600  . insulin aspart  0-9 Units Subcutaneous TID WC  . mupirocin ointment  1 application Nasal BID  . sodium chloride  1,000 mL Intravenous Once  . vancomycin  1,000 mg Intravenous Q12H   Continuous Infusions:   . sodium chloride 125 mL/hr at 12/03/11 1354  . sodium chloride Stopped (12/03/11 0000)   PRN Meds:.acetaminophen,  guaiFENesin-dextromethorphan, hydrALAZINE, labetalol, metoprolol, morphine injection, ondansetron, phenol  Assessment/Plan: Principal Problem:  *Ischemic left  Foot Per vascular surgery- to go for BKA today.    CAD, ARTERY BYPASS GRAFT   Atrial fibrillation Rate controlled.  Coumadin reversed, follow for input on when to resume   Coagulopathy As above- reversed with FFP only- repeat INR later today for need for further FFP or Vit K.    Anemia Receiving 3 U PRBC today. Follow closely, will need anemia panel and stool occults as post surgical orders or we can order tomorrow.    Stage 2 sacral Decubitus  Suprapubic catheter  Disposition: will continue to follow with Vascular surgery- He is currently in the OR  [facility] [phone] 12/03/2011, 4:54 PM  LOS: 1 day

## 2011-12-03 NOTE — Progress Notes (Signed)
Currently on starting 5th unit of FFP. Pt comfortable.  Foot is essentially infarcted and not salvageable.  Will continue FFP for total of 6 units. Will recheck INR in am.  Will do right BKA later today.  Family at bedside updated.  Fabienne Bruns, MD [facility] [place] Office: [phone] Pager: [phone]

## 2011-12-03 NOTE — Clinical Social Work Psychosocial (Signed)
     Clinical Social Work Department BRIEF PSYCHOSOCIAL ASSESSMENT 12/03/2011  Patient:  [name], [name]     Account Number:  [id number]     Admit date:  12/02/2011  Clinical Social Worker:  Dennison Bulla  Date/Time:  12/03/2011 02:00 PM  Referred by:  Physician  Date Referred:  12/03/2011 Referred for  SNF Placement   Other Referral:   Interview type:  Patient Other interview type:   Wife, dtr, grandchildren, sister present    PSYCHOSOCIAL DATA Living Status:  FACILITY Admitted from facility:  [facility] Level of care:  Skilled Nursing Facility Primary support name:  [name] Primary support relationship to patient:  SPOUSE Degree of support available:   Strong    CURRENT CONCERNS Current Concerns  Post-Acute Placement   Other Concerns:    SOCIAL WORK ASSESSMENT / PLAN CSW received referral due to patient being admitted from SNF. CSW reviewed chart and patient was discussed during progression meeting. Per meeting, family was not happy with SNF and did not wish to return.    CSW met with patient and family at bedside. Patient was awake but quiet during assessment. Patient reported he did not feel well and was trying to relax. Patient's family reported that patient was staying at [facility] before admission but they would not let patient return there. Patient has also stayed at [facility] but family is not interested in patient returning to that facility either. Per family, they are still deciding between SNF and HH. Dtr reports that family is discussing if they can handle patient at home with 24 hour care. Family reports that they have several family members that are willing to assist patient instead of him returning to a SNF. CSW provided family with a list of [facility] and [facility]. Family agreed to discuss options and agreed that CSW could follow up to determine options. Family is also interested in [facility] options  that patient's insurance would cover. CSW will continue to follow.   Assessment/plan status:  Psychosocial Support/Ongoing Assessment of Needs Other assessment/ plan:   Information/referral to community resources:   SNF list    PATIENTS/FAMILYS RESPONSE TO PLAN OF CARE: Patient was drowsy during assessment. Family was receptive to speaking with CSW. Family is still deciding between SNF vs HH. Family agreeable to CSW follow up and has CSW contact information.

## 2011-12-04 DIAGNOSIS — E1165 Type 2 diabetes mellitus with hyperglycemia: Secondary | ICD-10-CM

## 2011-12-04 DIAGNOSIS — M79609 Pain in unspecified limb: Secondary | ICD-10-CM

## 2011-12-04 DIAGNOSIS — I4891 Unspecified atrial fibrillation: Secondary | ICD-10-CM

## 2011-12-04 DIAGNOSIS — I743 Embolism and thrombosis of arteries of the lower extremities: Secondary | ICD-10-CM

## 2011-12-04 DIAGNOSIS — I2510 Atherosclerotic heart disease of native coronary artery without angina pectoris: Secondary | ICD-10-CM

## 2011-12-04 LAB — PREPARE FRESH FROZEN PLASMA
Unit division: 0
Unit division: 0
Unit division: 0

## 2011-12-04 LAB — BASIC METABOLIC PANEL
BUN: 22 mg/dL (ref 6–23)
Chloride: 104 mEq/L (ref 96–112)
Creatinine, Ser: 0.91 mg/dL (ref 0.50–1.35)
GFR calc non Af Amer: 80 mL/min — ABNORMAL LOW (ref >90)
Glucose, Bld: 94 mg/dL (ref 70–99)
Potassium: 3.8 mEq/L (ref 3.5–5.1)

## 2011-12-04 LAB — GLUCOSE, CAPILLARY: Glucose-Capillary: 74 mg/dL (ref 70–99)

## 2011-12-04 LAB — CBC
HCT: 30.5 % — ABNORMAL LOW (ref 39.0–52.0)
Hemoglobin: 10.3 g/dL — ABNORMAL LOW (ref 13.0–17.0)
MCH: 30.2 pg (ref 26.0–34.0)
MCHC: 33.8 g/dL (ref 30.0–36.0)
RDW: 16.5 % — ABNORMAL HIGH (ref 11.5–15.5)

## 2011-12-04 LAB — PROTIME-INR: Prothrombin Time: 22.8 seconds — ABNORMAL HIGH (ref 11.6–15.2)

## 2011-12-04 MED ORDER — WARFARIN SODIUM 2 MG PO TABS
2.0000 mg | ORAL_TABLET | Freq: Once | ORAL | Status: AC
  Administered 2011-12-04: 2 mg via ORAL
  Filled 2011-12-04: qty 1

## 2011-12-04 MED ORDER — ENOXAPARIN SODIUM 30 MG/0.3ML ~~LOC~~ SOLN
30.0000 mg | SUBCUTANEOUS | Status: DC
  Administered 2011-12-04: 30 mg via SUBCUTANEOUS
  Filled 2011-12-04: qty 0.3

## 2011-12-04 MED ORDER — ENOXAPARIN SODIUM 60 MG/0.6ML ~~LOC~~ SOLN
60.0000 mg | Freq: Two times a day (BID) | SUBCUTANEOUS | Status: DC
  Administered 2011-12-05: 60 mg via SUBCUTANEOUS
  Filled 2011-12-04 (×4): qty 0.6

## 2011-12-04 NOTE — Progress Notes (Signed)
ANTICOAGULATION CONSULT NOTE - Follow Up Consult  Pharmacy Consult for Lovenox until INR >2 Indication: atrial fibrillation  Patient Measurements: Height: 5\' 8"  (172.7 cm) Weight: 134 lb 14.7 oz (61.2 kg) IBW/kg (Calculated) : 68.4   Labs:  Basename 12/04/11 1015 12/04/11 0408 12/03/11 2149 12/03/11 1546 12/03/11 0356 12/02/11 2017 12/02/11 1954  HGB -- 10.3* 10.0* -- -- -- --  HCT -- 30.5* 29.7* 29.7* -- -- --  PLT -- 213 -- -- 202 -- 252  APTT -- -- -- -- 50* -- 72*  LABPROT 22.8* -- 22.1* -- 19.6* -- --  INR 1.97* -- 1.90* -- 1.63* -- --  HEPARINUNFRC -- -- -- -- -- -- --  CREATININE -- 0.91 -- -- 0.81 1.00 --  CKTOTAL -- -- -- -- -- -- --  CKMB -- -- -- -- -- -- --  TROPONINI -- -- -- -- -- -- --    Estimated Creatinine Clearance: 58.8 ml/min (by C-G formula based on Cr of 0.91).  Assessment:   INR just below target range.  Rx also assisting with Coumadin (see note from earlier today).     Lovenox 30 mg SQ given this am;  Now to increase to full-dose Lovenox until INR >2.  Goal of Therapy:  INR 2-3 Monitor platelets by anticoagulation protocol: Yes   Plan:    Lovenox 60 mg SQ q12hrs to begin tonight.   Coumadin 2 mg tonight as previously ordered.   Continue daily PT/INR.   Discontinue Lovenox when INR >2  Dennie Fetters, RPh Pager: [phone] 12/04/2011,5:28 PM

## 2011-12-04 NOTE — Evaluation (Addendum)
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  Pt will benefit from acute PT services to improve overall mobility.      PT Assessment  Patient needs continued PT services    Follow Up Recommendations  Skilled nursing facility    Barriers to Discharge        Equipment Recommendations  Defer to next venue    Recommendations for Other Services     Frequency Min 3X/week    Precautions / Restrictions Precautions Precautions: Fall   Pertinent Vitals/Pain Facial grimace with mobility but does not rate       Mobility  Bed Mobility Bed Mobility: Supine to Sit;Sitting - Scoot to Edge of Bed Supine to Sit: 1: +2 Total assist;HOB elevated Supine to Sit: Patient Percentage: 10% Sitting - Scoot to Edge of Bed: 1: +2 Total assist Sitting - Scoot to Edge of Bed: Patient Percentage: 10% Details for Bed Mobility Assistance: required use of pad and support for trunk rotation Transfers Transfers: Sit to Stand;Stand to Sit;Squat Pivot Transfers Sit to Stand: From bed;Other (comment) (total +3 Pt 10%) Stand to Sit: Other (comment);To chair/3-in-1;With upper extremity assist (total +3 Pt 10%) Squat Pivot Transfers:  (total (A) +3) Details for Transfer Assistance: +3 total (A) with max manual and tactile cues to weight shift hips to recliner    Exercises     PT Diagnosis: Difficulty walking;Generalized weakness;Acute pain  PT Problem List: Decreased strength;Decreased activity tolerance;Decreased balance;Decreased mobility;Decreased knowledge of use of DME;Decreased safety awareness;Pain PT Treatment Interventions: DME instruction;Gait training;Functional mobility training;Therapeutic activities;Therapeutic exercise;Balance  training;Patient/family education   PT Goals Acute Rehab PT Goals PT Goal Formulation: With patient/family Time For Goal Achievement: 12/18/11 Potential to Achieve Goals: Fair Pt will go Supine/Side to Sit: with mod assist PT Goal: Supine/Side to Sit - Progress: Goal set today Pt will Sit at Edge of Bed: with supervision PT Goal: Sit at Edge Of Bed - Progress: Goal set today Pt will go Sit to Supine/Side: with mod assist PT Goal: Sit to Supine/Side - Progress: Goal set today Pt will go Sit to Stand: with max assist PT Goal: Sit to Stand - Progress: Goal set today Pt will go Stand to Sit: with max assist PT Goal: Stand to Sit - Progress: Goal set today Pt will Transfer Bed to Chair/Chair to Bed: with max assist PT Transfer Goal: Bed to Chair/Chair to Bed - Progress: Goal set today  Visit Information  Last PT Received On: 12/04/11 Assistance Needed: +2 PT/OT Co-Evaluation/Treatment: Yes    Subjective Data  Subjective: "I think I'm in [place]." Patient Stated Goal: Family wants pt to return home.   Prior Functioning  Home Living Type of Home: Skilled Nursing Facility [facility]) Additional Comments: Wife would like to take the patient home bad experience at [place] living Prior Function Able to Take Stairs?: No Driving: No Vocation: Retired Musician: No difficulties (mumbles speech) Dominant Hand: Right    Cognition  Overall Cognitive Status: Impaired Area of Impairment: Attention;Memory;Following commands;Awareness of errors;Awareness of deficits Arousal/Alertness: Awake/alert Orientation Level: Disoriented to;Place;Time;Situation Behavior During Session: [facility] for tasks performed Current Attention Level: Sustained Following Commands: Follows one step commands inconsistently;Follows one step commands with increased time Awareness of Errors: Assistance required to identify errors made;Assistance required  to correct errors made    Extremity/Trunk  Assessment Right Upper Extremity Assessment RUE ROM/Strength/Tone: Within functional levels RUE Coordination: WFL - gross motor Left Upper Extremity Assessment LUE ROM/Strength/Tone: Within functional levels LUE Coordination: WFL - gross motor Right Lower Extremity Assessment RLE ROM/Strength/Tone: Deficits RLE ROM/Strength/Tone Deficits: Pt with Rt BKA  full PROM for knee extension Left Lower Extremity Assessment LLE ROM/Strength/Tone: WFL for tasks assessed (at least 3 out 5 gross) Trunk Assessment Trunk Assessment: Kyphotic   Balance Balance Balance Assessed: Yes Static Sitting Balance Static Sitting - Balance Support: Bilateral upper extremity supported;Feet supported Static Sitting - Level of Assistance: 4: Min assist  End of Session PT - End of Session Equipment Utilized During Treatment: Gait belt Activity Tolerance: Patient limited by fatigue Patient left: in chair;with call bell/phone within reach Nurse Communication: Mobility status;Need for lift equipment  GP     Jahvier Aldea 12/04/2011, 1:06 PM [place], PT DPT [phone]

## 2011-12-04 NOTE — Progress Notes (Signed)
Utilization review completed.  

## 2011-12-04 NOTE — Progress Notes (Signed)
Pt being transferred to 2000 per MD order. Report called to receiving RN, family notified of transfer and new room number. Pt will transfer in bed.

## 2011-12-04 NOTE — Progress Notes (Signed)
Clinical Social Work  CSW met with patient after discussing plan with PT. Per PT/OT patient needs several people and lift to transfer patient. PT explained the assistance level that patient would need and spoke about the differences between [facility] and SNF therapy levels. CSW met with dtr Marylene Land) and wife at bedside. Patient was alert and sitting in chair but was quiet again regarding plans. Patient reported he is allowing family to make plans that are in his best interest.   CSW spoke with family regarding their decision for [facility] vs SNF. Dtr reports at this time they are interested in a SNF for a couple of weeks before patient can return home. CSW reviewed list with family who reported they were only interested in [facility] and [facility] at this point. CSW encouraged family to have alternative options in case these facilities were unable to offer a bed. CSW explained that a county wide search could be completed in order to have several options to choose from. Family was not agreeable to this plan but is aware that when patient is medically ready to dc, he cannot stay in hospital if disposition is the only barrier. Family gave CSW permission to fax information to these two facilities.  Dtr and wife asked questions regarding Medicaid. CSW explained that patient would continue to have copays through insurance and gave family referral to [facility]. Per dtr, she will assist wife in completing application and verifying if patient is eligible for any other assistance such as food stamps.  CSW completed FL2 and faxed to these SNFs. [facility] did not extend a bed offer due to not accepting patient's insurance. CSW sent information to [facility] who is currently reviewing information. CSW met with family to explain bed offers. CSW will wait to hear from [facility].   CSW will continue to follow.  [place], [place] [phone]

## 2011-12-04 NOTE — Progress Notes (Signed)
Pt had 75mL urine in catheter collection bag at 0400.   Suprapubic catheter had leaked; split gauze was soaked and gown was saturated.  Advanced catheter 5cm and reinflated balloon.    At 0500, pt had in catheter collection bag. Catheter leaking small amount.  Will continue to monitor.   Maximino Greenland, RN

## 2011-12-04 NOTE — Consult Note (Addendum)
[facility] - CONSULT F/U NOTE [place] TEAM 1 - Stepdown/ICU TEAM  Interim history: 78 year old male with known history of a fib on chronic Coumadin, CAD status post CABG, hypertension, diabetes mellitus2, chronic indwelling catheter status post treatment for prostate cancer who lives in a nursing home who was found to have increasing foot pain since night prior to his admit. Patient's wife removed his sock from his right foot - it was found to be cold and erythematous. He was subsequently been admitted by the Vasc Surgery service and has undergone a R BKA.  Subjective: The pt is resting comfortably in a bedside chair.  He denies pain at rest.  He denies sob, n/v, abdom pain, or chest pain.    Objective: Blood pressure 116/60, pulse 70, temperature 97.6 F (36.4 C), temperature source Oral, resp. rate 17, height 5\' 8"  (1.727 m), weight 61.2 kg (134 lb 14.7 oz), SpO2 94.00%. Weight change:   Intake/Output Summary (Last 24 hours) at 12/04/11 1625 Last data filed at 12/04/11 1200  Gross per 24 hour  Intake 2121.25 ml  Output    775 ml  Net 1346.25 ml   Physical Exam: General appearance: alert, cooperative and in no acute resp distress Lungs: clear to auscultation bilaterally w/o wheeze or focal crackles Heart: irreg irreg - rate controlled - no gallup or rub Abdomen: soft, non-tender; bowel sounds normal; no masses,  no organomegaly Extremities: no signif cyanosis, clubbing, or edema of the L LE - R LE BKA wound dressed and dry   Lab Results:  [facility] 12/04/11 0408 12/03/11 0356  NA 139 140  K 3.8 3.5  CL 104 102  CO2 28 27  GLUCOSE 94 125*  BUN 22 24*  CREATININE 0.91 0.81  CALCIUM 7.1* 7.7*  MG -- --  PHOS -- --    Basename 12/03/11 0356  AST 35  ALT 25  ALKPHOS 119*  BILITOT 0.3  PROT 6.1  ALBUMIN 2.1*    Basename 12/04/11 0408 12/03/11 2149 12/03/11 0356 12/02/11 1954  WBC 10.8* -- 10.2 --  NEUTROABS -- -- -- 11.8*  HGB 10.3* 10.0* -- --  HCT 30.5*  29.7* -- --  MCV 89.4 -- 93.3 --  PLT 213 -- 202 --   Micro Results: Recent Results (from the past 240 hour(s))  URINE CULTURE     Status: Normal   Collection Time   12/02/11  9:18 PM      Component Value Range Status Comment   Specimen Description URINE, CATHETERIZED   Final    Special Requests NONE   Final    Culture  Setup Time [id number]   Final    Colony Count NO GROWTH   Final    Culture NO GROWTH   Final    Report Status 12/03/2011 FINAL   Final   MRSA PCR SCREENING     Status: Abnormal   Collection Time   12/02/11 10:45 PM      Component Value Range Status Comment   MRSA by PCR POSITIVE (*) NEGATIVE Final   CLOSTRIDIUM DIFFICILE BY PCR     Status: Normal   Collection Time   12/02/11 10:58 PM      Component Value Range Status Comment   C difficile by pcr NEGATIVE  NEGATIVE Final    Studies/Results: Dg Chest Port 1 View  12/02/2011  *RADIOLOGY REPORT*  Clinical Data: 78 year old male preoperative study for hip surgery.  PORTABLE CHEST - 1 VIEW  Comparison: 10/30/2011 and earlier.  Findings: Portable  semi upright AP view 2036 hours.  Improved lung volumes and interval resolved patchy right lung opacity.  Left PICC line removed.  Sequelae of CABG.  Cardiac size and mediastinal contours are within normal limits.  No pneumothorax edema or effusion.  IMPRESSION: No acute cardiopulmonary abnormality.  Original Report Authenticated By: Harley Hallmark, M.D.   Medications: Reviewed in detail  Assessment/Plan:  Ischemic Right Foot Per Vascular Surgery - now s/p R BKA  CAD, s/p BYPASS GRAFT asymptomatic - clinically stable   Atrial fibrillation Rate controlled - coumadin reveresed for surgery - now safe to anticoag - bridging w/ lovenox (wrote to increase dose to full anticoag)  Coagulopathy due to coumadin dosing  reversed with FFP only - now cleared to resume anticoag - cover w/ lovenox at full dose + coumadin per pharmacy  Normocytic anemia Receiving 3 U PRBC total - Hgb  holding steady at this time - will need f/u in outpt setting - recheck in AM as well   DM Reasonably well controlled at present - follow w/o change in tx plan today  Chronic pain in right hip 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

## 2011-12-04 NOTE — Progress Notes (Addendum)
Clinical Social Work Department CLINICAL SOCIAL WORK PLACEMENT NOTE 12/04/2011  Patient:  [name], [name]  Account Number:  [id number] Admit date:  12/02/2011  Clinical Social Worker:  Unk Lightning, LCSW  Date/time:  12/04/2011 10:00 AM  Clinical Social Work is seeking post-discharge placement for this patient at the following level of care:   SKILLED NURSING   (*CSW will update this form in Epic as items are completed)   12/03/2011  Patient/family provided with [facility] Department of Clinical Social Work's list of facilities offering this level of care within the geographic area requested by the patient (or if unable, by the patient's family).  12/03/2011  Patient/family informed of their freedom to choose among providers that offer the needed level of care, that participate in Medicare, Medicaid or managed care program needed by the patient, have an available bed and are willing to accept the patient.  12/03/2011  Patient/family informed of [facility]' ownership interest in [facility], as well as of the fact that they are under no obligation to receive care at this facility.  PASARR submitted to EDS on 05/14/2009 PASARR number received from [employer] on 05/14/2009  FL2 transmitted to all facilities in geographic area requested by pt/family on  12/04/2011 FL2 transmitted to all facilities within larger geographic area on   Patient informed that his/her managed care company has contracts with or will negotiate with  certain facilities, including the following:     Patient/family informed of bed offers received:  12/04/11 Patient chooses bed at  Physician recommends and patient chooses bed at    Patient to be transferred to  on   Patient to be transferred to facility by   The following physician request were entered in Epic:   Additional Comments:

## 2011-12-04 NOTE — Plan of Care (Signed)
Problem: Phase I Progression Outcomes Goal: OOB as tolerated unless otherwise ordered Outcome: Progressing Recommend RN staff transfer with hoyer lift only

## 2011-12-04 NOTE — Progress Notes (Signed)
Bilateral lower extremity arterial duplex completed.  Preliminary report is negative for popliteal aneurysms bilaterally.  Possible abscess noted in the right popliteal fossa.

## 2011-12-04 NOTE — Care Management Note (Signed)
    Page 1 of 1   12/04/2011     11:08:41 AM   CARE MANAGEMENT NOTE 12/04/2011  Patient:  [name], [name]   Account Number:  [id number]  Date Initiated:  12/04/2011  Documentation initiated by:  Donn Pierini  Subjective/Objective Assessment:   Pt admitted with ischemic foot,     Action/Plan:   PTA pt lived at [facility] - SNF- -  PT/OT evals   Anticipated DC Date:  12/09/2011   Anticipated DC Plan:  SKILLED NURSING FACILITY  In-house referral  Clinical Social Worker      DC Planning Services  CM consult      Choice offered to / List presented to:             Status of service:  In process, will continue to follow Medicare Important Message given?   (If response is "NO", the following Medicare IM given date fields will be blank) Date Medicare IM given:   Date Additional Medicare IM given:    Discharge Disposition:    Per UR Regulation:  Reviewed for med. necessity/level of care/duration of stay  If discussed at Long Length of Stay Meetings, dates discussed:    Comments:  PCP- [name] D  12/04/11- 1100- Donn Pierini RN, BSN [phone] UR completed, Pt from [facility] PTA, family stating that they may want to take pt home unclear at this time if family will have support needed to care for  pt at home. PT/OT following - CSW following for SNF needs- as family wants to look at other options for SNF placemnt- NCM to continue to follow for d/c needs and planning

## 2011-12-04 NOTE — Progress Notes (Signed)
ANTICOAGULATION CONSULT NOTE - Initial Consult  Pharmacy Consult for Coumadin Indication: atrial fibrillation  Allergies  Allergen Reactions  . Levaquin (Levofloxacin) Rash  . Codeine Other (See Comments)    Noted on MAR  . Penicillins Other (See Comments)    Noted on MAR    Patient Measurements: Height: 5\' 8"  (172.7 cm) Weight: 134 lb 14.7 oz (61.2 kg) IBW/kg (Calculated) : 68.4   Vital Signs: Temp: 97.6 F (36.4 C) (06/26 1237) Temp src: Oral (06/26 1237) BP: 116/60 mmHg (06/26 1237) Pulse Rate: 70  (06/26 1237)  Labs:  Basename 12/04/11 1015 12/04/11 0408 12/03/11 2149 12/03/11 1546 12/03/11 0356 12/02/11 2017 12/02/11 1954  HGB -- 10.3* 10.0* -- -- -- --  HCT -- 30.5* 29.7* 29.7* -- -- --  PLT -- 213 -- -- 202 -- 252  APTT -- -- -- -- 50* -- 72*  LABPROT 22.8* -- 22.1* -- 19.6* -- --  INR 1.97* -- 1.90* -- 1.63* -- --  HEPARINUNFRC -- -- -- -- -- -- --  CREATININE -- 0.91 -- -- 0.81 1.00 --  CKTOTAL -- -- -- -- -- -- --  CKMB -- -- -- -- -- -- --  TROPONINI -- -- -- -- -- -- --    Estimated Creatinine Clearance: 58.8 ml/min (by C-G formula based on Cr of 0.91).   Medical History: Past Medical History  Diagnosis Date  . A-fib   . CAD (coronary artery disease)   . Hyperlipidemia   . Diabetes mellitus   . Hypertension   . Suprapubic catheter   . Peripheral vascular disease   . Arthritis   . Cancer     prostate cancer   Assessment: 78 yo male s/p R BKA on chronic Coumadin for Afib. INR is subtherapeutic today. This is attributable to reversing Coumadin for surgery with FFP and holding doses on 6/24 and 6/25. Patient also on Lovenox for VTE prophylaxis until INR at goal. No bleeding is noted. CBC is stable.  Home dose: 1 mg daily  Goal of Therapy:  INR 2-3    Plan:  1. Coumadin 2 mg po tonight 2. F/U daily INR 3. Discontinue Lovenox when INR >= 2  [facility], [address].D., BCPS Clinical Pharmacist Pager: [phone] 12/04/2011 1:29 PM

## 2011-12-04 NOTE — Evaluation (Addendum)
Occupational Therapy Evaluation Patient Details Name: [name] MRN: [id number] DOB: 04-09-1932 Today's Date: 12/04/2011 Time: 1191-4782 ([phone]) OT Time Calculation (min): 42 min  OT Assessment / Plan / Recommendation Clinical Impression  78 yo male s/p Rt BKA . Pt. will benefit from OT to increase independence and safety in ADLs. OT to follow acutely,.    OT Assessment  Patient needs continued OT Services    Follow Up Recommendations  Skilled nursing facility    Barriers to Discharge      Equipment Recommendations  Defer to next venue    Recommendations for Other Services    Frequency  Min 2X/week    Precautions / Restrictions Precautions Precautions: Fall Restrictions Weight Bearing Restrictions: No   Pertinent Vitals/Pain None reported    ADL  Eating/Feeding: Performed;Set up (mod v/c - terminating task prematurely) Where Assessed - Eating/Feeding: Chair Grooming: Performed;Wash/dry face;Minimal assistance Where Assessed - Grooming: Supine, head of bed up Toilet Transfer: Other (comment) (total +3 pt 10%) Toilet Transfer Method: Stand pivot Toilet Transfer Equipment: Raised toilet seat with arms (or 3-in-1 over toilet) Equipment Used: Gait belt Transfers/Ambulation Related to ADLs: not appropriate at this time ADL Comments: Pt required max v/c and wet wash cloth to (A) with arousal from sleeping. pt disoriented and [place]" Pt educated on location being [facility] and pt states "no". Pt continues to disagree with education provided. Wife reports son called pt 12/03/11 PM hours and told pt that he was driving from [place]. Pt has fixated on the location of [place] since phone call. Pt unable to recall wife birthday but recognizes wife. Pt 's wife requesting to d/c home due to poor care and therapy at SNF placement at last d/c. Pt 's wife educated on the need for DME and hospital bed with tota'l +2 (A) to safely d/c home. Pt 's wife tearful at  times when discussing SNFand ask therapist to recommend "a good facility". Therapist provided wife with a series of questions to ask facility to help in decision making. Pt aroused and alert once sitting in chair. Pt's Rt LE wrap reapplied due to loose fit and to help facilitate proper residual limb forming.     OT Diagnosis: Generalized weakness;Acute pain  OT Problem List:   OT Treatment Interventions: Self-care/ADL training;DME and/or AE instruction;Therapeutic activities;Cognitive remediation/compensation;Patient/family education;Balance training   OT Goals Acute Rehab OT Goals OT Goal Formulation: With family Time For Goal Achievement: 12/18/11 Potential to Achieve Goals: Fair ADL Goals Pt Will Perform Grooming: with set-up;Sitting, chair;Sitting, edge of bed;Supported;with cueing (comment type and amount) (min v/c) ADL Goal: Grooming - Progress: Goal set today Pt Will Perform Upper Body Bathing: with min assist;Sitting, chair;Sitting, edge of bed;Supported ADL Goal: Upper Body Bathing - Progress: Goal set today Pt Will Perform Upper Body Dressing: with min assist;Sitting, chair;Sitting, bed;Supported ADL Goal: Upper Body Dressing - Progress: Goal set today Pt Will Transfer to Toilet: with 2+ total assist;3-in-1;Stand pivot transfer (pt 50%) ADL Goal: Toilet Transfer - Progress: Goal set today Miscellaneous OT Goals Miscellaneous OT Goal #1: Pt will complete bed mobility with total +2 pt 50% as precursor to adls OT Goal: Miscellaneous Goal #1 - Progress: Goal set today  Visit Information  Last OT Received On: 12/04/11 Assistance Needed: +2 PT/OT Co-Evaluation/Treatment: Yes    Subjective Data  Subjective: "I am in [place]"- pt answer to "where are you?:   Prior Functioning  Home Living Type of Home: Skilled Nursing Facility [facility]) Additional Comments:  Wife would like to take the patient home bad experience at [place] living Prior Function Able to Take Stairs?:  No Driving: No Vocation: Retired Comments: per chart review- pt with prolonged bedrest at SNF due to feeling to" ill "to get OOB Communication Communication: No difficulties (mumbles speech) Dominant Hand: Right    Cognition  Overall Cognitive Status: Impaired Area of Impairment: Attention;Memory;Following commands;Awareness of errors;Awareness of deficits Arousal/Alertness: Awake/alert Orientation Level: Disoriented to;Place;Time;Situation Behavior During Session: [facility] for tasks performed Current Attention Level: Sustained Following Commands: Follows one step commands inconsistently;Follows one step commands with increased time Awareness of Errors: Assistance required to identify errors made;Assistance required to correct errors made    Extremity/Trunk Assessment Right Upper Extremity Assessment RUE ROM/Strength/Tone: Within functional levels RUE Coordination: WFL - gross motor Left Upper Extremity Assessment LUE ROM/Strength/Tone: Within functional levels LUE Coordination: WFL - gross motor Right Lower Extremity Assessment RLE ROM/Strength/Tone: Deficits RLE ROM/Strength/Tone Deficits: Pt with Rt BKA  full PROM for knee extension Left Lower Extremity Assessment LLE ROM/Strength/Tone: WFL for tasks assessed (at least 3 out 5 gross) Trunk Assessment Trunk Assessment: Kyphotic   Mobility Bed Mobility Bed Mobility: Supine to Sit;Sitting - Scoot to Edge of Bed Supine to Sit: 1: +2 Total assist;HOB elevated Supine to Sit: Patient Percentage: 10% Sitting - Scoot to Edge of Bed: 1: +2 Total assist Sitting - Scoot to Edge of Bed: Patient Percentage: 10% Details for Bed Mobility Assistance: required use of pad and support for trunk rotation Transfers Transfers: Sit to Stand;Stand to Sit Sit to Stand: From bed;Other (comment) (total +3 Pt 10%) Stand to Sit: Other (comment);To chair/3-in-1;With upper extremity assist (total +3 Pt 10%)   Exercise    Balance Balance Balance Assessed:  Yes Static Sitting Balance Static Sitting - Balance Support: Bilateral upper extremity supported;Feet supported Static Sitting - Level of Assistance: 4: Min assist  End of Session OT - End of Session Activity Tolerance: Patient tolerated treatment well Patient left: in chair;with call bell/phone within reach Nurse Communication: Mobility status;Need for lift equipment     Spoke with family at length about d/c planning, equipment needs and level of (A). Pt's family educated on importance of shaping residual limb with ace wrap and proper positioning to prevent contractures. Daughter present is CNA.   Harrel [facility] 12/04/2011, 10:22 AM Pager: [phone]

## 2011-12-04 NOTE — Progress Notes (Signed)
Physical medicine rehabilitation consult requested. Patient is status post right BKA. [facility] is a resident [facility]. Patient plan per social worker is to return back to skilled nursing facility. Patient would not be a candidate at this time for inpatient rehabilitation services. Will discuss with case management. Will hold on for more rehabilitation consult at this time. His discharge plan was to change please reconsult

## 2011-12-04 NOTE — Progress Notes (Addendum)
12/04/2011 7:28 AM POD 1  Subjective:  No complaints  Tm 100.7 now 97.7  HR  50s-70s irreg   99%2LO2NC Filed Vitals:   12/04/11 0400  BP: 138/69  Pulse: 64  Temp: 97.7 F (36.5 C)  Resp: 18    Physical Exam: Incisions:  C/d/i with staples in tact.   CBC    Component Value Date/Time   WBC 10.8* 12/04/2011 0408   RBC 3.41* 12/04/2011 0408   HGB 10.3* 12/04/2011 0408   HCT 30.5* 12/04/2011 0408   PLT 213 12/04/2011 0408   MCV 89.4 12/04/2011 0408   MCH 30.2 12/04/2011 0408   MCHC 33.8 12/04/2011 0408   RDW 16.5* 12/04/2011 0408   LYMPHSABS 1.3 12/02/2011 1954   MONOABS 1.2* 12/02/2011 1954   EOSABS 0.0 12/02/2011 1954   BASOSABS 0.0 12/02/2011 1954    BMET    Component Value Date/Time   NA 139 12/04/2011 0408   K 3.8 12/04/2011 0408   CL 104 12/04/2011 0408   CO2 28 12/04/2011 0408   GLUCOSE 94 12/04/2011 0408   BUN 22 12/04/2011 0408   CREATININE 0.91 12/04/2011 0408   CREATININE 0.96 04/03/2011 0857   CALCIUM 7.1* 12/04/2011 0408   GFRNONAA 80* 12/04/2011 0408   GFRAA >90 12/04/2011 0408    INR    Component Value Date/Time   INR 1.90* 12/03/2011 2149   INR 1.5 10/14/2011 1404   INR 2.5 07/13/2009 1426     Intake/Output Summary (Last 24 hours) at 12/04/11 0728 Last data filed at 12/04/11 0600  Gross per 24 hour  Intake 3768.75 ml  Output   1225 ml  Net 2543.75 ml     Assessment/Plan:  78 y.o. male is s/p right below amputation  POD 1  -doing well from surgical standpoint this am. -social work consult for placement back to SNF tomorrow -okay to continue anticoagulation for AFib. Will start lovenox -pharmacy for coumadin  -acute surgical blood loss anemia-tolerating. -UOP has decreased some overnight   Doreatha Massed, PA-C [facility] [phone] 12/04/2011 7:28 AM  No popliteal aneurysm Will consider d/c to SNF tomorrow Stable from Vascular Surgery standpoint Will need to consider transfer to medical service if other medical problems need  further eval  Fabienne Bruns, MD [facility] [place] Office: [phone] Pager: [phone]

## 2011-12-04 NOTE — Progress Notes (Signed)
Clinical Social Work  CSW met with patient, wife and dtr in room. CSW explained that [facility] offered a bed. Dtr reports she has toured [facility], counselling and prefers [facility]. Wife plans to tour [facility] in the morning. CSW confirmed with SNF that they could accept patient on 12/05/11 if patient is ready to dc.  CSW will continue to follow to assist with dc needs.  [place], [place] [phone]

## 2011-12-05 ENCOUNTER — Encounter (HOSPITAL_COMMUNITY): Payer: Self-pay | Admitting: Vascular Surgery

## 2011-12-05 ENCOUNTER — Telehealth: Payer: Self-pay | Admitting: Vascular Surgery

## 2011-12-05 LAB — BASIC METABOLIC PANEL
BUN: 18 mg/dL (ref 6–23)
Calcium: 7 mg/dL — ABNORMAL LOW (ref 8.4–10.5)
Creatinine, Ser: 0.88 mg/dL (ref 0.50–1.35)
GFR calc Af Amer: >90 mL/min (ref >90)
GFR calc non Af Amer: 81 mL/min — ABNORMAL LOW (ref >90)

## 2011-12-05 LAB — GLUCOSE, CAPILLARY: Glucose-Capillary: 90 mg/dL (ref 70–99)

## 2011-12-05 LAB — CBC
HCT: 29.6 % — ABNORMAL LOW (ref 39.0–52.0)
MCHC: 33.1 g/dL (ref 30.0–36.0)
Platelets: 266 10*3/uL (ref 150–400)
RDW: 16.3 % — ABNORMAL HIGH (ref 11.5–15.5)

## 2011-12-05 LAB — PROTIME-INR
INR: 3.29 — ABNORMAL HIGH (ref 0.00–1.49)
Prothrombin Time: 34 seconds — ABNORMAL HIGH (ref 11.6–15.2)

## 2011-12-05 MED ORDER — OXYCODONE-ACETAMINOPHEN 5-325 MG PO TABS: 1.0000 | ORAL_TABLET | Freq: Four times a day (QID) | ORAL | Status: DC | PRN

## 2011-12-05 MED ORDER — MEGESTROL ACETATE 40 MG PO TABS
40.0000 mg | ORAL_TABLET | Freq: Every day | ORAL | Status: DC
  Filled 2011-12-05: qty 1

## 2011-12-05 MED ORDER — WARFARIN SODIUM 1 MG PO TABS: 0.5000 mg | ORAL_TABLET | Freq: Every day | ORAL | Status: DC

## 2011-12-05 NOTE — Discharge Summary (Signed)
[facility] Discharge Summary  [name] May 20, 1932 78 y.o. male  [id number]  Admission Date: 12/02/2011  Discharge Date: 12/05/11  Physician: Sherren Kerns, MD  Admission Diagnosis: Blood clot w/ no pulse renal failure   HPI:   This is a 78 y.o. male who presents for evaluation of ischemia right foot. He is a debiliated nursing home pt who has a known right hip fracture. He had been on bedrest because he was too ill to have the fracture repaired. The patient's wife noticed his foot was cold today. He has a history of afib and is on coumadin. INR >3 PTT 72 in ER today. He complains of pain in the foot. He has been non ambulatory since May. His other medical problems include indwelling Foley for prior bladder dysfunction, CAD, hyperlipid, DM, HTN, known PVD, prostate cancer, recent UTI all of these apparently under control. ABI .9 right mainly peroneal, 1.0 left peroneal in January.   Hospital Course:  The patient was admitted to the hospital and taken to the operating room on 12/02/2011 - 12/03/2011 and underwent right BKA.  The pt tolerated the procedure well and was transported to the PACU in good condition. His post op course consisted of getting his pain under control.    Bilateral lower extremity arterial duplex completed. Preliminary report is negative for popliteal aneurysms bilaterally.  He received a dose of coumadin 2mg  x 1 and his INR was 3.29 the next day.  We will discharge him on 1 mg of coumadin starting on 12/06/11 as he was on 3mg  prior to admission and his INR on admission was quite elevated.    CBC    Component Value Date/Time   WBC 9.3 12/05/2011 0500   RBC 3.24* 12/05/2011 0500   HGB 9.8* 12/05/2011 0500   HCT 29.6* 12/05/2011 0500   PLT 266 12/05/2011 0500   MCV 91.4 12/05/2011 0500   MCH 30.2 12/05/2011 0500   MCHC 33.1 12/05/2011 0500   RDW 16.3* 12/05/2011 0500   LYMPHSABS 1.3 12/02/2011 1954   MONOABS 1.2* 12/02/2011 1954   EOSABS 0.0  12/02/2011 1954   BASOSABS 0.0 12/02/2011 1954    BMET    Component Value Date/Time   NA 140 12/05/2011 0500   K 3.8 12/05/2011 0500   CL 105 12/05/2011 0500   CO2 25 12/05/2011 0500   GLUCOSE 85 12/05/2011 0500   BUN 18 12/05/2011 0500   CREATININE 0.88 12/05/2011 0500   CREATININE 0.96 04/03/2011 0857   CALCIUM 7.0* 12/05/2011 0500   GFRNONAA 81* 12/05/2011 0500   GFRAA >90 12/05/2011 0500     Discharge Instructions:   The patient is discharged to home with extensive instructions on wound care and progressive ambulation.  They are instructed not to drive or perform any heavy lifting until returning to see the physician in his office.  Discharge Orders    Future Appointments: Provider: Department: Dept Phone: Center:   01/16/2012 2:00 PM Agapito Games, MD [facility] [phone] [facility]     Future Orders Please Complete By Expires   Resume previous diet      Lifting restrictions      Comments:   No lifting for 6 weeks   Call MD for:  temperature >100.5      Call MD for:  redness, tenderness, or signs of infection (pain, swelling, bleeding, redness, odor or green/yellow discharge around incision site)      Call MD for:  severe or increased pain,  loss or decreased feeling  in affected limb(s)      may wash over wound with mild soap and water         Discharge Diagnosis:  Blood clot w/ no pulse renal failure  Secondary Diagnosis: Patient Active Problem List  Diagnosis  . DIABETES MELLITUS, TYPE II  . DYSLIPIDEMIA  . HYPERTENSION, UNSPECIFIED  . MYOCARDIAL INFARCTION, HX OF  . CAD, ARTERY BYPASS GRAFT  . Atrial fibrillation  . ABDOMINAL ANEURYSM WITHOUT MENTION OF RUPTURE  . KIDNEY DISEASE  . DEGENERATIVE JOINT DISEASE  . PROSTATE CANCER, HX OF  . PERIPHERAL NEUROPATHY  . Dementia  . UTI (lower urinary tract infection)  . Fever  . Ischemic Foot  . Coagulopathy  . Anemia   Past Medical History  Diagnosis Date  . A-fib   . CAD (coronary artery  disease)   . Hyperlipidemia   . Diabetes mellitus   . Hypertension   . Suprapubic catheter   . Peripheral vascular disease   . Arthritis   . Cancer     prostate cancer      Jettie, Lazare  Home Medication Instructions [id number]   Printed on:12/05/11 0825  Medication Information                    fish oil-omega-3 fatty acids 1000 MG capsule Take 1 g by mouth daily.             omeprazole (PRILOSEC OTC) 20 MG tablet Take 20 mg by mouth daily.             atorvastatin (LIPITOR) 80 MG tablet Take 80 mg by mouth daily.           digoxin (LANOXIN) 0.125 MG tablet Take 125 mcg by mouth daily. Hold if HR <60           ramipril (ALTACE) 10 MG tablet Take 10 mg by mouth daily.           metoprolol succinate (TOPROL-XL) 50 MG 24 hr tablet Take 50 mg by mouth 2 (two) times daily. Take with or immediately following a meal.           metFORMIN (GLUCOPHAGE) 500 MG tablet Take 500 mg by mouth 2 (two) times daily.           insulin aspart (NOVOLOG) 100 UNIT/ML injection Inject 5 Units into the skin 3 (three) times daily with meals. If CBG> 150           furosemide (LASIX) 20 MG tablet Take 20 mg by mouth 2 (two) times daily.           mirtazapine (REMERON) 7.5 MG tablet Take 7.5 mg by mouth at bedtime.           calcium carbonate (OS-CAL - DOSED IN MG OF ELEMENTAL CALCIUM) 1250 MG tablet Take 1 tablet by mouth daily.           saccharomyces boulardii (FLORASTOR) 250 MG capsule Take 250 mg by mouth 2 (two) times daily. X 2 weeks; end on 12/06/11           alendronate (FOSAMAX) 70 MG tablet Take 70 mg by mouth every 7 (seven) days. Take with a full glass of water on an empty stomach.           Multiple Vitamin (MULTIVITAMIN WITH MINERALS) TABS Take 1 tablet by mouth daily.           cholecalciferol (VITAMIN D) 1000 UNITS tablet Take  2,000 Units by mouth daily.           vitamin C (ASCORBIC ACID) 500 MG tablet Take 500 mg by mouth 2 (two) times daily.             oxyCODONE-acetaminophen (PERCOCET) 5-325 MG per tablet Take 1 tablet by mouth every 6 (six) hours as needed. For pain. #30 NR          warfarin (COUMADIN) 1 MG tablet Take 0.5 tablets (0.5 mg total) by mouth daily. START THIS ON 12/06/11            START COUMADIN 1MG  ON 12/06/11  Disposition: SNF  Patient's condition: is Limited  Follow up: 1. [name] in 4 weeks 2. Needs INR check on 12/09/11 and adjust coumadin as needed.   Doreatha Massed, PA-C [facility] [phone] 12/05/2011  8:25 AM

## 2011-12-05 NOTE — Telephone Encounter (Addendum)
Message copied by Rosalyn Charters on Thu Dec 05, 2011 10:35 AM ------      Message from: [place], [place] K      Created: Thu Dec 05, 2011  8:54 AM      Regarding: schedule                   ----- Message -----         From: Dara Lords, PA         Sent: 12/05/2011   8:52 AM           To: Sharee Pimple, CMA            Sp BKA            F/u with [facility] in 4 weeks.            Thanks,      SAmantha  notified pt.'s wife of fu appt. with [name] on 01-09-12 3:30

## 2011-12-05 NOTE — Progress Notes (Addendum)
[facility] Progress Note  12/05/2011 8:07 AM POD 2  Subjective:  Feeling better this am.  Tm 99 now afebrile  94% RA Filed Vitals:   12/05/11 0543  BP:   Pulse:   Temp: 98.7 F (37.1 C)  Resp:     Physical Exam: Incisions:  C/d/i with staples in tact.   CBC    Component Value Date/Time   WBC 9.3 12/05/2011 0500   RBC 3.24* 12/05/2011 0500   HGB 9.8* 12/05/2011 0500   HCT 29.6* 12/05/2011 0500   PLT 266 12/05/2011 0500   MCV 91.4 12/05/2011 0500   MCH 30.2 12/05/2011 0500   MCHC 33.1 12/05/2011 0500   RDW 16.3* 12/05/2011 0500   LYMPHSABS 1.3 12/02/2011 1954   MONOABS 1.2* 12/02/2011 1954   EOSABS 0.0 12/02/2011 1954   BASOSABS 0.0 12/02/2011 1954    BMET    Component Value Date/Time   NA 140 12/05/2011 0500   K 3.8 12/05/2011 0500   CL 105 12/05/2011 0500   CO2 25 12/05/2011 0500   GLUCOSE 85 12/05/2011 0500   BUN 18 12/05/2011 0500   CREATININE 0.88 12/05/2011 0500   CREATININE 0.96 04/03/2011 0857   CALCIUM 7.0* 12/05/2011 0500   GFRNONAA 81* 12/05/2011 0500   GFRAA >90 12/05/2011 0500    INR    Component Value Date/Time   INR 3.29* 12/05/2011 0500   INR 1.5 10/14/2011 1404   INR 2.5 07/13/2009 1426     Intake/Output Summary (Last 24 hours) at 12/05/11 0807 Last data filed at 12/04/11 1600  Gross per 24 hour  Intake    120 ml  Output    450 ml  Net   -330 ml     Assessment/Plan:  78 y.o. male is s/p right below knee amputation  POD 2  -no coumadin today as INR increased significantly with one dose of coumadin 2mg .  Will hold today and discharge on 1mg  of coumadin daily.  Have INR checked on Monday. -discharge to SNF today -stump is viable.   Doreatha Massed, PA-C [facility] [phone] 12/05/2011 8:07 AM   BKA healing well INR rapidly increasing probably secondary to increased lovenox dose as well as malnutrition Will d/c to SNF on 1 mg and hold coumadin today  Fabienne Bruns, MD [facility]  [place] Office: [phone] Pager: [phone]   Spoke with [name] and we will discharge pt on1 mg of coumadin daily starting on 12/06/11 with INR check Monday.  He was on 3mg  of coumadin qday prior to admission and his INR was elevated at admission.  Doreatha Massed 12/05/2011 8:29 AM

## 2011-12-05 NOTE — Progress Notes (Addendum)
Physical Therapy Treatment Patient Details Name: [name] MRN: [id number] DOB: 02/22/1932 Today's Date: 12/05/2011 Time: 4098-1191 PT Time Calculation (min): 33 min  PT Assessment / Plan / Recommendation Comments on Treatment Session  Pt ,aking good progress with bed mobility and LE movement, but continues to require encouragement and is limited by LE pain although unable to rate.     Follow Up Recommendations  Skilled nursing facility    Barriers to Discharge        Equipment Recommendations  Defer to next venue    Recommendations for Other Services    Frequency Min 3X/week   Plan Discharge plan remains appropriate;Frequency remains appropriate    Precautions / Restrictions Precautions Precautions: Fall Restrictions Weight Bearing Restrictions: Yes RLE Weight Bearing: Non weight bearing   Pertinent Vitals/Pain Unable to rate, but assessed with PAINAD, yelling with mobility. Nursing made aware    Mobility  Bed Mobility Bed Mobility: Rolling Left Rolling Left: 1: +2 Total assist;With rail (x2) Rolling Left: Patient Percentage: 10% Supine to Sit: 1: +2 Total assist;HOB elevated (30 degrees) Supine to Sit: Patient Percentage: 40% Sitting - Scoot to Edge of Bed: 1: +2 Total assist;With rail Sitting - Scoot to [place] of Bed: Patient Percentage: 10% Sit to Supine: 1: +2 Total assist;HOB flat Sit to Supine: Patient Percentage: 30% Details for Bed Mobility Assistance: Required assist for rolling with pad for hips/trunk and hand placement. Pt yelling with mobility due to RLE pain. For supine<>sit, pt needing assist for bil LEs and trunk, but pt able to advance RLE somewhat on his own. Pt needing cues for hand placement and needed to grip therapist to pull up to sit. Assist scooting to EOB with pad.  Transfers Transfers: Not assessed Ambulation/Gait Ambulation/Gait Assistance: Not tested (comment)    Exercises General Exercises - Lower Extremity Short Arc QuadBarbaraann [name];Right;5 reps;Supine (Assist for lifting with encouragement) Long Arc Quad: AAROM;Right;5 reps;Seated   PT Diagnosis:    PT Problem List:   PT Treatment Interventions:     PT Goals Acute Rehab PT Goals PT Goal: Supine/Side to Sit - Progress: Progressing toward goal PT Goal: Sit at Edge Of Bed - Progress: Progressing toward goal PT Goal: Sit to Supine/Side - Progress: Progressing toward goal  Visit Information  Last PT Received On: 12/05/11 Assistance Needed: +2 PT/OT Co-Evaluation/Treatment: Yes    Subjective Data  Subjective: "I need to get up to the potty" Patient Stated Goal: Go home   Cognition  Overall Cognitive Status: Impaired    Balance  Balance Balance Assessed: Yes Static Sitting Balance Static Sitting - Balance Support: Bilateral upper extremity supported;Feet supported Static Sitting - Level of Assistance: Other (comment) (Pt fluctuating between min-guard and Max assist) Static Sitting - Comment/# of Minutes: Pt able to tolerate sitting x24min EOB with LE therex and static sitting for activity tolerance. Pt needing cues and encouragement to attempt unsupported sitting, but limited due to fatigue and discomfort.   End of Session PT - End of Session Activity Tolerance: Patient limited by fatigue;Patient limited by pain;Patient tolerated treatment well Patient left: in bed;with call bell/phone within reach;with family/visitor present   GP     [name], PT [phone]  12/05/2011, 10:33 AM

## 2011-12-05 NOTE — Progress Notes (Signed)
Occupational Therapy Treatment Patient Details Name: LAWTON DOLLINGER MRN: [id number] DOB: [birth year] Today's Date: 12/05/2011 Time: 7829-5621 OT Time Calculation (min): 33 min  OT Assessment / Plan / Recommendation Comments on Treatment Session Pt requiring verbal and visual cues for direction following for bed mobility.  Requires encouragement for maximum participation.  Progress noted in side to sit and sit to supine.     Follow Up Recommendations  Skilled nursing facility    Barriers to Discharge       Equipment Recommendations  Defer to next venue    Recommendations for Other Services    Frequency Min 2X/week   Plan Discharge plan remains appropriate    Precautions / Restrictions Precautions Precautions: Fall Restrictions Weight Bearing Restrictions: Yes RLE Weight Bearing: Non weight bearing   Pertinent Vitals/Pain Unable to rate R LE pain, but calls out with movement    ADL  Grooming: Performed;Wash/dry hands;Teeth care;Other (comment);Set up (applied chapstick) Where Assessed - Grooming: Supine, head of bed up Transfers/Ambulation Related to ADLs: not appropriate at this time ADL Comments: Attempted bed pan at pt's request.    OT Diagnosis:    OT Problem List:   OT Treatment Interventions:     OT Goals ADL Goals Pt Will Perform Grooming: with set-up;Sitting, chair;Sitting, edge of bed;Supported;with cueing (comment type and amount) ADL Goal: Grooming - Progress: Progressing toward goals Miscellaneous OT Goals Miscellaneous OT Goal #1: Pt will complete bed mobility with total +2 pt 50% as precursor to adls OT Goal: Miscellaneous Goal #1 - Progress: Progressing toward goals  Visit Information  Last OT Received On: 12/05/11 Assistance Needed: +2 PT/OT Co-Evaluation/Treatment: Yes    Subjective Data      Prior Functioning       Cognition  Overall Cognitive Status: Impaired Area of Impairment: Attention;Memory;Following commands;Awareness of  errors;Awareness of deficits Arousal/Alertness: Awake/alert Orientation Level: Disoriented to;Place;Time;Situation Behavior During Session: [facility] for tasks performed Current Attention Level: Sustained Following Commands: Follows one step commands inconsistently;Follows one step commands with increased time Awareness of Errors: Assistance required to identify errors made;Assistance required to correct errors made    Mobility Bed Mobility Bed Mobility: Rolling Left Rolling Left: 1: +2 Total assist;With rail Rolling Left: Patient Percentage: 10% Supine to Sit: 1: +2 Total assist;HOB elevated Supine to Sit: Patient Percentage: 40% Sitting - Scoot to Edge of Bed: 1: +2 Total assist;With rail Sitting - Scoot to [place] of Bed: Patient Percentage: 10% Sit to Supine: 1: +2 Total assist;HOB flat Sit to Supine: Patient Percentage: 30% Details for Bed Mobility Assistance: Required assist for rolling with pad for hips/trunk and hand placement. Pt yelling with mobility due to RLE pain. For supine<>sit, pt needing assist for bil LEs and trunk, but pt able to advance RLE somewhat on his own. Pt needing cues for hand placement and needed to grip therapist to pull up to sit. Assist scooting to EOB with pad.    Exercises   Balance Balance Balance Assessed: Yes Static Sitting Balance Static Sitting - Balance Support: Bilateral upper extremity supported;Feet supported Static Sitting - Level of Assistance: Other (comment) Static Sitting - Comment/# of Minutes: Pt able to tolerate sitting x24min EOB with LE therex and static sitting for activity tolerance. Pt needing cues and encouragement to attempt unsupported sitting, but limited due to fatigue and discomfort.   End of Session OT - End of Session Activity Tolerance: Patient limited by fatigue;Patient limited by pain Patient left: in bed;with bed alarm set;with call bell/phone within reach;with family/visitor present  GO     Evern Bio 12/05/2011,  10:48 AM [phone]

## 2011-12-05 NOTE — Clinical Social Work Placement (Signed)
     Clinical Social Work Department CLINICAL SOCIAL WORK PLACEMENT NOTE 12/05/2011  Patient:  [name], [name]  Account Number:  [id number] Admit date:  12/02/2011  Clinical Social Worker:  Unk Lightning, LCSW  Date/time:  12/04/2011 10:00 AM  Clinical Social Work is seeking post-discharge placement for this patient at the following level of care:   SKILLED NURSING   (*CSW will update this form in Epic as items are completed)   12/03/2011  Patient/family provided with [facility] Department of Clinical Social Works list of facilities offering this level of care within the geographic area requested by the patient (or if unable, by the patients family).  12/03/2011  Patient/family informed of their freedom to choose among providers that offer the needed level of care, that participate in Medicare, Medicaid or managed care program needed by the patient, have an available bed and are willing to accept the patient.  12/03/2011  Patient/family informed of [facility] ownership interest in [facility], as well as of the fact that they are under no obligation to receive care at this facility.  PASARR submitted to EDS on 05/14/2009 PASARR number received from [employer] on 05/14/2009  FL2 transmitted to all facilities in geographic area requested by pt/family on  12/04/2011 FL2 transmitted to all facilities within larger geographic area on   Patient informed that his/her managed care company has contracts with or will negotiate with  certain facilities, including the following:     Patient/family informed of bed offers received:  12/05/2011 Patient chooses bed at [facility], [place] Physician recommends and patient chooses bed at    [facility] [place] on  12/05/2011 Patient to be transferred to facility by EMS  The following physician request were entered in Epic:   Additional Comments:

## 2011-12-06 LAB — TYPE AND SCREEN
ABO/RH(D): O POS
Unit division: 0
Unit division: 0

## 2012-01-08 ENCOUNTER — Encounter: Payer: Self-pay | Admitting: Vascular Surgery

## 2012-01-09 ENCOUNTER — Encounter: Payer: Self-pay | Admitting: Vascular Surgery

## 2012-01-09 ENCOUNTER — Ambulatory Visit (INDEPENDENT_AMBULATORY_CARE_PROVIDER_SITE_OTHER): Payer: Medicare Other | Admitting: Vascular Surgery

## 2012-01-09 VITALS — BP 100/53 | HR 73 | Resp 14 | Ht 68.0 in | Wt 126.0 lb

## 2012-01-09 DIAGNOSIS — I739 Peripheral vascular disease, unspecified: Secondary | ICD-10-CM

## 2012-01-09 NOTE — Progress Notes (Signed)
Patient is a 78 year old male who recently underwent a right below-knee amputation for acute ischemia which was nonsalvageable on June 25. He also has a known right hip fracture. He returns today for further followup. He reports no real pain drainage swelling or other problems with his below-knee amputation.  Physical exam: Filed Vitals:   01/09/12 1507  BP: 100/53  Pulse: 73  Resp: 14  Height: 5\' 8"  (1.727 m)  Weight: 126 lb (57.153 kg)  SpO2: 98%   Right BKA: Incision is healing well. There is no erythema or drainage. Left foot: Pink warm well perfused  Assessment: Healed right below-knee amputation  Plan: Followup as needed; the patient was given a shrinker prescription today for his stump  Fabienne Bruns, MD [facility] Office: [phone] Pager: [phone]

## 2012-01-14 ENCOUNTER — Telehealth: Payer: Self-pay | Admitting: *Deleted

## 2012-01-14 ENCOUNTER — Ambulatory Visit: Payer: Self-pay | Admitting: Family Medicine

## 2012-01-14 DIAGNOSIS — I4891 Unspecified atrial fibrillation: Secondary | ICD-10-CM

## 2012-01-14 NOTE — Progress Notes (Signed)
Wife will call when she gets home to get new dosage.

## 2012-01-14 NOTE — Telephone Encounter (Signed)
LMOM for [name] with new dosage and wife will call when she gets home to get new dosage.

## 2012-01-14 NOTE — Telephone Encounter (Signed)
See anticoag flowsheet

## 2012-01-14 NOTE — Telephone Encounter (Signed)
She called stating pt's PT is 11.1 and INR is 1.1. States pt is on Coumadin 5mg  daily but pt did miss 2 nights of dose. Please advise on new dose.

## 2012-01-15 ENCOUNTER — Other Ambulatory Visit: Payer: Self-pay | Admitting: *Deleted

## 2012-01-15 MED ORDER — AMBULATORY NON FORMULARY MEDICATION: Status: DC

## 2012-01-15 NOTE — Telephone Encounter (Signed)
Pt's wife informed and to come by to pick up meter.

## 2012-01-15 NOTE — Progress Notes (Signed)
Pt's wife informed. Wife to come by and pick up meter.

## 2012-01-16 ENCOUNTER — Telehealth: Payer: Self-pay | Admitting: *Deleted

## 2012-01-16 ENCOUNTER — Ambulatory Visit: Payer: Medicare Other | Admitting: Family Medicine

## 2012-01-16 NOTE — Telephone Encounter (Signed)
VO given for OT for pt. They will be doing 2 times a week for 4 weeks and then 1 time a week x 2 weeks. She will also be faxing papers for you to sign.

## 2012-01-16 NOTE — Telephone Encounter (Signed)
Ok for verbal order  °

## 2012-01-17 DIAGNOSIS — Z4789 Encounter for other orthopedic aftercare: Secondary | ICD-10-CM

## 2012-01-17 DIAGNOSIS — I798 Other disorders of arteries, arterioles and capillaries in diseases classified elsewhere: Secondary | ICD-10-CM

## 2012-01-17 DIAGNOSIS — E1159 Type 2 diabetes mellitus with other circulatory complications: Secondary | ICD-10-CM

## 2012-01-22 ENCOUNTER — Telehealth: Payer: Self-pay | Admitting: *Deleted

## 2012-01-22 ENCOUNTER — Ambulatory Visit: Payer: Self-pay | Admitting: Family Medicine

## 2012-01-22 DIAGNOSIS — I4891 Unspecified atrial fibrillation: Secondary | ICD-10-CM

## 2012-01-22 NOTE — Telephone Encounter (Signed)
Ellen informed and states she will call pt. 

## 2012-01-22 NOTE — Telephone Encounter (Signed)
Alvino Chapel has called stating that pt's PT is 24.5 and INR is 2.3. States pt is taking 4.5mg  on Tuesday and Friday and 3mg  all other days. She would like to know any changes and when he needs a recheck. She also states that on his RT BKA stump that it has an open area that is draining with some blood. States the wife put Betadine on it and she told her to cover it with a gauze . She would like to know if she can get an order for the betadine and cover with gauze or how do you want it dressed. Please advise.

## 2012-01-22 NOTE — Telephone Encounter (Signed)
See flowsheet.  Ok for order for iodine and gauze but really needs to call his Careers adviser. It may need to checked.

## 2012-01-22 NOTE — Progress Notes (Signed)
Alvino Chapel informed and states she will call pt.

## 2012-01-24 ENCOUNTER — Ambulatory Visit: Payer: Medicare Other | Admitting: Family Medicine

## 2012-01-24 ENCOUNTER — Telehealth: Payer: Self-pay | Admitting: *Deleted

## 2012-01-24 NOTE — Telephone Encounter (Signed)
Home health nurse called and states pt has been having persistant diarrhea. Nurse states in her message that he had an appt but wife canceled because she cannot get him here. Wife wants to know what he can take for diarrhea

## 2012-01-24 NOTE — Telephone Encounter (Signed)
Can take one tab of immodium. No more than one in a day.  He needs stool culture for c- diff with recent hospitalization.

## 2012-01-24 NOTE — Telephone Encounter (Signed)
Spouse notified

## 2012-01-28 ENCOUNTER — Other Ambulatory Visit: Payer: Self-pay | Admitting: Family Medicine

## 2012-01-28 DIAGNOSIS — K529 Noninfective gastroenteritis and colitis, unspecified: Secondary | ICD-10-CM

## 2012-01-30 ENCOUNTER — Telehealth: Payer: Self-pay | Admitting: *Deleted

## 2012-01-30 NOTE — Telephone Encounter (Signed)
01-28-12  [name] called to report that [name] stump incision had a 2 cm open area that was draining. Patient is afebrile and the drainage is clear per [facility].  Patient has a bad transportation situation because he can't transition from wheelchair to his car because of deconditioning since his Right BKA  On 12-03-11 by [name]. [name] wife refuses to call a transport service to bring him to our office because of the cost. He was seen by [name] on 01-09-12 and the incision was healed with no drainage. I knew that [name] at [facility] was seeing the patient for shrinker application. I called [name] and he said he was going out to the patient's house regularly to prosthetic care and when he last saw [name] on 01-12-12, his incision / stump was fine. [name] offered to go out to the home on 01-29-12 and assess the situation and report back to [place] on Thursday when [name] is in the office. This plan was OK with the [facility] nurse as they will also be seeing him.   01-30-12 [name] called this morning to report that the patient's stump looked good. He had a small area( less than 0.5 cm) at the medial part of the incision, where the skin folds, the had a very slight amount of clear drainage. Patient had no skin discoloration, fever, wound odor, eschar or pain reported. [name] felt like he had just bumped his leg on the bed rail. [name] couldn't see any tracking of the wound and said that the patient was in a loose shrinker. The patient is very deconditioned but has physical therapy coming out to his house. [name] is doubtful that [name] will get to use a prosthesis for ambulation but may be able to use it for transfers eventually. He will continue to do home visits with Sean. [name] and will call us if he thinks the incision is breaking down. [name] knows to call us if her husband has any fever, redness, wound odor or pain.

## 2012-02-03 LAB — STOOL CULTURE

## 2012-02-04 ENCOUNTER — Telehealth: Payer: Self-pay | Admitting: *Deleted

## 2012-02-04 ENCOUNTER — Ambulatory Visit: Payer: Self-pay | Admitting: Family Medicine

## 2012-02-04 DIAGNOSIS — I4891 Unspecified atrial fibrillation: Secondary | ICD-10-CM

## 2012-02-04 LAB — PROTIME-INR: INR: 3.6 — AB (ref 0.9–1.1)

## 2012-02-04 MED ORDER — AMBULATORY NON FORMULARY MEDICATION: Status: DC

## 2012-02-04 NOTE — Telephone Encounter (Signed)
See anticoagulation flowsheet for adjustments. Printed a prescription for overhead trapeze bar

## 2012-02-04 NOTE — Telephone Encounter (Addendum)
Frances Furbish called stated "INR 3.6" patient on Coumadin Also wants order for over head Trapeze bar Fax number [phone]

## 2012-02-05 ENCOUNTER — Telehealth: Payer: Self-pay | Admitting: Family Medicine

## 2012-02-05 MED ORDER — CHOLESTYRAMINE 4 G PO PACK: 1.0000 | PACK | Freq: Three times a day (TID) | ORAL | Status: DC

## 2012-02-05 NOTE — Telephone Encounter (Signed)
Dr. Linford Arnold Wife c/o that patient is having diarrhea and would like to try Cholestyramine I suggest an office visit but she is unable to bring him in

## 2012-02-05 NOTE — Telephone Encounter (Signed)
Ok, I will call in med. Let call the lab too and see why his c diff test hasn't resulted.  It should have been back by now.

## 2012-02-06 NOTE — Telephone Encounter (Signed)
Can we call the lab and find out why they haven't resulted teh c diff.  We sent if over a week ago. Results should have been back. Has he started the cholestyramine yet to help bind his stool?  Can try decreasing to one fluid pill a day but will not to monitor for increased swelling.

## 2012-02-06 NOTE — Telephone Encounter (Signed)
Wife wants to know if patient should be taking two fluid pills Diarrhea is still occuring 4-5 BM's a day

## 2012-02-07 ENCOUNTER — Inpatient Hospital Stay (HOSPITAL_COMMUNITY)
Admission: EM | Admit: 2012-02-07 | Discharge: 2012-02-19 | DRG: 682 | Disposition: A | Discharge status: Home Health | Payer: Medicare Other | Attending: Internal Medicine | Admitting: Internal Medicine

## 2012-02-07 ENCOUNTER — Telehealth: Payer: Self-pay | Admitting: Family Medicine

## 2012-02-07 ENCOUNTER — Encounter (HOSPITAL_COMMUNITY): Payer: Self-pay

## 2012-02-07 DIAGNOSIS — Z951 Presence of aortocoronary bypass graft: Secondary | ICD-10-CM

## 2012-02-07 DIAGNOSIS — I252 Old myocardial infarction: Secondary | ICD-10-CM

## 2012-02-07 DIAGNOSIS — R0902 Hypoxemia: Secondary | ICD-10-CM | POA: Diagnosis not present

## 2012-02-07 DIAGNOSIS — I5031 Acute diastolic (congestive) heart failure: Secondary | ICD-10-CM

## 2012-02-07 DIAGNOSIS — D689 Coagulation defect, unspecified: Secondary | ICD-10-CM

## 2012-02-07 DIAGNOSIS — F0390 Unspecified dementia without behavioral disturbance: Secondary | ICD-10-CM

## 2012-02-07 DIAGNOSIS — N179 Acute kidney failure, unspecified: Principal | ICD-10-CM

## 2012-02-07 DIAGNOSIS — I714 Abdominal aortic aneurysm, without rupture, unspecified: Secondary | ICD-10-CM

## 2012-02-07 DIAGNOSIS — I10 Essential (primary) hypertension: Secondary | ICD-10-CM

## 2012-02-07 DIAGNOSIS — R633 Feeding difficulties, unspecified: Secondary | ICD-10-CM

## 2012-02-07 DIAGNOSIS — I4891 Unspecified atrial fibrillation: Secondary | ICD-10-CM

## 2012-02-07 DIAGNOSIS — Z79899 Other long term (current) drug therapy: Secondary | ICD-10-CM

## 2012-02-07 DIAGNOSIS — I70209 Unspecified atherosclerosis of native arteries of extremities, unspecified extremity: Secondary | ICD-10-CM | POA: Diagnosis present

## 2012-02-07 DIAGNOSIS — Z96649 Presence of unspecified artificial hip joint: Secondary | ICD-10-CM

## 2012-02-07 DIAGNOSIS — E785 Hyperlipidemia, unspecified: Secondary | ICD-10-CM | POA: Diagnosis present

## 2012-02-07 DIAGNOSIS — D649 Anemia, unspecified: Secondary | ICD-10-CM

## 2012-02-07 DIAGNOSIS — Z66 Do not resuscitate: Secondary | ICD-10-CM | POA: Diagnosis present

## 2012-02-07 DIAGNOSIS — S88119A Complete traumatic amputation at level between knee and ankle, unspecified lower leg, initial encounter: Secondary | ICD-10-CM

## 2012-02-07 DIAGNOSIS — Z880 Allergy status to penicillin: Secondary | ICD-10-CM

## 2012-02-07 DIAGNOSIS — R1310 Dysphagia, unspecified: Secondary | ICD-10-CM

## 2012-02-07 DIAGNOSIS — I509 Heart failure, unspecified: Secondary | ICD-10-CM | POA: Diagnosis present

## 2012-02-07 DIAGNOSIS — E871 Hypo-osmolality and hyponatremia: Secondary | ICD-10-CM | POA: Diagnosis present

## 2012-02-07 DIAGNOSIS — I2510 Atherosclerotic heart disease of native coronary artery without angina pectoris: Secondary | ICD-10-CM | POA: Diagnosis present

## 2012-02-07 DIAGNOSIS — Z923 Personal history of irradiation: Secondary | ICD-10-CM

## 2012-02-07 DIAGNOSIS — R5381 Other malaise: Secondary | ICD-10-CM

## 2012-02-07 DIAGNOSIS — E119 Type 2 diabetes mellitus without complications: Secondary | ICD-10-CM | POA: Diagnosis present

## 2012-02-07 DIAGNOSIS — E46 Unspecified protein-calorie malnutrition: Secondary | ICD-10-CM | POA: Diagnosis present

## 2012-02-07 DIAGNOSIS — E860 Dehydration: Secondary | ICD-10-CM

## 2012-02-07 DIAGNOSIS — Z7901 Long term (current) use of anticoagulants: Secondary | ICD-10-CM

## 2012-02-07 DIAGNOSIS — R1312 Dysphagia, oropharyngeal phase: Secondary | ICD-10-CM | POA: Diagnosis present

## 2012-02-07 DIAGNOSIS — I959 Hypotension, unspecified: Secondary | ICD-10-CM

## 2012-02-07 DIAGNOSIS — A088 Other specified intestinal infections: Secondary | ICD-10-CM | POA: Diagnosis present

## 2012-02-07 DIAGNOSIS — Z8249 Family history of ischemic heart disease and other diseases of the circulatory system: Secondary | ICD-10-CM

## 2012-02-07 DIAGNOSIS — K222 Esophageal obstruction: Secondary | ICD-10-CM

## 2012-02-07 DIAGNOSIS — I25810 Atherosclerosis of coronary artery bypass graft(s) without angina pectoris: Secondary | ICD-10-CM

## 2012-02-07 DIAGNOSIS — Z87891 Personal history of nicotine dependence: Secondary | ICD-10-CM

## 2012-02-07 DIAGNOSIS — Z8546 Personal history of malignant neoplasm of prostate: Secondary | ICD-10-CM

## 2012-02-07 DIAGNOSIS — Z833 Family history of diabetes mellitus: Secondary | ICD-10-CM

## 2012-02-07 DIAGNOSIS — R001 Bradycardia, unspecified: Secondary | ICD-10-CM | POA: Diagnosis present

## 2012-02-07 DIAGNOSIS — R197 Diarrhea, unspecified: Secondary | ICD-10-CM

## 2012-02-07 DIAGNOSIS — J690 Pneumonitis due to inhalation of food and vomit: Secondary | ICD-10-CM

## 2012-02-07 DIAGNOSIS — Z881 Allergy status to other antibiotic agents status: Secondary | ICD-10-CM

## 2012-02-07 DIAGNOSIS — I498 Other specified cardiac arrhythmias: Secondary | ICD-10-CM | POA: Diagnosis present

## 2012-02-07 DIAGNOSIS — I5033 Acute on chronic diastolic (congestive) heart failure: Secondary | ICD-10-CM | POA: Diagnosis present

## 2012-02-07 HISTORY — DX: Acute myocardial infarction, unspecified: I21.9

## 2012-02-07 LAB — CBC WITH DIFFERENTIAL/PLATELET
Basophils Relative: 0 % (ref 0–1)
Hemoglobin: 7.5 g/dL — ABNORMAL LOW (ref 13.0–17.0)
Lymphocytes Relative: 29 % (ref 12–46)
Lymphs Abs: 3.5 10*3/uL (ref 0.7–4.0)
MCHC: 32.9 g/dL (ref 30.0–36.0)
Monocytes Relative: 11 % (ref 3–12)
Neutro Abs: 7 10*3/uL (ref 1.7–7.7)
Neutrophils Relative %: 58 % (ref 43–77)
RBC: 2.62 MIL/uL — ABNORMAL LOW (ref 4.22–5.81)
WBC: 12.1 10*3/uL — ABNORMAL HIGH (ref 4.0–10.5)

## 2012-02-07 LAB — BASIC METABOLIC PANEL
Chloride: 103 mEq/L (ref 96–112)
GFR calc Af Amer: 53 mL/min — ABNORMAL LOW (ref >90)
GFR calc non Af Amer: 46 mL/min — ABNORMAL LOW (ref >90)
Potassium: 4.4 mEq/L (ref 3.5–5.1)
Sodium: 133 mEq/L — ABNORMAL LOW (ref 135–145)

## 2012-02-07 MED ORDER — SODIUM CHLORIDE 0.9 % IV SOLN
INTRAVENOUS | Status: DC
  Administered 2012-02-07: 23:00:00 via INTRAVENOUS
  Administered 2012-02-07: 100 mL/h via INTRAVENOUS
  Administered 2012-02-08: 13:00:00 via INTRAVENOUS
  Administered 2012-02-08: 1000 mL via INTRAVENOUS

## 2012-02-07 NOTE — ED Notes (Signed)
Pt sitting up comfortably in bed. Respirations even and regular. NAD. Denies pain. Denies SOB. Updated on plan of care: aware of consult to cardiology.  No further needs at this time. Family at bedside.

## 2012-02-07 NOTE — Consult Note (Signed)
CARDIOLOGY CONSULT NOTE  Patient ID: [name] MRN: [id number] DOB/AGE: 08/31/1931 78 y.o.  Admit date: 02/07/2012 Primary Physician METHENEY,CATHERINE, MD Primary Cardiologist   Dr. Excell Seltzer Chief Complaint   Hypotension  HPI:   The patient has a history of CAD/CABG, atrial fibrillation and PVD.  The patient was last in the hospital in June with an arterial thrombosis requiring right BKA.  He has had a persistent diarrhea for the past month.  He is brought to the ER today because of hypotension.  He is noted to be in his chronic atrial fib with a slow ventricular rate.  Also of note his BUN is 41 and creat 1.42. (Previous 18 and .88)   Hgb is 7.5.  (Previous 8.7)  He came to the emergency room today after he was unable to see a primary provider.  He has had failure to thrive since his amputation.  He has lost over 25 pounds. He's had 5-6 watery voluminous diarrhea today. The etiology of this is not clear though C. difficile is not resolved in the computer. Today he came to the emergency room he was so weak he was having trouble sitting up. He has been working with physical therapy and transferring with great help from bed to chair but hasn't been able to do this. He's not been eating either is having difficulty swallowing with coughing and gagging after food. He also exacerbates the diarrhea. Is not noticing any palpitations. He's not actually have presyncope or syncope. His chest pressure, neck or arm discomfort.   Past Medical History  Diagnosis Date  . A-fib   . CAD (coronary artery disease)   . Hyperlipidemia   . Diabetes mellitus   . Hypertension   . Suprapubic catheter   . Peripheral vascular disease   . Arthritis   . Cancer     prostate cancer    Past Surgical History  Procedure Date  . Prostatectomy     radiation therapy  . Bladder cath   . Laminectomy     lumbar  . Lumbar disc surgery   . Right colectomy   . Incisional hernia repair   . Oronary artery bypass   .  Total hip arthroplasty     RT  . Cataract extraction 2012    Left 03/2011, Right 04/2011  . Amputation 12/03/2011    Procedure: AMPUTATION BELOW KNEE;  Surgeon: Sherren Kerns, MD;  Location: [facility] OR;  Service: Vascular;  Laterality: Right;    Allergies  Allergen Reactions  . Levaquin (Levofloxacin) Rash  . Codeine Other (See Comments)    Noted on MAR  . Penicillins Other (See Comments)    Noted on MAR    ATORVASTATIN CALCIUM 40 MG PO TABS  DIGOXIN 0.125 MG PO TABS FUROSEMIDE 20 MG PO TABS    Take 20 mg by mouth 2 (two) times daily.  METFORMIN HCL 500 MG PO TABS    Take 500 mg by mouth 2 (two) times daily.  METOPROLOL SUCCINATE ER 50 MG PO TB24    Take 100 mg by mouth 2 (two) times daily. Take with or immediately following a meal.  OLANZAPINE 5 MG PO TABS   Take 5 mg by mouth at bedtime.  OLANZAPINE-FLUOXETINE HCL 6-25 MG PO CAPS    Take 1 capsule by mouth at bedtime.  FISH OIL 1200 MG PO CAPS    Take 1 capsule by mouth daily.  OMEPRAZOLE 20 MG PO CPDR    Take 20 mg by mouth daily.  WARFARIN SODIUM 3 MG PO TABS     (Not in a hospital admission) Family History  Problem Relation Age of Onset  . Coronary artery disease      siblings  . Heart attack Father   . Diabetes Sister   . Hyperlipidemia      family history    History   Social History  . Marital Status: Married    Spouse Name: Louie Casa    Number of Children: N/A  . Years of Education: N/A   Occupational History  . Retired Merchandiser, retail for [employer].     Social History Main Topics  . Smoking status: Former Smoker    Types: Cigarettes    Quit date: 06/10/1970  . Smokeless tobacco: Not on file  . Alcohol Use: No  . Drug Use: No  . Sexually Active: Not Currently   Other Topics Concern  . 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 No caffeine.       ROS:  As stated in the HPI and negative for all other systems.  Physical Exam: Blood pressure  94/44, temperature 98.2 F (36.8 C), temperature source Oral, resp. rate 19, SpO2 97.00%.  GENERAL:  Chronically ill appearing HEENT:  Pupils equal round and reactive, fundi not visualized, oral mucosa unremarkable NECK:  No jugular venous distention, waveform within normal limits, carotid upstroke brisk and symmetric, no bruits, no thyromegaly LYMPHATICS:  No cervical, inguinal adenopathy LUNGS:  Clear to auscultation bilaterally BACK:  No CVA tenderness CHEST:  Unremarkable HEART:  PMI not displaced or sustained,S1 and S2 within normal limits, no S3,  no clicks, no rubs, no murmurs, irregular ABD:  Flat, positive bowel sounds normal in frequency in pitch, no bruits, no rebound, no guarding, no midline pulsatile mass, no hepatomegaly, no splenomegaly EXT:  2 plus pulses throughout, no edema, no cyanosis no clubbing, right BKA SKIN:  No rashes no nodules NEURO:  Cranial nerves II through XII grossly intact, motor grossly intact throughout PSYCH:  Cognitively intact, oriented to person place and time  Labs: Lab Results  Component Value Date   BUN 41* 02/07/2012   Lab Results  Component Value Date   CREATININE 1.42* 02/07/2012   Lab Results  Component Value Date   NA 133* 02/07/2012   K 4.4 02/07/2012   CL 103 02/07/2012   CO2 18* 02/07/2012    Lab Results  Component Value Date   WBC 12.1* 02/07/2012   HGB 7.5* 02/07/2012   HCT 22.8* 02/07/2012   MCV 87.0 02/07/2012   PLT 273 02/07/2012    EKG:   Atrial fib, rate 57, right bundle branch block, left anterior fascicular block, rate 50s, no acute ST-T wave changes.  ASSESSMENT AND PLAN:        Atrial fibrillation:   This is not the etiology of his hypotension.  He does seem to be tolerating his warfarin and should continue this.  I would hold the beta blocker and digoxin.  No further work up is indicated.  Hypotension:  He is dehydrated.  He should have his diuretic held and be gently hydrated.  As above hold the beta blocker  CAD:   No evidence of active ischemia  Diarrhea:  This is the cause of the dehydration.  Further work up is suggested.  Consider GI consult.  Check stool for C diff.    Anemia:  Seems to be chronic.  No recent evidence of bleeding.  Work up per primary team.    Signed: Rollene Rotunda 02/07/2012, 8:54 PM    '

## 2012-02-07 NOTE — ED Provider Notes (Signed)
History     CSN: [id number]  Arrival date & time 02/07/12  1839   First MD Initiated Contact with Patient 02/07/12 1858      Chief Complaint  Patient presents with  . Hypotension    HPI  78 y/o man with a history of PVD and CAD, presenting with 1 month of diarrhea.  End of June he was here at [facility] for a BKA.  At dischrage, he was sent to a SNF.  He was there about 1 month and has now been home for about 1 month.  Since leaving the SNF he has had diarrhea consistently.  Stool is brown in color and loose to watery.  Frequency is several times a day.  There are no other associated symtpoms.  Pt denies dizziness, chest pain, dyspnea, abdominal pain, fevers, and chills.  He has an indwelling urinary catheter for the past 3 years, but he and his wife do not suspect a UTI.  Stool cultures ordered by PCP did not grow a pathogen.  C diff was ordered but never resulted.  There have been no changes to his medicines recently.  He has tried loperamide, which offers only transient relief.   Past Medical History  Diagnosis Date  . A-fib   . CAD (coronary artery disease)   . Hyperlipidemia   . Diabetes mellitus   . Hypertension   . Suprapubic catheter   . Peripheral vascular disease   . Arthritis   . Cancer     prostate cancer    Past Surgical History  Procedure Date  . Prostatectomy     radiation therapy  . Bladder cath   . Laminectomy     lumbar  . Lumbar disc surgery   . Right colectomy   . Incisional hernia repair   . Oronary artery bypass   . Total hip arthroplasty     RT  . Cataract extraction 2012    Left 03/2011, Right 04/2011  . Amputation 12/03/2011    Procedure: AMPUTATION BELOW KNEE;  Surgeon: Sherren Kerns, MD;  Location: [facility] OR;  Service: Vascular;  Laterality: Right;    Family History  Problem Relation Age of Onset  . Coronary artery disease      siblings  . Heart attack Father   . Diabetes Sister   . Hyperlipidemia      family history    History  Substance  Use Topics  . Smoking status: Former Smoker    Types: Cigarettes    Quit date: 06/10/1970  . Smokeless tobacco: Not on file  . Alcohol Use: No      Review of Systems  All other systems reviewed and are negative.    Allergies  Levaquin; Codeine; and Penicillins  Home Medications   Current Outpatient Rx  Name Route Sig Dispense Refill  . ATORVASTATIN CALCIUM 40 MG PO TABS Oral Take 40 mg by mouth at bedtime.    Marland Kitchen DIGOXIN 0.125 MG PO TABS Oral Take 125 mcg by mouth daily. Hold if HR <60    . FUROSEMIDE 20 MG PO TABS Oral Take 20 mg by mouth 2 (two) times daily.    Marland Kitchen METFORMIN HCL 500 MG PO TABS Oral Take 500 mg by mouth 2 (two) times daily.    Marland Kitchen METOPROLOL SUCCINATE ER 50 MG PO TB24 Oral Take 100 mg by mouth 2 (two) times daily. Take with or immediately following a meal.    . OLANZAPINE 5 MG PO TABS Oral Take 5 mg by  mouth at bedtime.    Marland Kitchen OLANZAPINE-FLUOXETINE HCL 6-25 MG PO CAPS Oral Take 1 capsule by mouth at bedtime.    Marland Kitchen FISH OIL 1200 MG PO CAPS Oral Take 1 capsule by mouth daily.    Marland Kitchen OMEPRAZOLE 20 MG PO CPDR Oral Take 20 mg by mouth daily.    . WARFARIN SODIUM 3 MG PO TABS Oral Take 1.5-3 mg by mouth See admin instructions. Take 1.5mg  on Tues & Fri & 3mg  on all other days      BP 121/67  Temp 98.2 F (36.8 C) (Oral)  Resp 21  SpO2 98%  Physical Exam  Constitutional: He does not appear ill. No distress.  HENT:  Mouth/Throat: Mucous membranes are dry.  Eyes: Conjunctivae and EOM are normal.  Neck: Neck supple.  Cardiovascular: Regular rhythm.  Bradycardia present.  Exam reveals distant heart sounds.   Pulmonary/Chest: Effort normal and breath sounds normal.  Abdominal: Soft. Bowel sounds are normal. He exhibits no mass. There is no hepatosplenomegaly. There is no tenderness.  Skin:       Abnormal turgor    ED Course  Procedures (including critical care time)   7:25 PM - Pt assessed.  BMET, CBC w/, C diff PCR, and EKG ordered.  Pt has already received 1500cc  IVF.  8:22 PM - Pt reassessed.  Resting comfortably in bed.  Hypotensive but asymptomatic.  10:26 PM - Pt reassessed.  Resting comfortably in bed.  Hypotensive but asymptomatic.  Spoke with [facility].  She will see him and admit.   Labs Reviewed  BASIC METABOLIC PANEL - Abnormal; Notable for the following:    Sodium 133 (*)     CO2 18 (*)     BUN 41 (*)     Creatinine, Ser 1.42 (*)     Calcium 6.5 (*)     GFR calc non Af Amer 46 (*)     GFR calc Af Amer 53 (*)     All other components within normal limits  CBC WITH DIFFERENTIAL - Abnormal; Notable for the following:    WBC 12.1 (*)     RBC 2.62 (*)     Hemoglobin 7.5 (*)     HCT 22.8 (*)     RDW 17.6 (*)     Monocytes Absolute 1.3 (*)     All other components within normal limits  CLOSTRIDIUM DIFFICILE BY PCR    No imaging results.   EKG Results: Rate:  57 PR:  -- QRS:  134 QTc:  448 EKG: atrial fibrillation, rate 57, RBBB.   1. Atrial fibrillation   2. Diarrhea   3. Hypotension      MDM  1.   Diarrhea:  Stool cultures from his PCP essentially rule out baceterial gastroenteritis, and viral gastroenteritis lasting 4 weeks would be very unlikely.  He has risk factors for C diff including antibiotic therapy when here for his BKA and time in a SNF.  Leukocytosis supports C diff colitis.  Stool sample not obtained for C diff PCR at time of admission.  2.   Hypotension:  Clinically hypovolemic on exam with dry membranes and increased turgor.  Cardiologist believes solely attributable to hypovolemia from diarrhea.  Hyponatremia, alkalosis, and elevated BUN support GI loss hypovolemia.  Lollie Sails, MD 02/07/12 2234

## 2012-02-07 NOTE — ED Notes (Signed)
Pt sitting up in  Bed. A.O. X 4. NAD. Wife and son at bedside. Denies pain. No further requests at this time.

## 2012-02-07 NOTE — H&P (Signed)
PCP:   [name], MD  Cardiology: Dr. Excell Seltzer  Chief Complaint:   Diarrhea, weakness  HPI: [name] is a 78 y.o. male   has a past medical history of A-fib; CAD (coronary artery disease); Hyperlipidemia; Diabetes mellitus; Hypertension; Suprapubic catheter; Peripheral vascular disease; Arthritis; and Cancer.   Presented with  He has had diarrhea for 1 month ever since he left nursing home. 4-5 BM a day his PCP sent stool culture but they have not heard any results. For the past one week it has become severe. He is getting very weak and unable to walk or even transfer to the whealchair. His home health nurse called his primary care doctor and he was sent to emergency department. in ED  He was found to be severely dehydrated and hypotensive. Denies any blood in the stool it is very watery no improvement with imodium.  He has indwelling catheter since 2006 since prostate cancer. 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 He has had recurrent urinary tract  infections in the past.    Review of Systems:    Pertinent positives include: fatigue, weight loss diarrhea,   Constitutional:  No weight loss, night sweats, Fevers, chills, HEENT:  No headaches, Difficulty swallowing,Tooth/dental problems,Sore throat,  No sneezing, itching, ear ache, nasal congestion, post nasal drip,  Cardio-vascular:  No chest pain, Orthopnea, PND, anasarca, dizziness, palpitations.no Bilateral lower extremity swelling  GI:  No heartburn, indigestion, abdominal pain, nausea, vomiting, change in bowel habits, loss of appetite, melena, blood in stool, hematemesis Resp:  no shortness of breath at rest. No dyspnea on exertion, No excess mucus, no productive cough, No non-productive cough, No coughing up of blood.No change in color of mucus.No wheezing. Skin:  no rash or lesions. No jaundice GU:  no dysuria, change in color of urine, no urgency or frequency. No straining to  urinate.  No flank pain.  Musculoskeletal:  No joint pain or no joint swelling. No decreased range of motion. No back pain.  Psych:  No change in mood or affect. No depression or anxiety. No memory loss.  Neuro: no localizing neurological complaints, no tingling, no weakness, no double vision, no gait abnormality, no slurred speech, no confusion  Otherwise ROS are negative except for above, 10 systems were reviewed  Past Medical History: Past Medical History  Diagnosis Date  . A-fib   . CAD (coronary artery disease)   . Hyperlipidemia   . Diabetes mellitus   . Hypertension   . Suprapubic catheter   . Peripheral vascular disease   . Arthritis   . Cancer     prostate cancer   Past Surgical History  Procedure Date  . Prostatectomy     radiation therapy  . Bladder cath   . Laminectomy     lumbar  . Lumbar disc surgery   . Right colectomy   . Incisional hernia repair   . Coronary artery bypass graft     2008  . Total hip arthroplasty     RT  . Cataract extraction 2012    Left 03/2011, Right 04/2011  . Amputation 12/03/2011    Procedure: AMPUTATION BELOW KNEE;  Surgeon: Sherren Kerns, MD;  Location: [facility] OR;  Service: Vascular;  Laterality: Right;     Medications: Prior to Admission medications   Medication Sig Start Date End Date Taking? Authorizing Provider  atorvastatin (LIPITOR) 40 MG tablet Take 40 mg by mouth at bedtime.   Yes Historical Provider, MD  digoxin (LANOXIN) 0.125 MG tablet Take 125 mcg by mouth daily. Hold if HR <60   Yes Historical Provider, MD  furosemide (LASIX) 20 MG tablet Take 20 mg by mouth 2 (two) times daily.   Yes Historical Provider, MD  metFORMIN (GLUCOPHAGE) 500 MG tablet Take 500 mg by mouth 2 (two) times daily.   Yes Historical Provider, MD  metoprolol succinate (TOPROL-XL) 50 MG 24 hr tablet Take 100 mg by mouth 2 (two) times daily. Take with or immediately following a meal.   Yes Historical Provider, MD  OLANZapine (ZYPREXA) 5 MG  tablet Take 5 mg by mouth at bedtime.   Yes Historical Provider, MD  olanzapine-FLUoxetine (SYMBYAX) 6-25 MG per capsule Take 1 capsule by mouth at bedtime.   Yes Historical Provider, MD  Omega-3 Fatty Acids (FISH OIL) 1200 MG CAPS Take 1 capsule by mouth daily.   Yes Historical Provider, MD  omeprazole (PRILOSEC) 20 MG capsule Take 20 mg by mouth daily.   Yes Historical Provider, MD  warfarin (COUMADIN) 3 MG tablet Take 1.5-3 mg by mouth See admin instructions. Take 1.5mg  on Tues & Fri & 3mg  on all other days   Yes Historical Provider, MD    Allergies:   Allergies  Allergen Reactions  . Levaquin (Levofloxacin) Rash  . Codeine Other (See Comments)    Noted on MAR  . Penicillins Other (See Comments)    Noted on MAR    Social History:  Ambulatory bed bound at home with home health Lives at  Home with wife. And PT/OT   reports that he quit smoking about 41 years ago. His smoking use included Cigarettes. He quit after 10 years of use. He does not have any smokeless tobacco history on file. He reports that he does not drink alcohol or use illicit drugs.   Family History: family history includes Coronary artery disease in his father and unspecified family member; Diabetes in his sister; and Hyperlipidemia in an unspecified family member.    Physical Exam: Patient Vitals for the past 24 hrs:  BP Temp Temp src Pulse Resp SpO2  02/07/12 2207 100/58 mmHg - - 53  - 100 %  02/07/12 2009 94/44 mmHg - - - 19  97 %  02/07/12 1840 - - - - - 98 %  02/07/12 1839 121/67 mmHg 98.2 F (36.8 C) Oral - 21  98 %    1. General:  in No Acute distress 2. Psychological: Alert but not Oriented 3. Head/ENT:   Dry Mucous Membranes                          Head Non traumatic, neck supple                          Normal  Dentition 4. SKIN:  decreased Skin turgor,  Skin clean Dry and intact no rash occasional bruises noted 5. Heart: Regular rate and rhythm no Murmur, Rub or gallop 6. Lungs: Clear to  auscultation bilaterally, no wheezes or crackles   7. Abdomen: Soft, non-tender, Non distended 8. Lower extremities: no clubbing, cyanosis, or edema 9. Neurologically Grossly intact, moving all 4 extremities equally 10. MSK: Normal range of motion  body mass index is unknown because there is no height or weight on file.   Labs on Admission:   [facility] 02/07/12 1933  NA 133*  K 4.4  CL 103  CO2 18*  GLUCOSE 96  BUN 41*  CREATININE 1.42*  CALCIUM 6.5*  MG --  PHOS --   No results found for this basename: AST:2,ALT:2,ALKPHOS:2,BILITOT:2,PROT:2,ALBUMIN:2 in the last 72 hours No results found for this basename: LIPASE:2,AMYLASE:2 in the last 72 hours  Basename 02/07/12 1933  WBC 12.1*  NEUTROABS 7.0  HGB 7.5*  HCT 22.8*  MCV 87.0  PLT 273   No results found for this basename: CKTOTAL:3,CKMB:3,CKMBINDEX:3,TROPONINI:3 in the last 72 hours No results found for this basename: TSH,T4TOTAL,FREET3,T3FREE,THYROIDAB in the last 72 hours No results found for this basename: VITAMINB12:2,FOLATE:2,FERRITIN:2,TIBC:2,IRON:2,RETICCTPCT:2 in the last 72 hours Lab Results  Component Value Date   HGBA1C 6.9 09/16/2011    The CrCl is unknown because both a height and weight (above a minimum accepted value) are required for this calculation. ABG    Component Value Date/Time   TCO2 23 12/02/2011 2017     No results found for this basename: DDIMER     Cultures:    Component Value Date/Time   SDES URINE, CATHETERIZED 12/02/2011 2118   SPECREQUEST NONE 12/02/2011 2118   CULT NO GROWTH 12/02/2011 2118   REPTSTATUS 12/03/2011 FINAL 12/02/2011 2118       Radiological Exams on Admission: No results found.  Chart has been reviewed  Assessment/Plan  78 year old gentleman with diarrhea for one month now with dehydration, debility and electrolytes abnormalities and in acute renal failure. He is hypotensive and bradycardic likely secondary to dehydration and his medications.  Present on  Admission:  .Diarrhea - etiology at this point not clear will send stool cultures and stool studies C. difficile PCR was already ordered. At this patient is nonfebrile. Will hold off on initiation of antibiotics for now.. Potentially offensive medications. Consider GI consult if no improvement.  Marland KitchenDIABETES MELLITUS, TYPE II - sensitive sliding scale  .Atrial fibrillation - will hold metToprol and digoxin given bradycardia, hold warfarin given severe anemia  .Dehydration - continue with IV fluids and monitor her urine output  .Hypocalcemia - will replace   .Acute renal failure - - likely secondary to dehydration, check FeNA and if not improved with IVF would obtain renal US and consider renal consult.   .Debility - will have PT OT evaluation. He has home health. His family is weiry about nursing home placement secondary to bad experiences  .Hypotension - most likely secondary to dehydration response to fluids patient is minimally symptomatic.  we'll monitor vitals carefully   .Anemia - will obtain anemia panel and type and screen Hemoccult stool and hold warfarin he denies any blood in stool    Prophylaxis:on coumadin Protonix  CODE STATUS: DNR/DNI as per patient and family wishes   Other plan as per orders.  I have spent a total of 55 min on this admission  Eastyn Dattilo 02/07/2012, 11:01 PM

## 2012-02-07 NOTE — ED Notes (Signed)
Pt. Reports hx of diarrhea x 1 month post hospitalization. Pt. Hypotensive x2 days, states systolic BP has been in the 80s. Pt. BP on arrival, systolic 121. Pt. HR afib brady, hx of afib. Pt. Denies dizziness, weakness, fatigue.

## 2012-02-07 NOTE — ED Notes (Signed)
Dr Wallace at bedside

## 2012-02-07 NOTE — Telephone Encounter (Signed)
[name] called about patient condition and B/P 80/58  Called made to wife to take pt to hospital to be evaluated per Dr. Linford Arnold

## 2012-02-07 NOTE — Telephone Encounter (Addendum)
Called lab and c-dif is still pending. It was sent out on 8/23 and there is a 14 day turnaround

## 2012-02-07 NOTE — ED Notes (Signed)
Per EMS, pt hypotension x2 days per home health. Hx of diarrhea x1 month post hospitalization. Pt has no complaints

## 2012-02-08 ENCOUNTER — Encounter (HOSPITAL_COMMUNITY): Payer: Self-pay | Admitting: *Deleted

## 2012-02-08 ENCOUNTER — Inpatient Hospital Stay (HOSPITAL_COMMUNITY): Payer: Medicare Other

## 2012-02-08 DIAGNOSIS — R197 Diarrhea, unspecified: Secondary | ICD-10-CM

## 2012-02-08 DIAGNOSIS — I498 Other specified cardiac arrhythmias: Secondary | ICD-10-CM

## 2012-02-08 DIAGNOSIS — E860 Dehydration: Secondary | ICD-10-CM

## 2012-02-08 DIAGNOSIS — N179 Acute kidney failure, unspecified: Principal | ICD-10-CM

## 2012-02-08 LAB — GLUCOSE, CAPILLARY
Glucose-Capillary: 98 mg/dL (ref 70–99)
Glucose-Capillary: 123 mg/dL — ABNORMAL HIGH (ref 70–99)

## 2012-02-08 LAB — CBC
HCT: 22.4 % — ABNORMAL LOW (ref 39.0–52.0)
Hemoglobin: 7.2 g/dL — ABNORMAL LOW (ref 13.0–17.0)
MCHC: 32.1 g/dL (ref 30.0–36.0)

## 2012-02-08 LAB — URINALYSIS, ROUTINE W REFLEX MICROSCOPIC
Bilirubin Urine: NEGATIVE
Specific Gravity, Urine: 1.012 (ref 1.005–1.030)
Urobilinogen, UA: 0.2 mg/dL (ref 0.0–1.0)
pH: 5 (ref 5.0–8.0)

## 2012-02-08 LAB — COMPREHENSIVE METABOLIC PANEL
ALT: 7 U/L (ref 0–53)
AST: 11 U/L (ref 0–37)
Calcium: 6.6 mg/dL — ABNORMAL LOW (ref 8.4–10.5)
GFR calc Af Amer: 59 mL/min — ABNORMAL LOW (ref >90)
Glucose, Bld: 132 mg/dL — ABNORMAL HIGH (ref 70–99)
Sodium: 139 mEq/L (ref 135–145)
Total Protein: 6.5 g/dL (ref 6.0–8.3)

## 2012-02-08 LAB — VITAMIN B12: Vitamin B-12: 298 pg/mL (ref 211–911)

## 2012-02-08 LAB — TSH: TSH: 0.682 u[IU]/mL (ref 0.350–4.500)

## 2012-02-08 LAB — URINE MICROSCOPIC-ADD ON

## 2012-02-08 LAB — HEMOGLOBIN A1C
Hgb A1c MFr Bld: 6.4 % — ABNORMAL HIGH (ref <5.7)
Mean Plasma Glucose: 137 mg/dL — ABNORMAL HIGH (ref <117)

## 2012-02-08 LAB — SODIUM, URINE, RANDOM: Sodium, Ur: 40 mEq/L

## 2012-02-08 LAB — MRSA PCR SCREENING: MRSA by PCR: NEGATIVE

## 2012-02-08 LAB — FOLATE: Folate: 7.5 ng/mL

## 2012-02-08 LAB — RETICULOCYTES
RBC.: 2.52 MIL/uL — ABNORMAL LOW (ref 4.22–5.81)
Retic Count, Absolute: 30.2 10*3/uL (ref 19.0–186.0)

## 2012-02-08 LAB — PREALBUMIN: Prealbumin: 10.1 mg/dL — ABNORMAL LOW (ref 17.0–34.0)

## 2012-02-08 LAB — IRON AND TIBC: Iron: 39 ug/dL — ABNORMAL LOW (ref 42–135)

## 2012-02-08 LAB — TROPONIN I
Troponin I: <0.3 ng/mL (ref <0.30)
Troponin I: <0.3 ng/mL (ref <0.30)

## 2012-02-08 LAB — MAGNESIUM: Magnesium: 0.9 mg/dL — CL (ref 1.5–2.5)

## 2012-02-08 MED ORDER — ONDANSETRON HCL 4 MG/2ML IJ SOLN: 4.0000 mg | Freq: Four times a day (QID) | INTRAMUSCULAR | Status: DC | PRN

## 2012-02-08 MED ORDER — SODIUM CHLORIDE 0.9 % IV SOLN
1.0000 g | Freq: Once | INTRAVENOUS | Status: AC
  Administered 2012-02-08: 1 g via INTRAVENOUS
  Filled 2012-02-08: qty 10

## 2012-02-08 MED ORDER — OLANZAPINE-FLUOXETINE HCL 6-25 MG PO CAPS
1.0000 | ORAL_CAPSULE | Freq: Every day | ORAL | Status: DC
  Administered 2012-02-08 – 2012-02-17 (×9): 1 via ORAL
  Filled 2012-02-08 (×12): qty 1

## 2012-02-08 MED ORDER — ACETAMINOPHEN 650 MG RE SUPP
650.0000 mg | Freq: Four times a day (QID) | RECTAL | Status: DC | PRN
  Administered 2012-02-11: 650 mg via RECTAL
  Filled 2012-02-08: qty 1

## 2012-02-08 MED ORDER — MAGNESIUM SULFATE 40 MG/ML IJ SOLN
4.0000 g | Freq: Once | INTRAMUSCULAR | Status: AC
  Administered 2012-02-08: 4 g via INTRAVENOUS
  Filled 2012-02-08: qty 100

## 2012-02-08 MED ORDER — PANTOPRAZOLE SODIUM 40 MG PO TBEC
40.0000 mg | DELAYED_RELEASE_TABLET | Freq: Every day | ORAL | Status: DC
  Administered 2012-02-08 – 2012-02-17 (×9): 40 mg via ORAL
  Filled 2012-02-08 (×9): qty 1

## 2012-02-08 MED ORDER — SODIUM CHLORIDE 0.9 % IJ SOLN
3.0000 mL | Freq: Two times a day (BID) | INTRAMUSCULAR | Status: DC
  Administered 2012-02-08 – 2012-02-16 (×9): 3 mL via INTRAVENOUS

## 2012-02-08 MED ORDER — ALUM & MAG HYDROXIDE-SIMETH 200-200-20 MG/5ML PO SUSP: 30.0000 mL | Freq: Four times a day (QID) | ORAL | Status: DC | PRN

## 2012-02-08 MED ORDER — HYDROCODONE-ACETAMINOPHEN 5-325 MG PO TABS
1.0000 | ORAL_TABLET | ORAL | Status: DC | PRN
  Administered 2012-02-18: 1 via ORAL
  Filled 2012-02-08: qty 1

## 2012-02-08 MED ORDER — GUAIFENESIN-DM 100-10 MG/5ML PO SYRP
5.0000 mL | ORAL_SOLUTION | ORAL | Status: DC | PRN
  Filled 2012-02-08: qty 5

## 2012-02-08 MED ORDER — INSULIN ASPART 100 UNIT/ML ~~LOC~~ SOLN
0.0000 [IU] | SUBCUTANEOUS | Status: DC
  Administered 2012-02-08: 1 [IU] via SUBCUTANEOUS
  Administered 2012-02-08: 3 [IU] via SUBCUTANEOUS
  Administered 2012-02-08 – 2012-02-09 (×2): 1 [IU] via SUBCUTANEOUS

## 2012-02-08 MED ORDER — ONDANSETRON HCL 4 MG PO TABS: 4.0000 mg | ORAL_TABLET | Freq: Four times a day (QID) | ORAL | Status: DC | PRN

## 2012-02-08 MED ORDER — PHYTONADIONE 5 MG PO TABS
5.0000 mg | ORAL_TABLET | Freq: Once | ORAL | Status: AC
  Administered 2012-02-08: 5 mg via ORAL
  Filled 2012-02-08: qty 1

## 2012-02-08 MED ORDER — ATORVASTATIN CALCIUM 40 MG PO TABS
40.0000 mg | ORAL_TABLET | Freq: Every day | ORAL | Status: DC
  Administered 2012-02-08 – 2012-02-17 (×9): 40 mg via ORAL
  Filled 2012-02-08 (×11): qty 1

## 2012-02-08 MED ORDER — ALBUTEROL SULFATE (5 MG/ML) 0.5% IN NEBU
2.5000 mg | INHALATION_SOLUTION | RESPIRATORY_TRACT | Status: DC | PRN
  Administered 2012-02-11: 2.5 mg via RESPIRATORY_TRACT
  Filled 2012-02-08: qty 0.5

## 2012-02-08 MED ORDER — ACETAMINOPHEN 325 MG PO TABS: 650.0000 mg | ORAL_TABLET | Freq: Four times a day (QID) | ORAL | Status: DC | PRN

## 2012-02-08 MED ORDER — OLANZAPINE 5 MG PO TABS
5.0000 mg | ORAL_TABLET | Freq: Every day | ORAL | Status: DC
  Administered 2012-02-08 – 2012-02-17 (×9): 5 mg via ORAL
  Filled 2012-02-08 (×11): qty 1

## 2012-02-08 NOTE — Progress Notes (Signed)
ANTICOAGULATION CONSULT NOTE - Initial Consult  Pharmacy Consult for Coumadin Indication: atrial fibrillation  Allergies  Allergen Reactions  . Levaquin (Levofloxacin) Rash  . Codeine Nausea And Vomiting    Noted on MAR  . Penicillins Rash    Noted on [facility]    Patient Measurements: Height: 5\' 9"  (175.3 cm) Weight: 121 lb 4.1 oz (55 kg) IBW/kg (Calculated) : 70.7   Vital Signs: Temp: 98.5 F (36.9 C) (08/31 1157) Temp src: Oral (08/31 1157) BP: 128/47 mmHg (08/31 1157) Pulse Rate: 42  (08/31 1157)  Labs:  Basename 02/08/12 0824 02/08/12 0350 02/08/12 0330 02/07/12 1933  HGB -- 7.2* -- 7.5*  HCT -- 22.4* -- 22.8*  PLT -- 262 -- 273  APTT -- -- -- --  LABPROT -- >90.0* -- --  INR -- >10.00* -- --  HEPARINUNFRC -- -- -- --  CREATININE -- 1.30 -- 1.42*  CKTOTAL -- -- -- --  CKMB -- -- -- --  TROPONINI <0.30 -- <0.30 --    Estimated Creatinine Clearance: 37 ml/min (by C-G formula based on Cr of 1.3).   Medical History: Past Medical History  Diagnosis Date  . A-fib   . CAD (coronary artery disease)   . Hyperlipidemia   . Diabetes mellitus   . Hypertension   . Suprapubic catheter   . Peripheral vascular disease   . Arthritis   . Cancer     prostate cancer  . Myocardial infarction     Medications:  Prescriptions prior to admission  Medication Sig Dispense Refill  . atorvastatin (LIPITOR) 40 MG tablet Take 40 mg by mouth at bedtime.      . digoxin (LANOXIN) 0.125 MG tablet Take 125 mcg by mouth daily. Hold if HR <60      . furosemide (LASIX) 20 MG tablet Take 20 mg by mouth 2 (two) times daily.      . metFORMIN (GLUCOPHAGE) 500 MG tablet Take 500 mg by mouth 2 (two) times daily.      . metoprolol succinate (TOPROL-XL) 50 MG 24 hr tablet Take 100 mg by mouth 2 (two) times daily. Take with or immediately following a meal.      . OLANZapine (ZYPREXA) 5 MG tablet Take 5 mg by mouth at bedtime.      Marland Kitchen olanzapine-FLUoxetine (SYMBYAX) 6-25 MG per capsule Take 1  capsule by mouth at bedtime.      . Omega-3 Fatty Acids (FISH OIL) 1200 MG CAPS Take 1 capsule by mouth daily.      Marland Kitchen omeprazole (PRILOSEC) 20 MG capsule Take 20 mg by mouth daily.      Marland Kitchen warfarin (COUMADIN) 3 MG tablet Take 1.5-3 mg by mouth See admin instructions. Take 1.5mg  on Tues & Fri & 3mg  on all other days        Assessment: 78 yo M admitted 8/30 with diarrhea for the last month, becoming more severe over that last week. Pt has reported weight loss, weakness, dehydration, and hypotension related to the diarrhea.  Diarrhea now appears resolved.  Still awaiting some stool studies to identify source.    To restart Coumadin to Afib.  INR 3.6 on 8/27, INR >10 today.  Last Coumadin dose was 8/29.  MD has already ordered 5mg  PO Vit K to aid with INR reversal.  Elevated INR likely a reflection of dehydration and severe diarrhea.  No bleeding noted.    Goal of Therapy:  INR 2-3   Plan:  No Coumadin tonight. Daily INR.  Cala Bradford  Alison Kubicki, Pharm.D., BCPS Clinical Pharmacist Pager [phone] 02/08/2012 1:58 PM

## 2012-02-08 NOTE — Progress Notes (Addendum)
INITIAL ADULT NUTRITION ASSESSMENT Date: 02/08/2012   Time: 9:13 AM Reason for Assessment: Consult, nutrition risk   INTERVENTION: Recommend SLP evaluation r/t pt reports trouble swallowing. Magic Cup BID and snacks per pt preference. RD to monitor intake.  Pt meets criteria for severe PCM of acute illness AEB 20% weight loss in the past month per pt/family report and pt with moderate muscle wasting and subcutaneous fat loss with worsening weakness.  ASSESSMENT: Male 78 y.o.  Dx: Diarrhea, weakness  Food/Nutrition Related Hx: Pt admitted with diarrhea for the past month since last hospitalization and 2 days of hypotension. Pt with right BKA. Since his amputation MD noted pt has had failure to thrive with 25 pound unintended weight loss with 4-5 episodes of diarrhea daily. Pt with difficulty swallowing and coughing and gagging after food. Pt reports poor appetite for the past 2 months and states that at mealtimes he will take a few bites of his food and feel full. Pt's wife states she tried to give him Ensure at home but it made the diarrhea worse. Pt has not had any diarrhea since admission. Pt states his bottom set of dentures don't fit r/t weight loss which can make chewing difficult. Pt working on lunch during visit and was having coughing after every few bites which wife states he has been doing for a long time. Awaiting C. Difficile results.   Hx:  Past Medical History  Diagnosis Date  . A-fib   . CAD (coronary artery disease)   . Hyperlipidemia   . Diabetes mellitus   . Hypertension   . Suprapubic catheter   . Peripheral vascular disease   . Arthritis   . Cancer     prostate cancer  . Myocardial infarction    Related Meds:  Scheduled Meds:   . atorvastatin  40 mg Oral QHS  . calcium gluconate  1 g Intravenous Once  . insulin aspart  0-9 Units Subcutaneous Q4H  . magnesium sulfate 1 - 4 g bolus IVPB  4 g Intravenous Once  . OLANZapine  5 mg Oral QHS  . olanzapine-FLUoxetine   1 capsule Oral QHS  . pantoprazole  40 mg Oral Q1200  . phytonadione  5 mg Oral Once  . sodium chloride  3 mL Intravenous Q12H   Continuous Infusions:   . sodium chloride 100 mL/hr at 02/08/12 1243   PRN Meds:.acetaminophen, acetaminophen, albuterol, alum & mag hydroxide-simeth, guaiFENesin-dextromethorphan, HYDROcodone-acetaminophen, ondansetron (ZOFRAN) IV, ondansetron  Ht: 5\' 9"  (175.3 cm)  Wt: 121 lb 4.1 oz (55 kg) with right BKA  Ideal Wt: 150 lb with right BKA % Ideal Wt: 80  Usual Wt: 152 lb per pt/family report  % Usual Wt: 79  Body mass index is 17.91 kg/(m^2). Underweight   Labs:  CMP     Component Value Date/Time   NA 139 02/08/2012 0350   K 4.3 02/08/2012 0350   CL 108 02/08/2012 0350   CO2 18* 02/08/2012 0350   GLUCOSE 132* 02/08/2012 0350   BUN 36* 02/08/2012 0350   CREATININE 1.30 02/08/2012 0350   CREATININE 0.96 04/03/2011 0857   CALCIUM 6.6* 02/08/2012 0350   PROT 6.5 02/08/2012 0350   ALBUMIN 1.7* 02/08/2012 0350   AST 11 02/08/2012 0350   ALT 7 02/08/2012 0350   ALKPHOS 78 02/08/2012 0350   BILITOT 0.2* 02/08/2012 0350   GFRNONAA 51* 02/08/2012 0350   GFRAA 59* 02/08/2012 0350   Lab Results  Component Value Date   HGBA1C 6.9 09/16/2011  CBG (last 3)   Basename 02/08/12 1156 02/08/12 0830 02/08/12 0536  GLUCAP 141* 113* 98    Intake/Output Summary (Last 24 hours) at 02/08/12 1338 Last data filed at 02/08/12 1200  Gross per 24 hour  Intake   5233 ml  Output    800 ml  Net   4433 ml   Last BM - 8/30  Diet Order: Carb Control   IVF:    sodium chloride Last Rate: 100 mL/hr (02/07/12 2356)    Estimated Nutritional Needs:   Kcal: 4098-1191 Protein: 85-100 Fluid: 1.9-2.2L  NUTRITION DIAGNOSIS: -Inadequate oral intake (NI-2.1).  Status: Ongoing  RELATED TO: early satiety, problems chewing/swallowing  AS EVIDENCE BY: pt and family statement, unintended weight loss  MONITORING/EVALUATION(Goals): Pt able to safely consume >90% of  meals/supplements.   EDUCATION NEEDS: - Education needs addressed - encouraged small frequent meals/snacks of high calorie/high protein foods to improve nutritional status.     Dietitian #: [phone]  DOCUMENTATION CODES Per approved criteria  -Severe malnutrition in the context of acute illness or injury -Underweight    Marshall Cork 02/08/2012, 9:13 AM

## 2012-02-08 NOTE — ED Provider Notes (Signed)
I saw and evaluated the patient, reviewed the resident's note and I agree with the findings and plan.   .Face to face Exam:  General:  Awake HEENT:  Atraumatic Resp:  Normal effort Abd:  Nondistended Neuro:No focal weakness Lymph: No adenopathy   Nelia Shi, MD 02/08/12 [phone]

## 2012-02-08 NOTE — Progress Notes (Signed)
[facility]             Progress Note   Subjective: Feels better, no further diarrhea since last pm, breathing ok, ate breakfast  Objective: Vital signs in last 24 hours: Temp:  [96.6 F (35.9 C)-98.3 F (36.8 C)] 98.3 F (36.8 C) (08/31 0832) Pulse Rate:  [39-85] 39  (08/31 0832) Resp:  [12-21] 18  (08/31 0832) BP: (83-121)/(36-67) 88/49 mmHg (08/31 0832) SpO2:  [96 %-100 %] 96 % (08/31 0832) Weight:  [55 kg (121 lb 4.1 oz)] 55 kg (121 lb 4.1 oz) (08/31 0234) Weight change:  Last BM Date: 02/07/12  Intake/Output from previous day: 08/30 0701 - 08/31 0700 In: 4393 [P.O.:180; I.V.:4103; IV Piggyback:110] Out: 800 [Urine:800] Total I/O In: 640 [P.O.:240; I.V.:300; IV Piggyback:100] Out: -    Physical Exam: General: Alert, awake, oriented x3, in no acute distress. HEENT: No bruits, no goiter. Heart: Regular rate and rhythm, without murmurs, rubs, gallops. Lungs: Clear to auscultation bilaterally. Abdomen: Soft, nontender, nondistended, positive bowel sounds. Extremities: L BKA, small opening of sutures at medial end Neuro: Grossly intact, nonfocal.    Lab Results: Basic Metabolic Panel:  Basename 02/08/12 0350 02/07/12 1933  NA 139 133*  K 4.3 4.4  CL 108 103  CO2 18* 18*  GLUCOSE 132* 96  BUN 36* 41*  CREATININE 1.30 1.42*  CALCIUM 6.6* 6.5*  MG 0.9* --  PHOS 3.3 --   Liver Function Tests:  Basename 02/08/12 0350  AST 11  ALT 7  ALKPHOS 78  BILITOT 0.2*  PROT 6.5  ALBUMIN 1.7*   No results found for this basename: LIPASE:2,AMYLASE:2 in the last 72 hours No results found for this basename: AMMONIA:2 in the last 72 hours CBC:  Basename 02/08/12 0350 02/07/12 1933  WBC 10.7* 12.1*  NEUTROABS -- 7.0  HGB 7.2* 7.5*  HCT 22.4* 22.8*  MCV 88.9 87.0  PLT 262 273   Cardiac Enzymes:  Basename 02/08/12 0824 02/08/12 0330  CKTOTAL -- --  CKMB -- --  CKMBINDEX -- --  TROPONINI <0.30 <0.30   BNP: No results found for this basename:  PROBNP:3 in the last 72 hours D-Dimer: No results found for this basename: DDIMER:2 in the last 72 hours CBG:  Basename 02/08/12 0536  GLUCAP 98   Hemoglobin A1C: No results found for this basename: HGBA1C in the last 72 hours Fasting Lipid Panel: No results found for this basename: CHOL,HDL,LDLCALC,TRIG,CHOLHDL,LDLDIRECT in the last 72 hours Thyroid Function Tests: No results found for this basename: TSH,T4TOTAL,FREET4,T3FREE,THYROIDAB in the last 72 hours Anemia Panel:  Basename 02/08/12 0350  VITAMINB12 --  FOLATE --  FERRITIN --  TIBC --  IRON --  RETICCTPCT 1.2   Coagulation:  Basename 02/08/12 0350  LABPROT >90.0*  INR >10.00*   Urine Drug Screen: Drugs of Abuse  No results found for this basename: labopia, cocainscrnur, labbenz, amphetmu, thcu, labbarb    Alcohol Level: No results found for this basename: ETH:2 in the last 72 hours Urinalysis:  Basename 02/08/12 0415  COLORURINE YELLOW  LABSPEC 1.012  PHURINE 5.0  GLUCOSEU NEGATIVE  HGBUR MODERATE*  BILIRUBINUR NEGATIVE  KETONESUR NEGATIVE  PROTEINUR NEGATIVE  UROBILINOGEN 0.2  NITRITE POSITIVE*  LEUKOCYTESUR LARGE*   Recent Results (from the past 240 hour(s))  MRSA PCR SCREENING     Status: Normal   Collection Time   02/08/12  2:49 AM      Component Value Range Status Comment   MRSA by PCR NEGATIVE  NEGATIVE Final  Studies/Results: X-ray Chest Pa And Lateral   02/08/2012  *RADIOLOGY REPORT*  Clinical Data: Generalized fatigue.  No fever.  No cough for congestion.  CHEST - 2 VIEW  Comparison: 12/02/2011.  Findings: Borderline heart size for projection.  Pulmonary vascular congestion is present.  Basilar atelectasis.  Interstitial thickening is present at the lung bases.  Constellation of findings are compatible with mild CHF with interstitial pulmonary edema. Kerley B lines are present at the periphery of the bases.  Atypical infection considered unlikely.  The median sternotomy / CABG. Aortic  arch atherosclerosis. No focal consolidation.  No effusion.  IMPRESSION: Constellation of findings compatible with mild CHF with interstitial edema.   Original Report Authenticated By: Andreas Newport, M.D.     Medications: Scheduled Meds:   . atorvastatin  40 mg Oral QHS  . calcium gluconate  1 g Intravenous Once  . insulin aspart  0-9 Units Subcutaneous Q4H  . magnesium sulfate 1 - 4 g bolus IVPB  4 g Intravenous Once  . OLANZapine  5 mg Oral QHS  . olanzapine-FLUoxetine  1 capsule Oral QHS  . pantoprazole  40 mg Oral Q1200  . phytonadione  5 mg Oral Once  . sodium chloride  3 mL Intravenous Q12H   Continuous Infusions:   . sodium chloride 100 mL/hr (02/07/12 2356)   PRN Meds:.acetaminophen, acetaminophen, albuterol, alum & mag hydroxide-simeth, guaiFENesin-dextromethorphan, HYDROcodone-acetaminophen, ondansetron (ZOFRAN) IV, ondansetron  Assessment/Plan:  1. Diarrhea - seems to be resolving, possibly viral gastroenteritis appears to be  running its course continue IVF today Stool Cx negative FU C.difficile PCR   2. DIABETES MELLITUS, TYPE II -  sliding scale  3.. Atrial fibrillation - will hold metoprolol and digoxin given bradycardia, continue warfarin, per cards no further workup warranted  4 .Hypotension/ Dehydration - secondary to volume depletion, diarrhea, continue with IV fluids and monitor her urine output , BP has improved  5.Hypocalcemia - will replace   .Acute renal failure - - improved, continue IVF  .Debility - will have PT OT evaluation. He has home health. His family is weiry about nursing home placement secondary to bad experiences   .Anemia - will obtain anemia panel and type and screen Hemoccult stool , will likely transfuse , will look at records of colonoscopies  Prophylaxis:on coumadin Protonix   CODE STATUS: DNR/DNI as per patient and family wishes       LOS: 1 day   [facility] Pager: [phone] 02/08/2012, 11:11  AM

## 2012-02-08 NOTE — Progress Notes (Signed)
PT Cancellation Note  Treatment cancelled today due to medical issues with patient which prohibited therapy. Pts INR >10. Will attempt evaluation tomorrow pending medical stability.  Thanks, 02/08/2012 Milana Kidney DPT PAGER: [phone] OFFICE: [phone]    Milana Kidney 02/08/2012, 7:55 AM

## 2012-02-08 NOTE — ED Notes (Signed)
Dr Genevieve Norlander paged per Dr Adela Glimpse.

## 2012-02-08 NOTE — ED Notes (Signed)
Pt HR of 39.  Dr Adela Glimpse paged.

## 2012-02-08 NOTE — Progress Notes (Signed)
CRITICAL VALUE ALERT  Critical value received: INR >10.0 Date of notification:  02/08/12  Time of notification:  0452  Critical value read back:yes  Nurse who received alert:  Glenna Brunkow J. Garnie Borchardt RN MD notified (1st page):  Elray Mcgregor NP Time of first page:  [phone]  Responding MD: Elray Mcgregor NP  Time MD responded:  [phone]

## 2012-02-08 NOTE — Progress Notes (Signed)
Patient ID: [name], male   DOB: [birth year], 78 y.o.   MRN: [id number]    SUBJECTIVE: Diarrhea has resolved since patient has been in hospital.  He is still very weak and has lost considerable weight.  He is getting IV fluid, creatinine is improving.  HR in the 30s-40s with regularized atrial fibrillation suggesting heart block.  SBP 80s-100s.  He is not lightheaded.  He has profound anemia.      Marland Kitchen atorvastatin  40 mg Oral QHS  . calcium gluconate  1 g Intravenous Once  . insulin aspart  0-9 Units Subcutaneous Q4H  . magnesium sulfate 1 - 4 g bolus IVPB  4 g Intravenous Once  . OLANZapine  5 mg Oral QHS  . olanzapine-FLUoxetine  1 capsule Oral QHS  . pantoprazole  40 mg Oral Q1200  . phytonadione  5 mg Oral Once  . sodium chloride  3 mL Intravenous Q12H      Filed Vitals:   02/08/12 0300 02/08/12 0400 02/08/12 0500 02/08/12 0832  BP: 114/50 103/40 105/61 88/49  Pulse: 44 85 39 39  Temp: 98.2 F (36.8 C)   98.3 F (36.8 C)  TempSrc: Oral   Oral  Resp: 14 15 16 18   Height:      Weight:      SpO2: 100% 99% 98% 96%    Intake/Output Summary (Last 24 hours) at 02/08/12 1107 Last data filed at 02/08/12 1000  Gross per 24 hour  Intake   5033 ml  Output    800 ml  Net   4233 ml    LABS: Basic Metabolic Panel:  Basename 02/08/12 0350 02/07/12 1933  NA 139 133*  K 4.3 4.4  CL 108 103  CO2 18* 18*  GLUCOSE 132* 96  BUN 36* 41*  CREATININE 1.30 1.42*  CALCIUM 6.6* 6.5*  MG 0.9* --  PHOS 3.3 --   Liver Function Tests:  Basename 02/08/12 0350  AST 11  ALT 7  ALKPHOS 78  BILITOT 0.2*  PROT 6.5  ALBUMIN 1.7*   No results found for this basename: LIPASE:2,AMYLASE:2 in the last 72 hours CBC:  Basename 02/08/12 0350 02/07/12 1933  WBC 10.7* 12.1*  NEUTROABS -- 7.0  HGB 7.2* 7.5*  HCT 22.4* 22.8*  MCV 88.9 87.0  PLT 262 273   Cardiac Enzymes:  Basename 02/08/12 0824 02/08/12 0330  CKTOTAL -- --  CKMB -- --  CKMBINDEX -- --  TROPONINI <0.30 <0.30     BNP: No components found with this basename: POCBNP:3 D-Dimer: No results found for this basename: DDIMER:2 in the last 72 hours Hemoglobin A1C: No results found for this basename: HGBA1C in the last 72 hours Fasting Lipid Panel: No results found for this basename: CHOL,HDL,LDLCALC,TRIG,CHOLHDL,LDLDIRECT in the last 72 hours Thyroid Function Tests: No results found for this basename: TSH,T4TOTAL,FREET3,T3FREE,THYROIDAB in the last 72 hours Anemia Panel:  Basename 02/08/12 0350  VITAMINB12 --  FOLATE --  FERRITIN --  TIBC --  IRON --  RETICCTPCT 1.2    RADIOLOGY: X-ray Chest Pa And Lateral   02/08/2012  *RADIOLOGY REPORT*  Clinical Data: Generalized fatigue.  No fever.  No cough for congestion.  CHEST - 2 VIEW  Comparison: 12/02/2011.  Findings: Borderline heart size for projection.  Pulmonary vascular congestion is present.  Basilar atelectasis.  Interstitial thickening is present at the lung bases.  Constellation of findings are compatible with mild CHF with interstitial pulmonary edema. Kerley B lines are present at the periphery of the  bases.  Atypical infection considered unlikely.  The median sternotomy / CABG. Aortic arch atherosclerosis. No focal consolidation.  No effusion.  IMPRESSION: Constellation of findings compatible with mild CHF with interstitial edema.   Original Report Authenticated By: Andreas Newport, M.D.     PHYSICAL EXAM General: NAD Neck: JVP not elevated, no thyromegaly or thyroid nodule.  Lungs: Slight crackles at bases.  CV: Nondisplaced PMI.  Heart bradycardic, regular S1/S2, no S3/S4, no murmur.  No peripheral edema.  No carotid bruit.  Normal pedal pulses.  Abdomen: Soft, nontender, no hepatosplenomegaly, no distention.  Neurologic: Alert and oriented x 3.  Psych: Normal affect. Extremities: No clubbing or cyanosis.   TELEMETRY: Reviewed telemetry pt in with probable underlying atrial fibrillation with regularized rhythm (suggesting heart block),  in 30s while asleep and 40s after waking up  ASSESSMENT AND PLAN:  78 yo with history of CAD/CABG and atrial fibrillation with cardioembolic event requiring R BKA in 6/13 presented with diarrhea and dehydration, bradycardia, and anemia.  1. Diarrhea: ? C. Difficile.  He has lost considerable weight and was dehydrated at admission.  He is now getting NS at 100 cc/hr.  Creatinine improving.  Awaiting stool studies.  His diarrhea seems to have subsided.  2. Rhythm: Patient is in what appears to be regularized atrial fibrillation this morning, suggesting heart block.  Digoxin level yesterday was 1.8.  HR in 40s when he wakes up.  Mild hypotension.  No lightheadedness.  Continue to hold all nodal blocking agents.  Hopefully as digoxin wears out of system, rate will improve.   3. Atrial fibrillation: Cardioembolic event to right leg in 6/13.  Would like to keep him on coumadin if possible.  He was taken off coumadin at admission with low hemoglobin.   Marca Ancona 02/08/2012 11:15 AM

## 2012-02-08 NOTE — ED Notes (Signed)
PT denies pain at this time. Continues to be bradycardic in 40's.  Pt remains asymptomatic. Continues to make urine.

## 2012-02-08 NOTE — ED Notes (Signed)
Spoke with Dr Genevieve Norlander and he stated there is nothing to do for this pt at this time.

## 2012-02-08 NOTE — Progress Notes (Signed)
Dr. Jomarie Longs  Notified of patients low heart rate in the 30's and of the patients recent unintentional weight loss.

## 2012-02-09 DIAGNOSIS — D649 Anemia, unspecified: Secondary | ICD-10-CM

## 2012-02-09 LAB — BASIC METABOLIC PANEL
BUN: 23 mg/dL (ref 6–23)
Chloride: 111 mEq/L (ref 96–112)
GFR calc Af Amer: 70 mL/min — ABNORMAL LOW (ref >90)
Potassium: 4.6 mEq/L (ref 3.5–5.1)

## 2012-02-09 LAB — CBC
HCT: 20.8 % — ABNORMAL LOW (ref 39.0–52.0)
RDW: 17.5 % — ABNORMAL HIGH (ref 11.5–15.5)
WBC: 10.8 10*3/uL — ABNORMAL HIGH (ref 4.0–10.5)

## 2012-02-09 LAB — GLUCOSE, CAPILLARY
Glucose-Capillary: 102 mg/dL — ABNORMAL HIGH (ref 70–99)
Glucose-Capillary: 133 mg/dL — ABNORMAL HIGH (ref 70–99)
Glucose-Capillary: 127 mg/dL — ABNORMAL HIGH (ref 70–99)

## 2012-02-09 LAB — PREPARE RBC (CROSSMATCH)

## 2012-02-09 LAB — PROTIME-INR: Prothrombin Time: 51.5 seconds — ABNORMAL HIGH (ref 11.6–15.2)

## 2012-02-09 MED ORDER — INSULIN ASPART 100 UNIT/ML ~~LOC~~ SOLN
0.0000 [IU] | Freq: Three times a day (TID) | SUBCUTANEOUS | Status: DC
  Administered 2012-02-10: 1 [IU] via SUBCUTANEOUS
  Administered 2012-02-11 – 2012-02-13 (×2): 2 [IU] via SUBCUTANEOUS
  Administered 2012-02-13: 1 [IU] via SUBCUTANEOUS
  Administered 2012-02-14: 2 [IU] via SUBCUTANEOUS
  Administered 2012-02-14 – 2012-02-15 (×2): 1 [IU] via SUBCUTANEOUS
  Administered 2012-02-16 (×3): 2 [IU] via SUBCUTANEOUS
  Administered 2012-02-17: 3 [IU] via SUBCUTANEOUS
  Administered 2012-02-18 (×3): 2 [IU] via SUBCUTANEOUS
  Administered 2012-02-19: 1 [IU] via SUBCUTANEOUS
  Administered 2012-02-19: 2 [IU] via SUBCUTANEOUS

## 2012-02-09 MED ORDER — MAGNESIUM OXIDE 400 (241.3 MG) MG PO TABS
400.0000 mg | ORAL_TABLET | Freq: Every day | ORAL | Status: DC
  Administered 2012-02-09 – 2012-02-17 (×8): 400 mg via ORAL
  Filled 2012-02-09 (×11): qty 1

## 2012-02-09 NOTE — Progress Notes (Signed)
ANTICOAGULATION CONSULT NOTE - Follow Up Consult  Pharmacy Consult for Coumadin Indication: atrial fibrillation  Allergies  Allergen Reactions  . Levaquin (Levofloxacin) Rash  . Codeine Nausea And Vomiting    Noted on MAR  . Penicillins Rash    Noted on [facility]    Patient Measurements: Height: 5\' 9"  (175.3 cm) Weight: 121 lb 4.1 oz (55 kg) IBW/kg (Calculated) : 70.7   Vital Signs: Temp: 97.1 F (36.2 C) (09/01 1230) Temp src: Axillary (09/01 1230) BP: 143/67 mmHg (09/01 1230) Pulse Rate: 62  (09/01 1230)  Labs:  Basename 02/09/12 0520 02/08/12 1413 02/08/12 0824 02/08/12 0350 02/08/12 0330 02/07/12 1933  HGB 6.8* -- -- 7.2* -- --  HCT 20.8* -- -- 22.4* -- 22.8*  PLT 240 -- -- 262 -- 273  APTT -- -- -- -- -- --  LABPROT 51.5* -- -- >90.0* -- --  INR 5.60* -- -- >10.00* -- --  HEPARINUNFRC -- -- -- -- -- --  CREATININE 1.14 -- -- 1.30 -- 1.42*  CKTOTAL -- -- -- -- -- --  CKMB -- -- -- -- -- --  TROPONINI -- <0.30 <0.30 -- <0.30 --    Estimated Creatinine Clearance: 42.2 ml/min (by C-G formula based on Cr of 1.14).   Medications:  Scheduled:    . atorvastatin  40 mg Oral QHS  . insulin aspart  0-9 Units Subcutaneous Q4H  . magnesium oxide  400 mg Oral Daily  . OLANZapine  5 mg Oral QHS  . olanzapine-FLUoxetine  1 capsule Oral QHS  . pantoprazole  40 mg Oral Q1200  . sodium chloride  3 mL Intravenous Q12H    Assessment: 78 yo M admitted 8/30 with diarrhea for the last month, becoming more severe over that last week. Pt has reported weight loss, weakness, dehydration, and hypotension related to the diarrhea. Diarrhea now appears resolved. Still awaiting some stool studies to identify source.   To restart Coumadin to Afib.  INR remains elevated, however, has trended down to 5.6 after Vit K yesterday.  Hgb 6.8 today and pt receiving blood transfusion today.  No bleeding noted.  Goal of Therapy:  INR 2-3   Plan:  No Coumadin tonight.  Daily INR.  [place], Pharm.D., BCPS Clinical Pharmacist Pager [phone] 02/09/2012 1:21 PM

## 2012-02-09 NOTE — Progress Notes (Signed)
[facility]             Progress Note   Subjective: Feels better, no further diarrhea since admission,  breathing ok, ate breakfast  Objective: Vital signs in last 24 hours: Temp:  [96.4 F (35.8 C)-98.5 F (36.9 C)] 96.4 F (35.8 C) (09/01 0803) Pulse Rate:  [21-61] 61  (09/01 0803) Resp:  [15-22] 22  (09/01 0803) BP: (98-144)/(45-63) 144/63 mmHg (09/01 0803) SpO2:  [93 %-99 %] 93 % (09/01 0700) Weight change:  Last BM Date: 02/07/12  Intake/Output from previous day: 08/31 0701 - 09/01 0700 In: 2703 [P.O.:500; I.V.:2103; IV Piggyback:100] Out: 650 [Urine:650] Total I/O In: 612.5 [I.V.:250; Blood:362.5] Out: -    Physical Exam: General: Alert, awake, oriented x3, in no acute distress. HEENT: No bruits, no goiter. Heart: Regular rate and rhythm, without murmurs, rubs, gallops. Lungs: faint basilar crackles Abdomen: Soft, nontender, nondistended, positive bowel sounds. Extremities: L BKA, small opening of sutures at medial end Neuro: Grossly intact, nonfocal.    Lab Results: Basic Metabolic Panel:  Basename 02/09/12 0520 02/08/12 0350  NA 139 139  K 4.6 4.3  CL 111 108  CO2 21 18*  GLUCOSE 94 132*  BUN 23 36*  CREATININE 1.14 1.30  CALCIUM 6.8* 6.6*  MG -- 0.9*  PHOS -- 3.3   Liver Function Tests:  Basename 02/08/12 0350  AST 11  ALT 7  ALKPHOS 78  BILITOT 0.2*  PROT 6.5  ALBUMIN 1.7*   No results found for this basename: LIPASE:2,AMYLASE:2 in the last 72 hours No results found for this basename: AMMONIA:2 in the last 72 hours CBC:  Basename 02/09/12 0520 02/08/12 0350 02/07/12 1933  WBC 10.8* 10.7* --  NEUTROABS -- -- 7.0  HGB 6.8* 7.2* --  HCT 20.8* 22.4* --  MCV 87.8 88.9 --  PLT 240 262 --   Cardiac Enzymes:  Basename 02/08/12 1413 02/08/12 0824 02/08/12 0330  CKTOTAL -- -- --  CKMB -- -- --  CKMBINDEX -- -- --  TROPONINI <0.30 <0.30 <0.30   BNP: No results found for this basename: PROBNP:3 in the last 72  hours D-Dimer: No results found for this basename: DDIMER:2 in the last 72 hours CBG:  Basename 02/09/12 0749 02/09/12 0419 02/09/12 0015 02/08/12 2032 02/08/12 1559 02/08/12 1156  GLUCAP 102* 90 82 207* 123* 141*   Hemoglobin A1C:  Basename 02/08/12 0350  HGBA1C 6.4*   Fasting Lipid Panel: No results found for this basename: CHOL,HDL,LDLCALC,TRIG,CHOLHDL,LDLDIRECT in the last 72 hours Thyroid Function Tests:  Basename 02/08/12 0350  TSH 0.682  T4TOTAL --  FREET4 --  T3FREE --  THYROIDAB --   Anemia Panel:  Basename 02/08/12 0350  VITAMINB12 298  FOLATE 7.5  FERRITIN 1290*  TIBC 179*  IRON 39*  RETICCTPCT 1.2   Coagulation:  Basename 02/09/12 0520 02/08/12 0350  LABPROT 51.5* >90.0*  INR 5.60* >10.00*   Urine Drug Screen: Drugs of Abuse  No results found for this basename: labopia,  cocainscrnur,  labbenz,  amphetmu,  thcu,  labbarb    Alcohol Level: No results found for this basename: ETH:2 in the last 72 hours Urinalysis:  Basename 02/08/12 0415  COLORURINE YELLOW  LABSPEC 1.012  PHURINE 5.0  GLUCOSEU NEGATIVE  HGBUR MODERATE*  BILIRUBINUR NEGATIVE  KETONESUR NEGATIVE  PROTEINUR NEGATIVE  UROBILINOGEN 0.2  NITRITE POSITIVE*  LEUKOCYTESUR LARGE*   Recent Results (from the past 240 hour(s))  MRSA PCR SCREENING     Status: Normal   Collection Time  02/08/12  2:49 AM      Component Value Range Status Comment   MRSA by PCR NEGATIVE  NEGATIVE Final     Studies/Results: X-ray Chest Pa And Lateral   02/08/2012  *RADIOLOGY REPORT*  Clinical Data: Generalized fatigue.  No fever.  No cough for congestion.  CHEST - 2 VIEW  Comparison: 12/02/2011.  Findings: Borderline heart size for projection.  Pulmonary vascular congestion is present.  Basilar atelectasis.  Interstitial thickening is present at the lung bases.  Constellation of findings are compatible with mild CHF with interstitial pulmonary edema. Kerley B lines are present at the periphery of the  bases.  Atypical infection considered unlikely.  The median sternotomy / CABG. Aortic arch atherosclerosis. No focal consolidation.  No effusion.  IMPRESSION: Constellation of findings compatible with mild CHF with interstitial edema.   Original Report Authenticated By: Andreas Newport, M.D.     Medications: Scheduled Meds:    . atorvastatin  40 mg Oral QHS  . insulin aspart  0-9 Units Subcutaneous Q4H  . magnesium oxide  400 mg Oral Daily  . OLANZapine  5 mg Oral QHS  . olanzapine-FLUoxetine  1 capsule Oral QHS  . pantoprazole  40 mg Oral Q1200  . sodium chloride  3 mL Intravenous Q12H   Continuous Infusions:    . DISCONTD: sodium chloride 1,000 mL (02/08/12 2246)   PRN Meds:.acetaminophen, acetaminophen, albuterol, alum & mag hydroxide-simeth, guaiFENesin-dextromethorphan, HYDROcodone-acetaminophen, ondansetron (ZOFRAN) IV, ondansetron  Assessment/Plan:  1. Diarrhea - seems to be resolving, possibly viral gastroenteritis appears to be  running its course DC IVF Stool Cx negative FU C.difficile PCR -still pending since no BM   2. DIABETES MELLITUS, TYPE II -  sliding scale  3.. Atrial fibrillation - will hold metoprolol and digoxin given bradycardia, HR better as digoxin gets out of system, hold warfarin  4 .Hypotension/ Dehydration - secondary to volume depletion, diarrhea, improved, DC IV fluids and monitor her urine output  . Anemia-acute on chronic - anemia panel unremarkable, suspect chronic disease and worsened by acute illness, last EGD/Colonoscopy and capsule endoscopy 2 years ago, unable to find source of blood loss per wife Transfuse 2units PRBC today, FU with hematology as outpatient No overt blood loss  5.Hypocalcemia - will replace   6.Acute renal failure - - improved, stop IVF  7.Debility - will have PT OT evaluation. He has home health. His family is weiry about nursing home placement secondary to bad experiences   Prophylaxis:on coumadin Protonix   CODE  STATUS: DNR/DNI as per patient and family wishes  Ambulate, PT eval Transfer to tele     LOS: 2 days   [facility] Pager: [phone] 02/09/2012, 9:06 AM

## 2012-02-09 NOTE — Progress Notes (Signed)
Patient ID: [name], male   DOB: 04/06/1932, 78 y.o.   MRN: [id number]     SUBJECTIVE: Diarrhea has resolved since patient has been in hospital.  He is still very weak and has lost considerable weight.  HR improved off nodal blockers, now running in the 50s, atrial fibrillation.  BP improved.  Hemoglobin lower today, getting pRBCs.  INR was > 10 yesterday, got vitamin K and now 5.6.  No overt bleeding.     Marland Kitchen atorvastatin  40 mg Oral QHS  . insulin aspart  0-9 Units Subcutaneous Q4H  . magnesium oxide  400 mg Oral Daily  . OLANZapine  5 mg Oral QHS  . olanzapine-FLUoxetine  1 capsule Oral QHS  . pantoprazole  40 mg Oral Q1200  . sodium chloride  3 mL Intravenous Q12H      Filed Vitals:   02/09/12 0749 02/09/12 0800 02/09/12 0803 02/09/12 0900  BP:  131/54 144/63 109/50  Pulse: 56 55 61 56  Temp:   96.4 F (35.8 C) 98.4 F (36.9 C)  TempSrc:   Axillary Oral  Resp: 20 18 22 24   Height:      Weight:      SpO2:  96%  98%    Intake/Output Summary (Last 24 hours) at 02/09/12 0953 Last data filed at 02/09/12 0900  Gross per 24 hour  Intake 2975.5 ml  Output    950 ml  Net 2025.5 ml    LABS: Basic Metabolic Panel:  Basename 02/09/12 0520 02/08/12 0350  NA 139 139  K 4.6 4.3  CL 111 108  CO2 21 18*  GLUCOSE 94 132*  BUN 23 36*  CREATININE 1.14 1.30  CALCIUM 6.8* 6.6*  MG -- 0.9*  PHOS -- 3.3   Liver Function Tests:  Basename 02/08/12 0350  AST 11  ALT 7  ALKPHOS 78  BILITOT 0.2*  PROT 6.5  ALBUMIN 1.7*   No results found for this basename: LIPASE:2,AMYLASE:2 in the last 72 hours CBC:  Basename 02/09/12 0520 02/08/12 0350 02/07/12 1933  WBC 10.8* 10.7* --  NEUTROABS -- -- 7.0  HGB 6.8* 7.2* --  HCT 20.8* 22.4* --  MCV 87.8 88.9 --  PLT 240 262 --   Cardiac Enzymes:  Basename 02/08/12 1413 02/08/12 0824 02/08/12 0330  CKTOTAL -- -- --  CKMB -- -- --  CKMBINDEX -- -- --  TROPONINI <0.30 <0.30 <0.30   BNP: No components found with this  basename: POCBNP:3 D-Dimer: No results found for this basename: DDIMER:2 in the last 72 hours Hemoglobin A1C:  Basename 02/08/12 0350  HGBA1C 6.4*   Fasting Lipid Panel: No results found for this basename: CHOL,HDL,LDLCALC,TRIG,CHOLHDL,LDLDIRECT in the last 72 hours Thyroid Function Tests:  Basename 02/08/12 0350  TSH 0.682  T4TOTAL --  T3FREE --  THYROIDAB --   Anemia Panel:  Basename 02/08/12 0350  VITAMINB12 298  FOLATE 7.5  FERRITIN 1290*  TIBC 179*  IRON 39*  RETICCTPCT 1.2    RADIOLOGY: X-ray Chest Pa And Lateral   02/08/2012  *RADIOLOGY REPORT*  Clinical Data: Generalized fatigue.  No fever.  No cough for congestion.  CHEST - 2 VIEW  Comparison: 12/02/2011.  Findings: Borderline heart size for projection.  Pulmonary vascular congestion is present.  Basilar atelectasis.  Interstitial thickening is present at the lung bases.  Constellation of findings are compatible with mild CHF with interstitial pulmonary edema. Kerley B lines are present at the periphery of the bases.  Atypical infection considered unlikely.  The median sternotomy / CABG. Aortic arch atherosclerosis. No focal consolidation.  No effusion.  IMPRESSION: Constellation of findings compatible with mild CHF with interstitial edema.   Original Report Authenticated By: Andreas Newport, M.D.     PHYSICAL EXAM General: NAD Neck: JVP 8-9 cm, no thyromegaly or thyroid nodule.  Lungs: Slight crackles at bases.  CV: Nondisplaced PMI.  Heart irregular S1/S2, no S3/S4, 1/6 HSM.  No peripheral edema.  No carotid bruit.  Normal pedal pulses.  Abdomen: Soft, nontender, no hepatosplenomegaly, no distention.  Neurologic: Alert and oriented x 3.  Psych: Normal affect. Extremities: No clubbing or cyanosis.   TELEMETRY: Patient is in atrial fibrillation in the 50s.   ASSESSMENT AND PLAN:  78 yo with history of CAD/CABG and atrial fibrillation with cardioembolic event requiring R BKA in 6/13 presented with diarrhea and  dehydration, bradycardia, and anemia.  1. Diarrhea: Suspect viral gastroenteritis.  Now resolved and no longer dehydrated.  Creatinine back to baseline.  2. Rhythm: Patient was initially bradycardic with what appeared to be regularized atrial fibrillation, suggesting heart block.  Digoxin level was 1.8.  He is now off nodal blocking agents and HR in the 50s, atrial fibrillation.  3. Atrial fibrillation: Cardioembolic event to right leg in 6/13.  Would like to keep him on coumadin if possible.  Holding for now with supratherapeutic INR.  4. Anemia: In setting of viral gastroenteritis and supratherapeutic INR (high INR likely due to poor po intake with gastroenteritis).  No overt bleeding.  He is getting pRBCs today and has had vitamin K.  Hopefully can eventually go back on coumadin at some point after he stabilizes.  5. Diastolic CHF: JVP mildly elevated.  No further IV fluids as he is taking po.  No dyspnea.   Marca Ancona 02/09/2012 9:53 AM

## 2012-02-09 NOTE — Progress Notes (Signed)
PT Cancellation Note  Treatment cancelled today due to medical issues with patient which prohibited therapy. Spoke with RN regarding INR at 5.6. Pt currently receiving blood transfusion. RN suggested to wait until tomorrow to complete evaluation. Will evaluate tomorrow pending medical stability.   02/09/2012 [name] DPT PAGER: [phone] OFFICE: [phone]    [name] 02/09/2012, 10:25 AM

## 2012-02-10 DIAGNOSIS — I4891 Unspecified atrial fibrillation: Secondary | ICD-10-CM

## 2012-02-10 LAB — PROTIME-INR: Prothrombin Time: 45.8 seconds — ABNORMAL HIGH (ref 11.6–15.2)

## 2012-02-10 LAB — BASIC METABOLIC PANEL
CO2: 20 mEq/L (ref 19–32)
Calcium: 7.3 mg/dL — ABNORMAL LOW (ref 8.4–10.5)
Chloride: 108 mEq/L (ref 96–112)
Creatinine, Ser: 1.08 mg/dL (ref 0.50–1.35)
GFR calc Af Amer: 74 mL/min — ABNORMAL LOW (ref >90)
Sodium: 137 mEq/L (ref 135–145)

## 2012-02-10 LAB — TYPE AND SCREEN
ABO/RH(D): O POS
Antibody Screen: NEGATIVE
Unit division: 0

## 2012-02-10 LAB — URINE CULTURE: Colony Count: >=100000

## 2012-02-10 LAB — CBC
MCH: 27.7 pg (ref 26.0–34.0)
Platelets: 223 10*3/uL (ref 150–400)
RBC: 3.36 MIL/uL — ABNORMAL LOW (ref 4.22–5.81)
RDW: 17.8 % — ABNORMAL HIGH (ref 11.5–15.5)
WBC: 14.1 10*3/uL — ABNORMAL HIGH (ref 4.0–10.5)

## 2012-02-10 LAB — GLUCOSE, CAPILLARY
Glucose-Capillary: 132 mg/dL — ABNORMAL HIGH (ref 70–99)
Glucose-Capillary: 106 mg/dL — ABNORMAL HIGH (ref 70–99)

## 2012-02-10 NOTE — Progress Notes (Signed)
ANTICOAGULATION CONSULT NOTE - Follow Up Consult  Pharmacy Consult for Coumadin Indication: atrial fibrillation  Allergies  Allergen Reactions  . Levaquin (Levofloxacin) Rash  . Codeine Nausea And Vomiting    Noted on MAR  . Penicillins Rash    Noted on [facility]    Patient Measurements: Height: 5\' 9"  (175.3 cm) Weight: 125 lb 8 oz (56.926 kg) IBW/kg (Calculated) : 70.7   Vital Signs: Temp: 98.7 F (37.1 C) (09/02 0600) BP: 99/62 mmHg (09/02 0600) Pulse Rate: 66  (09/02 0600)  Labs:  Basename 02/10/12 0550 02/09/12 0520 02/08/12 1413 02/08/12 0824 02/08/12 0350 02/08/12 0330  HGB 9.3* 6.8* -- -- -- --  HCT 28.7* 20.8* -- -- 22.4* --  PLT 223 240 -- -- 262 --  APTT -- -- -- -- -- --  LABPROT 45.8* 51.5* -- -- >90.0* --  INR 4.82* 5.60* -- -- >10.00* --  HEPARINUNFRC -- -- -- -- -- --  CREATININE 1.08 1.14 -- -- 1.30 --  CKTOTAL -- -- -- -- -- --  CKMB -- -- -- -- -- --  TROPONINI -- -- <0.30 <0.30 -- <0.30    Estimated Creatinine Clearance: 46.1 ml/min (by C-G formula based on Cr of 1.08).   Medications:  Scheduled:     . atorvastatin  40 mg Oral QHS  . insulin aspart  0-9 Units Subcutaneous TID WC & HS  . magnesium oxide  400 mg Oral Daily  . OLANZapine  5 mg Oral QHS  . olanzapine-FLUoxetine  1 capsule Oral QHS  . pantoprazole  40 mg Oral Q1200  . sodium chloride  3 mL Intravenous Q12H  . DISCONTD: insulin aspart  0-9 Units Subcutaneous Q4H    Assessment: 78 yo M admitted 8/30 with diarrhea for the last month, becoming more severe over that last week. Pt has reported weight loss, weakness, dehydration, and hypotension related to the diarrhea. Diarrhea now appears resolved.   To restart Coumadin to Afib.  INR remains elevated, however, has trended down to 4.8 after Vit K on 8/31. Patient transfused yesterday. No bleeding noted.  Goal of Therapy:  INR 2-3   Plan:  No Coumadin tonight.  Daily INR.  Wendie Simmer, PharmD, BCPS Clinical Pharmacist  Pager:  [phone]

## 2012-02-10 NOTE — Progress Notes (Signed)
[facility]             Progress Note   Subjective: Feels better, no further diarrhea , formed stool yetserday,  breathing ok, ate breakfast, choking/coughing with meals  Objective: Vital signs in last 24 hours: Temp:  [97.1 F (36.2 C)-99 F (37.2 C)] 98.7 F (37.1 C) (09/02 0600) Pulse Rate:  [48-75] 66  (09/02 0600) Resp:  [16-20] 16  (09/02 0600) BP: (99-164)/(49-67) 99/62 mmHg (09/02 0600) SpO2:  [97 %-99 %] 97 % (09/02 0600) Weight:  [56.926 kg (125 lb 8 oz)] 56.926 kg (125 lb 8 oz) (09/01 1530) Weight change:  Last BM Date: 02/09/12  Intake/Output from previous day: 09/01 0701 - 09/02 0700 In: 1325 [I.V.:600; Blood:725] Out: 600 [Urine:600]     Physical Exam: General: Alert, awake, oriented x3, in no acute distress. HEENT: No bruits, no goiter. Heart: Regular rate and rhythm, without murmurs, rubs, gallops. Lungs: faint basilar crackles Abdomen: Soft, nontender, nondistended, positive bowel sounds. Extremities: L BKA, small opening of sutures at medial end Neuro: Grossly intact, nonfocal.    Lab Results: Basic Metabolic Panel:  Basename 02/10/12 0550 02/09/12 0520 02/08/12 0350  NA 137 139 --  K 4.8 4.6 --  CL 108 111 --  CO2 20 21 --  GLUCOSE 126* 94 --  BUN 19 23 --  CREATININE 1.08 1.14 --  CALCIUM 7.3* 6.8* --  MG -- -- 0.9*  PHOS -- -- 3.3   Liver Function Tests:  Basename 02/08/12 0350  AST 11  ALT 7  ALKPHOS 78  BILITOT 0.2*  PROT 6.5  ALBUMIN 1.7*   No results found for this basename: LIPASE:2,AMYLASE:2 in the last 72 hours No results found for this basename: AMMONIA:2 in the last 72 hours CBC:  Basename 02/10/12 0550 02/09/12 0520 02/07/12 1933  WBC 14.1* 10.8* --  NEUTROABS -- -- 7.0  HGB 9.3* 6.8* --  HCT 28.7* 20.8* --  MCV 85.4 87.8 --  PLT 223 240 --   Cardiac Enzymes:  Basename 02/08/12 1413 02/08/12 0824 02/08/12 0330  CKTOTAL -- -- --  CKMB -- -- --  CKMBINDEX -- -- --  TROPONINI <0.30 <0.30 <0.30    BNP: No results found for this basename: PROBNP:3 in the last 72 hours D-Dimer: No results found for this basename: DDIMER:2 in the last 72 hours CBG:  Basename 02/10/12 0734 02/09/12 2214 02/09/12 1214 02/09/12 0749 02/09/12 0419 02/09/12 0015  GLUCAP 126* 127* 133* 102* 90 82   Hemoglobin A1C:  Basename 02/08/12 0350  HGBA1C 6.4*   Fasting Lipid Panel: No results found for this basename: CHOL,HDL,LDLCALC,TRIG,CHOLHDL,LDLDIRECT in the last 72 hours Thyroid Function Tests:  Basename 02/08/12 0350  TSH 0.682  T4TOTAL --  FREET4 --  T3FREE --  THYROIDAB --   Anemia Panel:  Basename 02/08/12 0350  VITAMINB12 298  FOLATE 7.5  FERRITIN 1290*  TIBC 179*  IRON 39*  RETICCTPCT 1.2   Coagulation:  Basename 02/10/12 0550 02/09/12 0520  LABPROT 45.8* 51.5*  INR 4.82* 5.60*   Urine Drug Screen: Drugs of Abuse  No results found for this basename: labopia,  cocainscrnur,  labbenz,  amphetmu,  thcu,  labbarb    Alcohol Level: No results found for this basename: ETH:2 in the last 72 hours Urinalysis:  Basename 02/08/12 0415  COLORURINE YELLOW  LABSPEC 1.012  PHURINE 5.0  GLUCOSEU NEGATIVE  HGBUR MODERATE*  BILIRUBINUR NEGATIVE  KETONESUR NEGATIVE  PROTEINUR NEGATIVE  UROBILINOGEN 0.2  NITRITE POSITIVE*  LEUKOCYTESUR LARGE*  Recent Results (from the past 240 hour(s))  MRSA PCR SCREENING     Status: Normal   Collection Time   02/08/12  2:49 AM      Component Value Range Status Comment   MRSA by PCR NEGATIVE  NEGATIVE Final   URINE CULTURE     Status: Normal   Collection Time   02/08/12  4:15 AM      Component Value Range Status Comment   Specimen Description URINE, SUPRAPUBIC   Final    Special Requests NONE   Final    Culture  Setup Time 02/08/2012 11:21   Final    Colony Count >=100,000 COLONIES/ML   Final    Culture ESCHERICHIA COLI   Final    Report Status 02/10/2012 FINAL   Final    Organism ID, Bacteria ESCHERICHIA COLI   Final   CLOSTRIDIUM  DIFFICILE BY PCR     Status: Normal   Collection Time   02/09/12  2:58 PM      Component Value Range Status Comment   C difficile by pcr NEGATIVE  NEGATIVE Final     Studies/Results: No results found.  Medications: Scheduled Meds:    . atorvastatin  40 mg Oral QHS  . insulin aspart  0-9 Units Subcutaneous TID WC & HS  . magnesium oxide  400 mg Oral Daily  . OLANZapine  5 mg Oral QHS  . olanzapine-FLUoxetine  1 capsule Oral QHS  . pantoprazole  40 mg Oral Q1200  . sodium chloride  3 mL Intravenous Q12H  . DISCONTD: insulin aspart  0-9 Units Subcutaneous Q4H   Continuous Infusions:   PRN Meds:.acetaminophen, acetaminophen, albuterol, alum & mag hydroxide-simeth, guaiFENesin-dextromethorphan, HYDROcodone-acetaminophen, ondansetron (ZOFRAN) IV, ondansetron  Assessment/Plan:  1. Diarrhea - seems to be resolving, possibly viral gastroenteritis appears to be  running its course DC IVF Stool Cx negative  C.difficile PCR -negative   2. DIABETES MELLITUS, TYPE II -  sliding scale  3.. Atrial fibrillation - will hold metoprolol and digoxin given bradycardia, HR better as digoxin gets out of system, hold warfarin  4 .Hypotension/ Dehydration - secondary to volume depletion, diarrhea, improved, DC IV fluids and monitor her urine output  . Anemia-acute on chronic - anemia panel unremarkable, suspect chronic disease and worsened by acute illness, last EGD/Colonoscopy and capsule endoscopy 2 years ago, unable to find source of blood loss per wife Transfused 2units PRBC 9/1, FU with hematology as outpatient No overt blood loss  5.Hypocalcemia - will replace   6.Acute renal failure - - improved, stopped IVF  7. Coagulopathy: secondary to malnutrition/diarrhea/Vit K defi and coumadin, continue to hold coumadin  8.Debility - will have PT OT evaluation. He has home health. His family is weiry about nursing home placement secondary to bad experiences   9. Coughing/choking: check swallow  eval  Prophylaxis:on coumadin Protonix   CODE STATUS: DNR/DNI as per patient and family wishes  Ambulate, PT eval Home tomorrow     LOS: 3 days   [facility] Pager: [phone] 02/10/2012, 9:48 AM

## 2012-02-10 NOTE — Evaluation (Signed)
Occupational Therapy Evaluation Patient Details Name: [name] MRN: [id number] DOB: [birth year] Today's Date: 02/10/2012 Time: 7829-5621 OT Time Calculation (min): 11 min  OT Assessment / Plan / Recommendation Clinical Impression  78 yo male admitted for DM, diarrhea, and elevated INR. Pt with cognitive deficts and uncertain of level at baseline. Ot to follow on trial bases.    OT Assessment  Patient needs continued OT Services    Follow Up Recommendations  Skilled nursing facility    Barriers to Discharge      Equipment Recommendations  Defer to next venue    Recommendations for Other Services    Frequency  Other (comment) (x2 trial)    Precautions / Restrictions Precautions Precautions: Fall Restrictions Weight Bearing Restrictions: No   Pertinent Vitals/Pain None reported    ADL  Toilet Transfer: Simulated;+2 Total assistance Toilet Transfer: Patient Percentage: 20% Equipment Used: Gait belt Transfers/Ambulation Related to ADLs: pt transfered to Lt side with total+2 (A) due to Rt LE amputation. Chair alarmed placed    OT Diagnosis: Generalized weakness;Cognitive deficits  OT Problem List:   OT Treatment Interventions: Self-care/ADL training;DME and/or AE instruction;Therapeutic activities;Cognitive remediation/compensation;Patient/family education;Balance training   OT Goals Acute Rehab OT Goals OT Goal Formulation: Patient unable to participate in goal setting Time For Goal Achievement: 02/24/12 Potential to Achieve Goals: Good ADL Goals Pt Will Perform Grooming: with set-up;Sitting, chair;Supported ADL Goal: Grooming - Progress: Goal set today Pt Will Perform Upper Body Bathing: with set-up;Sitting, chair;Supported ADL Goal: Upper Body Bathing - Progress: Goal set today Pt Will Perform Upper Body Dressing: with set-up;Sitting, chair;Supported ADL Goal: Location manager Dressing - Progress: Goal set today Pt Will Transfer to Toilet: with min assist;3-in-1;Stand  pivot transfer ADL Goal: Toilet Transfer - Progress: Goal set today  Visit Information  Last OT Received On: 02/10/12 Assistance Needed: +2 PT/OT Co-Evaluation/Treatment: Yes    Subjective Data  Subjective: "I had it last month" pt reports Rt amputation is only a month old- its obvious from healing that has occurred its > 17 month old   Prior Functioning  Vision/Perception  Home Living Lives With: Spouse Available Help at Discharge: Family;Personal care attendant;Available 24 hours/day Home Adaptive Equipment: Wheelchair - manual Additional Comments: Unclear home situation as pt with some cognitive deficts.   Prior Function Level of Independence: Needs assistance Needs Assistance: Bathing;Dressing;Toileting;Meal Prep;Light Housekeeping;Gait;Transfers Bath: Maximal Dressing: Maximal Toileting: Total Meal Prep: Total Light Housekeeping: Total Gait Assistance: Unable Transfer Assistance: slides to W/C Able to Take Stairs?: No Driving: No Vocation: Retired Comments: Unclear PLOF as pt with cognitive deficits. pt indicates that he lives with his wife and has an Aide, but unable to state what Aide does for him. pt does note he has had a catheter at home and that he uses a bed pan for BMs Communication Communication: No difficulties Dominant Hand: Right      Cognition  Overall Cognitive Status: Impaired Area of Impairment: Memory Arousal/Alertness: Awake/alert Orientation Level: Disoriented to;Time;Situation Behavior During Session: [facility] for tasks performed Memory Deficits: pt with decreased recall of Home situation, PLOF and present situation and date.      Extremity/Trunk Assessment Right Upper Extremity Assessment RUE ROM/Strength/Tone: Within functional levels Left Upper Extremity Assessment LUE ROM/Strength/Tone: Within functional levels Right Lower Extremity Assessment RLE ROM/Strength/Tone: Deficits RLE ROM/Strength/Tone Deficits: pt with R BKA and generally weak.   Mildly contracted knee flexion.   Left Lower Extremity Assessment LLE ROM/Strength/Tone: Deficits LLE ROM/Strength/Tone Deficits: Generally weak with mild hip and knee flexion contractures.  Trunk Assessment Trunk Assessment: Kyphotic   Mobility  Shoulder Instructions  Bed Mobility Bed Mobility: Supine to Sit;Sitting - Scoot to Edge of Bed Supine to Sit: 3: Mod assist;With rails Sitting - Scoot to [place] of Bed: 2: Max assist Details for Bed Mobility Assistance: cues for use of UEs, sequencing, encouragement Transfers Transfers: Sit to Stand;Stand to Sit Sit to Stand: 1: +2 Total assist;From bed Sit to Stand: Patient Percentage: 20% Stand to Sit: 1: +2 Total assist;To chair/3-in-1 Stand to Sit: Patient Percentage: 20% Details for Transfer Assistance: pt at first hold on to bed rail and not letting go to allow for transfer.  pt needed cues and facilitation for positioning of L LE, blocking foot and knee, and A to perform pivot to chair.         Exercise     Balance Balance Balance Assessed: Yes Static Sitting Balance Static Sitting - Balance Support: Bilateral upper extremity supported (L foot supported) Static Sitting - Level of Assistance: 4: Min assist;3: Mod assist Static Sitting - Comment/# of Minutes:   pt fluctuated betwen Min and ModA to maintain balance EOB. pt leans to R and posteriorly    End of Session OT - End of Session Activity Tolerance: Patient tolerated treatment well Patient left: in chair;with call bell/phone within reach;with chair alarm set Nurse Communication: Mobility status  GO     Harrel Carina [facility] 02/10/2012, 3:38 PM Pager: [phone]

## 2012-02-10 NOTE — Evaluation (Signed)
Clinical/Bedside Swallow Evaluation Patient Details  Name: [name] MRN: [id number] Date of Birth: 1931/07/24  Today's Date: 02/10/2012 Time: 1620-     Past Medical History:  Past Medical History  Diagnosis Date  . A-fib   . CAD (coronary artery disease)   . Hyperlipidemia   . Diabetes mellitus   . Hypertension   . Suprapubic catheter   . Peripheral vascular disease   . Arthritis   . Cancer     prostate cancer  . Myocardial infarction    Past Surgical History:  Past Surgical History  Procedure Date  . Prostatectomy     radiation therapy  . Bladder cath   . Laminectomy     lumbar  . Lumbar disc surgery   . Right colectomy   . Incisional hernia repair   . Coronary artery bypass graft     2008  . Total hip arthroplasty     RT  . Cataract extraction 2012    Left 03/2011, Right 04/2011  . Amputation 12/03/2011    Procedure: AMPUTATION BELOW KNEE;  Surgeon: Sherren Kerns, MD;  Location: [facility] OR;  Service: Vascular;  Laterality: Right;   HPI:  78 y.o. male with diarrhea for one month now admitted with dehydration, debility and electrolytes abnormalities and in acute renal failure. He was hypotensive and bradycardic likely secondary to dehydration and his medications. Plan is for D/C home tomorrow.  Swallow eval ordered today secondary to pt c/o coughing/choking with meals.  Pt with hx of dysphagia.     Assessment / Plan / Recommendation Clinical Impression  Pt known to SLP services after May 2013 admission during which he underwent MBS and was followed by acute care SLP.  5/23 MBS revealed suspected primary esophageal dysphagia and moderate pharyngeal dysphagia.   Barium stasis observed in lower-mid esophagus with backflow to pharynx.  There were delays in swallow initiation and thin liquid penetration into larynx; pharyngeal residue post swallow.   Recs at the time were for continued mechanical soft diet with thin liquids and esophageal precautions to minimize  potential aspiration of esophageal backflow/pharyngeal residue.  Today's bedside swallow eval revealed consistent clinical symptoms with prior MBS.   Pt may benefit from esophageal f/u to determine etiology of impairment (dysmotility vs. stricture).  (Could either be done as inpt or OP.)  Reviewed recs; wife not available and pt with difficulty with short-term recall. Grandson present and assisted grandfather with comprehension of recommendations.    Aspiration Risk  Mild    Diet Recommendation Dysphagia 3 (Mechanical Soft);Thin liquid   Liquid Administration via: Cup Medication Administration: Whole meds with puree Supervision: Patient able to self feed Compensations: Slow rate;Small sips/bites;Follow solids with liquid Postural Changes and/or Swallow Maneuvers: Out of bed for meals;Seated upright 90 degrees;Upright 30-60 min after meal    Other  Recommendations Recommended Consults: Consider esophageal assessment     Rhetta Cleek L. [name], [place] CCC/SLP Pager [phone]        [name] 02/10/2012,4:51 PM

## 2012-02-10 NOTE — Progress Notes (Signed)
Spoke with pt's wife regarding home situation.  Wife states that she has help 5 days a week with [facility], aide, PT/OT.  [facility] staff assist with moving pt and wife feels that she will be able to maintain this arrangement with pt's current level of health.  Wife stated that they have hospital bed, walker, wheelchair and shower chair at home.  They have previously had a lift and wife returned equipment r/t lack of space and her perception that the pt should be helping with moves to build upper body strength.  Rinaldo Macqueen, [facility]

## 2012-02-10 NOTE — Plan of Care (Signed)
Problem: Phase III Progression Outcomes Goal: Voiding independently Outcome: Not Met (add Reason) Suprapubic cath r/t prostrate ca Goal: Foley discontinued Outcome: Not Met (add Reason) Suprapubic cath

## 2012-02-10 NOTE — Progress Notes (Signed)
   SUBJECTIVE:  He denies chest pain or SOB.   PHYSICAL EXAM Filed Vitals:   02/09/12 1200 02/09/12 1230 02/09/12 1530 02/09/12 2200  BP: 124/52 143/67 111/66 110/67  Pulse: 59 62 64 75  Temp:  97.1 F (36.2 C) 98.3 F (36.8 C) 98.8 F (37.1 C)  TempSrc:  Axillary Oral   Resp: 20 19 18 18   Height:   5\' 9"  (1.753 m)   Weight:   125 lb 8 oz (56.926 kg)   SpO2: 97%  99% 97%   General:  No distress Lungs:  Clear Heart:  Irregular Abdomen:  Positive bowel sounds, no rebound no guarding Extremities:  No edema (right BKA).  LABS: Lab Results  Component Value Date   TROPONINI <0.30 02/08/2012   Results for orders placed during the hospital encounter of 02/07/12 (from the past 24 hour(s))  PREPARE RBC (CROSSMATCH)     Status: Normal   Collection Time   02/09/12  6:10 AM      Component Value Range   Order Confirmation ORDER PROCESSED BY BLOOD BANK    GLUCOSE, CAPILLARY     Status: Abnormal   Collection Time   02/09/12  7:49 AM      Component Value Range   Glucose-Capillary 102 (*) 70 - 99 mg/dL  PREPARE RBC (CROSSMATCH)     Status: Normal   Collection Time   02/09/12  9:05 AM      Component Value Range   Order Confirmation ORDER PROCESSED BY BLOOD BANK    GLUCOSE, CAPILLARY     Status: Abnormal   Collection Time   02/09/12 12:14 PM      Component Value Range   Glucose-Capillary 133 (*) 70 - 99 mg/dL  CLOSTRIDIUM DIFFICILE BY PCR     Status: Normal   Collection Time   02/09/12  2:58 PM      Component Value Range   C difficile by pcr NEGATIVE  NEGATIVE  GLUCOSE, CAPILLARY     Status: Abnormal   Collection Time   02/09/12 10:14 PM      Component Value Range   Glucose-Capillary 127 (*) 70 - 99 mg/dL    Intake/Output Summary (Last 24 hours) at 02/10/12 0542 Last data filed at 02/09/12 1200  Gross per 24 hour  Intake   1425 ml  Output    600 ml  Net    825 ml    ASSESSMENT AND PLAN:  Atrial fibrillation: Rate is improved (60s - 70s).  Continue to hold digoxin and  metoprolol.   He is anemic and received PRBCs yesterday.   However, he seems to be tolerating his warfarin per pharmacy.  Hypotension:  Improved.  CAD:  No evidence of active ischemia      Beulah Matusek 02/10/2012 5:42 AM

## 2012-02-10 NOTE — Progress Notes (Signed)
Noted [name]'s HR to be elevated in the 120-140's.  When entering the room, [name] was coughing with visibly red face.  [name] states that he "choked" on his breakfast.  HR down to 99 afib.  Will cont to monitor and seek SLP eval.  Deuntae Kocsis, [facility]

## 2012-02-10 NOTE — Evaluation (Signed)
Physical Therapy Evaluation Patient Details Name: [name] MRN: [id number] DOB: 01/24/1932 Today's Date: 02/10/2012 Time: 1150-1206 PT Time Calculation (min): 16 min  PT Assessment / Plan / Recommendation Clinical Impression  pt presents with DM, diarrhea, and elevated INR.  pt with decreased cognition, so ? baseline level of mobility, but at this time pt is requiring extensive A for all mobility.  Any OOB mobility requires 2 person A.  pt would benefit from SNF at D/C, however RN notes wife not receptive to SNF.  pt will need mechanical lift if choosing to D/C to home and 24/7 A.      PT Assessment  Patient needs continued PT services    Follow Up Recommendations  Skilled nursing facility    Barriers to Discharge Other (comment) Unclear level of A at home and home situation.      Equipment Recommendations   (Mechanical lift if D/C to home)    Recommendations for Other Services     Frequency Min 3X/week    Precautions / Restrictions Precautions Precautions: Fall Restrictions Weight Bearing Restrictions: No   Pertinent Vitals/Pain Denies pain.      Mobility  Bed Mobility Bed Mobility: Supine to Sit;Sitting - Scoot to Edge of Bed Supine to Sit: 3: Mod assist;With rails Sitting - Scoot to Edge of Bed: 2: Max assist Details for Bed Mobility Assistance: cues for use of UEs, sequencing, encouragement Transfers Transfers: Heritage manager Transfers: 1: +2 Total assist Squat Pivot Transfers: Patient Percentage: 20% Details for Transfer Assistance: pt at first hold on to bed rail and not letting go to allow for transfer.  pt needed cues and facilitation for positioning of L LE, blocking foot and knee, and A to perform pivot to chair.   Ambulation/Gait Ambulation/Gait Assistance: Not tested (comment) Stairs: No Wheelchair Mobility Wheelchair Mobility: No    Exercises     PT Diagnosis: Generalized weakness  PT Problem List: Decreased strength;Decreased  activity tolerance;Decreased balance;Decreased mobility;Decreased cognition PT Treatment Interventions: DME instruction;Functional mobility training;Therapeutic activities;Therapeutic exercise;Balance training;Patient/family education;Cognitive remediation   PT Goals Acute Rehab PT Goals PT Goal Formulation: With patient Time For Goal Achievement: 02/24/12 Potential to Achieve Goals: Fair Pt will go Supine/Side to Sit: with min assist PT Goal: Supine/Side to Sit - Progress: Goal set today Pt will go Sit to Supine/Side: with min assist PT Goal: Sit to Supine/Side - Progress: Goal set today Pt will Transfer Bed to Chair/Chair to Bed: with min assist PT Transfer Goal: Bed to Chair/Chair to Bed - Progress: Goal set today  Visit Information  Last PT Received On: 02/10/12 Assistance Needed: +2 PT/OT Co-Evaluation/Treatment: Yes    Subjective Data  Subjective: I like sitting up.   Patient Stated Goal: None stated.     Prior Functioning  Home Living Lives With: Spouse Available Help at Discharge: Family;Personal care attendant;Available 24 hours/day Home Adaptive Equipment: Wheelchair - manual Additional Comments: Unclear home situation as pt with some cognitive deficts.   Prior Function Level of Independence: Needs assistance Needs Assistance: Bathing;Dressing;Toileting;Meal Prep;Light Housekeeping;Gait;Transfers Bath: Maximal Dressing: Maximal Toileting: Total Meal Prep: Total Light Housekeeping: Total Gait Assistance: Unable Transfer Assistance: slides to W/C Able to Take Stairs?: No Driving: No Vocation: Retired Comments: Unclear PLOF as pt with cognitive deficits.  pt indicates that he lives with his wife and has an Aide, but unable to state what Aide does for him.  pt does note he has had a catheter at home and that he uses a bed pan  for BMs.   Communication Communication: No difficulties    Cognition  Overall Cognitive Status: Impaired Area of Impairment:  Memory Arousal/Alertness: Awake/alert Orientation Level: Disoriented to;Time;Situation Behavior During Session: WFL for tasks performed Memory Deficits: pt with decreased recall of Home situation, PLOF and present situation and date.      Extremity/Trunk Assessment Right Lower Extremity Assessment RLE ROM/Strength/Tone: Deficits RLE ROM/Strength/Tone Deficits: pt with R BKA and generally weak.  Mildly contracted knee flexion.   Left Lower Extremity Assessment LLE ROM/Strength/Tone: Deficits LLE ROM/Strength/Tone Deficits: Generally weak with mild hip and knee flexion contractures.   Trunk Assessment Trunk Assessment: Kyphotic   Balance Balance Balance Assessed: Yes Static Sitting Balance Static Sitting - Balance Support: Bilateral upper extremity supported (L foot supported) Static Sitting - Level of Assistance: 4: Min assist;3: Mod assist Static Sitting - Comment/# of Minutes: pt fluctuated betwen Min and ModA to maintain balance EOB.  pt leans to R and posteriorly.    End of Session PT - End of Session Equipment Utilized During Treatment: Gait belt Activity Tolerance: Patient tolerated treatment well Patient left: in chair;with call bell/phone within reach;with chair alarm set Nurse Communication: Mobility status  GP     [name], [name] [phone] 02/10/2012, 12:44 PM

## 2012-02-11 ENCOUNTER — Inpatient Hospital Stay (HOSPITAL_COMMUNITY): Payer: Medicare Other

## 2012-02-11 ENCOUNTER — Telehealth: Payer: Self-pay | Admitting: *Deleted

## 2012-02-11 ENCOUNTER — Encounter (HOSPITAL_COMMUNITY): Payer: Self-pay | Admitting: Physician Assistant

## 2012-02-11 DIAGNOSIS — R0902 Hypoxemia: Secondary | ICD-10-CM | POA: Diagnosis not present

## 2012-02-11 DIAGNOSIS — D689 Coagulation defect, unspecified: Secondary | ICD-10-CM

## 2012-02-11 DIAGNOSIS — R1310 Dysphagia, unspecified: Secondary | ICD-10-CM

## 2012-02-11 DIAGNOSIS — K222 Esophageal obstruction: Secondary | ICD-10-CM

## 2012-02-11 DIAGNOSIS — R5381 Other malaise: Secondary | ICD-10-CM

## 2012-02-11 LAB — BASIC METABOLIC PANEL
CO2: 22 mEq/L (ref 19–32)
Calcium: 7.2 mg/dL — ABNORMAL LOW (ref 8.4–10.5)
GFR calc Af Amer: 77 mL/min — ABNORMAL LOW (ref >90)
GFR calc non Af Amer: 66 mL/min — ABNORMAL LOW (ref >90)
Sodium: 135 mEq/L (ref 135–145)

## 2012-02-11 LAB — CBC
Hemoglobin: 9.2 g/dL — ABNORMAL LOW (ref 13.0–17.0)
MCH: 27.8 pg (ref 26.0–34.0)
MCHC: 32.2 g/dL (ref 30.0–36.0)
MCV: 86.4 fL (ref 78.0–100.0)
RBC: 3.31 MIL/uL — ABNORMAL LOW (ref 4.22–5.81)

## 2012-02-11 LAB — PROTIME-INR: Prothrombin Time: 41.5 seconds — ABNORMAL HIGH (ref 11.6–15.2)

## 2012-02-11 LAB — GLUCOSE, CAPILLARY: Glucose-Capillary: 132 mg/dL — ABNORMAL HIGH (ref 70–99)

## 2012-02-11 LAB — FECAL LACTOFERRIN, QUANT

## 2012-02-11 MED ORDER — WARFARIN SODIUM 3 MG PO TABS: 1.5000 mg | ORAL_TABLET | ORAL | Status: DC

## 2012-02-11 MED ORDER — FUROSEMIDE 10 MG/ML IJ SOLN
40.0000 mg | Freq: Once | INTRAMUSCULAR | Status: AC
  Administered 2012-02-11: 40 mg via INTRAVENOUS

## 2012-02-11 MED ORDER — FUROSEMIDE 20 MG PO TABS: 20.0000 mg | ORAL_TABLET | Freq: Every day | ORAL | Status: DC

## 2012-02-11 MED ORDER — FUROSEMIDE 10 MG/ML IJ SOLN
40.0000 mg | Freq: Two times a day (BID) | INTRAMUSCULAR | Status: DC
  Administered 2012-02-12 – 2012-02-13 (×3): 40 mg via INTRAVENOUS
  Filled 2012-02-11 (×6): qty 4

## 2012-02-11 MED ORDER — PIPERACILLIN-TAZOBACTAM 3.375 G IVPB 30 MIN: 3.3750 g | INTRAVENOUS | Status: DC

## 2012-02-11 MED ORDER — MAGNESIUM OXIDE 400 (241.3 MG) MG PO TABS: 400.0000 mg | ORAL_TABLET | Freq: Every day | ORAL | Status: DC

## 2012-02-11 MED ORDER — VANCOMYCIN HCL 500 MG IV SOLR
500.0000 mg | Freq: Two times a day (BID) | INTRAVENOUS | Status: DC
  Administered 2012-02-11 – 2012-02-19 (×17): 500 mg via INTRAVENOUS
  Filled 2012-02-11 (×18): qty 500

## 2012-02-11 MED ORDER — FUROSEMIDE 10 MG/ML IJ SOLN
INTRAMUSCULAR | Status: AC
  Administered 2012-02-11: 40 mg via INTRAVENOUS
  Filled 2012-02-11: qty 4

## 2012-02-11 MED ORDER — LEVALBUTEROL HCL 0.63 MG/3ML IN NEBU
0.6300 mg | INHALATION_SOLUTION | Freq: Four times a day (QID) | RESPIRATORY_TRACT | Status: DC
  Administered 2012-02-12 – 2012-02-14 (×10): 0.63 mg via RESPIRATORY_TRACT
  Filled 2012-02-11 (×16): qty 3

## 2012-02-11 MED ORDER — FUROSEMIDE 10 MG/ML IJ SOLN: 40.0000 mg | Freq: Two times a day (BID) | INTRAMUSCULAR | Status: DC

## 2012-02-11 MED ORDER — DEXTROSE 5 % IV SOLN
2.0000 g | Freq: Two times a day (BID) | INTRAVENOUS | Status: DC
  Administered 2012-02-11 – 2012-02-13 (×4): 2 g via INTRAVENOUS
  Filled 2012-02-11 (×5): qty 2

## 2012-02-11 NOTE — Progress Notes (Signed)
UR Completed Romaldo Saville Graves-Bigelow, RN,BSN [phone]  

## 2012-02-11 NOTE — Progress Notes (Signed)
Addendum: notified by RN about sudden change in resp status Went from 97%RA to suddenly requiring 6L to keep sats >90%, temp of 102, HR 120s Was also seen coughing with lunch today Pt seen and examined Febrile, pale in mild resp distress, arousible, talking  CVS: S1S2/Irreg rhythm Lungs: Fine crackles at bases and scattered rhonchi in upper lung zones Suspect aspiration event +/_ fluid overload Transfer to SDU Start Ceftazidime and Vancomycin, allergic to PCN Lasix 40mg  now STAT CXR, blood cultures x2 Nebs/suctioning for pulm toliet DNR condition discussed with Wife at bedside, code status confirmed again, DNR  Zannie Cove, MD [phone]

## 2012-02-11 NOTE — Progress Notes (Signed)
ANTIBIOTIC CONSULT NOTE - INITIAL  Pharmacy Consult for Vancomycin/Ceftazidime Indication: possible urosepsis   Assessment: 44 YOM known to pharmacy for coumadin dosing, now with fever (102.7), leukocytosis (wbc = 12.8), and worsened resp. Status. Urine culture positive for E. Coli (resistant to ampicillin, sensitive to ancef, rocephin, cipro, zosyn and bactrim). Pharmacy is consulted to start vancomycin and ceftazidime for urosepsis vs. pneumonia. Patient with penicillin allergy (rash), discussed with MD, ok to start ceftazidime. Scr 1.05, est. crcl 18ml/min. Blood cultures pending  Goal of Therapy:  Vancomycin trough level 15-20 mcg/ml  Plan:  - Vancomycin 500mg  IV Q 12 hrs - Ceftazidime 2g IV Q 12hrs - f/u renal function and cultures, vancomycin trough at steady state if indicated.   Bayard Hugger, PharmD, BCPS  Clinical Pharmacist  Pager: [phone]  02/11/2012,4:53 PM   Allergies  Allergen Reactions  . Levaquin (Levofloxacin) Rash  . Codeine Nausea And Vomiting    Noted on MAR  . Penicillins Rash    Noted on [facility]    Patient Measurements: Height: 5\' 9"  (175.3 cm) Weight: 125 lb 8 oz (56.926 kg) IBW/kg (Calculated) : 70.7    Vital Signs: Temp: 102.7 F (39.3 C) (09/03 1538) Temp src: Rectal (09/03 1538) BP: 118/75 mmHg (09/03 1630) Pulse Rate: 122  (09/03 1630) Intake/Output from previous day: 09/02 0701 - 09/03 0700 In: 3 [I.V.:3] Out: 1725 [Urine:1725] Intake/Output from this shift: Total I/O In: 600 [P.O.:600] Out: 625 [Urine:625]  Labs:  [facility] 02/11/12 0525 02/10/12 0550 02/09/12 0520  WBC 12.8* 14.1* 10.8*  HGB 9.2* 9.3* 6.8*  PLT 182 223 240  LABCREA -- -- --  CREATININE 1.05 1.08 1.14   Estimated Creatinine Clearance: 47.4 ml/min (by C-G formula based on Cr of 1.05). No results found for this basename:  VANCOTROUGH:2,VANCOPEAK:2,VANCORANDOM:2,GENTTROUGH:2,GENTPEAK:2,GENTRANDOM:2,TOBRATROUGH:2,TOBRAPEAK:2,TOBRARND:2,AMIKACINPEAK:2,AMIKACINTROU:2,AMIKACIN:2, in the last 72 hours   Microbiology: Recent Results (from the past 720 hour(s))  STOOL CULTURE     Status: Normal   Collection Time   01/28/12  3:07 PM      Component Value Range Status Comment   Organism ID, Bacteria No Salmonella,Shigella,Campylobacter,Yersinia,or   Final    Organism ID, Bacteria No E.coli [id number]:H7 isolated.   Final   MRSA PCR SCREENING     Status: Normal   Collection Time   02/08/12  2:49 AM      Component Value Range Status Comment   MRSA by PCR NEGATIVE  NEGATIVE Final   URINE CULTURE     Status: Normal   Collection Time   02/08/12  4:15 AM      Component Value Range Status Comment   Specimen Description URINE, SUPRAPUBIC   Final    Special Requests NONE   Final    Culture  Setup Time 02/08/2012 11:21   Final    Colony Count >=100,000 COLONIES/ML   Final    Culture ESCHERICHIA COLI   Final    Report Status 02/10/2012 FINAL   Final    Organism ID, Bacteria ESCHERICHIA COLI   Final   CLOSTRIDIUM DIFFICILE BY PCR     Status: Normal   Collection Time   02/09/12  2:58 PM      Component Value Range Status Comment   C difficile by pcr NEGATIVE  NEGATIVE Final   STOOL CULTURE     Status: Normal (Preliminary result)   Collection Time   02/09/12  2:58 PM      Component Value Range Status Comment   Specimen Description STOOL   Final    Special Requests  NONE   Final    Culture NO SUSPICIOUS COLONIES, CONTINUING TO HOLD   Final    Report Status PENDING   Incomplete     Medical History: Past Medical History  Diagnosis Date  . A-fib   . CAD (coronary artery disease)   . Hyperlipidemia   . Diabetes mellitus   . Hypertension   . Suprapubic catheter   . Peripheral vascular disease   . Arthritis   . Cancer     prostate cancer  . Myocardial infarction

## 2012-02-11 NOTE — Care Management Note (Addendum)
    Page 1 of 2   02/12/2012     3:19:42 PM   CARE MANAGEMENT NOTE 02/12/2012  Patient:  [name], [name]   Account Number:  [id number]  Date Initiated:  02/11/2012  Documentation initiated by:  GRAVES-BIGELOW,Myrtha Tonkovich  Subjective/Objective Assessment:   Pt admitted with deyhydration. Pt has family support.     Action/Plan:   CM spoke to pt and wife and he has services with [facility] for [facility] services. CM did send information to [facility] for resumption of services.   Anticipated DC Date:  02/11/2012   Anticipated DC Plan:  HOME W HOME HEALTH SERVICES      DC Planning Services  CM consult      [facility] Choice  HOME HEALTH   Choice offered to / List presented to:  C-1 Patient        HH arranged  HH-1 RN  HH-10 DISEASE MANAGEMENT  HH-2 PT  HH-3 OT  HH-4 NURSE'S AIDE  HH-6 SOCIAL WORKER      HH agency  [facility]   Status of service:  Completed, signed off Medicare Important Message given?   (If response is "NO", the following Medicare IM given date fields will be blank) Date Medicare IM given:   Date Additional Medicare IM given:    Discharge Disposition:  HOME W HOME HEALTH SERVICES  Per UR Regulation:  Reviewed for med. necessity/level of care/duration of stay  If discussed at Long Length of Stay Meetings, dates discussed:   02/13/2012    Comments:  SOC to begin within 24-48 hours post d/c. No further needs for CM at this time.

## 2012-02-11 NOTE — Consult Note (Signed)
Referring Provider: No ref. provider found Primary Care Physician:  [name], MD Primary Gastroenterologist:  Physician at [facility]  Reason for Consultation:  Dysphagia  HPI: [name] is a 78 y.o. male who was admitted on 8/30 for diarrhea and dehydration.  The diarrhea was thought to be secondary to a viral gastroenteritis and has since resolved.  He was preparing to go home, however, he was noted to have coughing and choking when eating; therefore, GI was consulted.  Patient has history of dysphagia in the past and underwent a modified barium swallow study in May of this year, which showed suspected primary esophageal dysphagia and moderate pharyngeal dysphagia with barium stasis observed in lower-mid esophagus with backflow to the pharynx.  Recs at that time were for continued mechanical soft diet with thin liquids and esophageal precautions.  Yesterday he had a bedside swallow eval revealing consistent clinical symptoms with prior MBS.  Also recommended esophageal follow-up to determine etiology of impairment, ie dysmotility vs stricture.  Patient's wife was present during interview today.  It is reported that his dysphagia has progressively worsened over the last 6 months.  Occurs almost every time that he eats of drinks.  Coughs for a long time and eventually brings up phlegm.  Items go down at times because they do not always come back up, but pills have even come back up on occasion.  Wife is afraid that he will get dehydrated again because he does not like to eat or drink much since this is frustrating for him.  Just of note, patient reportedly had an EGD, colonoscopy, and WCE two years ago at [facility] for evaluation of his anemia.  Unremarkable results to their knowledge.    Past Medical History  Diagnosis Date  . A-fib   . CAD (coronary artery disease)   . Hyperlipidemia   . Diabetes mellitus   . Hypertension   . Suprapubic catheter   . Peripheral  vascular disease   . Arthritis   . Cancer     prostate cancer  . Myocardial infarction     Past Surgical History  Procedure Date  . Prostatectomy     radiation therapy  . Bladder cath   . Laminectomy     lumbar  . Lumbar disc surgery   . Right colectomy in 2006 due to ischemia   . Incisional hernia repair Umbilical hernia repair   . Coronary artery bypass graft     2008  . Total hip arthroplasty     RT  . Cataract extraction 2012    Left 03/2011, Right 04/2011  . Amputation 12/03/2011    Procedure: AMPUTATION BELOW KNEE;  Surgeon: Sherren Kerns, MD;  Location: [facility] OR;  Service: Vascular;  Laterality: Right;    Prior to Admission medications   Medication Sig Start Date End Date Taking? Authorizing Provider  atorvastatin (LIPITOR) 40 MG tablet Take 40 mg by mouth at bedtime.   Yes Historical Provider, MD  metFORMIN (GLUCOPHAGE) 500 MG tablet Take 500 mg by mouth 2 (two) times daily.   Yes Historical Provider, MD  OLANZapine (ZYPREXA) 5 MG tablet Take 5 mg by mouth at bedtime.   Yes Historical Provider, MD  olanzapine-FLUoxetine (SYMBYAX) 6-25 MG per capsule Take 1 capsule by mouth at bedtime.   Yes Historical Provider, MD  Omega-3 Fatty Acids (FISH OIL) 1200 MG CAPS Take 1 capsule by mouth daily.   Yes Historical Provider, MD  omeprazole (PRILOSEC) 20 MG capsule Take 20 mg by  mouth daily.   Yes Historical Provider, MD  furosemide (LASIX) 20 MG tablet Take 1 tablet (20 mg total) by mouth daily. 02/11/12   Zannie Cove, MD  magnesium oxide (MAG-OX) 400 (241.3 MG) MG tablet Take 1 tablet (400 mg total) by mouth daily. 02/11/12 02/10/13  Zannie Cove, MD  warfarin (COUMADIN) 3 MG tablet Take 0.5-1 tablets (1.5-3 mg total) by mouth See admin instructions. DO not restart coumadin, till INR <3, INR check 9/5 per [facility] RN 02/11/12   Zannie Cove, MD    Current Facility-Administered Medications  Medication Dose Route Frequency Provider Last Rate Last Dose  . acetaminophen (TYLENOL) tablet  650 mg  650 mg Oral Q6H PRN Therisa Doyne, MD       Or  . acetaminophen (TYLENOL) suppository 650 mg  650 mg Rectal Q6H PRN Therisa Doyne, MD      . albuterol (PROVENTIL) (5 MG/ML) 0.5% nebulizer solution 2.5 mg  2.5 mg Nebulization Q2H PRN Therisa Doyne, MD      . alum & mag hydroxide-simeth (MAALOX/MYLANTA) 200-200-20 MG/5ML suspension 30 mL  30 mL Oral Q6H PRN Therisa Doyne, MD      . atorvastatin (LIPITOR) tablet 40 mg  40 mg Oral QHS Therisa Doyne, MD   40 mg at 02/10/12 2116  . guaiFENesin-dextromethorphan (ROBITUSSIN DM) 100-10 MG/5ML syrup 5 mL  5 mL Oral Q4H PRN Therisa Doyne, MD      . HYDROcodone-acetaminophen (NORCO/VICODIN) 5-325 MG per tablet 1-2 tablet  1-2 tablet Oral Q4H PRN Therisa Doyne, MD      . insulin aspart (novoLOG) injection 0-9 Units  0-9 Units Subcutaneous TID WC & HS Zannie Cove, MD   2 Units at 02/11/12 1213  . magnesium oxide (MAG-OX) tablet 400 mg  400 mg Oral Daily Zannie Cove, MD   400 mg at 02/11/12 1057  . OLANZapine (ZYPREXA) tablet 5 mg  5 mg Oral QHS Therisa Doyne, MD   5 mg at 02/10/12 2116  . olanzapine-FLUoxetine (SYMBYAX) 6-25 MG per capsule 1 capsule  1 capsule Oral QHS Therisa Doyne, MD   1 capsule at 02/10/12 2116  . ondansetron (ZOFRAN) tablet 4 mg  4 mg Oral Q6H PRN Therisa Doyne, MD       Or  . ondansetron (ZOFRAN) injection 4 mg  4 mg Intravenous Q6H PRN Therisa Doyne, MD      . pantoprazole (PROTONIX) EC tablet 40 mg  40 mg Oral Q1200 Therisa Doyne, MD   40 mg at 02/11/12 1212  . sodium chloride 0.9 % injection 3 mL  3 mL Intravenous Q12H Therisa Doyne, MD   3 mL at 02/10/12 2117    Allergies as of 02/07/2012 - Review Complete 02/07/2012  Allergen Reaction Noted  . Levaquin (levofloxacin) Rash 10/31/2011  . Codeine Other (See Comments) 05/03/2009  . Penicillins Other (See Comments) 05/03/2009    Family History  Problem Relation Age of Onset  . Coronary artery disease       siblings  . Coronary artery disease Father   . Diabetes Sister   . Hyperlipidemia      family history    History   Social History  . Marital Status: Married    Spouse Name: Louie Casa    Number of Children: N/A  . Years of Education: N/A   Occupational History  . Retired Merchandiser, retail for [employer].     Social History Main Topics  . Smoking status: Former Smoker -- 0.2 packs/day for 10 years    Types:  Cigarettes    Quit date: 06/10/1970  . Smokeless tobacco: Never Used  . Alcohol Use: No  . Drug Use: No  . Sexually Active: Not Currently   Other Topics Concern  . 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 No caffeine.      Review of Systems: Ten point ROS O/W negative except as mentioned in HPI.  Physical Exam: Vital signs in last 24 hours: Temp:  [98.1 F (36.7 C)-98.5 F (36.9 C)] 98.1 F (36.7 C) (09/03 0600) Pulse Rate:  [62-71] 69  (09/03 0600) Resp:  [16-18] 16  (09/03 0600) BP: (95-109)/(62-70) 109/62 mmHg (09/03 0600) SpO2:  [97 %] 97 % (09/03 0600) Last BM Date: 02/09/12 General:   Alert, pleasant and cooperative in NAD Head:  Normocephalic and atraumatic. Eyes:  Sclera clear, no icterus.  Conjunctiva pink. Ears:  Normal auditory acuity. Mouth:  No deformity or lesions.   Lungs:  Clear throughout to auscultation.  No wheezes, crackles, or rhonchi. Heart:  Regular rate and rhythm; no murmurs, clicks, rubs,  or gallops. Abdomen:  Soft,non-distended, nontender, BS active, nonpalp mass or hsm.  Multiple scars noted on the abdomen.   Rectal:  Deferred  Msk:  Right BKA with bandage. Pulses:  Normal pulses noted. Extremities:  Without clubbing or edema.  Right BKA with bandage. Neurologic:  Alert and  oriented x4;  grossly normal neurologically. Skin:  Intact without significant lesions or rashes. Psych:  Alert and cooperative.  Depressed mood and affect.  Intake/Output from previous day: 09/02 0701  - 09/03 0700 In: 3 [I.V.:3] Out: 1725 [Urine:1725] Intake/Output this shift: Total I/O In: 240 [P.O.:240] Out: 375 [Urine:375]  Lab Results:  [facility] 02/11/12 0525 02/10/12 0550 02/09/12 0520  WBC 12.8* 14.1* 10.8*  HGB 9.2* 9.3* 6.8*  HCT 28.6* 28.7* 20.8*  PLT 182 223 240   BMET  Basename 02/11/12 0525 02/10/12 0550 02/09/12 0520  NA 135 137 139  K 4.7 4.8 4.6  CL 106 108 111  CO2 22 20 21   GLUCOSE 97 126* 94  BUN 16 19 23   CREATININE 1.05 1.08 1.14  CALCIUM 7.2* 7.3* 6.8*    PT/INR  Basename 02/11/12 0525 02/10/12 0550  LABPROT 41.5* 45.8*  INR 4.25* 4.82*    IMPRESSION:  -Progressive dysphagia to solids and liquids-with primary esophageal and moderate pharyngeal dysphagia with barium stasis seen in lower-mid esophagus with backflow into pharynx observed on swallow evals.  Rule out motility vs. stricture/narrowing.  -Supratherapeutic INR-has not received coumadin since his admission.  ?Component of malnutrition  -Diarrhea-resolved  -Anemia-s/p 2 units PRBC's on this admission.  Was evaluated extensively with his routine GI physician as an outpatient previously.  No evidence of GIB currently.  To see heme/onc as well.  PLAN: -Follow-up results of barium esophagram that has been ordered. -If EGD to be performed, then he will need further reversal of his INR.   ZEHR, JESSICA D.  02/11/2012, 1:28 PM  Pager number [phone]    ________________________________________________________________________  [facility] MD note:  I personally examined the patient, reviewed the data and agree with the assessment and plan described above.  He had acute resp distress this afternoon. I suspect he is aspirating (from oropharngeal mechanism?).  He may also have esophageal component as well. EGD may be needed but after todays  Rigors, fever, resp distress and the fact that his inr >4  I favor speech therapy evaluation first (can compare to [facility]  done 3-4 months  ago).  Recommend speech therapy evaluation for choking, coughing with meals (likely aspirating)   Rob Bunting, MD [facility] Pager [phone]

## 2012-02-11 NOTE — Telephone Encounter (Signed)
These results were faxed over to [facility] on 02/05/2012

## 2012-02-11 NOTE — Progress Notes (Addendum)
Asked to assist with patient with increasing respiratory distress.  On arrival patient supine in bed - on 6 liter nasal cannula with O2 sats 90-93% - using abd accessory muscles - skin hot and dry.  Alert and answering questions.  Moderate distress - able to complete sentences.  Bil BS present with fine crackles bilateral - some coarse rhonchi in bases greater on right.  RR 32 - HR 132 BP 155/85.  Checked rectal temp 102.7.  Wife reports "shaking" and c/o being hot prior to going to xray for swallow test. Reports increased coughing since eating lunch today.  Right leg DDI.  Patient denies pain.  Placed on PNRB mask for better airflow - working hard to get flow from nasal cannula.  Dr. Jomarie Longs notified PTA.  Portable CXR done - patient exhibits increased WOB and RR with activity.  #20 angiocath inserted left forearm times one attempt - left forearm IV cath d/c'd .  Dr. Jomarie Longs present.  RR more relaxed with increased O2 flow - BP remains stable - 133/78 HR 112-135 RR 24-28 O2 sat 98-100% .  Wife updated by Dr. Jomarie Longs.  Lasix 40 mg IV given per Neysa Bonito, RN per Dr. Jomarie Longs order.  Patient to move to SDU.    Recheck at 1800 - patient more alert - RR 20 no distress.  Weaned to 35% FM - to change to [place].  Diuresed and antibiotics started.  To remain on floor since now more stable.

## 2012-02-11 NOTE — Progress Notes (Signed)
Pt shaking and having rigors, unable to get accurate temp oral, temp 100.5 axillary. Pt becoming tachypneic, resp >30. Pt breath sounds congested greater on right lobe. Sats 84% on RA. Placed on 6l O2. Sats 89%. Dr. Jomarie Longs notified. Rapid response nurse Debbie called for assistance. Emelda Brothers RN

## 2012-02-11 NOTE — Progress Notes (Signed)
Pt resting much better. Resp 18. Pt titrated back to 3l oxygen, sats 97%. Temp 98.9 axillary. Emelda Brothers RN

## 2012-02-11 NOTE — Progress Notes (Addendum)
[facility]         Progress Note [name] is a 79/M , with multiple co-morbidities Presented with  He has had diarrhea for 2-3 weeks ever since he left nursing home. 4-5 BM a day , for the past one week it has become severe. He is getting very weak and unable to walk or even transfer to the whealchair. His home health nurse called his primary care doctor and he was sent to emergency department. in ED He was found to be severely dehydrated and hypotensive. Denies any blood in the stool it is very watery no improvement with imodium.  He has indwelling catheter since 2006 since prostate cancer. 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  Subsequently his diarrhea/hypotension and ARF have resolved with IVF and received PRBC transfusion too, he was also getting evaluated for dysphagia   Assessment/Plan:  1. Diarrhea - resolved, possibly viral gastroenteritis appears to have run its course  DC IVF  Stool Cx negative  C.difficile PCR -negative  Formed stool yesterday   2. DIABETES MELLITUS, TYPE II - sliding scale   3.. Atrial fibrillation - metoprolol and digoxin on hold given bradycardia, HR better as digoxin gets out of system, hold warfarin, INR 4.4 Appreciate cards input   4 . Hypotension/ Dehydration - secondary to volume depletion, diarrhea, improved, stopped IV fluids and monitor her urine output   5. Anemia-acute on chronic - anemia panel unremarkable, suspect chronic disease and worsened by acute illness, last EGD/Colonoscopy and capsule endoscopy 2 years ago, unable to find source of blood loss per wife  Transfused 2units PRBC 9/1, FU with hematology as outpatient  No overt blood loss   5. Hypocalcemia - will replace   6. Acute renal failure - - improved, stopped IVF   7. Coagulopathy: secondary to malnutrition/diarrhea/Vit K defi and coumadin, received Vit K on admission, continue to hold coumadin   8. Debility - s/p Pt/OT eval, decline  SNF  9. Dysphagia with persistent coughing with meals: s/p Swallow eval now and MBS in 5/23 with esophageal and pharyngeal dysphagia  Will get DG esophagus and request GI eval  If scheduled for EGD will need FFP to reverse INR  Wife unfortunately reluctant to get this worked up as outpatient   10. Asymptomatic bacteruria:  Urine cultures with [facility] but patient completely asymptomatic from this at this time, hence did not treat.   Prophylaxis:coagulopathic/ Protonix   CODE STATUS: DNR/DNI as per patient and family wishes  Ambulate, PT eval  Pt and wife decline SNF, home with [facility] services after GI eval  LOS: 4 days  Waunetta Riggle  [facility]  Pager: [phone]  02/11/2012, 12:59 PM    Subjective: Feels better, no further diarrhea , formed stool yetserday,  breathing ok, ate breakfast, choking/coughing with meals  Objective: Vital signs in last 24 hours: Temp:  [98.1 F (36.7 C)-98.5 F (36.9 C)] 98.1 F (36.7 C) (09/03 0600) Pulse Rate:  [62-71] 69  (09/03 0600) Resp:  [16-18] 16  (09/03 0600) BP: (95-109)/(62-70) 109/62 mmHg (09/03 0600) SpO2:  [97 %] 97 % (09/03 0600) Weight change:  Last BM Date: 02/09/12  Intake/Output from previous day: 09/02 0701 - 09/03 0700 In: 3 [I.V.:3] Out: 1725 [Urine:1725] Total I/O In: 240 [P.O.:240] Out: 375 [Urine:375]   Physical Exam: General: Alert, awake, oriented x3, in no acute distress. HEENT: No bruits, no goiter. Heart: Regular rate and rhythm, without murmurs, rubs, gallops. Lungs: faint basilar crackles Abdomen: Soft,  nontender, nondistended, positive bowel sounds. Extremities: L BKA, small opening of sutures at medial end Neuro: Grossly intact, nonfocal.    Lab Results: Basic Metabolic Panel:  Basename 02/11/12 0525 02/10/12 0550  NA 135 137  K 4.7 4.8  CL 106 108  CO2 22 20  GLUCOSE 97 126*  BUN 16 19  CREATININE 1.05 1.08  CALCIUM 7.2* 7.3*  MG -- --  PHOS -- --   Liver Function Tests: No  results found for this basename: AST:2,ALT:2,ALKPHOS:2,BILITOT:2,PROT:2,ALBUMIN:2 in the last 72 hours No results found for this basename: LIPASE:2,AMYLASE:2 in the last 72 hours No results found for this basename: AMMONIA:2 in the last 72 hours CBC:  Basename 02/11/12 0525 02/10/12 0550  WBC 12.8* 14.1*  NEUTROABS -- --  HGB 9.2* 9.3*  HCT 28.6* 28.7*  MCV 86.4 85.4  PLT 182 223   Cardiac Enzymes:  Basename 02/08/12 1413  CKTOTAL --  CKMB --  CKMBINDEX --  TROPONINI <0.30   BNP: No results found for this basename: PROBNP:3 in the last 72 hours D-Dimer: No results found for this basename: DDIMER:2 in the last 72 hours CBG:  Basename 02/11/12 1131 02/11/12 0738 02/10/12 2052 02/10/12 1650 02/10/12 1207 02/10/12 0734  GLUCAP 178* 95 106* 132* 103* 126*   Hemoglobin A1C: No results found for this basename: HGBA1C in the last 72 hours Fasting Lipid Panel: No results found for this basename: CHOL,HDL,LDLCALC,TRIG,CHOLHDL,LDLDIRECT in the last 72 hours Thyroid Function Tests: No results found for this basename: TSH,T4TOTAL,FREET4,T3FREE,THYROIDAB in the last 72 hours Anemia Panel: No results found for this basename: VITAMINB12,FOLATE,FERRITIN,TIBC,IRON,RETICCTPCT in the last 72 hours Coagulation:  Basename 02/11/12 0525 02/10/12 0550  LABPROT 41.5* 45.8*  INR 4.25* 4.82*   Urine Drug Screen: Drugs of Abuse  No results found for this basename: labopia,  cocainscrnur,  labbenz,  amphetmu,  thcu,  labbarb    Alcohol Level: No results found for this basename: ETH:2 in the last 72 hours Urinalysis: No results found for this basename: COLORURINE:2,APPERANCEUR:2,LABSPEC:2,PHURINE:2,GLUCOSEU:2,HGBUR:2,BILIRUBINUR:2,KETONESUR:2,PROTEINUR:2,UROBILINOGEN:2,NITRITE:2,LEUKOCYTESUR:2 in the last 72 hours Recent Results (from the past 240 hour(s))  MRSA PCR SCREENING     Status: Normal   Collection Time   02/08/12  2:49 AM      Component Value Range Status Comment   MRSA by PCR  NEGATIVE  NEGATIVE Final   URINE CULTURE     Status: Normal   Collection Time   02/08/12  4:15 AM      Component Value Range Status Comment   Specimen Description URINE, SUPRAPUBIC   Final    Special Requests NONE   Final    Culture  Setup Time 02/08/2012 11:21   Final    Colony Count >=100,000 COLONIES/ML   Final    Culture ESCHERICHIA COLI   Final    Report Status 02/10/2012 FINAL   Final    Organism ID, Bacteria ESCHERICHIA COLI   Final   CLOSTRIDIUM DIFFICILE BY PCR     Status: Normal   Collection Time   02/09/12  2:58 PM      Component Value Range Status Comment   C difficile by pcr NEGATIVE  NEGATIVE Final   STOOL CULTURE     Status: Normal (Preliminary result)   Collection Time   02/09/12  2:58 PM      Component Value Range Status Comment   Specimen Description STOOL   Final    Special Requests NONE   Final    Culture NO SUSPICIOUS COLONIES, CONTINUING TO HOLD   Final  Report Status PENDING   Incomplete     Studies/Results: No results found.  Medications: Scheduled Meds:    . atorvastatin  40 mg Oral QHS  . insulin aspart  0-9 Units Subcutaneous TID WC & HS  . magnesium oxide  400 mg Oral Daily  . OLANZapine  5 mg Oral QHS  . olanzapine-FLUoxetine  1 capsule Oral QHS  . pantoprazole  40 mg Oral Q1200  . sodium chloride  3 mL Intravenous Q12H   Continuous Infusions:   PRN Meds:.acetaminophen, acetaminophen, albuterol, alum & mag hydroxide-simeth, guaiFENesin-dextromethorphan, HYDROcodone-acetaminophen, ondansetron (ZOFRAN) IV, ondansetron  Assessment/Plan:  1. Diarrhea - resolved, possibly viral gastroenteritis appears to have run its course DC IVF Stool Cx negative C.difficile PCR -negative  Formed stool yesterday  2. DIABETES MELLITUS, TYPE II -  sliding scale  3.. Atrial fibrillation -  metoprolol and digoxin on hold given bradycardia, HR better as digoxin gets out of system, hold warfarin Appreciate cards input  4 . Hypotension/ Dehydration -  secondary to volume depletion, diarrhea, improved, stopped IV fluids and monitor her urine output  5. Anemia-acute on chronic - anemia panel unremarkable, suspect chronic disease and worsened by acute illness, last EGD/Colonoscopy and capsule endoscopy 2 years ago, unable to find source of blood loss per wife Transfused 2units PRBC 9/1, FU with hematology as outpatient No overt blood loss  5. Hypocalcemia - will replace   6. Acute renal failure - - improved, stopped IVF  7. Coagulopathy: secondary to malnutrition/diarrhea/Vit K defi and coumadin, received Vit K on admission, continue to hold coumadin  8. Debility - will have PT OT evaluation. He has home health. His family is weiry about nursing home placement secondary to bad experiences   9. Dysphagia with persistent coughing with meals: s/p Swallow eval now and MBS in 5/23 with esophageal and pharyngeal dysphagia Will get DG esophagus and request GI eval If scheduled for EGD will need FFP to reverse INR Wife unfortunately reluctant to get this worked up as outpatient  10. Asymptomatic bacteruria: Urine cultures with [facility] but patient completely asymptomatic from this at this time, hence did not treat.  Prophylaxis:coagulopathic/ Protonix   CODE STATUS: DNR/DNI as per patient and family wishes  Ambulate, PT eval Pt and wife decline SNF, home with [facility] services after GI eval     LOS: 4 days   [facility] Pager: [phone] 02/11/2012, 12:59 PM

## 2012-02-12 ENCOUNTER — Encounter (HOSPITAL_COMMUNITY): Payer: Self-pay | Admitting: Gastroenterology

## 2012-02-12 ENCOUNTER — Encounter (HOSPITAL_COMMUNITY)
Admission: EM | Disposition: A | Discharge status: Home Health | Payer: Self-pay | Source: Home / Self Care | Attending: Internal Medicine

## 2012-02-12 DIAGNOSIS — F0390 Unspecified dementia without behavioral disturbance: Secondary | ICD-10-CM

## 2012-02-12 DIAGNOSIS — K222 Esophageal obstruction: Secondary | ICD-10-CM | POA: Diagnosis present

## 2012-02-12 HISTORY — PX: ESOPHAGOGASTRODUODENOSCOPY: SHX5428

## 2012-02-12 LAB — BASIC METABOLIC PANEL
CO2: 22 mEq/L (ref 19–32)
Chloride: 107 mEq/L (ref 96–112)
Glucose, Bld: 114 mg/dL — ABNORMAL HIGH (ref 70–99)
Potassium: 4.6 mEq/L (ref 3.5–5.1)
Sodium: 138 mEq/L (ref 135–145)

## 2012-02-12 LAB — CBC
Hemoglobin: 9.3 g/dL — ABNORMAL LOW (ref 13.0–17.0)
MCH: 28.4 pg (ref 26.0–34.0)
RBC: 3.27 MIL/uL — ABNORMAL LOW (ref 4.22–5.81)
WBC: 16.5 10*3/uL — ABNORMAL HIGH (ref 4.0–10.5)

## 2012-02-12 LAB — PRO B NATRIURETIC PEPTIDE: Pro B Natriuretic peptide (BNP): 35772 pg/mL — ABNORMAL HIGH (ref 0–450)

## 2012-02-12 LAB — PROTIME-INR
INR: 4.94 — ABNORMAL HIGH (ref 0.00–1.49)
Prothrombin Time: 46.7 seconds — ABNORMAL HIGH (ref 11.6–15.2)

## 2012-02-12 LAB — GLUCOSE, CAPILLARY
Glucose-Capillary: 92 mg/dL (ref 70–99)
Glucose-Capillary: 76 mg/dL (ref 70–99)
Glucose-Capillary: 99 mg/dL (ref 70–99)

## 2012-02-12 SURGERY — EGD (ESOPHAGOGASTRODUODENOSCOPY)
Anesthesia: Moderate Sedation

## 2012-02-12 MED ORDER — NALOXONE HCL 0.4 MG/ML IJ SOLN
0.4000 mg | INTRAMUSCULAR | Status: DC | PRN
  Administered 2012-02-12: 0.2 mg via INTRAVENOUS

## 2012-02-12 MED ORDER — NALOXONE HCL 0.4 MG/ML IJ SOLN
INTRAMUSCULAR | Status: AC
  Filled 2012-02-12: qty 1

## 2012-02-12 MED ORDER — MIDAZOLAM HCL 5 MG/ML IJ SOLN
INTRAMUSCULAR | Status: AC
  Filled 2012-02-12: qty 2

## 2012-02-12 MED ORDER — BUTAMBEN-TETRACAINE-BENZOCAINE 2-2-14 % EX AERO
INHALATION_SPRAY | CUTANEOUS | Status: DC | PRN
  Administered 2012-02-12: 2 via TOPICAL

## 2012-02-12 MED ORDER — FENTANYL CITRATE 0.05 MG/ML IJ SOLN
INTRAMUSCULAR | Status: AC
  Filled 2012-02-12: qty 2

## 2012-02-12 MED ORDER — FENTANYL CITRATE 0.05 MG/ML IJ SOLN
INTRAMUSCULAR | Status: DC | PRN
  Administered 2012-02-12 (×2): 25 ug via INTRAVENOUS

## 2012-02-12 MED ORDER — MIDAZOLAM HCL 10 MG/2ML IJ SOLN
INTRAMUSCULAR | Status: DC | PRN
  Administered 2012-02-12 (×2): 2 mg via INTRAVENOUS

## 2012-02-12 NOTE — Progress Notes (Signed)
SUBJECTIVE:  Acute events noted yesterday with probable aspiration.  Denies chest pain or SOB currently.   PHYSICAL EXAM Filed Vitals:   02/11/12 2100 02/12/12 0000 02/12/12 0401 02/12/12 0830  BP: 92/61  93/48   Pulse: 79  68   Temp: 98.4 F (36.9 C)  97.4 F (36.3 C)   TempSrc: Oral  Oral   Resp: 18  18   Height:      Weight:      SpO2: 100% 100% 100% 98%   General:  No distress Lungs:  Clear Heart:  Irregular Abdomen:  Positive bowel sounds, no rebound no guarding Extremities:  No edema (right BKA).  LABS: Lab Results  Component Value Date   TROPONINI <0.30 02/08/2012   Results for orders placed during the hospital encounter of 02/07/12 (from the past 24 hour(s))  GLUCOSE, CAPILLARY     Status: Abnormal   Collection Time   02/11/12 11:31 AM      Component Value Range   Glucose-Capillary 178 (*) 70 - 99 mg/dL   Comment 1 Notify RN    GLUCOSE, CAPILLARY     Status: Abnormal   Collection Time   02/11/12  4:29 PM      Component Value Range   Glucose-Capillary 183 (*) 70 - 99 mg/dL   Comment 1 Notify RN    GLUCOSE, CAPILLARY     Status: Abnormal   Collection Time   02/11/12  9:37 PM      Component Value Range   Glucose-Capillary 132 (*) 70 - 99 mg/dL   Comment 1 Notify RN    PROTIME-INR     Status: Abnormal   Collection Time   02/12/12  5:20 AM      Component Value Range   Prothrombin Time 46.7 (*) 11.6 - 15.2 seconds   INR 4.94 (*) 0.00 - 1.49  CBC     Status: Abnormal   Collection Time   02/12/12  5:20 AM      Component Value Range   WBC 16.5 (*) 4.0 - 10.5 K/uL   RBC 3.27 (*) 4.22 - 5.81 MIL/uL   Hemoglobin 9.3 (*) 13.0 - 17.0 g/dL   HCT 84.1 (*) 32.4 - 40.1 %   MCV 86.2  78.0 - 100.0 fL   MCH 28.4  26.0 - 34.0 pg   MCHC 33.0  30.0 - 36.0 g/dL   RDW 02.7 (*) 25.3 - 66.4 %   Platelets 255  150 - 400 K/uL  BASIC METABOLIC PANEL     Status: Abnormal   Collection Time   02/12/12  5:20 AM      Component Value Range   Sodium 138  135 - 145 mEq/L   Potassium  4.6  3.5 - 5.1 mEq/L   Chloride 107  96 - 112 mEq/L   CO2 22  19 - 32 mEq/L   Glucose, Bld 114 (*) 70 - 99 mg/dL   BUN 19  6 - 23 mg/dL   Creatinine, Ser 4.03  0.50 - 1.35 mg/dL   Calcium 7.5 (*) 8.4 - 10.5 mg/dL   GFR calc non Af Amer 53 (*) >90 mL/min   GFR calc Af Amer 62 (*) >90 mL/min  GLUCOSE, CAPILLARY     Status: Normal   Collection Time   02/12/12  7:51 AM      Component Value Range   Glucose-Capillary 92  70 - 99 mg/dL   Comment 1 Notify RN      Intake/Output Summary (  Last 24 hours) at 02/12/12 1610 Last data filed at 02/12/12 0400  Gross per 24 hour  Intake    600 ml  Output   1625 ml  Net  -1025 ml    ASSESSMENT AND PLAN:  Atrial fibrillation: He should stay off of his dig and beta blocker.  INR is still high.    Hypotension:  Improved but still running low.  He is getting IV bid Lasix currently.  I would suggest backing off of this as his resp situation is improved.    CAD:  No evidence of active ischemia   We will follow as needed.     Corey Laski 02/12/2012 8:32 AM

## 2012-02-12 NOTE — H&P (View-Only) (Signed)
**[name]** Sean [name]  Subjective:  Patient with fever, tachycardia, tachypnea, hypotension, and respiratory distress yesterday afternoon, likely aspiration.  On abx.  Esophagram results noted-limited study with barium stasis in the vallecula and piriform sinus.  Is NPO and speech to reevaluate.  Objective:  Vital signs in last 24 hours: Temp:  [97.4 F (36.3 C)-102.7 F (39.3 C)] 97.4 F (36.3 C) (09/04 0401) Pulse Rate:  [68-159] 68  (09/04 0401) Resp:  [18] 18  (09/04 0401) BP: (92-155)/(48-88) 93/48 mmHg (09/04 0401) SpO2:  [84 %-100 %] 98 % (09/04 0830) Last BM Date: 02/09/12 General:   Alert, pleasant and cooperative, in NAD Heart:  Regular rate and rhythm; no murmurs Pulm:  Coarse lung sounds B/L. Abdomen:  Soft, nontender and nondistended. Normal bowel sounds, without guarding, and without rebound.  Multiple scars noted. Extremities:  Without edema.  R BKA with bandage. Neurologic:  Alert and  oriented x4;  grossly normal neurologically. Psych:  Alert and cooperative. Normal mood and affect.  Intake/Output from previous day: 09/03 0701 - 09/04 0700 In: 600 [P.O.:600] Out: 1625 [Urine:1625] Intake/Output this shift: Total I/O In: -  Out: 1000 [Urine:1000]  Lab Results:  Basename 02/12/12 0520 02/11/12 0525 02/10/12 0550  WBC 16.5* 12.8* 14.1*  HGB 9.3* 9.2* 9.3*  HCT 28.2* 28.6* 28.7*  PLT 255 182 223   BMET  Basename 02/12/12 0520 02/11/12 0525 02/10/12 0550  NA 138 135 137  K 4.6 4.7 4.8  CL 107 106 108  CO2 22 22 20  GLUCOSE 114* 97 126*  BUN 19 16 19  CREATININE 1.26 1.05 1.08  CALCIUM 7.5* 7.2* 7.3*   PT/INR  Basename 02/12/12 0520 02/11/12 0525  LABPROT 46.7* 41.5*  INR 4.94* 4.25*   Dg Esophagus  02/11/2012  *RADIOLOGY REPORT*  Clinical Data: Difficulty swallowing.  Cough.  ESOPHOGRAM/BARIUM SWALLOW  Technique:  Single contrast examination was performed using thin barium.  Fluoroscopy time:  0.5 minutes.  Comparison:  None.   Findings:  There is marked stasis of barium in the vallecula or the piriform sinuses.  There was difficulty positioning the patient given his limited mobility.  Only small amount of barium passed into the esophagus.  Given marked stasis in the valleculae or piriform sinuses, the examination was terminated due to the risk of aspiration.  IMPRESSION: Very limited study demonstrating marked stasis of barium in the valleculae or piriform sinuses.  Speech therapy consultation is recommended to evaluate for potential aspiration.   Original Report Authenticated By: Sean [name], M.D.    Dg Chest Port 1 View  02/11/2012  *RADIOLOGY REPORT*  Clinical Data: Hypoxia  PORTABLE CHEST - 1 VIEW  Comparison: Chest x-ray 02/08/2012.  Findings: Lung volumes are normal.  Bibasilar opacities may represent areas of atelectasis and/or consolidation, likely with superimposed small bilateral pleural effusions (right greater than left).  There is some cephalization of the pulmonary vasculature and indistinct interstitial markings, suggesting mild interstitial pulmonary edema.  In addition, however, there is more focal airspace disease, particularly in the right mid and lower lung, and the left base.  Mild cardiomegaly, increased compared to the prior examination. The patient is rotated to the right on today's exam, resulting in distortion of the mediastinal contours and reduced diagnostic sensitivity and specificity for mediastinal pathology. Atherosclerosis of the thoracic aorta.  Status post median sternotomy for CABG.  IMPRESSION: 1.  The appearance of the chest suggests mild congestive heart failure, as above. 2.  In addition, there is more focal airspace   consolidation in the right mid and lower lung, and at the left base, concerning for superimposed infection.  Alternatively, this pattern could be seen in the setting of recent aspiration.  Clinical correlation is recommended. 3.  Atherosclerosis. 4.  Status post CABG.    Original Report Authenticated By: Sean [name], M.D.     Assessment / Plan: -Progressive dysphagia to solids and liquids-now with probable aspiration PNA and receiving abx.  Once again, would have speech reevaluate; will follow-up results of that.  EGD not ideal at this time due to patient's recent events and high INR.  ? Need for PEG in the future even if temporary until patient becomes stronger and swallowing possibly improves.  -Supratherapeutic INR-has not received coumadin since his admission and INR even higher today. ? Component of malnutrition.   -Diarrhea-resolved   -Anemia-s/p 2 units PRBC's on 9/1 with stable Hgb since that time. Was evaluated extensively with his routine GI physician as an outpatient previously. No evidence of GIB currently. To see heme/onc as well.   LOS: 5 days   ZEHR, JESSICA D.  02/12/2012, 8:54 AM  Pager number [phone]    ________________________________________________________________________  [facility] MD [name]:  I personally examined the patient, reviewed the data.  He is breathing very well this AM.  INR 5, going up despite holding coumadin.  His swallowing is clearly a problem for him.  Coughs with any food, liquids.  Previous MBS has also suggested an esohpageal component but clinically I'm not so sure (has pretty rare dysphagia).  I am going to proceed with diagnostic EGD this AM.  Perhaps he has candida infection, perhaps malignancy.  Will elevated INR, he understands I cannot dilate.   Atiyah Bauer, MD [facility] Pager [phone]  

## 2012-02-12 NOTE — Progress Notes (Addendum)
CSW received referral for transportation needs to assist with pt transportation home when medically stable. CSW will arrange non emergency ambulance transportation as requested.  Pt recommended for skilled nursing for rehab, however pt and pt spouse refusing placement.  .Clinical social worker continuing to follow pt to assist with pt dc plans and further csw needs.   Catha Gosselin, Theresia Majors  [phone] 02/11/2012  9:16am

## 2012-02-12 NOTE — Interval H&P Note (Signed)
History and Physical Interval Note:  02/12/2012 10:50 AM  [name]  has presented today for surgery, with the diagnosis of Dysphagia  The various methods of treatment have been discussed with the patient and family. After consideration of risks, benefits and other options for treatment, the patient has consented to  Procedure(s) (LRB) with comments: ESOPHAGOGASTRODUODENOSCOPY (EGD) (N/A) as a surgical intervention .  The patient's history has been reviewed, patient examined, no change in status, stable for surgery.  I have reviewed the patient's chart and labs.  Questions were answered to the patient's satisfaction.     Rob Bunting

## 2012-02-12 NOTE — Progress Notes (Signed)
[facility]         Progress Note [name] is a 79/M , with multiple co-morbidities Presented with  He has had diarrhea for 2-3 weeks ever since he left nursing home. 4-5 BM a day , for the past one week it has become severe. He is getting very weak and unable to walk or even transfer to the whealchair. His home health nurse called his primary care doctor and he was sent to emergency department. in ED He was found to be severely dehydrated and hypotensive. Denies any blood in the stool it is very watery no improvement with imodium.  He has indwelling catheter since 2006 since prostate cancer. 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  Subsequently his diarrhea/hypotension and ARF have resolved with IVF and received PRBC transfusion too, he was also getting evaluated for dysphagia   Assessment/Plan:  1. Diarrhea - resolved, possibly viral gastroenteritis appears to have run its course  DC IVF  Stool Cx negative  C.difficile PCR -negative  Formed stool yesterday   2. DIABETES MELLITUS, TYPE II - sliding scale   3.. Atrial fibrillation - metoprolol and digoxin on hold given bradycardia, HR better as digoxin gets out of system, hold warfarin, INR 4.4 Appreciate cards input   4 . Hypotension/ Dehydration - secondary to volume depletion, diarrhea, improved, stopped IV fluids and monitor her urine output   5. Anemia-acute on chronic - anemia panel unremarkable, suspect chronic disease and worsened by acute illness, last EGD/Colonoscopy and capsule endoscopy 2 years ago, unable to find source of blood loss per wife  Transfused 2units PRBC 9/1, FU with hematology as outpatient  No overt blood loss   5. Hypocalcemia - will replace   6. Acute renal failure - - improved, stopped IVF   7. Coagulopathy: secondary to malnutrition/diarrhea/Vit K defi and coumadin, received Vit K on admission, continue to hold coumadin   8. Debility - s/p Pt/OT eval, decline  SNF although may need  9. Dysphagia with persistent coughing with meals: s/p Swallow eval now and MBS in 5/23 with esophageal and pharyngeal dysphagia  Achalasia seen with endoscopy and repeat swallow they'll now has patient with dysphagia 3 mechanical soft diet and no liquids.   10. Asymptomatic bacteruria:  Urine cultures with [facility] but patient completely asymptomatic from this at this time, hence did not treat.   Prophylaxis:coagulopathic/ Protonix   CODE STATUS: DNR/DNI as per patient and family wishes  Ambulate, PT eval  Pt and wife decline SNF, home with [facility] services in the morning if white blood cell count improving and INR reasonable.   LOS: 4 days  JOSEPH,PREETHA  [facility]  Pager: [phone]  02/11/2012, 12:59 PM    Subjective: Feels better, no further diarrhea , formed stool yetserday,  breathing ok, ate breakfast, choking/coughing with meals  Objective: Vital signs in last 24 hours: Temp:  [97.4 F (36.3 C)-98.9 F (37.2 C)] 97.5 F (36.4 C) (09/04 1422) Pulse Rate:  [68-82] 77  (09/04 1045) Resp:  [16-77] 24  (09/04 1355) BP: (77-123)/(23-92) 111/59 mmHg (09/04 1355) SpO2:  [97 %-100 %] 99 % (09/04 1422) Weight change:  Last BM Date: 02/12/12  Intake/Output from previous day: 09/03 0701 - 09/04 0700 In: 600 [P.O.:600] Out: 1625 [Urine:1625] Total I/O In: -  Out: 2000 [Urine:2000]   Physical Exam: General: Alert, awake, oriented x3, in no acute distress. HEENT: No bruits, no goiter. Heart: Regular rate and rhythm, without murmurs, rubs, gallops. Lungs: faint  basilar crackles Abdomen: Soft, nontender, nondistended, positive bowel sounds. Extremities: L BKA, small opening of sutures at medial end Neuro: Grossly intact, nonfocal.    Lab Results: Basic Metabolic Panel:  Basename 02/12/12 0520 02/11/12 0525  NA 138 135  K 4.6 4.7  CL 107 106  CO2 22 22  GLUCOSE 114* 97  BUN 19 16  CREATININE 1.26 1.05  CALCIUM 7.5* 7.2*  MG -- --    PHOS -- --   Liver Function Tests: No results found for this basename: AST:2,ALT:2,ALKPHOS:2,BILITOT:2,PROT:2,ALBUMIN:2 in the last 72 hours No results found for this basename: LIPASE:2,AMYLASE:2 in the last 72 hours No results found for this basename: AMMONIA:2 in the last 72 hours CBC:  Basename 02/12/12 0520 02/11/12 0525  WBC 16.5* 12.8*  NEUTROABS -- --  HGB 9.3* 9.2*  HCT 28.2* 28.6*  MCV 86.2 86.4  PLT 255 182   Cardiac Enzymes: No results found for this basename: CKTOTAL:3,CKMB:3,CKMBINDEX:3,TROPONINI:3 in the last 72 hours BNP:  Basename 02/12/12 0945  PROBNP 35772.0*   D-Dimer: No results found for this basename: DDIMER:2 in the last 72 hours CBG:  Basename 02/12/12 1713 02/12/12 1440 02/12/12 1238 02/12/12 0751 02/11/12 2137 02/11/12 1629  GLUCAP 99 76 76 92 132* 183*   Hemoglobin A1C: No results found for this basename: HGBA1C in the last 72 hours Fasting Lipid Panel: No results found for this basename: CHOL,HDL,LDLCALC,TRIG,CHOLHDL,LDLDIRECT in the last 72 hours Thyroid Function Tests: No results found for this basename: TSH,T4TOTAL,FREET4,T3FREE,THYROIDAB in the last 72 hours Anemia Panel: No results found for this basename: VITAMINB12,FOLATE,FERRITIN,TIBC,IRON,RETICCTPCT in the last 72 hours Coagulation:  Basename 02/12/12 0520 02/11/12 0525  LABPROT 46.7* 41.5*  INR 4.94* 4.25*   Urine Drug Screen: Drugs of Abuse  No results found for this basename: labopia,  cocainscrnur,  labbenz,  amphetmu,  thcu,  labbarb    Alcohol Level: No results found for this basename: ETH:2 in the last 72 hours Urinalysis: No results found for this basename: COLORURINE:2,APPERANCEUR:2,LABSPEC:2,PHURINE:2,GLUCOSEU:2,HGBUR:2,BILIRUBINUR:2,KETONESUR:2,PROTEINUR:2,UROBILINOGEN:2,NITRITE:2,LEUKOCYTESUR:2 in the last 72 hours Recent Results (from the past 240 hour(s))  MRSA PCR SCREENING     Status: Normal   Collection Time   02/08/12  2:49 AM      Component Value Range  Status Comment   MRSA by PCR NEGATIVE  NEGATIVE Final   URINE CULTURE     Status: Normal   Collection Time   02/08/12  4:15 AM      Component Value Range Status Comment   Specimen Description URINE, SUPRAPUBIC   Final    Special Requests NONE   Final    Culture  Setup Time 02/08/2012 11:21   Final    Colony Count >=100,000 COLONIES/ML   Final    Culture ESCHERICHIA COLI   Final    Report Status 02/10/2012 FINAL   Final    Organism ID, Bacteria ESCHERICHIA COLI   Final   CLOSTRIDIUM DIFFICILE BY PCR     Status: Normal   Collection Time   02/09/12  2:58 PM      Component Value Range Status Comment   C difficile by pcr NEGATIVE  NEGATIVE Final   STOOL CULTURE     Status: Normal (Preliminary result)   Collection Time   02/09/12  2:58 PM      Component Value Range Status Comment   Specimen Description STOOL   Final    Special Requests NONE   Final    Culture NO SUSPICIOUS COLONIES, CONTINUING TO HOLD   Final    Report Status  PENDING   Incomplete   CULTURE, BLOOD (ROUTINE X 2)     Status: Normal (Preliminary result)   Collection Time   02/11/12  4:57 PM      Component Value Range Status Comment   Specimen Description BLOOD RIGHT ARM   Final    Special Requests BOTTLES DRAWN AEROBIC AND ANAEROBIC 10CC   Final    Culture  Setup Time 02/11/2012 20:39   Final    Culture     Final    Value:        BLOOD CULTURE RECEIVED NO GROWTH TO DATE CULTURE WILL BE HELD FOR 5 DAYS BEFORE ISSUING A FINAL NEGATIVE REPORT   Report Status PENDING   Incomplete   CULTURE, BLOOD (ROUTINE X 2)     Status: Normal (Preliminary result)   Collection Time   02/11/12  5:03 PM      Component Value Range Status Comment   Specimen Description BLOOD RIGHT HAND   Final    Special Requests BOTTLES DRAWN AEROBIC AND ANAEROBIC 10CC   Final    Culture  Setup Time 02/11/2012 20:39   Final    Culture     Final    Value:        BLOOD CULTURE RECEIVED NO GROWTH TO DATE CULTURE WILL BE HELD FOR 5 DAYS BEFORE ISSUING A FINAL  NEGATIVE REPORT   Report Status PENDING   Incomplete     Studies/Results: Dg Esophagus  02/11/2012  *RADIOLOGY REPORT*  Clinical Data: Difficulty swallowing.  Cough.  ESOPHOGRAM/BARIUM SWALLOW  Technique:  Single contrast examination was performed using thin barium.  Fluoroscopy time:  0.5 minutes.  Comparison:  None.  Findings:  There is marked stasis of barium in the vallecula or the piriform sinuses.  There was difficulty positioning the patient given his limited mobility.  Only small amount of barium passed into the esophagus.  Given marked stasis in the valleculae or piriform sinuses, the examination was terminated due to the risk of aspiration.  IMPRESSION: Very limited study demonstrating marked stasis of barium in the valleculae or piriform sinuses.  Speech therapy consultation is recommended to evaluate for potential aspiration.   Original Report Authenticated By: Bernadene Bell. Maricela Curet, M.D.    Dg Chest Port 1 View  02/11/2012  *RADIOLOGY REPORT*  Clinical Data: Hypoxia  PORTABLE CHEST - 1 VIEW  Comparison: Chest x-ray 02/08/2012.  Findings: Lung volumes are normal.  Bibasilar opacities may represent areas of atelectasis and/or consolidation, likely with superimposed small bilateral pleural effusions (right greater than left).  There is some cephalization of the pulmonary vasculature and indistinct interstitial markings, suggesting mild interstitial pulmonary edema.  In addition, however, there is more focal airspace disease, particularly in the right mid and lower lung, and the left base.  Mild cardiomegaly, increased compared to the prior examination. The patient is rotated to the right on today's exam, resulting in distortion of the mediastinal contours and reduced diagnostic sensitivity and specificity for mediastinal pathology. Atherosclerosis of the thoracic aorta.  Status post median sternotomy for CABG.  IMPRESSION: 1.  The appearance of the chest suggests mild congestive heart failure, as  above. 2.  In addition, there is more focal airspace consolidation in the right mid and lower lung, and at the left base, concerning for superimposed infection.  Alternatively, this pattern could be seen in the setting of recent aspiration.  Clinical correlation is recommended. 3.  Atherosclerosis. 4.  Status post CABG.   Original Report Authenticated By: Florencia Reasons,  M.D.     Medications: Scheduled Meds:    . atorvastatin  40 mg Oral QHS  . cefTAZidime (FORTAZ)  IV  2 g Intravenous Q12H  . furosemide  40 mg Intravenous Q12H  . insulin aspart  0-9 Units Subcutaneous TID WC & HS  . levalbuterol  0.63 mg Nebulization Q6H  . magnesium oxide  400 mg Oral Daily  . OLANZapine  5 mg Oral QHS  . olanzapine-FLUoxetine  1 capsule Oral QHS  . pantoprazole  40 mg Oral Q1200  . sodium chloride  3 mL Intravenous Q12H  . vancomycin  500 mg Intravenous Q12H  . DISCONTD: furosemide  40 mg Intravenous Q12H   Continuous Infusions:   PRN Meds:.acetaminophen, acetaminophen, albuterol, alum & mag hydroxide-simeth, guaiFENesin-dextromethorphan, HYDROcodone-acetaminophen, naloxone, ondansetron (ZOFRAN) IV, ondansetron, DISCONTD: butamben-tetracaine-benzocaine, DISCONTD: fentaNYL, DISCONTD: midazolam  Assessment/Plan:  1. Diarrhea - resolved, possibly viral gastroenteritis appears to have run its course DC IVF Stool Cx negative C.difficile PCR -negative  Formed stool yesterday  2. DIABETES MELLITUS, TYPE II -  sliding scale  3.. Atrial fibrillation -  metoprolol and digoxin on hold given bradycardia, HR better as digoxin gets out of system, hold warfarin Appreciate cards input  4 . Hypotension/ Dehydration - secondary to volume depletion, diarrhea, improved, stopped IV fluids and monitor her urine output  5. Anemia-acute on chronic - anemia panel unremarkable, suspect chronic disease and worsened by acute illness, last EGD/Colonoscopy and capsule endoscopy 2 years ago, unable to find source of  blood loss per wife Transfused 2units PRBC 9/1, FU with hematology as outpatient No overt blood loss  5. Hypocalcemia - will replace   6. Acute renal failure - - improved, stopped IVF  7. Coagulopathy: secondary to malnutrition/diarrhea/Vit K defi and coumadin, received Vit K on admission, continue to hold coumadin  8. Debility - will have PT OT evaluation. He has home health. His family is weiry about nursing home placement secondary to bad experiences   9. Dysphagia with persistent coughing with meals: s/p Swallow eval now and MBS in 5/23 with esophageal and pharyngeal dysphagia Will get DG esophagus and request GI eval If scheduled for EGD will need FFP to reverse INR Wife unfortunately reluctant to get this worked up as outpatient  10. Asymptomatic bacteruria: Urine cultures with [facility] but patient completely asymptomatic from this at this time, hence did not treat.  Prophylaxis:coagulopathic/ Protonix   CODE STATUS: DNR/DNI as per patient and family wishes  Ambulate, PT eval Pt and wife decline SNF, home with [facility] services after GI eval     LOS: 5 days   Hollice Espy [facility] Pager: [phone] 02/12/2012, 5:33 PM

## 2012-02-12 NOTE — Progress Notes (Signed)
ANTICOAGULATION CONSULT NOTE - Follow Up Consult  Pharmacy Consult for coumadin Indication: atrial fibrillation  Allergies  Allergen Reactions  . Levaquin (Levofloxacin) Rash  . Codeine Nausea And Vomiting    Noted on MAR  . Penicillins Rash    Noted on [facility]    Patient Measurements: Height: 5\' 9"  (175.3 cm) Weight: 125 lb 8 oz (56.926 kg) IBW/kg (Calculated) : 70.7    Vital Signs: Temp: 97.4 F (36.3 C) (09/04 0401) Temp src: Oral (09/04 0401) BP: 93/48 mmHg (09/04 0401) Pulse Rate: 68  (09/04 0401)  Labs:  Basename 02/12/12 0520 02/11/12 0525 02/10/12 0550  HGB 9.3* 9.2* --  HCT 28.2* 28.6* 28.7*  PLT 255 182 223  APTT -- -- --  LABPROT 46.7* 41.5* 45.8*  INR 4.94* 4.25* 4.82*  HEPARINUNFRC -- -- --  CREATININE 1.26 1.05 1.08  CKTOTAL -- -- --  CKMB -- -- --  TROPONINI -- -- --    Estimated Creatinine Clearance: 39.5 ml/min (by C-G formula based on Cr of 1.26).  Assessment: Patient is a 78 y.o M on coumadin for afib.  INR remains supratherapeutic at 4.94.  Coumadin has been on hold since admission.  No bleeding noted.  Goal of Therapy:  INR 2-3  Plan:  1) continue to hold coumadin for now  Kellsie Grindle P 02/12/2012,8:49 AM

## 2012-02-12 NOTE — Op Note (Signed)
[name] [address] [place], [address]   ENDOSCOPY PROCEDURE REPORT  PATIENT: [name], [name]  MR#: [id number] BIRTHDATE: Dec 20, 1931 , 79  yrs. old GENDER: Male ENDOSCOPIST: Rachael Fee, MD PROCEDURE DATE:  02/12/2012 PROCEDURE:  EGD, diagnostic ASA CLASS:     Class IV INDICATIONS:  worsening solid and liquid dysphagia, coughing, clinically he is aspirating. MEDICATIONS: Fentanyl 50 mcg IV and Versed 4 mg IV TOPICAL ANESTHETIC: Cetacaine Spray  DESCRIPTION OF PROCEDURE: After the risks benefits and alternatives of the procedure were thoroughly explained, informed consent was obtained.  The Pentax Gastroscope [id number] endoscope was introduced through the mouth and advanced to the second portion of the duodenum. Without limitations.  The instrument was slowly withdrawn as the mucosa was fully examined.    There were secretions pooling in distal esophagus and smooth stenosis of GE junction.   The stenosis was partial, allowed easy passage of gastroscope without pressure.  The UGI examination was otherwise normal.  Retroflexed views revealed no abnormalities. The scope was then withdrawn from the patient and the procedure completed. COMPLICATIONS: There were no complications.  ENDOSCOPIC IMPRESSION: Pooling of secretions in distal esophagus and smooth GE junction partial stenosis.  Possible achalasia.  RECOMMENDATIONS: Would still appreciate repeat evaluation by speech therapy, he seems to have a pretty significant aspiration immediately with swallowing, clinically more consistent with oropharyngeal issue. He is also unstable medically with recent rigors, resp distress and his INR continues to climb despite holding coumadin.  Will consider Botox injection trial (to treat possible achalasia) at GE junction (would want INR less than 1.7 or so).    eSigned:  Rachael Fee, MD 02/12/2012 11:24 [name]

## 2012-02-12 NOTE — Evaluation (Addendum)
Clinical/Bedside Swallow Evaluation Patient Details  Name: [name] MRN: [id number] Date of Birth: 11-27-1931  Today's Date: 02/12/2012 Time: 1420- 1453    Past Medical History:  Past Medical History  Diagnosis Date  . A-fib   . CAD (coronary artery disease)   . Hyperlipidemia   . Diabetes mellitus   . Hypertension   . Suprapubic catheter   . Peripheral vascular disease   . Arthritis   . Cancer     prostate cancer  . Myocardial infarction    Past Surgical History:  Past Surgical History  Procedure Date  . Prostatectomy     radiation therapy  . Bladder cath   . Laminectomy     lumbar  . Lumbar disc surgery   . 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   Marland Kitchen Umbilical hernia repair 1990's, 2006    umbilical hernia repair in 1990s, super umbilical hernia repaired in 2006  . Coronary artery bypass graft     2008  . Total hip arthroplasty     RT  . Cataract extraction 2012    Left 03/2011, Right 04/2011  . Amputation 12/03/2011    Procedure: AMPUTATION BELOW KNEE;  Surgeon: Sherren Kerns, MD;  Location: [facility] OR;  Service: Vascular;  Laterality: Right;   HPI:  78 y.o. male  78 year old gentleman with diarrhea for one month now with dehydration, debility and electrolytes abnormalities and in acute renal failure. He is hypotensive and bradycardic likely secondary to dehydration and his medications.  Underwent prior MBS 5/13 which revealed both oropharyngeal and esophageal dysphagia.  Bedside swallow eval completed during this admission (9/2).    Assessment / Plan / Recommendation Clinical Impression  SLP re-eval ordered after observed likely aspiration event and change in respiratory status on 9/3. Pt subsequently underwent esophagram which revealed pharyngeal stasis but was limited (due to concerns for aspiration).  EGD 9/4 revealed pooling of secretions in distal esophagus and smooth GE junction partial stenosis; possible achalasia.  Pt  presented with symptoms of oropharyngeal dysphagia with signs of aspiration.  Spoke at length with pt and wife this afternoon.  Recommend proceeding with another MBS given recent events and to determine current status.  Although aspiration is anticipated, mechanism of dysphagia can be better delineated, and may direct treatment plan for specific rehabilitative measures.  Pending MBS tomorrow, recommend allowing solid foods but holding all liquids since they are source of most difficulty.  Pt and wife agree.           Diet Recommendation Dysphagia 3 (Mechanical Soft);No liquids   Supervision: Patient able to self feed Postural Changes and/or Swallow Maneuvers: Out of bed for meals;Seated upright 90 degrees;Upright 30-60 min after meal    Other  Recommendations Oral Care Recommendations: Oral care BID       Swallow Study Prior Functional Status       General HPI: 78 y.o. male  79 year old gentleman with diarrhea for one month now with dehydration, debility and electrolytes abnormalities and in acute renal failure. He is hypotensive and bradycardic likely secondary to dehydration and his medications Type of Study: Bedside swallow evaluation Previous Swallow Assessment: MBS 10/31/11: Suspected primary esophageal dysphagia;Moderate pharyngeal phase dysphagia. Esophageal stasis noted throughout mid-lower esophagus upon screen; backflow of POs into pharynx.  Recs at that time for Dysphagia 3, thin liquids with esophageal precautions. Diet Prior to this Study: NPO Temperature Spikes Noted: Yes Respiratory Status: Other (comment) (2.5 liters Arnold) Behavior/Cognition: Alert;Confused Oral  Cavity - Dentition: Dentures, top;Dentures, bottom Self-Feeding Abilities: Able to feed self Patient Positioning: Upright in bed Baseline Vocal Quality: Clear Volitional Cough: Strong Volitional Swallow: Able to elicit    Oral/Motor/Sensory Function Overall Oral Motor/Sensory Function: Appears within functional  limits for tasks assessed   Ice Chips Ice chips: Not tested   Thin Liquid      Nectar Thick Nectar Thick Liquid: Impaired Presentation: Cup;Self Fed Pharyngeal Phase Impairments: Wet Vocal Quality (coughed with 25% of trials)             [name] L. [name], [place] CCC/SLP Pager [phone]  [name] 02/12/2012,3:36 PM

## 2012-02-12 NOTE — Progress Notes (Signed)
SBP on low to mid 70s, patient arousable radial pulse strong on palpation, paged Dr Christella Hartigan , informed him about low BP , with  order to give Narcan, 0.2 mg narcan given via IV at 1213. 1220 Patient fully awake, talking, denies any pain latest BP 105/57, will continue to monitor

## 2012-02-12 NOTE — Progress Notes (Signed)
[facility] Progress Note  Subjective:  Patient with fever, tachycardia, tachypnea, hypotension, and respiratory distress yesterday afternoon, likely aspiration.  On abx.  Esophagram results noted-limited study with barium stasis in the vallecula and piriform sinus.  Is NPO and speech to reevaluate.  Objective:  Vital signs in last 24 hours: Temp:  [97.4 F (36.3 C)-102.7 F (39.3 C)] 97.4 F (36.3 C) (09/04 0401) Pulse Rate:  [68-159] 68  (09/04 0401) Resp:  [18] 18  (09/04 0401) BP: (92-155)/(48-88) 93/48 mmHg (09/04 0401) SpO2:  [84 %-100 %] 98 % (09/04 0830) Last BM Date: 02/09/12 General:   Alert, pleasant and cooperative, in NAD Heart:  Regular rate and rhythm; no murmurs Pulm:  Coarse lung sounds B/L. Abdomen:  Soft, nontender and nondistended. Normal bowel sounds, without guarding, and without rebound.  Multiple scars noted. Extremities:  Without edema.  R BKA with bandage. Neurologic:  Alert and  oriented x4;  grossly normal neurologically. Psych:  Alert and cooperative. Normal mood and affect.  Intake/Output from previous day: 09/03 0701 - 09/04 0700 In: 600 [P.O.:600] Out: 1625 [Urine:1625] Intake/Output this shift: Total I/O In: -  Out: 1000 [Urine:1000]  Lab Results:  [facility] 02/12/12 0520 02/11/12 0525 02/10/12 0550  WBC 16.5* 12.8* 14.1*  HGB 9.3* 9.2* 9.3*  HCT 28.2* 28.6* 28.7*  PLT 255 182 223   BMET  Basename 02/12/12 0520 02/11/12 0525 02/10/12 0550  NA 138 135 137  K 4.6 4.7 4.8  CL 107 106 108  CO2 22 22 20   GLUCOSE 114* 97 126*  BUN 19 16 19   CREATININE 1.26 1.05 1.08  CALCIUM 7.5* 7.2* 7.3*   PT/INR  Basename 02/12/12 0520 02/11/12 0525  LABPROT 46.7* 41.5*  INR 4.94* 4.25*   Dg Esophagus  02/11/2012  *RADIOLOGY REPORT*  Clinical Data: Difficulty swallowing.  Cough.  ESOPHOGRAM/BARIUM SWALLOW  Technique:  Single contrast examination was performed using thin barium.  Fluoroscopy time:  0.5 minutes.  Comparison:  None.   Findings:  There is marked stasis of barium in the vallecula or the piriform sinuses.  There was difficulty positioning the patient given his limited mobility.  Only small amount of barium passed into the esophagus.  Given marked stasis in the valleculae or piriform sinuses, the examination was terminated due to the risk of aspiration.  IMPRESSION: Very limited study demonstrating marked stasis of barium in the valleculae or piriform sinuses.  Speech therapy consultation is recommended to evaluate for potential aspiration.   Original Report Authenticated By: Bernadene Bell. Maricela Curet, M.D.    Dg Chest Port 1 View  02/11/2012  *RADIOLOGY REPORT*  Clinical Data: Hypoxia  PORTABLE CHEST - 1 VIEW  Comparison: Chest x-ray 02/08/2012.  Findings: Lung volumes are normal.  Bibasilar opacities may represent areas of atelectasis and/or consolidation, likely with superimposed small bilateral pleural effusions (right greater than left).  There is some cephalization of the pulmonary vasculature and indistinct interstitial markings, suggesting mild interstitial pulmonary edema.  In addition, however, there is more focal airspace disease, particularly in the right mid and lower lung, and the left base.  Mild cardiomegaly, increased compared to the prior examination. The patient is rotated to the right on today's exam, resulting in distortion of the mediastinal contours and reduced diagnostic sensitivity and specificity for mediastinal pathology. Atherosclerosis of the thoracic aorta.  Status post median sternotomy for CABG.  IMPRESSION: 1.  The appearance of the chest suggests mild congestive heart failure, as above. 2.  In addition, there is more focal airspace  consolidation in the right mid and lower lung, and at the left base, concerning for superimposed infection.  Alternatively, this pattern could be seen in the setting of recent aspiration.  Clinical correlation is recommended. 3.  Atherosclerosis. 4.  Status post CABG.    Original Report Authenticated By: Florencia Reasons, M.D.     Assessment / Plan: -Progressive dysphagia to solids and liquids-now with probable aspiration PNA and receiving abx.  Once again, would have speech reevaluate; will follow-up results of that.  EGD not ideal at this time due to patient's recent events and high INR.  ? Need for PEG in the future even if temporary until patient becomes stronger and swallowing possibly improves.  -Supratherapeutic INR-has not received coumadin since his admission and INR even higher today. ? Component of malnutrition.   -Diarrhea-resolved   -Anemia-s/p 2 units PRBC's on 9/1 with stable Hgb since that time. Was evaluated extensively with his routine GI physician as an outpatient previously. No evidence of GIB currently. To see heme/onc as well.   LOS: 5 days   ZEHR, JESSICA D.  02/12/2012, 8:54 AM  Pager number [phone]    ________________________________________________________________________  [facility] MD note:  I personally examined the patient, reviewed the data.  He is breathing very well this AM.  INR 5, going up despite holding coumadin.  His swallowing is clearly a problem for him.  Coughs with any food, liquids.  Previous MBS has also suggested an esohpageal component but clinically I'm not so sure (has pretty rare dysphagia).  I am going to proceed with diagnostic EGD this AM.  Perhaps he has candida infection, perhaps malignancy.  Will elevated INR, he understands I cannot dilate.   Rob Bunting, MD [facility] Pager [phone]

## 2012-02-12 NOTE — Progress Notes (Signed)
Physical Therapy Treatment Patient Details Name: [name] MRN: [id number] DOB: [birth year] Today's Date: 02/12/2012 Time: 4098-1191 PT Time Calculation (min): 24 min  PT Assessment / Plan / Recommendation Comments on Treatment Session  Pt admitted s/p SOB with increased INR.  Pt able to tolerate sit to stand trials today with increased independence.  Motivated.    Follow Up Recommendations  Skilled nursing facility    Barriers to Discharge        Equipment Recommendations  Defer to next venue    Recommendations for Other Services    Frequency Min 3X/week   Plan Discharge plan remains appropriate;Frequency remains appropriate    Precautions / Restrictions Precautions Precautions: Fall Restrictions Weight Bearing Restrictions: No   Pertinent Vitals/Pain None    Mobility  Bed Mobility Bed Mobility: Supine to Sit;Sit to Supine Supine to Sit: 1: +2 Total assist;HOB flat Supine to Sit: Patient Percentage: 60% Sit to Supine: 1: +2 Total assist;HOB flat Sit to Supine: Patient Percentage: 60% Details for Bed Mobility Assistance: Assist to facilitate rotation of hips to EOB as well as translate trunk anterior/slow descent to surface.  Cues for sequence. Transfers Transfers: Sit to Stand;Stand to Sit (3 trials.) Sit to Stand: 1: +2 Total assist;With upper extremity assist;From bed Sit to Stand: Patient Percentage: 30% Stand to Sit: 1: +2 Total assist;With upper extremity assist;To bed Stand to Sit: Patient Percentage: 30% Details for Transfer Assistance: Assist to translate trunk anterior over BOS while blocking left LE at knee/foot to prevent buckling/sliding.  Facilitation at hips to extend as well as trunk.  Cues for safest sequence. Ambulation/Gait Ambulation/Gait Assistance: Not tested (comment) Stairs: No Wheelchair Mobility Wheelchair Mobility: No    Exercises General Exercises - Lower Extremity Ankle Circles/Pumps: AROM;Left;10 reps;Supine Quad Sets: AROM;Both;10  reps;Supine Heel Slides: AROM;Both;10 reps;Supine Hip ABduction/ADduction: AAROM;Both;10 reps;Supine Straight Leg Raises: AAROM;Both;10 reps;Supine   PT Diagnosis:    PT Problem List:   PT Treatment Interventions:     PT Goals Acute Rehab PT Goals PT Goal Formulation: With patient Time For Goal Achievement: 02/24/12 Potential to Achieve Goals: Fair PT Goal: Supine/Side to Sit - Progress: Progressing toward goal PT Goal: Sit to Supine/Side - Progress: Progressing toward goal  Visit Information  Last PT Received On: 02/12/12 Assistance Needed: +2    Subjective Data  Subjective: "I haven't eaten since yesterday, but I will give it my best." Patient Stated Goal: None stated.     Cognition  Overall Cognitive Status: Impaired Area of Impairment: Memory Arousal/Alertness: Awake/alert Orientation Level: Disoriented to;Time Behavior During Session: WFL for tasks performed    Balance  Balance Balance Assessed: No  End of Session PT - End of Session Equipment Utilized During Treatment: Gait belt Activity Tolerance: Patient tolerated treatment well Patient left: in bed;with call bell/phone within reach;with bed alarm set;with family/visitor present Nurse Communication: Mobility status   GP     Cephus Shelling 02/12/2012, 10:24 AM  02/12/2012 Cephus Shelling, PT, DPT [phone]

## 2012-02-12 NOTE — Progress Notes (Signed)
OT Cancellation Note  Treatment cancelled today due to:  Pt currently off the unit at [facility]. OT to reattempt at a later date/ time. Ot to follow acutely   Lucile Shutters   OTR/L Pager: [phone] Office: [phone] .

## 2012-02-13 ENCOUNTER — Encounter (HOSPITAL_COMMUNITY): Payer: Self-pay

## 2012-02-13 ENCOUNTER — Inpatient Hospital Stay (HOSPITAL_COMMUNITY): Payer: Medicare Other

## 2012-02-13 ENCOUNTER — Encounter (HOSPITAL_COMMUNITY): Payer: Self-pay | Admitting: Gastroenterology

## 2012-02-13 DIAGNOSIS — I509 Heart failure, unspecified: Secondary | ICD-10-CM

## 2012-02-13 DIAGNOSIS — I5031 Acute diastolic (congestive) heart failure: Secondary | ICD-10-CM | POA: Diagnosis present

## 2012-02-13 DIAGNOSIS — J690 Pneumonitis due to inhalation of food and vomit: Secondary | ICD-10-CM

## 2012-02-13 LAB — STOOL CULTURE

## 2012-02-13 LAB — BASIC METABOLIC PANEL
BUN: 19 mg/dL (ref 6–23)
CO2: 28 mEq/L (ref 19–32)
Glucose, Bld: 97 mg/dL (ref 70–99)
Potassium: 4 mEq/L (ref 3.5–5.1)
Sodium: 141 mEq/L (ref 135–145)

## 2012-02-13 LAB — CBC
HCT: 27.5 % — ABNORMAL LOW (ref 39.0–52.0)
Hemoglobin: 8.9 g/dL — ABNORMAL LOW (ref 13.0–17.0)
RBC: 3.19 MIL/uL — ABNORMAL LOW (ref 4.22–5.81)

## 2012-02-13 LAB — GLUCOSE, CAPILLARY
Glucose-Capillary: 103 mg/dL — ABNORMAL HIGH (ref 70–99)
Glucose-Capillary: 120 mg/dL — ABNORMAL HIGH (ref 70–99)
Glucose-Capillary: 124 mg/dL — ABNORMAL HIGH (ref 70–99)
Glucose-Capillary: 151 mg/dL — ABNORMAL HIGH (ref 70–99)

## 2012-02-13 LAB — VANCOMYCIN, TROUGH: Vancomycin Tr: 18.9 ug/mL (ref 10.0–20.0)

## 2012-02-13 MED ORDER — RESOURCE THICKENUP CLEAR PO POWD
ORAL | Status: DC | PRN
  Filled 2012-02-13: qty 125

## 2012-02-13 MED ORDER — DEXTROSE 5 % IV SOLN
1.0000 g | Freq: Two times a day (BID) | INTRAVENOUS | Status: DC
  Administered 2012-02-13 – 2012-02-19 (×13): 1 g via INTRAVENOUS
  Filled 2012-02-13 (×14): qty 1

## 2012-02-13 MED ORDER — FUROSEMIDE 10 MG/ML IJ SOLN
40.0000 mg | Freq: Three times a day (TID) | INTRAMUSCULAR | Status: DC
  Administered 2012-02-13 – 2012-02-19 (×20): 40 mg via INTRAVENOUS
  Filled 2012-02-13 (×20): qty 4

## 2012-02-13 MED ORDER — ENSURE PUDDING PO PUDG
1.0000 | Freq: Three times a day (TID) | ORAL | Status: DC
  Administered 2012-02-13 – 2012-02-18 (×9): 1 via ORAL

## 2012-02-13 MED ORDER — PHYTONADIONE 5 MG PO TABS
5.0000 mg | ORAL_TABLET | Freq: Once | ORAL | Status: AC
  Administered 2012-02-13: 5 mg via ORAL
  Filled 2012-02-13: qty 1

## 2012-02-13 MED ORDER — SODIUM CHLORIDE 0.9 % IJ SOLN
10.0000 mL | INTRAMUSCULAR | Status: DC | PRN
  Administered 2012-02-14 – 2012-02-19 (×4): 10 mL

## 2012-02-13 NOTE — Progress Notes (Signed)
Nutrition Follow-up  Intervention:    Ensure Pudding 3 times daily between meals (170 kcals, 4 gm protein per 4 oz cup) RD to follow for nutrition care plan  Assessment:   S/p bedside swallow evaluation 9/2, 9/4. MBSS completed this AM -- presented with moderate pharyngeal phase dysphagia. PO intake variable at 0-80% per flowsheet records. Diarrhea resolved. Amenable to Ensure Pudding supplements.  Diet Order:  Dysphagia 3, nectar thick liquids  Meds: Scheduled Meds:   . atorvastatin  40 mg Oral QHS  . cefTAZidime (FORTAZ)  IV  1 g Intravenous Q12H  . furosemide  40 mg Intravenous Q8H  . insulin aspart  0-9 Units Subcutaneous TID WC & HS  . levalbuterol  0.63 mg Nebulization Q6H  . magnesium oxide  400 mg Oral Daily  . OLANZapine  5 mg Oral QHS  . olanzapine-FLUoxetine  1 capsule Oral QHS  . pantoprazole  40 mg Oral Q1200  . phytonadione  5 mg Oral Once  . sodium chloride  3 mL Intravenous Q12H  . vancomycin  500 mg Intravenous Q12H  . DISCONTD: cefTAZidime (FORTAZ)  IV  2 g Intravenous Q12H  . DISCONTD: furosemide  40 mg Intravenous Q12H   Continuous Infusions:  PRN Meds:.acetaminophen, acetaminophen, albuterol, alum & mag hydroxide-simeth, guaiFENesin-dextromethorphan, HYDROcodone-acetaminophen, naloxone, ondansetron (ZOFRAN) IV, ondansetron, RESOURCE THICKENUP CLEAR  Labs:  CMP     Component Value Date/Time   NA 141 02/13/2012 0520   K 4.0 02/13/2012 0520   CL 105 02/13/2012 0520   CO2 28 02/13/2012 0520   GLUCOSE 97 02/13/2012 0520   BUN 19 02/13/2012 0520   CREATININE 1.30 02/13/2012 0520   CREATININE 0.96 04/03/2011 0857   CALCIUM 8.1* 02/13/2012 0520   PROT 6.5 02/08/2012 0350   ALBUMIN 1.7* 02/08/2012 0350   AST 11 02/08/2012 0350   ALT 7 02/08/2012 0350   ALKPHOS 78 02/08/2012 0350   BILITOT 0.2* 02/08/2012 0350   GFRNONAA 51* 02/13/2012 0520   GFRAA 59* 02/13/2012 0520     Intake/Output Summary (Last 24 hours) at 02/13/12 1506 Last data filed at 02/13/12 1332  Gross per 24  hour  Intake     50 ml  Output   3300 ml  Net  -3250 ml    CBG (last 3)   Basename 02/13/12 1120 02/13/12 0728 02/12/12 2107  GLUCAP 120* 103* 120*    Weight Status:  56.9 kg (9/1) -- stable  Re-estimated needs:  1925-2200 kcals, 85-100 gm protein  Nutrition Dx:  Inadequate Oral Intake, ongoing  Goal:  Oral intake with meals & supplements to meet >/= 90% of estimated nutrition needs  Monitor:  PO & supplemental intake, weight, labs, I/O's  Kirkland Hun, RD, LDN Pager #: [phone] After-Hours Pager #: [phone]

## 2012-02-13 NOTE — Progress Notes (Signed)
OT Cancellation Note  Treatment cancelled today due to medical issues with patient which prohibited therapy (INR > 6.0 unsteady balance and treading upward with INR value from 02/13/12).  Harrel Carina Fontana Pager: [phone]  02/13/2012, 8:14 AM

## 2012-02-13 NOTE — Progress Notes (Signed)
ANTICOAGULATION and ANTIBIOTIC CONSULT NOTE - Follow Up Consult  Pharmacy Consult for Coumadin, vancomycin, and  ceftazidime Indication: atrial fibrillation  Allergies  Allergen Reactions  . Levaquin (Levofloxacin) Rash  . Morphine And Related Other (See Comments)    Confusion, agitation  . Codeine Nausea And Vomiting    Noted on MAR  . Penicillins Rash    Noted on [facility]    Patient Measurements: Height: 5\' 9"  (175.3 cm) Weight: 125 lb 8 oz (56.926 kg) IBW/kg (Calculated) : 70.7    Vital Signs: Temp: 97.8 F (36.6 C) (09/05 0428) Temp src: Oral (09/05 0428) BP: 123/59 mmHg (09/05 0428) Pulse Rate: 71  (09/05 0428)  Labs:  Basename 02/13/12 0520 02/12/12 0520 02/11/12 0525  HGB 8.9* 9.3* --  HCT 27.5* 28.2* 28.6*  PLT 259 255 182  APTT -- -- --  LABPROT 57.0* 46.7* 41.5*  INR 6.38* 4.94* 4.25*  HEPARINUNFRC -- -- --  CREATININE 1.30 1.26 1.05  CKTOTAL -- -- --  CKMB -- -- --  TROPONINI -- -- --    Estimated Creatinine Clearance: 38.3 ml/min (by C-G formula based on Cr of 1.3).  Assessment: Patient is a 78 y.o. M on coumadin for Afib.  INR continues to trend up despite coumadin being on hold.  Vitamin K 5mg  PO x1 ordered for this morning (per MD) for INR of 6.38. Not certain at this point as to reason why INR value is elevated. No bleeding noted.  Patient is currently on vancomycin and ceftazidime day #3 for Ecoli UTI and suspected PNA.  Blood cultures are still NGTD.  He is afebrile and WBC is trending down to 11.4 today.  SCr continues to trend up.   Goal of Therapy:  INR 2-3; vancomycin trough level = 15-20   Plan:  1) Continue to hold coumadin  2) check vancomycin level prior to the 6 PM dose today d/t changing renal function 3) change Ceftazidime to 1gm IV q12h  Isha Seefeld P 02/13/2012,8:24 AM

## 2012-02-13 NOTE — Progress Notes (Signed)
Received critical value for an INR of 6.38 and a PT of 57.  PA on call made aware. New order received. Will cont to monitor pt.

## 2012-02-13 NOTE — Progress Notes (Signed)
ANTIBIOTIC CONSULT NOTE - FOLLOW UP  Pharmacy Consult for Vancomycin Indication: rule out pneumonia  Allergies  Allergen Reactions  . Levaquin (Levofloxacin) Rash  . Morphine And Related Other (See Comments)    Confusion, agitation  . Codeine Nausea And Vomiting    Noted on MAR  . Penicillins Rash    Noted on [facility]    Patient Measurements: Height: 5\' 9"  (175.3 cm) Weight: 125 lb 8 oz (56.926 kg) IBW/kg (Calculated) : 70.7   Vital Signs: Temp: 98.1 F (36.7 C) (09/05 1737) Temp src: Oral (09/05 1737) BP: 106/51 mmHg (09/05 1737) Pulse Rate: 89  (09/05 1737) Intake/Output from previous day: 09/04 0701 - 09/05 0700 In: 50 [IV Piggyback:50] Out: 4400 [Urine:4400] Intake/Output from this shift: Total I/O In: 3 [I.V.:3] Out: 900 [Urine:900]  Labs:  [facility] 02/13/12 0520 02/12/12 0520 02/11/12 0525  WBC 11.4* 16.5* 12.8*  HGB 8.9* 9.3* 9.2*  PLT 259 255 182  LABCREA -- -- --  CREATININE 1.30 1.26 1.05   Estimated Creatinine Clearance: 38.3 ml/min (by C-G formula based on Cr of 1.3).  Basename 02/13/12 1701  VANCOTROUGH 18.9  VANCOPEAK --  VANCORANDOM --  GENTTROUGH --  GENTPEAK --  GENTRANDOM --  TOBRATROUGH --  TOBRAPEAK --  TOBRARND --  AMIKACINPEAK --  AMIKACINTROU --  AMIKACIN --     Microbiology: Recent Results (from the past 720 hour(s))  STOOL CULTURE     Status: Normal   Collection Time   01/28/12  3:07 PM      Component Value Range Status Comment   Organism ID, Bacteria No Salmonella,Shigella,Campylobacter,Yersinia,or   Final    Organism ID, Bacteria No E.coli [id number]:[id number] isolated.   Final   MRSA PCR SCREENING     Status: Normal   Collection Time   02/08/12  2:49 AM      Component Value Range Status Comment   MRSA by PCR NEGATIVE  NEGATIVE Final   URINE CULTURE     Status: Normal   Collection Time   02/08/12  4:15 AM      Component Value Range Status Comment   Specimen Description URINE, SUPRAPUBIC   Final    Special Requests NONE   Final    Culture  Setup Time 02/08/2012 11:21   Final    Colony Count >=100,000 COLONIES/ML   Final    Culture ESCHERICHIA COLI   Final    Report Status 02/10/2012 FINAL   Final    Organism ID, Bacteria ESCHERICHIA COLI   Final   CLOSTRIDIUM DIFFICILE BY PCR     Status: Normal   Collection Time   02/09/12  2:58 PM      Component Value Range Status Comment   C difficile by pcr NEGATIVE  NEGATIVE Final   STOOL CULTURE     Status: Normal   Collection Time   02/09/12  2:58 PM      Component Value Range Status Comment   Specimen Description STOOL   Final    Special Requests NONE   Final    Culture     Final    Value: NO SALMONELLA, SHIGELLA, CAMPYLOBACTER, YERSINIA, OR E.COLI 0157:H7 ISOLATED   Report Status 02/13/2012 FINAL   Final   CULTURE, BLOOD (ROUTINE X 2)     Status: Normal (Preliminary result)   Collection Time   02/11/12  4:57 PM      Component Value Range Status Comment   Specimen Description BLOOD RIGHT ARM   Final    Special Requests  BOTTLES DRAWN AEROBIC AND ANAEROBIC 10CC   Final    Culture  Setup Time 02/11/2012 20:39   Final    Culture     Final    Value:        BLOOD CULTURE RECEIVED NO GROWTH TO DATE CULTURE WILL BE HELD FOR 5 DAYS BEFORE ISSUING A FINAL NEGATIVE REPORT   Report Status PENDING   Incomplete   CULTURE, BLOOD (ROUTINE X 2)     Status: Normal (Preliminary result)   Collection Time   02/11/12  5:03 PM      Component Value Range Status Comment   Specimen Description BLOOD RIGHT HAND   Final    Special Requests BOTTLES DRAWN AEROBIC AND ANAEROBIC 10CC   Final    Culture  Setup Time 02/11/2012 20:39   Final    Culture     Final    Value:        BLOOD CULTURE RECEIVED NO GROWTH TO DATE CULTURE WILL BE HELD FOR 5 DAYS BEFORE ISSUING A FINAL NEGATIVE REPORT   Report Status PENDING   Incomplete     Anti-infectives     Start     Dose/Rate Route Frequency Ordered Stop   02/13/12 1800   cefTAZidime (FORTAZ) 1 g in dextrose 5 % 50 mL IVPB        1 g 100 mL/hr over 30  Minutes Intravenous Every 12 hours 02/13/12 0848     02/11/12 1800   vancomycin (VANCOCIN) 500 mg in sodium chloride 0.9 % 100 mL IVPB        500 mg 100 mL/hr over 60 Minutes Intravenous Every 12 hours 02/11/12 1709     02/11/12 1800   cefTAZidime (FORTAZ) 2 g in dextrose 5 % 50 mL IVPB  Status:  Discontinued        2 g 100 mL/hr over 30 Minutes Intravenous Every 12 hours 02/11/12 1709 02/13/12 0845   02/11/12 1630   piperacillin-tazobactam (ZOSYN) IVPB 3.375 g  Status:  Discontinued        3.375 g 100 mL/hr over 30 Minutes Intravenous STAT 02/11/12 1628 02/11/12 1638          Assessment: 78 y.o. M on Vancomycin + Ceftazidime for E.coli UTI and empiric PNA coverage. Vancomycin level this evening is therapeutic (Vanc level 18.9, goal of 15-20). Renal function has worsened slightly since starting Vancomycin (SCr 1.3 << 1.14) -- but no dose adjustments are required at this time. Will continue to monitor closely.   Goal of Therapy:  Vancomycin trough level 15-20 mcg/ml  Plan:  1. Continue Vancomycin 500 mg IV every 12 hours 2. Will continue to follow renal function, culture results, LOT, and antibiotic de-escalation plans   Georgina Pillion, PharmD, BCPS Clinical Pharmacist Pager: [phone] 02/13/2012 6:27 PM

## 2012-02-13 NOTE — Progress Notes (Signed)
[facility] Progress Note  HPI: [name] is a 79/M , with multiple co-morbidities who presented with  He has had diarrhea for 2-3 weeks ever since he left nursing home. 4-5 BM a day , for the past one week it has become severe. He is getting very weak and unable to walk or even transfer to the whealchair. His home health nurse called his primary care doctor and he was sent to emergency department. in ED He was found to be severely dehydrated and hypotensive. Denies any blood in the stool it is very watery no improvement with imodium.  He has indwelling catheter since 2006 since prostate cancer. 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  Subsequently his diarrhea/hypotension and ARF have resolved with IVF and received PRBC transfusion too, he was also getting evaluated for dysphagia   Assessment/Plan:  Main Issue: Acute diastolic heart failure: Cause of INR being up. Will increase diuresis  Aspiration PNA: Day 3 IV abx.  WBC ct near normal.  Given allergies, I spoke to pharmacy and they are recommending that he'll need to continue IV antibiotics for his full course. Fortunately with dietary strictures, pneumonia is responding to antibiotics.  1. Diarrhea - resolved, possibly viral gastroenteritis appears to have run its course , DC IVF, Stool Cx negative & C.difficile PCR -negative    2. DIABETES MELLITUS, TYPE II - sliding scale   3.. Atrial fibrillation - metoprolol and digoxin on hold given bradycardia, HR better as digoxin gets out of system, hold warfarin, INR continues to trend up because of volume overload  4 . Hypotension/ Dehydration - secondary to volume depletion, diarrhea, improved, stopped IV fluids and monitor her urine output   5. Anemia-acute on chronic - anemia panel unremarkable, suspect chronic disease and worsened by acute illness, last EGD/Colonoscopy and capsule endoscopy 2 years ago, unable to find source of blood loss per wife    Transfused 2units PRBC 9/1, FU with hematology as outpatient  No overt blood loss   5. Hypocalcemia - will replace   6. Acute renal failure - - improved, stopped IVF   7. Coagulopathy: secondary to malnutrition/diarrhea/Vit K defi and coumadin, received Vit K on admission, continue to hold coumadin   8. Debility - s/p Pt/OT eval, decline SNF although may need  9. Dysphagia with persistent coughing with meals: s/p Swallow eval now and MBS in 5/23 with esophageal and pharyngeal dysphagia  Achalasia seen with endoscopy and repeat swallow they'll now has patient with dysphagia 3 mechanical soft diet with nectar thick liquids  10. Asymptomatic bacteruria:  Urine cultures with [facility] but patient completely asymptomatic from this at this time, hence did not treat.   Prophylaxis:coagulopathic/ Protonix   CODE STATUS: DNR/DNI as per patient and family wishes  Ambulate, PT eval  Pt and wife decline SNF, home with [facility] services in the morning if white blood cell count improving and INR reasonable.   LOS: 6 days   [facility]  Pager: [phone]  02/11/2012, 12:59 PM    Subjective: Feels better, no further diarrhea , formed stool yetserday,  breathing ok, ate breakfast, choking/coughing with meals  Objective: Vital signs in last 24 hours: Temp:  [97.5 F (36.4 C)-98 F (36.7 C)] 97.8 F (36.6 C) (09/05 0428) Pulse Rate:  [71-96] 71  (09/05 0428) Resp:  [18-20] 20  (09/05 0428) BP: (95-123)/(59-65) 120/65 mmHg (09/05 1336) SpO2:  [96 %-100 %] 97 % (09/05 0815) Weight change:  Last BM Date: 02/12/12  Intake/Output from previous day: 09/04 0701 - 09/05 0700 In: 50 [IV Piggyback:50] Out: 4400 [Urine:4400] Total I/O In: -  Out: 900 [Urine:900]   Physical Exam: General: Alert, awake, oriented x3, in no acute distress. HEENT: No bruits, no goiter. Heart: Regular rate and rhythm, without murmurs, rubs, gallops. Lungs: faint basilar crackles Abdomen: Soft, nontender,  nondistended, positive bowel sounds. Extremities: L BKA, small opening of sutures at medial end Neuro: Grossly intact, nonfocal.    Lab Results: Basic Metabolic Panel:  Basename 02/13/12 0520 02/12/12 0520  NA 141 138  K 4.0 4.6  CL 105 107  CO2 28 22  GLUCOSE 97 114*  BUN 19 19  CREATININE 1.30 1.26  CALCIUM 8.1* 7.5*  MG -- --  PHOS -- --   Liver Function Tests: No results found for this basename: AST:2,ALT:2,ALKPHOS:2,BILITOT:2,PROT:2,ALBUMIN:2 in the last 72 hours No results found for this basename: LIPASE:2,AMYLASE:2 in the last 72 hours No results found for this basename: AMMONIA:2 in the last 72 hours CBC:  Basename 02/13/12 0520 02/12/12 0520  WBC 11.4* 16.5*  NEUTROABS -- --  HGB 8.9* 9.3*  HCT 27.5* 28.2*  MCV 86.2 86.2  PLT 259 255   Cardiac Enzymes: No results found for this basename: CKTOTAL:3,CKMB:3,CKMBINDEX:3,TROPONINI:3 in the last 72 hours BNP:  Basename 02/12/12 0945  PROBNP 35772.0*   D-Dimer: No results found for this basename: DDIMER:2 in the last 72 hours CBG:  Basename 02/13/12 1120 02/13/12 0728 02/12/12 2107 02/12/12 1713 02/12/12 1440 02/12/12 1238  GLUCAP 120* 103* 120* 99 76 76   Hemoglobin A1C: No results found for this basename: HGBA1C in the last 72 hours Fasting Lipid Panel: No results found for this basename: CHOL,HDL,LDLCALC,TRIG,CHOLHDL,LDLDIRECT in the last 72 hours Thyroid Function Tests: No results found for this basename: TSH,T4TOTAL,FREET4,T3FREE,THYROIDAB in the last 72 hours Anemia Panel: No results found for this basename: VITAMINB12,FOLATE,FERRITIN,TIBC,IRON,RETICCTPCT in the last 72 hours Coagulation:  Basename 02/13/12 0520 02/12/12 0520  LABPROT 57.0* 46.7*  INR 6.38* 4.94*   Urine Drug Screen: Drugs of Abuse  No results found for this basename: labopia,  cocainscrnur,  labbenz,  amphetmu,  thcu,  labbarb    Alcohol Level: No results found for this basename: ETH:2 in the last 72 hours Urinalysis: No  results found for this basename: COLORURINE:2,APPERANCEUR:2,LABSPEC:2,PHURINE:2,GLUCOSEU:2,HGBUR:2,BILIRUBINUR:2,KETONESUR:2,PROTEINUR:2,UROBILINOGEN:2,NITRITE:2,LEUKOCYTESUR:2 in the last 72 hours Recent Results (from the past 240 hour(s))  MRSA PCR SCREENING     Status: Normal   Collection Time   02/08/12  2:49 AM      Component Value Range Status Comment   MRSA by PCR NEGATIVE  NEGATIVE Final   URINE CULTURE     Status: Normal   Collection Time   02/08/12  4:15 AM      Component Value Range Status Comment   Specimen Description URINE, SUPRAPUBIC   Final    Special Requests NONE   Final    Culture  Setup Time 02/08/2012 11:21   Final    Colony Count >=100,000 COLONIES/ML   Final    Culture ESCHERICHIA COLI   Final    Report Status 02/10/2012 FINAL   Final    Organism ID, Bacteria ESCHERICHIA COLI   Final   CLOSTRIDIUM DIFFICILE BY PCR     Status: Normal   Collection Time   02/09/12  2:58 PM      Component Value Range Status Comment   C difficile by pcr NEGATIVE  NEGATIVE Final   STOOL CULTURE     Status: Normal   Collection Time  02/09/12  2:58 PM      Component Value Range Status Comment   Specimen Description STOOL   Final    Special Requests NONE   Final    Culture     Final    Value: NO SALMONELLA, SHIGELLA, CAMPYLOBACTER, YERSINIA, OR E.COLI 0157:H7 ISOLATED   Report Status 02/13/2012 FINAL   Final   CULTURE, BLOOD (ROUTINE X 2)     Status: Normal (Preliminary result)   Collection Time   02/11/12  4:57 PM      Component Value Range Status Comment   Specimen Description BLOOD RIGHT ARM   Final    Special Requests BOTTLES DRAWN AEROBIC AND ANAEROBIC 10CC   Final    Culture  Setup Time 02/11/2012 20:39   Final    Culture     Final    Value:        BLOOD CULTURE RECEIVED NO GROWTH TO DATE CULTURE WILL BE HELD FOR 5 DAYS BEFORE ISSUING A FINAL NEGATIVE REPORT   Report Status PENDING   Incomplete   CULTURE, BLOOD (ROUTINE X 2)     Status: Normal (Preliminary result)   Collection  Time   02/11/12  5:03 PM      Component Value Range Status Comment   Specimen Description BLOOD RIGHT HAND   Final    Special Requests BOTTLES DRAWN AEROBIC AND ANAEROBIC 10CC   Final    Culture  Setup Time 02/11/2012 20:39   Final    Culture     Final    Value:        BLOOD CULTURE RECEIVED NO GROWTH TO DATE CULTURE WILL BE HELD FOR 5 DAYS BEFORE ISSUING A FINAL NEGATIVE REPORT   Report Status PENDING   Incomplete     Studies/Results: Dg Esophagus  02/11/2012  *RADIOLOGY REPORT*  Clinical Data: Difficulty swallowing.  Cough.  ESOPHOGRAM/BARIUM SWALLOW  Technique:  Single contrast examination was performed using thin barium.  Fluoroscopy time:  0.5 minutes.  Comparison:  None.  Findings:  There is marked stasis of barium in the vallecula or the piriform sinuses.  There was difficulty positioning the patient given his limited mobility.  Only small amount of barium passed into the esophagus.  Given marked stasis in the valleculae or piriform sinuses, the examination was terminated due to the risk of aspiration.  IMPRESSION: Very limited study demonstrating marked stasis of barium in the valleculae or piriform sinuses.  Speech therapy consultation is recommended to evaluate for potential aspiration.   Original Report Authenticated By: Bernadene Bell. Maricela Curet, M.D.    Dg Chest Port 1 View  02/11/2012  *RADIOLOGY REPORT*  Clinical Data: Hypoxia  PORTABLE CHEST - 1 VIEW  Comparison: Chest x-ray 02/08/2012.  Findings: Lung volumes are normal.  Bibasilar opacities may represent areas of atelectasis and/or consolidation, likely with superimposed small bilateral pleural effusions (right greater than left).  There is some cephalization of the pulmonary vasculature and indistinct interstitial markings, suggesting mild interstitial pulmonary edema.  In addition, however, there is more focal airspace disease, particularly in the right mid and lower lung, and the left base.  Mild cardiomegaly, increased compared to the  prior examination. The patient is rotated to the right on today's exam, resulting in distortion of the mediastinal contours and reduced diagnostic sensitivity and specificity for mediastinal pathology. Atherosclerosis of the thoracic aorta.  Status post median sternotomy for CABG.  IMPRESSION: 1.  The appearance of the chest suggests mild congestive heart failure, as above. 2.  In  addition, there is more focal airspace consolidation in the right mid and lower lung, and at the left base, concerning for superimposed infection.  Alternatively, this pattern could be seen in the setting of recent aspiration.  Clinical correlation is recommended. 3.  Atherosclerosis. 4.  Status post CABG.   Original Report Authenticated By: Florencia Reasons, M.D.     Medications: Scheduled Meds:    . atorvastatin  40 mg Oral QHS  . cefTAZidime (FORTAZ)  IV  1 g Intravenous Q12H  . furosemide  40 mg Intravenous Q8H  . insulin aspart  0-9 Units Subcutaneous TID WC & HS  . levalbuterol  0.63 mg Nebulization Q6H  . magnesium oxide  400 mg Oral Daily  . OLANZapine  5 mg Oral QHS  . olanzapine-FLUoxetine  1 capsule Oral QHS  . pantoprazole  40 mg Oral Q1200  . phytonadione  5 mg Oral Once  . sodium chloride  3 mL Intravenous Q12H  . vancomycin  500 mg Intravenous Q12H  . DISCONTD: cefTAZidime (FORTAZ)  IV  2 g Intravenous Q12H  . DISCONTD: furosemide  40 mg Intravenous Q12H   Continuous Infusions:   PRN Meds:.acetaminophen, acetaminophen, albuterol, alum & mag hydroxide-simeth, guaiFENesin-dextromethorphan, HYDROcodone-acetaminophen, naloxone, ondansetron (ZOFRAN) IV, ondansetron, RESOURCE THICKENUP CLEAR  Assessment/Plan:  1. Diarrhea - resolved, possibly viral gastroenteritis appears to have run its course DC IVF Stool Cx negative C.difficile PCR -negative  Formed stool yesterday  2. DIABETES MELLITUS, TYPE II -  sliding scale  3.. Atrial fibrillation -  metoprolol and digoxin on hold given bradycardia,  HR better as digoxin gets out of system, hold warfarin Appreciate cards input  4 . Hypotension/ Dehydration - secondary to volume depletion, diarrhea, improved, stopped IV fluids and monitor her urine output  5. Anemia-acute on chronic - anemia panel unremarkable, suspect chronic disease and worsened by acute illness, last EGD/Colonoscopy and capsule endoscopy 2 years ago, unable to find source of blood loss per wife Transfused 2units PRBC 9/1, FU with hematology as outpatient No overt blood loss  5. Hypocalcemia - will replace   6. Acute renal failure - - improved, stopped IVF  7. Coagulopathy: secondary to malnutrition/diarrhea/Vit K defi and coumadin, received Vit K on admission, continue to hold coumadin  8. Debility - will have PT OT evaluation. He has home health. His family is weiry about nursing home placement secondary to bad experiences   9. Dysphagia with persistent coughing with meals: s/p Swallow eval now and MBS in 5/23 with esophageal and pharyngeal dysphagia Will get DG esophagus and request GI eval If scheduled for EGD will need FFP to reverse INR Wife unfortunately reluctant to get this worked up as outpatient  10. Asymptomatic bacteruria: Urine cultures with [facility] but patient completely asymptomatic from this at this time, hence did not treat.  Prophylaxis:coagulopathic/ Protonix   CODE STATUS: DNR/DNI as per patient and family wishes  Ambulate, PT eval Pt and wife decline SNF, home with [facility] services after GI eval  Time spent: 45 minutes   LOS: 6 days   [name] [facility] Pager: [phone] 02/13/2012, 1:58 PM

## 2012-02-13 NOTE — Plan of Care (Signed)
Problem: Phase III Progression Outcomes Goal: Voiding independently Outcome: Not Applicable Date Met:  02/13/12 Pt has suprapubic catheter Goal: Foley discontinued Outcome: Not Applicable Date Met:  02/13/12 Pt has suprapubic catheter  Problem: Discharge Progression Outcomes Goal: Discharge plan in place and appropriate Outcome: Progressing Home with [facility]

## 2012-02-13 NOTE — Progress Notes (Signed)
[facility] Progress Note  Subjective:  Feels ok.  Went for MBS this AM but no results in computer yet.  Objective:  Vital signs in last 24 hours: Temp:  [97.5 F (36.4 C)-98.2 F (36.8 C)] 97.8 F (36.6 C) (09/05 0428) Pulse Rate:  [71-96] 71  (09/05 0428) Resp:  [16-77] 20  (09/05 0428) BP: (77-123)/(23-92) 123/59 mmHg (09/05 0428) SpO2:  [96 %-100 %] 97 % (09/05 0815) Last BM Date: 02/12/12 General:   Alert, thin and chronically ill-appearing, in NAD Heart:  Regular rate and rhythm; no murmurs Pulm:  Coarse lung sounds B/L. Abdomen:  Soft, nontender and nondistended. Normal bowel sounds, without guarding, and without rebound.   Extremities:  Without edema.  R BKA with bandage. Neurologic:  Alert and  oriented x4;  grossly normal neurologically. Psych:  Alert and cooperative. Normal mood and affect.  Intake/Output from previous day: 09/04 0701 - 09/05 0700 In: 50 [IV Piggyback:50] Out: 4400 [Urine:4400]  Lab Results:  Basename 02/13/12 0520 02/12/12 0520 02/11/12 0525  WBC 11.4* 16.5* 12.8*  HGB 8.9* 9.3* 9.2*  HCT 27.5* 28.2* 28.6*  PLT 259 255 182   BMET  Basename 02/13/12 0520 02/12/12 0520 02/11/12 0525  NA 141 138 135  K 4.0 4.6 4.7  CL 105 107 106  CO2 28 22 22   GLUCOSE 97 114* 97  BUN 19 19 16   CREATININE 1.30 1.26 1.05  CALCIUM 8.1* 7.5* 7.2*   PT/INR  Basename 02/13/12 0520 02/12/12 0520  LABPROT 57.0* 46.7*  INR 6.38* 4.94*   Assessment / Plan: -Progressive dysphagia to solids and liquids-s/p EGD on 9/4 with possible achalasia; speech to repeated MBS today as well -Supratherapeutic INR-has not received coumadin since his admission and INR even higher today. ? Etiology. -Diarrhea-resolved  -Anemia-s/p 2 units PRBC's on 9/1 with fairly stable Hgb since that time. Was evaluated extensively with his routine GI physician as an outpatient previously. No evidence of GIB currently. To see heme/onc as well.  *Could consider repeat EGD with  Botox injection at the GE junction at some point, however, INR would need to be 1.7 or less-?when to reverse INR. *Follow-up results of MBS   LOS: 6 days   ZEHR, JESSICA D.  02/13/2012, 10:24 AM  Pager number [phone]    ________________________________________________________________________  [facility] MD note:  I personally examined the patient, reviewed the data and agree with the assessment and plan described above.   I reviewed MBS report, mix oropharyngeal and esophageal related swallowing problems.  Botox injection of the GE junction for possible achalasia is the only endoscopic option.  His INR is climbing (up to 6.4 today) and so that option is  not safe currently.  I agree with speech recommendations, helping him to swallow more safely, decreasing aspiration risks.     I also recommended to him that a temporary dobhof feeding tube be placed to get his nutritional status up in the meantime, these can stay in place for several weeks if needed, even used in home setting.  I think he is going to have a difficult time regaining strength, improving his deconditioned state with the restrictions needed for safe oral intake.  He is not sure, but will discuss with his wife.  I will leave reversal of INR to primary team, not clear why it is increasing.   Rob Bunting, MD [facility] Pager [phone]

## 2012-02-13 NOTE — Procedures (Signed)
Objective Swallowing Evaluation: Modified Barium Swallowing Study  Patient Details  Name: [name] MRN: [id number] Date of Birth: 1931-06-13  Today's Date: 02/13/2012 Time: 1191-4782 SLP Time Calculation (min): 20 min  Past Medical History:  Past Medical History  Diagnosis Date  . A-fib   . CAD (coronary artery disease)   . Hyperlipidemia   . Diabetes mellitus   . Hypertension   . Suprapubic catheter   . Peripheral vascular disease   . Arthritis   . Cancer     prostate cancer  . Myocardial infarction    Past Surgical History:  Past Surgical History  Procedure Date  . Prostatectomy     radiation therapy  . Bladder cath   . Laminectomy     lumbar  . Lumbar disc surgery   . 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   Marland Kitchen Umbilical hernia repair 1990's, 2006    umbilical hernia repair in 1990s, super umbilical hernia repaired in 2006  . Coronary artery bypass graft     2008  . Total hip arthroplasty     RT  . Cataract extraction 2012    Left 03/2011, Right 04/2011  . Amputation 12/03/2011    Procedure: AMPUTATION BELOW KNEE;  Surgeon: Sherren Kerns, MD;  Location: [facility] OR;  Service: Vascular;  Laterality: Right;  . Esophagogastroduodenoscopy 02/12/2012    Procedure: ESOPHAGOGASTRODUODENOSCOPY (EGD);  Surgeon: Rachael Fee, MD;  Location: [facility] ENDOSCOPY;  Service: Endoscopy;  Laterality: N/A;   HPI:  SLP re-eval ordered after observed likely aspiration event and change in respiratory status on 9/3. Pt subsequently underwent esophagram which revealed pharyngeal stasis but was limited (due to concerns for aspiration).  EGD 9/4 revealed pooling of secretions in distal esophagus and smooth GE junction partial stenosis; possible achalasia.  Pt presented with symptoms of oropharyngeal dysphagia with signs of aspiration.  Spoke at length with pt and wife this afternoon.  Recommend proceeding with another MBS to determine current status.  Although  aspiration is anticipated, mechanism of dysphagia can be better delineated, and may direct treatment plan for specific rehabilitative measures.  Pending MBS tomorrow, recommend allowing solid foods but holding all liquids since they are source of most difficulty.  Pt and wife agree.     Assessment / Plan / Recommendation Clinical Impression  Dysphagia Diagnosis: Moderate oral phase dysphagia;Moderate pharyngeal phase dysphagia (suspect esophageal component) Clinical impression: Patient presents with a mlitifactorial dysphagia with both oropharyngeal and esophageal components. Oropharyngeal swallow characterized by a delay in swallow initiation and generalized oral and laryngeal weakness resulting in penetration and/or aspiration of thin consistencies prior the the swallow, and an increase in degree of pharyngeal residuals post swallow from previous study, which are penetrated as patient attempts to clear. Small, controlled bolus of soft solids and nectar thick liquids were most effective at preventing deep penetration or aspiration when combinted with immediate throat clear and multiple dry swallows, however risk remains moderately high wtih all pos due to pharyngeal residuals. Although esophageal sweep did not appear to reveal a significant degree of esophageal stasis post swallow (MD not present to confirm), do suspect that esophageal deficits have and are contributing to decreased pharyngeal function. Additionally, patient's loss of functional reserve, or ability to compensate for deficits with acute illness and deconditioning, significantly increase aspiration risk at this time. Recommend modification of diet to compensate for loss in function at this time, wtih close f/u to ensrue use of compensatory strategies and aspiration  precautiosn which will be necessary to decrease risks. Hopeful that if patient can decrease aspiration episodes and regain strength, diet advancement may be possible, however some  degree of dysphagia likely chronic in nature.     Treatment Recommendation  Therapy as outlined in treatment plan below    Diet Recommendation Dysphagia 3 (Mechanical Soft);Nectar-thick liquid   Liquid Administration via: Spoon Medication Administration: Crushed with puree Supervision: Patient able to self feed;Full supervision/cueing for compensatory strategies Compensations: Slow rate;Small sips/bites;Multiple dry swallows after each bite/sip (clear throat post swallow and re-swallow) Postural Changes and/or Swallow Maneuvers: Out of bed for meals;Seated upright 90 degrees;Upright 30-60 min after meal    Other  Recommendations Oral Care Recommendations: Oral care BID Other Recommendations: Order thickener from pharmacy;Prohibited food (jello, ice cream, thin soups);Remove water pitcher   Follow Up Recommendations  Outpatient SLP    Frequency and Duration min 3x week  2 weeks   Pertinent Vitals/Pain N/a    SLP Swallow Goals Patient will consume recommended diet without observed clinical signs of aspiration with: Minimal assistance Swallow Study Goal #1 - Progress: Not Met Patient will utilize recommended strategies during swallow to increase swallowing safety with: Minimal assistance Swallow Study Goal #2 - Progress: Not met   General HPI: SLP re-eval ordered after observed likely aspiration event and change in respiratory status on 9/3. Pt subsequently underwent esophagram which revealed pharyngeal stasis but was limited (due to concerns for aspiration).  EGD 9/4 revealed pooling of secretions in distal esophagus and smooth GE junction partial stenosis; possible achalasia.  Pt presented with symptoms of oropharyngeal dysphagia with signs of aspiration.  Spoke at length with pt and wife this afternoon.  Recommend proceeding with another MBS to determine current status.  Although aspiration is anticipated, mechanism of dysphagia can be better delineated, and may direct treatment plan  for specific rehabilitative measures.  Pending MBS tomorrow, recommend allowing solid foods but holding all liquids since they are source of most difficulty.  Pt and wife agree. Type of Study: Modified Barium Swallowing Study Reason for Referral: Objectively evaluate swallowing function Previous Swallow Assessment: MBS 10/31/11: Suspected primary esophageal dysphagia;Moderate pharyngeal phase dysphagia. Esophageal stasis noted throughout mid-lower esophagus upon screen; backflow of POs into pharynx.  Recs at that time for Dysphagia 3, thin liquids with esophageal precautions. Diet Prior to this Study: Dysphagia 3 (soft) (no liquids) Temperature Spikes Noted: Yes Respiratory Status: Supplemental O2 delivered via (comment) (2 liters nasal cannula) History of Recent Intubation: No Behavior/Cognition: Alert;Cooperative Oral Cavity - Dentition: Dentures, top;Dentures, bottom Oral Motor / Sensory Function: Within functional limits Self-Feeding Abilities: Able to feed self Patient Positioning: Upright in chair Baseline Vocal Quality: Wet (indicative of penetration and/or aspiration of secretions) Volitional Cough: Strong Volitional Swallow: Able to elicit Anatomy: Within functional limits Pharyngeal Secretions: Not observed secondary MBS (however suspect present due to wet vocal quality)    Reason for Referral Objectively evaluate swallowing function      Pharyngeal Phase Pharyngeal Phase: Impaired   Cervical Esophageal Phase    GO   Ferdinand Lango MA, CCC-SLP [phone]  Cervical Esophageal Phase: Impaired    Evangelynn Lochridge Meryl 02/13/2012, 11:34 AM

## 2012-02-13 NOTE — Progress Notes (Signed)
Speech Language Pathology Dysphagia Treatment Patient Details Name: [name] MRN: [id number] DOB: 17-Apr-1932 Today's Date: 02/13/2012 Time: 4098-1191 SLP Time Calculation (min): 10 min  Assessment / Plan / Recommendation Clinical Impression  thorough education complete with patient and wife following MBS regarding results and recommendations. Patient and wife verbalized understanding that current recommendations are to decrease but not eliminate risk of aspiration given severity of dysphagia. Educdation complete with RN as well. Signs placed at head of bed and within patient's site and diet adjusted in CHL. SLP will f/u closely    Diet Recommendation  Initiate / Change Diet: Dysphagia 3 (mechanical soft);Nectar-thick liquid    SLP Plan Continue with current plan of care   Pertinent Vitals/Pain n/a   Swallowing Goals  SLP Swallowing Goals Patient will consume recommended diet without observed clinical signs of aspiration with: Minimal assistance Swallow Study Goal #1 - Progress: Not Met Patient will utilize recommended strategies during swallow to increase swallowing safety with: Minimal assistance Swallow Study Goal #2 - Progress: Not met  General Temperature Spikes Noted: Yes Respiratory Status: Supplemental O2 delivered via (comment) (2 liters nasal cannula) Behavior/Cognition: Alert;Cooperative Oral Cavity - Dentition: Dentures, top;Dentures, bottom Patient Positioning: Upright in chair  Oral Cavity - Oral Hygiene     Dysphagia Treatment Treatment focused on: Patient/family/caregiver education Family/Caregiver Educated: spouse, RN Patient observed directly with PO's: No Reason PO's not observed:  (education only)   GO   Sean Lango MA, CCC-SLP [phone]   [name] 02/13/2012, 11:59 AM

## 2012-02-14 LAB — BASIC METABOLIC PANEL
BUN: 19 mg/dL (ref 6–23)
Calcium: 8.4 mg/dL (ref 8.4–10.5)
Creatinine, Ser: 1.23 mg/dL (ref 0.50–1.35)
GFR calc Af Amer: 64 mL/min — ABNORMAL LOW (ref >90)
GFR calc non Af Amer: 55 mL/min — ABNORMAL LOW (ref >90)

## 2012-02-14 LAB — PROTIME-INR
INR: 1.8 — ABNORMAL HIGH (ref 0.00–1.49)
Prothrombin Time: 21.2 seconds — ABNORMAL HIGH (ref 11.6–15.2)

## 2012-02-14 LAB — CBC
HCT: 29.1 % — ABNORMAL LOW (ref 39.0–52.0)
MCHC: 32.3 g/dL (ref 30.0–36.0)
Platelets: 271 10*3/uL (ref 150–400)
RDW: 17.8 % — ABNORMAL HIGH (ref 11.5–15.5)

## 2012-02-14 LAB — PRO B NATRIURETIC PEPTIDE: Pro B Natriuretic peptide (BNP): 6094 pg/mL — ABNORMAL HIGH (ref 0–450)

## 2012-02-14 LAB — GLUCOSE, CAPILLARY: Glucose-Capillary: 127 mg/dL — ABNORMAL HIGH (ref 70–99)

## 2012-02-14 MED ORDER — WARFARIN - PHARMACIST DOSING INPATIENT: Freq: Every day | Status: DC

## 2012-02-14 MED ORDER — LEVALBUTEROL HCL 0.63 MG/3ML IN NEBU
0.6300 mg | INHALATION_SOLUTION | Freq: Three times a day (TID) | RESPIRATORY_TRACT | Status: DC
  Administered 2012-02-15 – 2012-02-19 (×11): 0.63 mg via RESPIRATORY_TRACT
  Filled 2012-02-14 (×16): qty 3

## 2012-02-14 MED ORDER — WARFARIN 0.5 MG HALF TABLET
1.5000 mg | ORAL_TABLET | Freq: Once | ORAL | Status: DC
  Filled 2012-02-14: qty 1

## 2012-02-14 MED ORDER — LEVALBUTEROL HCL 0.63 MG/3ML IN NEBU
0.6300 mg | INHALATION_SOLUTION | Freq: Four times a day (QID) | RESPIRATORY_TRACT | Status: DC | PRN
  Administered 2012-02-18: 0.63 mg via RESPIRATORY_TRACT
  Filled 2012-02-14: qty 3

## 2012-02-14 NOTE — Progress Notes (Signed)
[facility] Progress Note  HPI: [name] is a 79/M , with multiple co-morbidities who presented with  He has had diarrhea for 2-3 weeks ever since he left nursing home. 4-5 BM a day , for the past one week it has become severe. He is getting very weak and unable to walk or even transfer to the whealchair. His home health nurse called his primary care doctor and he was sent to emergency department. in ED He was found to be severely dehydrated and hypotensive. Denies any blood in the stool it is very watery no improvement with imodium.  He has indwelling catheter since 2006 since prostate cancer. 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  Subsequently his diarrhea/hypotension and ARF have resolved with IVF and received PRBC transfusion too, he was also getting evaluated for dysphagia   Assessment/Plan:  Main Issue: Acute diastolic heart failure: Cause of INR being up. With increased diuretics, patient's BNP has come down to 6000. Will continue diuretics and recheck BNP in the morning.  Aspiration PNA: Day 4/10 IV abx.  WBC ct near normal.  Given allergies, I spoke to pharmacy and they are recommending that he'll need to continue IV antibiotics for his full course. Fortunately with dietary strictures, pneumonia is responding to antibiotics.  1. Diarrhea - resolved, possibly viral gastroenteritis appears to have run its course , DC IVF, Stool Cx negative & C.difficile PCR -negative    2. DIABETES MELLITUS, TYPE II - sliding scale   3.. Atrial fibrillation - metoprolol and digoxin on hold given bradycardia, HR better as digoxin gets out of system, held warfarin initially and will continue to do so with plans for Botox injections tomorrow. Can restart once this is over.  4 . Hypotension/ Dehydration - secondary to volume depletion, diarrhea, improved, stopped IV fluids and monitor her urine output   5. Anemia-acute on chronic - anemia panel unremarkable, suspect  chronic disease and worsened by acute illness, last EGD/Colonoscopy and capsule endoscopy 2 years ago, unable to find source of blood loss per wife  Transfused 2units PRBC 9/1, FU with hematology as outpatient  No overt blood loss   5. Hypocalcemia - will replace   6. Acute renal failure - - improved, stopped IVF   7. Coagulopathy: secondary to malnutrition/diarrhea/Vit K defi and coumadin, received Vit K on admission,. Main cause of volume overload. Once we diuresed him, his INR greatly decreased today.  8. Debility - s/p Pt/OT eval, decline SNF although may need  9. Dysphagia with persistent coughing with meals: s/p Swallow eval now and MBS in 5/23 with esophageal and pharyngeal dysphagia  Achalasia seen with endoscopy and repeat swallow they'll now has patient with dysphagia 3 mechanical soft diet with nectar thick liquids.  In addition, GI is planning on doing Botox injections for possible achalasia this weekend once INR is a little bit lower.  10. Asymptomatic bacteruria:  Urine cultures with [facility] but patient completely asymptomatic from this at this time, hence did not treat.   Prophylaxis:coagulopathic/ Protonix   CODE STATUS: DNR/DNI as per patient and family wishes  Ambulate, PT eval  Pt and wife decline SNF, home with [facility] services in the morning if white blood cell count improving and INR reasonable.   LOS: 6 days   [facility]  Pager: [phone]  02/11/2012, 12:59 PM    Subjective: Patient feeling better. Still having some occasional choking with meals.  Objective: Vital signs in last 24 hours: Temp:  [98.1 F (  36.7 C)-98.7 F (37.1 C)] 98.7 F (37.1 C) (09/06 1400) Pulse Rate:  [82-101] 82  (09/06 1400) Resp:  [18] 18  (09/06 1400) BP: (97-113)/(51-67) 105/67 mmHg (09/06 1400) SpO2:  [94 %-97 %] 96 % (09/06 1519) Weight:  [50.304 kg (110 lb 14.4 oz)] 50.304 kg (110 lb 14.4 oz) (09/06 0500) Weight change:  Last BM Date: 02/12/12  Intake/Output from  previous day: 09/05 0701 - 09/06 0700 In: 3 [I.V.:3] Out: 1900 [Urine:1900] Total I/O In: 320 [P.O.:320] Out: -    Physical Exam: General: Alert, awake, oriented x3, in no acute distress. HEENT: No bruits, no goiter. Heart: Regular rate and rhythm, without murmurs, rubs, gallops. Lungs: faint basilar crackles Abdomen: Soft, nontender, nondistended, positive bowel sounds. Extremities: L BKA, small opening of sutures at medial end Neuro: Grossly intact, nonfocal.    Lab Results: Basic Metabolic Panel:  Basename 02/14/12 0520 02/13/12 0520  NA 139 141  K 3.6 4.0  CL 99 105  CO2 31 28  GLUCOSE 105* 97  BUN 19 19  CREATININE 1.23 1.30  CALCIUM 8.4 8.1*  MG -- --  PHOS -- --   Liver Function Tests: No results found for this basename: AST:2,ALT:2,ALKPHOS:2,BILITOT:2,PROT:2,ALBUMIN:2 in the last 72 hours No results found for this basename: LIPASE:2,AMYLASE:2 in the last 72 hours No results found for this basename: AMMONIA:2 in the last 72 hours CBC:  Basename 02/14/12 0520 02/13/12 0520  WBC 11.8* 11.4*  NEUTROABS -- --  HGB 9.4* 8.9*  HCT 29.1* 27.5*  MCV 86.6 86.2  PLT 271 259   Cardiac Enzymes: No results found for this basename: CKTOTAL:3,CKMB:3,CKMBINDEX:3,TROPONINI:3 in the last 72 hours BNP:  Basename 02/14/12 0846 02/12/12 0945  PROBNP 6094.0* 35772.0*   D-Dimer: No results found for this basename: DDIMER:2 in the last 72 hours CBG:  Basename 02/14/12 1635 02/14/12 1137 02/14/12 0736 02/13/12 2053 02/13/12 1631 02/13/12 1120  GLUCAP 127* 199* 109* 151* 124* 120*   Hemoglobin A1C: No results found for this basename: HGBA1C in the last 72 hours Fasting Lipid Panel: No results found for this basename: CHOL,HDL,LDLCALC,TRIG,CHOLHDL,LDLDIRECT in the last 72 hours Thyroid Function Tests: No results found for this basename: TSH,T4TOTAL,FREET4,T3FREE,THYROIDAB in the last 72 hours Anemia Panel: No results found for this basename:  VITAMINB12,FOLATE,FERRITIN,TIBC,IRON,RETICCTPCT in the last 72 hours Coagulation:  Basename 02/14/12 0520 02/13/12 0520  LABPROT 21.2* 57.0*  INR 1.80* 6.38*   Urine Drug Screen: Drugs of Abuse  No results found for this basename: labopia,  cocainscrnur,  labbenz,  amphetmu,  thcu,  labbarb    Alcohol Level: No results found for this basename: ETH:2 in the last 72 hours Urinalysis: No results found for this basename: COLORURINE:2,APPERANCEUR:2,LABSPEC:2,PHURINE:2,GLUCOSEU:2,HGBUR:2,BILIRUBINUR:2,KETONESUR:2,PROTEINUR:2,UROBILINOGEN:2,NITRITE:2,LEUKOCYTESUR:2 in the last 72 hours Recent Results (from the past 240 hour(s))  MRSA PCR SCREENING     Status: Normal   Collection Time   02/08/12  2:49 AM      Component Value Range Status Comment   MRSA by PCR NEGATIVE  NEGATIVE Final   URINE CULTURE     Status: Normal   Collection Time   02/08/12  4:15 AM      Component Value Range Status Comment   Specimen Description URINE, SUPRAPUBIC   Final    Special Requests NONE   Final    Culture  Setup Time 02/08/2012 11:21   Final    Colony Count >=100,000 COLONIES/ML   Final    Culture ESCHERICHIA COLI   Final    Report Status 02/10/2012 FINAL   Final  Organism ID, Bacteria ESCHERICHIA COLI   Final   CLOSTRIDIUM DIFFICILE BY PCR     Status: Normal   Collection Time   02/09/12  2:58 PM      Component Value Range Status Comment   C difficile by pcr NEGATIVE  NEGATIVE Final   STOOL CULTURE     Status: Normal   Collection Time   02/09/12  2:58 PM      Component Value Range Status Comment   Specimen Description STOOL   Final    Special Requests NONE   Final    Culture     Final    Value: NO SALMONELLA, SHIGELLA, CAMPYLOBACTER, YERSINIA, OR E.COLI 0157:H7 ISOLATED   Report Status 02/13/2012 FINAL   Final   CULTURE, BLOOD (ROUTINE X 2)     Status: Normal (Preliminary result)   Collection Time   02/11/12  4:57 PM      Component Value Range Status Comment   Specimen Description BLOOD RIGHT ARM    Final    Special Requests BOTTLES DRAWN AEROBIC AND ANAEROBIC 10CC   Final    Culture  Setup Time 02/11/2012 20:39   Final    Culture     Final    Value:        BLOOD CULTURE RECEIVED NO GROWTH TO DATE CULTURE WILL BE HELD FOR 5 DAYS BEFORE ISSUING A FINAL NEGATIVE REPORT   Report Status PENDING   Incomplete   CULTURE, BLOOD (ROUTINE X 2)     Status: Normal (Preliminary result)   Collection Time   02/11/12  5:03 PM      Component Value Range Status Comment   Specimen Description BLOOD RIGHT HAND   Final    Special Requests BOTTLES DRAWN AEROBIC AND ANAEROBIC 10CC   Final    Culture  Setup Time 02/11/2012 20:39   Final    Culture     Final    Value:        BLOOD CULTURE RECEIVED NO GROWTH TO DATE CULTURE WILL BE HELD FOR 5 DAYS BEFORE ISSUING A FINAL NEGATIVE REPORT   Report Status PENDING   Incomplete     Studies/Results: Dg Chest Port 1 View  02/14/2012  *RADIOLOGY REPORT*  Clinical Data: PICC line placement  PORTABLE CHEST - 1 VIEW  Comparison: Portable exam 2332 hours compared to 02/11/2012  Findings: Right arm PICC line, tip projecting over SVC above cavoatrial junction. Enlargement of cardiac silhouette post CABG. Calcified tortuous aorta. Mediastinal contours and pulmonary vascularity otherwise normal. Right greater than left basilar atelectasis versus infiltrate. Overall improved aeration versus previous exam. No pleural effusion or pneumothorax.  IMPRESSION: Tip of right arm PICC line projects over SVC. Bibasilar atelectasis versus infiltrate. Improved aeration since 02/11/2012.   Original Report Authenticated By: Lollie Marrow, M.D.    Dg Swallowing Func-no Report  02/13/2012  CLINICAL DATA: dysphagia   FLUOROSCOPY FOR SWALLOWING FUNCTION STUDY:  Fluoroscopy was provided for swallowing function study, which was  administered by a speech pathologist.  Final results and recommendations  from this study are contained within the speech pathology report.      Medications: Scheduled Meds:     . atorvastatin  40 mg Oral QHS  . cefTAZidime (FORTAZ)  IV  1 g Intravenous Q12H  . feeding supplement  1 Container Oral TID BM  . furosemide  40 mg Intravenous Q8H  . insulin aspart  0-9 Units Subcutaneous TID WC & HS  . levalbuterol  0.63 mg Nebulization Q6H  .  magnesium oxide  400 mg Oral Daily  . OLANZapine  5 mg Oral QHS  . olanzapine-FLUoxetine  1 capsule Oral QHS  . pantoprazole  40 mg Oral Q1200  . sodium chloride  3 mL Intravenous Q12H  . vancomycin  500 mg Intravenous Q12H  . Warfarin - Pharmacist Dosing Inpatient   Does not apply q1800  . DISCONTD: warfarin  1.5 mg Oral ONCE-1800   Continuous Infusions:   PRN Meds:.acetaminophen, acetaminophen, albuterol, alum & mag hydroxide-simeth, guaiFENesin-dextromethorphan, HYDROcodone-acetaminophen, naloxone, ondansetron (ZOFRAN) IV, ondansetron, RESOURCE THICKENUP CLEAR, sodium chloride  Assessment/Plan:  1. Diarrhea - resolved, possibly viral gastroenteritis appears to have run its course DC IVF Stool Cx negative C.difficile PCR -negative  Formed stool yesterday  2. DIABETES MELLITUS, TYPE II -  sliding scale  3.. Atrial fibrillation -  metoprolol and digoxin on hold given bradycardia, HR better as digoxin gets out of system, hold warfarin Appreciate cards input  4 . Hypotension/ Dehydration - secondary to volume depletion, diarrhea, improved, stopped IV fluids and monitor her urine output  5. Anemia-acute on chronic - anemia panel unremarkable, suspect chronic disease and worsened by acute illness, last EGD/Colonoscopy and capsule endoscopy 2 years ago, unable to find source of blood loss per wife Transfused 2units PRBC 9/1, FU with hematology as outpatient No overt blood loss  5. Hypocalcemia - will replace   6. Acute renal failure - - improved, stopped IVF  7. Coagulopathy: secondary to malnutrition/diarrhea/Vit K defi and coumadin, received Vit K on admission, continue to hold coumadin  8. Debility - will have  PT OT evaluation. He has home health. His family is weiry about nursing home placement secondary to bad experiences   9. Dysphagia with persistent coughing with meals: s/p Swallow eval now and MBS in 5/23 with esophageal and pharyngeal dysphagia Will get DG esophagus and request GI eval If scheduled for EGD will need FFP to reverse INR Wife unfortunately reluctant to get this worked up as outpatient  10. Asymptomatic bacteruria: Urine cultures with [facility] but patient completely asymptomatic from this at this time, hence did not treat.  Prophylaxis:coagulopathic/ Protonix   CODE STATUS: DNR/DNI as per patient and family wishes  Ambulate, PT eval Pt and wife decline SNF, home with [facility] services after GI eval  Time spent: 45 minutes   LOS: 7 days   Hollice Espy [facility] Pager: [phone] 02/14/2012, 5:08 PM

## 2012-02-14 NOTE — Plan of Care (Signed)
Problem: Phase II Progression Outcomes Goal: Discharge plan established Outcome: Progressing Plan is to go home with [facility]  Problem: Discharge Progression Outcomes Goal: Tolerating diet Outcome: Progressing Encouraging PO intake

## 2012-02-14 NOTE — Progress Notes (Signed)
Physical Therapy Treatment Patient Details Name: [name] MRN: [id number] DOB: 09-22-1931 Today's Date: 02/14/2012 Time: 2952-8413 PT Time Calculation (min): 26 min  PT Assessment / Plan / Recommendation Comments on Treatment Session  Pt admitted s/p SOB with increased INR.  Pt still requiring significant assist and would benefit from d/c to SNF for more therapy prior to d/c home.  Discussed SNF recommendation with pt, who reports he wants to go home.  If to d/c home, pt will need a hoyer lift for OOB at this time as well as 24/7 assistance (2 person for mobility).    Follow Up Recommendations  Skilled nursing facility    Barriers to Discharge        Equipment Recommendations  Defer to next venue    Recommendations for Other Services    Frequency Min 3X/week   Plan Discharge plan remains appropriate;Frequency remains appropriate    Precautions / Restrictions Precautions Precautions: Fall Restrictions Weight Bearing Restrictions: No   Pertinent Vitals/Pain None    Mobility  Bed Mobility Bed Mobility: Supine to Sit;Sit to Supine Supine to Sit: 3: Mod assist;HOB flat Sit to Supine: 2: Max assist;HOB flat Details for Bed Mobility Assistance: Assist to facilitate rotation of hips to EOB as well as translate trunk anterior/slow descent to surface.  Cues for sequence. Transfers Transfers: Sit to Stand;Stand to Sit (2 trials.) Sit to Stand: 2: Max assist;From bed Stand to Sit: 2: Max assist;To bed Details for Transfer Assistance: Assist to translate trunk anterior over BOS while blocking left LE at knee/foot to prevent buckling/sliding.  Facilitation at hips to extend as well as trunk.  Cues for safest sequence.  Pt with increased difficulty this treatment shifting weight anterior with increased posterior lean decreasing independence and safety. Ambulation/Gait Ambulation/Gait Assistance: Not tested (comment) Stairs: No Wheelchair Mobility Wheelchair Mobility: No      Exercises General Exercises - Lower Extremity Ankle Circles/Pumps: AROM;Left;10 reps;Supine Quad Sets: AROM;Both;10 reps;Supine Heel Slides: AROM;Both;10 reps;Supine Hip ABduction/ADduction: AAROM;Both;10 reps;Supine Straight Leg Raises: AAROM;Both;10 reps;Supine   PT Diagnosis:    PT Problem List:   PT Treatment Interventions:     PT Goals Acute Rehab PT Goals PT Goal Formulation: With patient Time For Goal Achievement: 02/24/12 Potential to Achieve Goals: Fair PT Goal: Supine/Side to Sit - Progress: Progressing toward goal PT Goal: Sit to Supine/Side - Progress: Progressing toward goal  Visit Information  Last PT Received On: 02/14/12 Assistance Needed: +2    Subjective Data  Subjective: "I am ready to go home." Patient Stated Goal: None stated.     Cognition  Overall Cognitive Status: Impaired Area of Impairment: Memory Arousal/Alertness: Awake/alert Orientation Level: Disoriented to;Time Behavior During Session: WFL for tasks performed    Balance  Balance Balance Assessed: Yes Static Sitting Balance Static Sitting - Balance Support: Bilateral upper extremity supported;Feet supported Static Sitting - Level of Assistance: 2: Max assist (Up to max assist) Static Sitting - Comment/# of Minutes: Pt able to sit EOB 10 minutes with up to max assist due to strong posterior/right lean.  Cues to shift weight anterior over BOS and to left with facilitation to achieve midline.  Pt able to progress to min assist at EOB.  End of Session PT - End of Session Equipment Utilized During Treatment: Gait belt Activity Tolerance: Patient tolerated treatment well Patient left: in bed;with call bell/phone within reach;with bed alarm set Nurse Communication: Mobility status   GP     Cephus Shelling 02/14/2012, 9:37 AM  02/14/2012 Leotis Shames  Michaele Offer, PT, DPT [phone]

## 2012-02-14 NOTE — Progress Notes (Signed)
[facility] Progress Note  Subjective:  Feels ok this morning.  Wants to try to go home soon.  Had a lot of coughing with dinner last night, but did better with breakfast this AM.  Objective:  Vital signs in last 24 hours: Temp:  [98 F (36.7 C)-98.2 F (36.8 C)] 98.1 F (36.7 C) (09/06 0500) Pulse Rate:  [81-101] 101  (09/06 0500) Resp:  [18-19] 18  (09/06 0500) BP: (97-120)/(51-65) 97/61 mmHg (09/06 0500) SpO2:  [95 %-97 %] 97 % (09/06 0500) Weight:  [110 lb 14.4 oz (50.304 kg)] 110 lb 14.4 oz (50.304 kg) (09/06 0500) Last BM Date: 02/12/12 General:   Alert, thin and chronically ill-appearing, in NAD Heart:  Regular rate and rhythm; no murmurs Pulm:  Coarse lung sounds B/L. Abdomen:  Soft, nontender and nondistended. Normal bowel sounds, without guarding, and without rebound.   Extremities:  Without edema.  R BKA with bandage. Neurologic:  Alert and  oriented x4;  grossly normal neurologically. Psych:  Alert and cooperative.  Depressed mood and affect.  Intake/Output from previous day: 09/05 0701 - 09/06 0700 In: 3 [I.V.:3] Out: 1900 [Urine:1900]  Lab Results:  Basename 02/14/12 0520 02/13/12 0520 02/12/12 0520  WBC 11.8* 11.4* 16.5*  HGB 9.4* 8.9* 9.3*  HCT 29.1* 27.5* 28.2*  PLT 271 259 255   BMET  Basename 02/14/12 0520 02/13/12 0520 02/12/12 0520  NA 139 141 138  K 3.6 4.0 4.6  CL 99 105 107  CO2 31 28 22   GLUCOSE 105* 97 114*  BUN 19 19 19   CREATININE 1.23 1.30 1.26  CALCIUM 8.4 8.1* 7.5*   PT/INR  Basename 02/14/12 0520 02/13/12 0520  LABPROT 21.2* 57.0*  INR 1.80* 6.38*    Dg Chest Port 1 View  02/14/2012  *RADIOLOGY REPORT*  Clinical Data: PICC line placement  PORTABLE CHEST - 1 VIEW  Comparison: Portable exam 2332 hours compared to 02/11/2012  Findings: Right arm PICC line, tip projecting over SVC above cavoatrial junction. Enlargement of cardiac silhouette post CABG. Calcified tortuous aorta. Mediastinal contours and pulmonary  vascularity otherwise normal. Right greater than left basilar atelectasis versus infiltrate. Overall improved aeration versus previous exam. No pleural effusion or pneumothorax.  IMPRESSION: Tip of right arm PICC line projects over SVC. Bibasilar atelectasis versus infiltrate. Improved aeration since 02/11/2012.   Original Report Authenticated By: Lollie Marrow, M.D.    Dg Swallowing Func-no Report  02/13/2012  CLINICAL DATA: dysphagia   FLUOROSCOPY FOR SWALLOWING FUNCTION STUDY:  Fluoroscopy was provided for swallowing function study, which was  administered by a speech pathologist.  Final results and recommendations  from this study are contained within the speech pathology report.      Assessment / Plan: -Mixed oropharyngeal and esophageal dysphagia-possible component of achalasia -Coagulopathy-INR has been reversed, down to 1.8 today -Diarrhea-resolved  -Anemia-s/p 2 units PRBC's on 9/1 with fairly stable Hgb since that time. Was evaluated extensively with his routine GI physician as an outpatient previously. No evidence of GIB currently. To see heme/onc as well.  *Follow recommendations given by speech and nutrition to decrease aspiration risk. *Will discuss with Dr. Christella Hartigan when EGD with botox could be performed; patient and his wife seem very interested in attempting this option.   LOS: 7 days   ZEHR, JESSICA D.  02/14/2012, 9:14 AM  Pager number [phone]    ________________________________________________________________________  [facility] MD note:  I personally examined the patient, reviewed the data.  I spoke for a long time with him  and his wife.  Options discussed were EGD with botox tomorrow for possible achalasia; no egd but home tomorrow with trial of speech recs; or trial of dobhof tube with good TID nutritional supplements. I spoke with speech therapy as well about this.  For now, will order dobhov tube placed by IR and then dietary to advise on good nutritional intake.  My  goal will be to keep this in place for 3-4 weeks, at home feeding and follow up in my office after that to reasses. His OP dysphagia (which could be in large part related to deconditioning) may get much better.   Rob Bunting, MD [facility] Pager [phone]

## 2012-02-14 NOTE — Progress Notes (Signed)
Speech Language Pathology Dysphagia Treatment Patient Details Name: [name] MRN: [id number] DOB: [birth year] Today's Date: 02/14/2012 Time: 4098-1191 SLP Time Calculation (min): 25 min  Assessment / Plan / Recommendation Clinical Impression  Swallowing treatment complete. Patient self feeding pm meal. Wet vocal quality noted upon arrival to room, requiring intial max verbal cueing to utilize compensatory strategies taught following MBS 9/5, fading to min verbal cues by end of session. Education complete with spouse who was present in room regarding importance of use of strategies and supervision for patient during meal time. SLP will continue to f/u.     Diet Recommendation  Continue with Current Diet: Dysphagia 3 (mechanical soft);Nectar-thick liquid    SLP Plan Continue with current plan of care   Pertinent Vitals/Pain n/a   Swallowing Goals  SLP Swallowing Goals Patient will consume recommended diet without observed clinical signs of aspiration with: Minimal assistance Swallow Study Goal #1 - Progress: Progressing toward goal Patient will utilize recommended strategies during swallow to increase swallowing safety with: Minimal assistance Swallow Study Goal #2 - Progress: Progressing toward goal  General Temperature Spikes Noted: No Respiratory Status: Supplemental O2 delivered via (comment) (2 L nasal cannula) Behavior/Cognition: Alert;Cooperative Oral Cavity - Dentition: Dentures, top;Dentures, bottom Patient Positioning: Upright in bed      Dysphagia Treatment Treatment focused on: Skilled observation of diet tolerance;Patient/family/caregiver education;Utilization of compensatory strategies Family/Caregiver Educated: spouse, RN Treatment Methods/Modalities: Skilled observation;Differential diagnosis Patient observed directly with PO's: Yes Type of PO's observed: Dysphagia 3 (soft);Nectar-thick liquids Feeding: Able to feed self Liquids provided via: Teaspoon Oral  Phase Signs & Symptoms: Prolonged bolus formation Pharyngeal Phase Signs & Symptoms: Wet vocal quality;Multiple swallows Type of cueing: Verbal Amount of cueing: Moderate   GO   Ferdinand Lango MA, CCC-SLP [phone]   [name] 02/14/2012, 1:33 PM

## 2012-02-14 NOTE — Progress Notes (Addendum)
ANTICOAGULATION CONSULT NOTE - Follow Up Consult  Pharmacy Consult for Coumadin Indication: atrial fibrillation  Allergies  Allergen Reactions  . Levaquin (Levofloxacin) Rash  . Morphine And Related Other (See Comments)    Confusion, agitation  . Codeine Nausea And Vomiting    Noted on MAR  . Penicillins Rash    Noted on [facility]    Patient Measurements: Height: 5\' 9"  (175.3 cm) Weight: 110 lb 14.4 oz (50.304 kg) IBW/kg (Calculated) : 70.7    Vital Signs: Temp: 98.1 F (36.7 C) (09/06 0500) Temp src: Oral (09/06 0500) BP: 97/61 mmHg (09/06 0500) Pulse Rate: 101  (09/06 0500)  Labs:  Basename 02/14/12 0520 02/13/12 0520 02/12/12 0520  HGB 9.4* 8.9* --  HCT 29.1* 27.5* 28.2*  PLT 271 259 255  APTT -- -- --  LABPROT 21.2* 57.0* 46.7*  INR 1.80* 6.38* 4.94*  HEPARINUNFRC -- -- --  CREATININE 1.23 1.30 1.26  CKTOTAL -- -- --  CKMB -- -- --  TROPONINI -- -- --    Estimated Creatinine Clearance: 35.8 ml/min (by C-G formula based on Cr of 1.23).  Assessment: Patient is a 79 y.o M on coumadin for afib.  S/p Vit K 5mg  PO on 9/5 with INR decreased from 6.38 to 1.80 today.  No bleeding noted.   Goal of Therapy:  INR 2-3 Monitor platelets by anticoagulation protocol: Yes   Plan:  1) Coumadin 1.5mg  PO x1 today   Krystian Younglove P 02/14/2012,10:24 AM   Adden (3:51PM): Plan for EGD on 9/7.  Ok with Dr. Rito Ehrlich to hold coumadin until after procedure.  No bridging.

## 2012-02-14 NOTE — Clinical Social Work Psychosocial (Addendum)
    Clinical Social Work Department BRIEF PSYCHOSOCIAL ASSESSMENT 02/13/2012  Patient:  [name], [name]     Account Number:  [id number]     Admit date:  02/07/2012  Clinical Social Worker:  Doree Albee  Date/Time:  02/13/2012 02:00 PM  Referred by:  RN  Date Referred:  02/13/2012 Referred for  Transportation assistance  SNF Placement   Other Referral:   Interview type:  Patient Other interview type:   and patient spouse    PSYCHOSOCIAL DATA Living Status:  FAMILY Admitted from facility:   Level of care:   Primary support name:  [name] Primary support relationship to patient:  SPOUSE Degree of support available:   strong    CURRENT CONCERNS Current Concerns  Post-Acute Placement   Other Concerns:    SOCIAL WORK ASSESSMENT / PLAN CSW met with pt and pt spouse at bedside along with rn case manager to confirm pt dc plans. Pt, Pt Spouse, CSW, and Rn case manager discussed recommendations for skilled nursing. Pt and pt spouse adamantly refused skilled nursing sharing that there 6 children are able to help when needed.    Pt and pt spouse shared that pt will be able to go to doctors appointments with assistance from pt sons. Pt spouse also is aware of senior transportation in [place] that she has used previouslly for dr appts.    Pt spouse and pt shared that pt daughter in law is also not working at this time and can help provide care for pt when pt spouse neededs assitance. Pt duaghter in law plans to live with pt and pt spouse for the first few weeks after discharge.    Pt plans to dc home with home health services including RN, PT, OT, AID, and CSW. Pt, pt spouse, and csw discussed the role of the csw in pt home to help with placement but also other resources and couseling.    Despite recommendations, pt and pt spouse feel they have the support and equipment necessary at home to provide proper care for pt at home.    Pt and pt spouse did request non  emergency ambulance for pt to dc home when medically stable. CSW will assist with transportation home when medically stable. Please call csw when pt is medically stable and ready for transportation home.   Assessment/plan status:  No Further Intervention Required Other assessment/ plan:   Information/referral to community resources:   No resources identified at this time, pt well connected iwthin [employer] and will follow up with home health csw.    PATIENT'S/FAMILY'S RESPONSE TO PLAN OF CARE: Pt and pt spouse thanked csw and rn case Production designer, theatre/television/film for concern and support. Pt is motivated to return home when medically stable with support from pt family.

## 2012-02-15 ENCOUNTER — Encounter (HOSPITAL_COMMUNITY)
Admission: EM | Disposition: A | Discharge status: Home Health | Payer: Self-pay | Source: Home / Self Care | Attending: Internal Medicine

## 2012-02-15 ENCOUNTER — Inpatient Hospital Stay (HOSPITAL_COMMUNITY): Payer: Medicare Other

## 2012-02-15 LAB — PROTIME-INR
INR: 1.36 (ref 0.00–1.49)
Prothrombin Time: 17 seconds — ABNORMAL HIGH (ref 11.6–15.2)

## 2012-02-15 LAB — GLUCOSE, CAPILLARY
Glucose-Capillary: 149 mg/dL — ABNORMAL HIGH (ref 70–99)
Glucose-Capillary: 106 mg/dL — ABNORMAL HIGH (ref 70–99)
Glucose-Capillary: 154 mg/dL — ABNORMAL HIGH (ref 70–99)
Glucose-Capillary: 142 mg/dL — ABNORMAL HIGH (ref 70–99)

## 2012-02-15 SURGERY — EGD (ESOPHAGOGASTRODUODENOSCOPY)
Anesthesia: Moderate Sedation

## 2012-02-15 MED ORDER — WARFARIN SODIUM 3 MG PO TABS
3.0000 mg | ORAL_TABLET | Freq: Once | ORAL | Status: AC
  Administered 2012-02-15: 3 mg via ORAL
  Filled 2012-02-15: qty 1

## 2012-02-15 MED ORDER — IOHEXOL 300 MG/ML  SOLN: 100.0000 mL | Freq: Once | INTRAMUSCULAR | Status: AC | PRN

## 2012-02-15 MED ORDER — OSMOLITE 1.2 CAL PO LIQD
1000.0000 mL | ORAL | Status: DC
  Administered 2012-02-15: 1000 mL
  Filled 2012-02-15 (×4): qty 1000

## 2012-02-15 NOTE — Progress Notes (Signed)
ANTICOAGULATION CONSULT NOTE - Follow Up Consult  Pharmacy Consult for Coumadin Indication: atrial fibrillation  Allergies  Allergen Reactions  . Levaquin (Levofloxacin) Rash  . Morphine And Related Other (See Comments)    Confusion, agitation  . Codeine Nausea And Vomiting    Noted on MAR  . Penicillins Rash    Noted on [facility]    Patient Measurements: Height: 5\' 9"  (175.3 cm) Weight: 113 lb 9.6 oz (51.529 kg) IBW/kg (Calculated) : 70.7    Vital Signs: Temp: 97.7 F (36.5 C) (09/07 0500) Temp src: Oral (09/07 0500) BP: 112/68 mmHg (09/07 0500) Pulse Rate: 81  (09/07 0500)  Labs:  Basename 02/15/12 4098 02/14/12 0520 02/13/12 0520  HGB -- 9.4* 8.9*  HCT -- 29.1* 27.5*  PLT -- 271 259  APTT -- -- --  LABPROT 17.0* 21.2* 57.0*  INR 1.36 1.80* 6.38*  HEPARINUNFRC -- -- --  CREATININE -- 1.23 1.30  CKTOTAL -- -- --  CKMB -- -- --  TROPONINI -- -- --    Estimated Creatinine Clearance: 36.6 ml/min (by C-G formula based on Cr of 1.23).   Assessment: 78 yo M on Coumadin PTA (4.5mg  Tu/F, 3mg  other days) for atrial fibrillation. Coumadin has been on hold for procedures and patient will likely be discharged to home tomorrow per GI MD.  INR SUB-therapeutic at 1.36. H/H 9.4/29.1 and stable, plts 271. No bleeding noted per chart.  Goal of Therapy:  INR 2-3 Monitor platelets by anticoagulation protocol: Yes   Plan:  - Coumadin 3mg  PO x1 tonight - F/u INR in AM - F/u CBC at least q72h   Thank you for the consult.   Tomi Bamberger, PharmD Clinical Pharmacist Pager: [phone] Pharmacy: [phone] 02/15/2012 1:21 PM

## 2012-02-15 NOTE — Progress Notes (Signed)
Feeding tube placed by Radiologist this am is actually a NG tube, NOT a panda tube.  Order was for NG tube. Unclear if pt needs to go home with NG tube in place, since it may come out of place.  Dr. Rito Ehrlich should be made aware during progression in the am.

## 2012-02-15 NOTE — Progress Notes (Signed)
[facility] Progress Note  HPI: Mr. Judson is a 79/M , with multiple co-morbidities who presented with  He has had diarrhea for 2-3 weeks ever since he left nursing home. 4-5 BM a day , for the past one week it has become severe. He is getting very weak and unable to walk or even transfer to the whealchair. His home health nurse called his primary care doctor and he was sent to emergency department. in ED He was found to be severely dehydrated and hypotensive. Denies any blood in the stool it is very watery no improvement with imodium.  He has indwelling catheter since 2006 since prostate cancer. 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  Subsequently his diarrhea/hypotension and ARF have resolved with IVF and received PRBC transfusion too, he was also getting evaluated for dysphagia   Assessment/Plan:  Main Issue: Acute diastolic heart failure: Cause of INR being up. With increased diuretics, patient's BNP has come down to 3500. Will continue diuretics and recheck BNP in the morning. Likely should be good for diuresis by tomorrow.  Aspiration PNA: Day 5/10 IV abx.  WBC ct near normal.  Given allergies, I spoke to pharmacy and they are recommending that he'll need to continue IV antibiotics for his full course. Fortunately with dietary restrictions, pneumonia is responding to antibiotics.  1. Diarrhea - resolved, possibly viral gastroenteritis appears to have run its course , DC IVF, Stool Cx negative & C.difficile PCR -negative    2. DIABETES MELLITUS, TYPE II - sliding scale   3.. Atrial fibrillation - metoprolol and digoxin on hold given bradycardia, HR better as digoxin gets out of system, held warfarin initially because of aspiration issues. Has since restarted. INR not yet therapeutic.  4 . Hypotension/ Dehydration - secondary to volume depletion, diarrhea, improved, stopped IV fluids because of volume overload and monitor her urine output   5.  Anemia-acute on chronic - anemia panel unremarkable, suspect chronic disease and worsened by acute illness, last EGD/Colonoscopy and capsule endoscopy 2 years ago, unable to find source of blood loss per wife  Transfused 2units PRBC 9/1, FU with hematology as outpatient  No overt blood loss   5. Hypocalcemia - will replace   6. Acute renal failure - - improved, stopped IVF   7. Coagulopathy: secondary to malnutrition/diarrhea/Vit K defi and coumadin, received Vit K on admission,. Main cause of volume overload. Once we diuresed him, his INR greatly decreased today.   8. Debility - s/p Pt/OT eval, decline SNF although may need  9. Dysphagia with persistent coughing with meals:  MBS in 5/23 with esophageal and pharyngeal dysphagia .  Achalasia seen with endoscopy this admission and repeat modified barium swallow, we have started the patient on a dysphagia 3 mechanical soft diet with nectar thick liquids. Gastroenterology followed up with the patient and was concerned that with such severe restrictions, the patient was banned all day long eating slowly and not did not proper caloric intake. It was felt best in a short-term to place a panda 2 which was done today and supplemental tube feedings can be started. Nutrition is coming to see the patient to recommend type and dose of tube feeds  10. Asymptomatic bacteruria:  Urine cultures with [facility] but patient completely asymptomatic from this at this time, hence did not treat. It will be covered anyway by his antibiotics for his pneumonia.   CODE STATUS: DNR/DNI as per patient and family wishes  Ambulate, PT eval  Pt  and wife decline SNF, home with [facility] services . Close to being fully diuresed by tomorrow which will also give [place] time to set up home health tube feedings  LOS: 6 days   [facility]  Pager: [phone]  02/11/2012, 12:59 PM    Subjective: Patient feeling better. Tolerated placement of panda tube  Objective: Vital signs in last  24 hours: Temp:  [97.7 F (36.5 C)-98.6 F (37 C)] 98.6 F (37 C) (09/07 1428) Pulse Rate:  [80-82] 80  (09/07 1428) Resp:  [16] 16  (09/07 1428) BP: (94-112)/(56-68) 100/63 mmHg (09/07 1428) SpO2:  [96 %-98 %] 98 % (09/07 1520) Weight:  [51.529 kg (113 lb 9.6 oz)] 51.529 kg (113 lb 9.6 oz) (09/07 0500) Weight change: 1.225 kg (2 lb 11.2 oz) Last BM Date: 02/12/12  Intake/Output from previous day: 09/06 0701 - 09/07 0700 In: 880 [P.O.:440; I.V.:240; IV Piggyback:200] Out: 1650 [Urine:1650]     Physical Exam: General: Alert, awake, oriented x3, in no acute distress. HEENT: No bruits, no goiter. Panda tube in place Heart: Regular rate and rhythm, without murmurs, rubs, gallops. Lungs: faint basilar crackles Abdomen: Soft, nontender, nondistended, positive bowel sounds. Extremities: L BKA, small opening of sutures at medial end Neuro: Grossly intact, nonfocal.    Lab Results: Basic Metabolic Panel:  Basename 02/14/12 0520 02/13/12 0520  NA 139 141  K 3.6 4.0  CL 99 105  CO2 31 28  GLUCOSE 105* 97  BUN 19 19  CREATININE 1.23 1.30  CALCIUM 8.4 8.1*  MG -- --  PHOS -- --   CBC:  Basename 02/14/12 0520 02/13/12 0520  WBC 11.8* 11.4*  NEUTROABS -- --  HGB 9.4* 8.9*  HCT 29.1* 27.5*  MCV 86.6 86.2  PLT 271 259   BNP:  Basename 02/15/12 1147 02/14/12 0846  PROBNP 3751.0* 6094.0*   CBG:  Basename 02/15/12 1244 02/15/12 0722 02/14/12 2036 02/14/12 1635 02/14/12 1137 02/14/12 0736  GLUCAP 106* 149* 150* 127* 199* 109*   Coagulation:  Basename 02/15/12 0637 02/14/12 0520  LABPROT 17.0* 21.2*  INR 1.36 1.80*                           URINE CULTURE     Status: Normal   Collection Time   02/08/12  4:15 AM      Component Value Range Status Comment   Specimen Description URINE, SUPRAPUBIC   Final    Special Requests NONE   Final    Culture  Setup Time 02/08/2012 11:21   Final    Colony Count >=100,000 COLONIES/ML   Final    Culture ESCHERICHIA COLI    Final    Report Status 02/10/2012 FINAL   Final    Organism ID, Bacteria ESCHERICHIA COLI   Final     Studies/Results: Dg Chest Port 1 View 02/14/2012    IMPRESSION: Tip of right arm PICC line projects over SVC. Bibasilar atelectasis versus infiltrate. Improved aeration since 02/11/2012.   Original Report Authenticated By: Lollie Marrow, M.D.    Dg Naso G Tube Plc W/fl W/rad  02/15/2012  *RADIOLOGY REPORT*  Indication: Malnutrition, aspiration risk  FLOUROSCOPIC GUIDED PLACEMENT OF DOBBOHOFF TUBE  Comparisons: Barium swallow examination - 02/11/2012  Contrast: 50 ml Omnipaque-300 administered into the gastric lumen.  Fluoroscopy Time: 8.46 minutes.  Complications: None immediate  Technique / Findings:  The NG tube was lubricated with viscous lidocaine inserted into the right nostril.  Under intermittent fluoroscopic guidance,  the Dobbhoff tube was advanced through the stomach. Despite prolonged efforts, the tube could not be advanced past the location of the gastric antrum/duodenal bulb.  A spot fluoroscopic image was saved for documentation purposes.  The tube was affixed to the patient's nose with tape.  The patient tolerated the procedure well without immediate postprocedural complication.  Impression:  Successful fluoroscopic guided placement of NG tube with tip terminating within the gastric antrum/duodenal bulb.   Original Report Authenticated By: Waynard Reeds, M.D.     Medications: Scheduled Meds:    . atorvastatin  40 mg Oral QHS  . cefTAZidime (FORTAZ)  IV  1 g Intravenous Q12H  . feeding supplement  1 Container Oral TID BM  . furosemide  40 mg Intravenous Q8H  . insulin aspart  0-9 Units Subcutaneous TID WC & HS  . levalbuterol  0.63 mg Nebulization TID  . magnesium oxide  400 mg Oral Daily  . OLANZapine  5 mg Oral QHS  . olanzapine-FLUoxetine  1 capsule Oral QHS  . pantoprazole  40 mg Oral Q1200  . sodium chloride  3 mL Intravenous Q12H  . vancomycin  500 mg Intravenous Q12H    . warfarin  3 mg Oral ONCE-1800  . Warfarin - Pharmacist Dosing Inpatient   Does not apply q1800  . DISCONTD: levalbuterol  0.63 mg Nebulization Q6H  . DISCONTD: warfarin  1.5 mg Oral ONCE-1800   Continuous Infusions:   PRN Meds:.acetaminophen, acetaminophen, albuterol, alum & mag hydroxide-simeth, guaiFENesin-dextromethorphan, HYDROcodone-acetaminophen, iohexol, levalbuterol, naloxone, ondansetron (ZOFRAN) IV, ondansetron, RESOURCE THICKENUP CLEAR, sodium chloride  Assessment/Plan:  1. Diarrhea - resolved, possibly viral gastroenteritis appears to have run its course DC IVF Stool Cx negative C.difficile PCR -negative  Formed stool yesterday  2. DIABETES MELLITUS, TYPE II -  sliding scale  3.. Atrial fibrillation -  metoprolol and digoxin on hold given bradycardia, HR better as digoxin gets out of system, hold warfarin Appreciate cards input  4 . Hypotension/ Dehydration - secondary to volume depletion, diarrhea, improved, stopped IV fluids and monitor her urine output  5. Anemia-acute on chronic - anemia panel unremarkable, suspect chronic disease and worsened by acute illness, last EGD/Colonoscopy and capsule endoscopy 2 years ago, unable to find source of blood loss per wife Transfused 2units PRBC 9/1, FU with hematology as outpatient No overt blood loss  5. Hypocalcemia - will replace   6. Acute renal failure - - improved, stopped IVF  7. Coagulopathy: secondary to malnutrition/diarrhea/Vit K defi and coumadin, received Vit K on admission, continue to hold coumadin  8. Debility - will have PT OT evaluation. He has home health. His family is weiry about nursing home placement secondary to bad experiences   9. Dysphagia with persistent coughing with meals: s/p Swallow eval now and MBS in 5/23 with esophageal and pharyngeal dysphagia Will get DG esophagus and request GI eval If scheduled for EGD will need FFP to reverse INR Wife unfortunately reluctant to get this worked up  as outpatient  10. Asymptomatic bacteruria: Urine cultures with [facility] but patient completely asymptomatic from this at this time, hence did not treat.  Prophylaxis:coagulopathic/ Protonix   CODE STATUS: DNR/DNI as per patient and family wishes  Ambulate, PT eval Pt and wife decline SNF, home with [facility] services after GI eval  Time spent:  30  minutes   LOS: 8 days   Hollice Espy [facility] Pager: [phone] 02/15/2012, 3:26 PM

## 2012-02-15 NOTE — Progress Notes (Signed)
Pt tube feeding increased to 49ml/hr per ordered rate increase.  Pt. Had 0ml residual of tubing feeding and placement checked using auscultation prior to tube feeding increase.  Pt. Tolerating tube feeding well at this time.

## 2012-02-15 NOTE — Progress Notes (Signed)
Nutrition Follow-up  Intervention:   For supplemental TF via panda, start Osmolite 1.2 at 20 ml/h, increase by 10 ml every 4 hours to goal rate of 70 ml/h.  Once tolerance established at goal rate, run TF for 12 hours nocturnally (7 PM - 7 AM)  to provide 1008 kcals, 47 grams protein, 680 ml free water daily (~50% of estimated nutrition needs).  Continue Ensure Pudding 3 times daily between meals (170 kcals, 4 gm protein per 4 oz cup)  Assessment:   PO intake remains variable.  Spoke with RN and MD, plans are to send patient home on a dysphagia 3-nectar diet and with a panda for supplemental TF.  Discussed with MD the increased risk for tube dislocation and aspiration with a panda at home.  Family does not want a PEG.     Diet Order:  Dysphagia 3, nectar thick liquids; Ensure Pudding TID  Patient is consuming 15-75% of meals.  Meds: Scheduled Meds:    . atorvastatin  40 mg Oral QHS  . cefTAZidime (FORTAZ)  IV  1 g Intravenous Q12H  . feeding supplement  1 Container Oral TID BM  . furosemide  40 mg Intravenous Q8H  . insulin aspart  0-9 Units Subcutaneous TID WC & HS  . levalbuterol  0.63 mg Nebulization TID  . magnesium oxide  400 mg Oral Daily  . OLANZapine  5 mg Oral QHS  . olanzapine-FLUoxetine  1 capsule Oral QHS  . pantoprazole  40 mg Oral Q1200  . sodium chloride  3 mL Intravenous Q12H  . vancomycin  500 mg Intravenous Q12H  . warfarin  3 mg Oral ONCE-1800  . Warfarin - Pharmacist Dosing Inpatient   Does not apply q1800  . DISCONTD: levalbuterol  0.63 mg Nebulization Q6H  . DISCONTD: warfarin  1.5 mg Oral ONCE-1800   Continuous Infusions:  PRN Meds:.acetaminophen, acetaminophen, albuterol, alum & mag hydroxide-simeth, guaiFENesin-dextromethorphan, HYDROcodone-acetaminophen, iohexol, levalbuterol, naloxone, ondansetron (ZOFRAN) IV, ondansetron, RESOURCE THICKENUP CLEAR, sodium chloride  Labs:  CMP     Component Value Date/Time   NA 139 02/14/2012 0520   K 3.6 02/14/2012 0520    CL 99 02/14/2012 0520   CO2 31 02/14/2012 0520   GLUCOSE 105* 02/14/2012 0520   BUN 19 02/14/2012 0520   CREATININE 1.23 02/14/2012 0520   CREATININE 0.96 04/03/2011 0857   CALCIUM 8.4 02/14/2012 0520   PROT 6.5 02/08/2012 0350   ALBUMIN 1.7* 02/08/2012 0350   AST 11 02/08/2012 0350   ALT 7 02/08/2012 0350   ALKPHOS 78 02/08/2012 0350   BILITOT 0.2* 02/08/2012 0350   GFRNONAA 55* 02/14/2012 0520   GFRAA 64* 02/14/2012 0520     Intake/Output Summary (Last 24 hours) at 02/15/12 1538 Last data filed at 02/15/12 0500  Gross per 24 hour  Intake    560 ml  Output   1650 ml  Net  -1090 ml    CBG (last 3)   Basename 02/15/12 1244 02/15/12 0722 02/14/12 2036  GLUCAP 106* 149* 150*    Weight Status:  51.5 kg trending down  Re-estimated needs:  1925-2200 kcals, 85-100 gm protein  Nutrition Dx:  Inadequate Oral Intake, ongoing  Goal:  Oral intake with meals & supplements to meet >/= 90% of estimated nutrition needs, unmet.  New Goal:  Intake of meals, supplements, and TF to meet > 90% of estimated nutrition needs.  Monitor:  TF tolerance/adequacy, PO & supplemental intake, weight, labs, I/O's   Joaquin Courts, RD, LDN, CNSC Pager# [phone]  After Hours Pager# [phone]

## 2012-02-15 NOTE — Progress Notes (Signed)
He was downstairs getting DH tube placed in fluoro.    Please begin TFs (bolus dosing around mealtime) after [facility] placement and educate him and his wife how to do this at home.  Hopefully he can go home tomorrow and then follow up in my office in 3-4 weeks to assess swallowing, hopefully weight gain in the interim.

## 2012-02-16 ENCOUNTER — Inpatient Hospital Stay (HOSPITAL_COMMUNITY): Payer: Medicare Other

## 2012-02-16 LAB — GLUCOSE, CAPILLARY
Glucose-Capillary: 153 mg/dL — ABNORMAL HIGH (ref 70–99)
Glucose-Capillary: 182 mg/dL — ABNORMAL HIGH (ref 70–99)

## 2012-02-16 LAB — BASIC METABOLIC PANEL
Calcium: 8.7 mg/dL (ref 8.4–10.5)
GFR calc Af Amer: 64 mL/min — ABNORMAL LOW (ref >90)
GFR calc non Af Amer: 55 mL/min — ABNORMAL LOW (ref >90)
Glucose, Bld: 159 mg/dL — ABNORMAL HIGH (ref 70–99)
Potassium: 3.5 mEq/L (ref 3.5–5.1)
Sodium: 142 mEq/L (ref 135–145)

## 2012-02-16 LAB — CBC
Hemoglobin: 9.5 g/dL — ABNORMAL LOW (ref 13.0–17.0)
MCH: 27.9 pg (ref 26.0–34.0)
Platelets: 244 10*3/uL (ref 150–400)
RBC: 3.41 MIL/uL — ABNORMAL LOW (ref 4.22–5.81)
WBC: 10.6 10*3/uL — ABNORMAL HIGH (ref 4.0–10.5)

## 2012-02-16 LAB — PROTIME-INR: Prothrombin Time: 16.8 seconds — ABNORMAL HIGH (ref 11.6–15.2)

## 2012-02-16 LAB — PRO B NATRIURETIC PEPTIDE: Pro B Natriuretic peptide (BNP): 5004 pg/mL — ABNORMAL HIGH (ref 0–450)

## 2012-02-16 MED ORDER — WARFARIN SODIUM 5 MG PO TABS
5.0000 mg | ORAL_TABLET | Freq: Once | ORAL | Status: AC
Start: 2012-02-16 — End: 2012-02-16
  Administered 2012-02-16: 5 mg via ORAL
  Filled 2012-02-16: qty 1

## 2012-02-16 MED ORDER — IOHEXOL 300 MG/ML  SOLN: 50.0000 mL | Freq: Once | INTRAMUSCULAR | Status: AC | PRN

## 2012-02-16 NOTE — Progress Notes (Signed)
[facility] Progress Note  HPI: [name] is a 79/M , with multiple co-morbidities who presented with  He has had diarrhea for 2-3 weeks ever since he left nursing home. 4-5 BM a day , for the past one week it has become severe. He is getting very weak and unable to walk or even transfer to the whealchair. His home health nurse called his primary care doctor and he was sent to emergency department. in ED He was found to be severely dehydrated and hypotensive. Denies any blood in the stool it is very watery no improvement with imodium.  He has indwelling catheter since 2006 since prostate cancer. 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  Subsequently his diarrhea/hypotension and ARF have resolved with IVF and received PRBC transfusion too, he was also getting evaluated for dysphagia   Assessment/Plan:  Main Issue: Acute diastolic heart failure: Cause of INR being up. With increased diuretics, patient's BNP has come down to 3500, but then today increased to 5000.. Will continue diuretics and recheck BNP in the morning. Not properly tracking intake and output. So we'll continue to do so.  Aspiration PNA: Day 6/10 IV abx.  WBC ct near normal.  Given allergies, I spoke to pharmacy and they are recommending that he'll need to continue IV antibiotics for his full course. Fortunately with dietary restrictions, pneumonia is responding to antibiotics.  1. Diarrhea - resolved, possibly viral gastroenteritis appears to have run its course , DC IVF, Stool Cx negative & C.difficile PCR -negative    2. DIABETES MELLITUS, TYPE II - sliding scale   3.. Atrial fibrillation - metoprolol and digoxin on hold given bradycardia, HR better as digoxin gets out of system, held warfarin initially because of aspiration issues. Has since restarted. INR not yet therapeutic.  4 . Hypotension/ Dehydration - secondary to volume depletion, diarrhea, improved, stopped IV fluids because of  volume overload and monitor her urine output   5. Anemia-acute on chronic - anemia panel unremarkable, suspect chronic disease and worsened by acute illness, last EGD/Colonoscopy and capsule endoscopy 2 years ago, unable to find source of blood loss per wife  Transfused 2units PRBC 9/1, FU with hematology as outpatient  No overt blood loss   5. Hypocalcemia - will replace   6. Acute renal failure - - improved, stopped IVF   7. Coagulopathy: secondary to malnutrition/diarrhea/Vit K defi and coumadin, received Vit K on admission,. Main cause of volume overload. Once we diuresed him, his INR dropped. Have since restarted Coumadin.   8. Debility - s/p Pt/OT eval, decline SNF although may need  9. Dysphagia with persistent coughing with meals:  MBS in 5/23 with esophageal and pharyngeal dysphagia .  Achalasia seen with endoscopy this admission and repeat modified barium swallow, we have started the patient on a dysphagia 3 mechanical soft diet with nectar thick liquids. Gastroenterology followed up with the patient and was concerned that with such severe restrictions, the patient was banned all day long eating slowly and not did not proper caloric intake. It was felt best in a short-term to place a panda 2 which was done today and supplemental tube feedings can be started. A better size tube is being replaced today. Nutrition was consult of and recommended Osmolite 1.2 with a goal rate of 70 mL as per hour running tube feeds at night from 7P to 78 at home.  10. Asymptomatic bacteruria:  Urine cultures with [facility] but patient completely asymptomatic from this at  this time, hence did not treat. It will be covered anyway by his antibiotics for his pneumonia.   CODE STATUS: DNR/DNI as per patient and family wishes  Ambulate, PT eval  Pt and wife decline SNF, home with [facility] services . Hopefully discharging home tomorrow LOS: 6 days   [facility]  Pager: [phone]  02/11/2012, 12:59  PM    Subjective: Patient feeling okay. No shortness of breath. Seems to be tolerating nasogastric tube  Objective: Vital signs in last 24 hours: Temp:  [97.9 F (36.6 C)-98.6 F (37 C)] 98 F (36.7 C) (09/08 0600) Pulse Rate:  [70-80] 72  (09/08 0600) Resp:  [16] 16  (09/08 0600) BP: (96-115)/(58-66) 115/66 mmHg (09/08 0600) SpO2:  [95 %-98 %] 96 % (09/08 0941) Weight:  [49.941 kg (110 lb 1.6 oz)] 49.941 kg (110 lb 1.6 oz) (09/08 0600) Weight change: -1.588 kg (-3 lb 8 oz) Last BM Date: 02/12/12  Intake/Output from previous day: 09/07 0701 - 09/08 0700 In: -  Out: 725 [Urine:725] Total I/O In: 123 [P.O.:120; I.V.:3] Out: -    Physical Exam: General: Alert, awake, oriented x3, in no acute distress. HEENT: No bruits, no goiter. Panda tube in place Heart: Regular rate and rhythm, without murmurs, rubs, gallops. Lungs: faint basilar crackles, poor respiratory effort Abdomen: Soft, nontender, nondistended, positive bowel sounds. Extremities: L BKA, small opening of sutures at medial end Neuro: Grossly intact, nonfocal.    Lab Results: Basic Metabolic Panel:  Basename 02/16/12 0500 02/14/12 0520  NA 142 139  K 3.5 3.6  CL 99 99  CO2 36* 31  GLUCOSE 159* 105*  BUN 23 19  CREATININE 1.22 1.23  CALCIUM 8.7 8.4  MG -- --  PHOS -- --   CBC:  Basename 02/16/12 0500 02/14/12 0520  WBC 10.6* 11.8*  NEUTROABS -- --  HGB 9.5* 9.4*  HCT 30.3* 29.1*  MCV 88.9 86.6  PLT 244 271   BNP:  Basename 02/16/12 0500 02/15/12 1147 02/14/12 0846  PROBNP 5004.0* 3751.0* 6094.0*   CBG:  Basename 02/16/12 0750 02/16/12 0544 02/15/12 2119 02/15/12 1923 02/15/12 1244 02/15/12 0722  GLUCAP 182* 153* 142* 154* 106* 149*   Coagulation:  Basename 02/16/12 0500 02/15/12 0637  LABPROT 16.8* 17.0*  INR 1.34 1.36                           URINE CULTURE     Status: Normal   Collection Time   02/08/12  4:15 AM      Component Value Range Status Comment   Specimen  Description URINE, SUPRAPUBIC   Final    Special Requests NONE   Final    Culture  Setup Time 02/08/2012 11:21   Final    Colony Count >=100,000 COLONIES/ML   Final    Culture ESCHERICHIA COLI   Final    Report Status 02/10/2012 FINAL   Final    Organism ID, Bacteria ESCHERICHIA COLI   Final     Studies/Results: Dg Chest Port 1 View 02/14/2012    IMPRESSION: Tip of right arm PICC line projects over SVC. Bibasilar atelectasis versus infiltrate. Improved aeration since 02/11/2012.   Original Report Authenticated By: Lollie Marrow, M.D.    Dg Naso G Tube Plc W/fl W/rad  02/15/2012    Impression:  Successful fluoroscopic guided placement of NG tube with tip terminating within the gastric antrum/duodenal bulb.   Original Report Authenticated By: Judene Companion.D.  Medications: Scheduled Meds:    . atorvastatin  40 mg Oral QHS  . cefTAZidime (FORTAZ)  IV  1 g Intravenous Q12H  . feeding supplement  1 Container Oral TID BM  . furosemide  40 mg Intravenous Q8H  . insulin aspart  0-9 Units Subcutaneous TID WC & HS  . levalbuterol  0.63 mg Nebulization TID  . magnesium oxide  400 mg Oral Daily  . OLANZapine  5 mg Oral QHS  . olanzapine-FLUoxetine  1 capsule Oral QHS  . pantoprazole  40 mg Oral Q1200  . sodium chloride  3 mL Intravenous Q12H  . vancomycin  500 mg Intravenous Q12H  . warfarin  3 mg Oral ONCE-1800  . warfarin  5 mg Oral ONCE-1800  . Warfarin - Pharmacist Dosing Inpatient   Does not apply q1800   Continuous Infusions:    . DISCONTD: feeding supplement (OSMOLITE 1.2 CAL) 1,000 mL (02/15/12 1913)   PRN Meds:.acetaminophen, acetaminophen, albuterol, alum & mag hydroxide-simeth, guaiFENesin-dextromethorphan, HYDROcodone-acetaminophen, iohexol, iohexol, levalbuterol, naloxone, ondansetron (ZOFRAN) IV, ondansetron, RESOURCE THICKENUP CLEAR, sodium chloride  Time spent:  30  minutes   LOS: 9 days   Hollice Espy [facility] Pager: [phone] 02/16/2012, 12:37  PM

## 2012-02-16 NOTE — Progress Notes (Signed)
Pt went down to radiology for 38fr.  Feeding Tube palcement, return to floor and osmolite started at 60cc/hr. Tolerating fdgs fine until accidentally pulled tube out while blowing wiping nose. TC MD will leave tube out until AM. Case manager cx in regards to needing DME and supplies for feeding at home. She will leave message for f/u for case manager in am Dr. Rito Ehrlich following up with urologist to change suprapubic prior to discharge to home. Spoke with pt in regards to N/G vs GT placement and reminded they have option for GT per Dr. Christella Hartigan if N/G not working for them. They are interested in pursuing GT placement and will talk to Dr. Christella Hartigan in AM for his recommendation and opinion

## 2012-02-16 NOTE — Progress Notes (Signed)
ANTICOAGULATION and ANTIBIOTIC CONSULT NOTE - Follow Up Consult  Pharmacy Consult for Coumadin, vancomycin, ceftazidime Indication: atrial fibrillation, aspiration pneumonia  Allergies  Allergen Reactions  . Levaquin (Levofloxacin) Rash  . Morphine And Related Other (See Comments)    Confusion, agitation  . Codeine Nausea And Vomiting    Noted on MAR  . Penicillins Rash    Noted on [facility]    Patient Measurements: Height: 5\' 9"  (175.3 cm) Weight: 110 lb 1.6 oz (49.941 kg) IBW/kg (Calculated) : 70.7   Vital Signs: Temp: 98 F (36.7 C) (09/08 0600) Temp src: Axillary (09/07 2130) BP: 115/66 mmHg (09/08 0600) Pulse Rate: 72  (09/08 0600)  Labs:  Basename 02/16/12 0500 02/15/12 0637 02/14/12 0520  HGB 9.5* -- 9.4*  HCT 30.3* -- 29.1*  PLT 244 -- 271  APTT -- -- --  LABPROT 16.8* 17.0* 21.2*  INR 1.34 1.36 1.80*  HEPARINUNFRC -- -- --  CREATININE 1.22 -- 1.23  CKTOTAL -- -- --  CKMB -- -- --  TROPONINI -- -- --    Estimated Creatinine Clearance: 35.8 ml/min (by C-G formula based on Cr of 1.22).   Assessment: 78 yo M on Coumadin PTA (4.5mg  Tu/F, 3mg  other days) for atrial fibrillation. INR was SUPRA-therapeutic on admission therefore Coumadin was held and Vit K administered. Coumadin had remained on hold for GI procedures and tube placements and was restarted 9/7.   INR SUB-therapeutic at 1.34 after 3mg  x 1 last night. H/H 9.5/30.3 and stable, plts 271. No bleeding noted per chart.  Pt currently on D# 6/10 IV vancomycin and ceftazidime for urosepsis verses aspiration pneumonia. WBC down to 10.6, pt is afebrile. SCr currently stable at 1.22 with est CrCl 35 mL/min. Vancomycin trough drawn 9/5 was in therapeutic range at 18.9 (goal 15-20). Vancomycin and ceftazidime doses are appropriate for the patient's renal function and indication.  9/3 bcx x2>>NG FINAL 8/31 ucx>> EColi FINAL 9/1 cdif PCR >> neg FINAL    Goal of Therapy:  INR 2-3 Monitor platelets by  anticoagulation protocol: Yes Vancomycin trough level 15-20 mcg/mL   Plan:  - Coumadin 5 mg PO x1 tonight to try to get back into therapeutic range - F/u INR in AM  - F/u CBC at least q72h - Continue vancomycin 500 mg IV q12 hrs and ceftazidime 1g IV q12h - F/u clinical course, fever curve - F/u d/c plans  Thank you for the consult.  Tomi Bamberger, PharmD Clinical Pharmacist Pager: [phone] Pharmacy: [phone] 02/16/2012 9:33 AM

## 2012-02-16 NOTE — Progress Notes (Signed)
[facility] Progress Note    Since last GI note: I spoke with someone in radiology yesterday and was walked through steps to order a Dobhoff tube through [employer].  A 14 French NG tube was placed instead.  This is too big to stay in for an extended period (hoping for it to stay in for 4-5 weeks, see my progress note yesterday).  I spoke with another [employer] rep this AM and entered another order to place a Dobhoff (panda) tube in today and remove the larger bore NG tube.  The patient is obvious a bit frustrated by this.  Objective: Vital signs in last 24 hours: Temp:  [97.9 F (36.6 C)-98.6 F (37 C)] 98 F (36.7 C) (09/08 0600) Pulse Rate:  [70-80] 72  (09/08 0600) Resp:  [16] 16  (09/08 0600) BP: (96-115)/(58-66) 115/66 mmHg (09/08 0600) SpO2:  [95 %-98 %] 95 % (09/08 0600) Weight:  [110 lb 1.6 oz (49.941 kg)] 110 lb 1.6 oz (49.941 kg) (09/08 0600) Last BM Date: 02/12/12 General: alert and oriented times 3 Heart: regular rate and rythm Abdomen: soft, non-tender, non-distended, normal bowel sounds   Lab Results:  Basename 02/16/12 0500 02/14/12 0520  WBC 10.6* 11.8*  HGB 9.5* 9.4*  PLT 244 271  MCV 88.9 86.6    Basename 02/16/12 0500 02/14/12 0520  NA 142 139  K 3.5 3.6  CL 99 99  CO2 36* 31  GLUCOSE 159* 105*  BUN 23 19  CREATININE 1.22 1.23  CALCIUM 8.7 8.4   No results found for this basename: PROT:3,ALBUMIN:3,AST:3,ALT:3,ALKPHOS:3,BILITOT:3,BILIDIR:3,IBILI:3 in the last 72 hours  Basename 02/16/12 0500 02/15/12 0637  INR 1.34 1.36     Studies/Results: Dg Naso G Tube Plc W/fl W/rad  02/15/2012  *RADIOLOGY REPORT*  Indication: Malnutrition, aspiration risk  FLOUROSCOPIC GUIDED PLACEMENT OF DOBBOHOFF TUBE  Comparisons: Barium swallow examination - 02/11/2012  Contrast: 50 ml Omnipaque-300 administered into the gastric lumen.  Fluoroscopy Time: 8.46 minutes.  Complications: None immediate  Technique / Findings:  The NG tube was lubricated with viscous  lidocaine inserted into the right nostril.  Under intermittent fluoroscopic guidance, the Dobbhoff tube was advanced through the stomach. Despite prolonged efforts, the tube could not be advanced past the location of the gastric antrum/duodenal bulb.  A spot fluoroscopic image was saved for documentation purposes.  The tube was affixed to the patient's nose with tape.  The patient tolerated the procedure well without immediate postprocedural complication.  Impression:  Successful fluoroscopic guided placement of NG tube with tip terminating within the gastric antrum/duodenal bulb.   Original Report Authenticated By: Waynard Reeds, M.D.      Medications: Scheduled Meds:   . atorvastatin  40 mg Oral QHS  . cefTAZidime (FORTAZ)  IV  1 g Intravenous Q12H  . feeding supplement  1 Container Oral TID BM  . furosemide  40 mg Intravenous Q8H  . insulin aspart  0-9 Units Subcutaneous TID WC & HS  . levalbuterol  0.63 mg Nebulization TID  . magnesium oxide  400 mg Oral Daily  . OLANZapine  5 mg Oral QHS  . olanzapine-FLUoxetine  1 capsule Oral QHS  . pantoprazole  40 mg Oral Q1200  . sodium chloride  3 mL Intravenous Q12H  . vancomycin  500 mg Intravenous Q12H  . warfarin  3 mg Oral ONCE-1800  . Warfarin - Pharmacist Dosing Inpatient   Does not apply q1800   Continuous Infusions:   . feeding supplement (OSMOLITE 1.2 CAL) 1,000 mL (  02/15/12 1913)   PRN Meds:.acetaminophen, acetaminophen, albuterol, alum & mag hydroxide-simeth, guaiFENesin-dextromethorphan, HYDROcodone-acetaminophen, iohexol, levalbuterol, naloxone, ondansetron (ZOFRAN) IV, ondansetron, RESOURCE THICKENUP CLEAR, sodium chloride    Assessment/Plan: 78 y.o. male with inadequate PO intake due to mixed esophageal, OP dysphagia  Will try again to have Dobhoff (aka Panda tube) placed today.  I have written another order for this while [facility] was on phone.  I will speak with floor staff to try to make sure correct tube is sent down  with him.      Rob Bunting, MD  02/16/2012, 7:21 AM [facility] Pager [phone]

## 2012-02-17 DIAGNOSIS — I714 Abdominal aortic aneurysm, without rupture: Secondary | ICD-10-CM

## 2012-02-17 LAB — CULTURE, BLOOD (ROUTINE X 2): Culture: NO GROWTH

## 2012-02-17 LAB — GLUCOSE, CAPILLARY
Glucose-Capillary: 144 mg/dL — ABNORMAL HIGH (ref 70–99)
Glucose-Capillary: 204 mg/dL — ABNORMAL HIGH (ref 70–99)

## 2012-02-17 LAB — PROTIME-INR: Prothrombin Time: 17.4 seconds — ABNORMAL HIGH (ref 11.6–15.2)

## 2012-02-17 MED ORDER — WARFARIN SODIUM 5 MG PO TABS
5.0000 mg | ORAL_TABLET | Freq: Once | ORAL | Status: AC
  Administered 2012-02-17: 5 mg via ORAL
  Filled 2012-02-17: qty 1

## 2012-02-17 MED ORDER — BISACODYL 5 MG PO TBEC
5.0000 mg | DELAYED_RELEASE_TABLET | Freq: Once | ORAL | Status: DC
  Filled 2012-02-17: qty 1

## 2012-02-17 NOTE — Progress Notes (Signed)
[facility] Progress Note  Subjective:  NGT fell out.  Patient says that he wants a PEG tube instead.  Feels well otherwise and seems in good spirits.  No BM in several days.  Objective:  Vital signs in last 24 hours: Temp:  [98 F (36.7 C)-98.7 F (37.1 C)] 98.3 F (36.8 C) (09/09 0400) Pulse Rate:  [69-94] 94  (09/09 0400) Resp:  [17-20] 18  (09/09 0400) BP: (92-107)/(50-66) 107/66 mmHg (09/09 0400) SpO2:  [93 %-97 %] 93 % (09/09 0400) Weight:  [104 lb 4.4 oz (47.3 kg)] 104 lb 4.4 oz (47.3 kg) (09/09 0400) Last BM Date: 02/13/12 General:   Alert, thin, in NAD Heart:  Regular rate and rhythm; no murmurs Pulm:  Coarse lung sounds B/L. Abdomen:  Soft, nontender and nondistended. Normal bowel sounds, without guarding, and without rebound.  Multiple scars on abdomen. Suprapubic cath. Extremities:  Without edema.  Right BKA with bandage. Neurologic:  Alert and  oriented x4;  grossly normal neurologically. Psych:  Alert and cooperative. Normal mood and affect.  Intake/Output from previous day: 09/08 0701 - 09/09 0700 In: 653 [P.O.:320; I.V.:3; NG/GT:330] Out: 1350 [Urine:1350]  Lab Results:  Basename 02/16/12 0500  WBC 10.6*  HGB 9.5*  HCT 30.3*  PLT 244   BMET  Basename 02/16/12 0500  NA 142  K 3.5  CL 99  CO2 36*  GLUCOSE 159*  BUN 23  CREATININE 1.22  CALCIUM 8.7   PT/INR  Basename 02/17/12 0530 02/16/12 0500  LABPROT 17.4* 16.8*  INR 1.40 1.34    Dg Naso G Tube Plc W/fl W/rad  02/15/2012  *RADIOLOGY REPORT*  Indication: Malnutrition, aspiration risk  FLOUROSCOPIC GUIDED PLACEMENT OF DOBBOHOFF TUBE  Comparisons: Barium swallow examination - 02/11/2012  Contrast: 50 ml Omnipaque-300 administered into the gastric lumen.  Fluoroscopy Time: 8.46 minutes.  Complications: None immediate  Technique / Findings:  The NG tube was lubricated with viscous lidocaine inserted into the right nostril.  Under intermittent fluoroscopic guidance, the Dobbhoff tube was  advanced through the stomach. Despite prolonged efforts, the tube could not be advanced past the location of the gastric antrum/duodenal bulb.  A spot fluoroscopic image was saved for documentation purposes.  The tube was affixed to the patient's nose with tape.  The patient tolerated the procedure well without immediate postprocedural complication.  Impression:  Successful fluoroscopic guided placement of NG tube with tip terminating within the gastric antrum/duodenal bulb.   Original Report Authenticated By: Waynard Reeds, M.D.    Dg Intro Long Gi Tube  02/16/2012  *RADIOLOGY REPORT*  Clinical Data: 22 French nasogastric tube placed yesterday.  The referring physician and request exchange of the 14-French tube for a 10-French nasogastric tube.  The  INTRO LONG GI TUBE  Technique: Exchange of nasogastric tube under fluoroscopic guidance  Comparison:  NG tube placement images from yesterday, 02/15/2012  Findings: Successful removal of existing nasogastric tube and placement of a new 10-French tube under fluoroscopic guidance.  The tip of the tube is in the peripyloric gastric antrum.  Position was confirmed with contrast injection under fluoroscopy.  IMPRESSION:  Successful exchange of 14 French nasogastric tube for a 10-French nasogastric tube.  The tip of the tube is in the peripyloric gastric antrum.   Original Report Authenticated By: HEATH    Assessment / Plan: -78 y.o. male with inadequate PO intake due to mixed esophageal, OP dysphagia.  NGT fell out.  Patient wants PEG placed instead (for enteral nutrition with hopes that  as he becomes stronger his swallowing function will improve.)  Will follow-up as outpt with Dr. Christella Hartigan in 4 weeks to re-assess.  Will try to plan for PEG placement 9/10. -Anemia-s/p 2 units PRBC's on 9/1 with fairly stable Hgb since that time. Was evaluated extensively with his routine GI physician as an outpatient previously. No evidence of GIB currently. To see heme/onc as  well.   LOS: 10 days   ZEHR, JESSICA D.  02/17/2012, 8:52 AM  Pager number [phone]

## 2012-02-17 NOTE — Progress Notes (Signed)
[facility] Progress Note  HPI: [name] is a 79/M , with multiple co-morbidities who presented with  He has had diarrhea for 2-3 weeks ever since he left nursing home. 4-5 BM a day , for the past one week it has become severe. He is getting very weak and unable to walk or even transfer to the whealchair. His home health nurse called his primary care doctor and he was sent to emergency department. in ED He was found to be severely dehydrated and hypotensive. Denies any blood in the stool it is very watery no improvement with imodium.  He has indwelling catheter since 2006 since prostate cancer. 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  Subsequently his diarrhea/hypotension and ARF have resolved with IVF and received PRBC transfusion too, he was also getting evaluated for dysphagia   Assessment/Plan:  Main Issue: Acute diastolic heart failure: Cause of INR being up. With increased diuretics, patient's BNP has come down to 3500, but then yesterday increased to 5000.. Will continue diuretics and recheck BNP in the morning. Not properly tracking intake and output. Recheck level in the morning  Aspiration PNA: Day 7/10 IV abx.  WBC ct near normal.  Given allergies, I spoke to pharmacy and they are recommending that he'll need to continue IV antibiotics for his full course. Fortunately with dietary restrictions, pneumonia is responding to antibiotics.  1. Diarrhea - resolved, possibly viral gastroenteritis appears to have run its course , DC IVF, Stool Cx negative & C.difficile PCR -negative    2. DIABETES MELLITUS, TYPE II - sliding scale   3.. Atrial fibrillation - metoprolol and digoxin on hold given bradycardia, HR better as digoxin gets out of system, held warfarin initially because of aspiration issues. Has since restarted. INR not yet therapeutic.  4 . Hypotension/ Dehydration - secondary to volume depletion, diarrhea, improved, stopped IV fluids because  of volume overload and monitor her urine output   5. Anemia-acute on chronic - anemia panel unremarkable, suspect chronic disease and worsened by acute illness, last EGD/Colonoscopy and capsule endoscopy 2 years ago, unable to find source of blood loss per wife  Transfused 2units PRBC 9/1, FU with hematology as outpatient  No overt blood loss   5. Hypocalcemia - will replace   6. Acute renal failure - - improved, stopped IVF   7. Coagulopathy: secondary to malnutrition/diarrhea/Vit K defi and coumadin, received Vit K on admission,. Main cause of volume overload. Once we diuresed him, his INR dropped. Have since restarted Coumadin.   8. Debility - s/p Pt/OT eval, decline SNF although may need  9. Dysphagia with persistent coughing with meals:  MBS in 5/23 with esophageal and pharyngeal dysphagia .  Achalasia seen with endoscopy this admission and repeat modified barium swallow, we have started the patient on a dysphagia 3 mechanical soft diet with nectar thick liquids. Gastroenterology followed up with the patient and was concerned that with such severe restrictions, the patient was banned all day long eating slowly and not did not proper caloric intake. It was felt best in a short-term to place a panda 2 which was done yesterday, but soon after tube feedings started, patient accidentally dislodged the panda tube. Some discussion with him and his wife, the patient is now amenable to a PEG tube which which he had previously refused. GI followup and plans to take the patient tomorrow for PEG tube.  10. Asymptomatic bacteruria:  Urine cultures with [name] but patient completely asymptomatic from this  at this time, hence did not treat. It will be covered anyway by his antibiotics for his pneumonia.   CODE STATUS: DNR/DNI as per patient and family wishes  Ambulate, PT eval  Pt and wife decline SNF, home with [facility] services . Hopefully discharging home tomorrow LOS: 6 days   [facility]  Pager:  [phone]  02/11/2012, 12:59 PM    Subjective: Patient feeling okay. No shortness of breath. No complaints Objective: Vital signs in last 24 hours: Temp:  [98.2 F (36.8 C)-98.7 F (37.1 C)] 98.2 F (36.8 C) (09/09 1400) Pulse Rate:  [69-94] 84  (09/09 1400) Resp:  [18-20] 18  (09/09 1400) BP: (95-107)/(60-66) 95/60 mmHg (09/09 1400) SpO2:  [93 %-97 %] 96 % (09/09 1501) Weight:  [47.3 kg (104 lb 4.4 oz)] 47.3 kg (104 lb 4.4 oz) (09/09 0400) Weight change: -2.641 kg (-5 lb 13.2 oz) Last BM Date: 02/13/12  Intake/Output from previous day: 09/08 0701 - 09/09 0700 In: 653 [P.O.:320; I.V.:3; NG/GT:330] Out: 1350 [Urine:1350] Total I/O In: 240 [P.O.:240] Out: -    Physical Exam: General: Alert, awake, oriented x3, in no acute distress. HEENT: No bruits, no goiter. Panda tube in place Heart: Regular rate and rhythm, without murmurs, rubs, gallops. Lungs: faint basilar crackles, poor respiratory effort Abdomen: Soft, nontender, nondistended, positive bowel sounds. Extremities: L BKA, small opening of sutures at medial end Neuro: Grossly intact, nonfocal.  No change from yesterday  Lab Results: Basic Metabolic Panel:  Basename 02/16/12 0500  NA 142  K 3.5  CL 99  CO2 36*  GLUCOSE 159*  BUN 23  CREATININE 1.22  CALCIUM 8.7  MG --  PHOS --   CBC:  Basename 02/16/12 0500  WBC 10.6*  NEUTROABS --  HGB 9.5*  HCT 30.3*  MCV 88.9  PLT 244   BNP:  Basename 02/16/12 0500 02/15/12 1147  PROBNP 5004.0* 3751.0*   CBG:  Basename 02/17/12 1619 02/17/12 1138 02/17/12 0726 02/17/12 0416 02/17/12 0001 02/16/12 2014  GLUCAP 204* 144* 123* 148* 127* 170*   Coagulation:  Basename 02/17/12 0530 02/16/12 0500  LABPROT 17.4* 16.8*  INR 1.40 1.34                           URINE CULTURE     Status: Normal   Collection Time   02/08/12  4:15 AM      Component Value Range Status Comment   Specimen Description URINE, SUPRAPUBIC   Final    Special Requests NONE    Final    Culture  Setup Time 02/08/2012 11:21   Final    Colony Count >=100,000 COLONIES/ML   Final    Culture ESCHERICHIA COLI   Final    Report Status 02/10/2012 FINAL   Final    Organism ID, Bacteria ESCHERICHIA COLI   Final     Studies/Results: Dg Chest Port 1 View 02/14/2012    IMPRESSION: Tip of right arm PICC line projects over SVC. Bibasilar atelectasis versus infiltrate. Improved aeration since 02/11/2012.   Original Report Authenticated By: Lollie Marrow, M.D.    Dg Naso G Tube Plc W/fl W/rad  02/15/2012    Impression:  Successful fluoroscopic guided placement of NG tube with tip terminating within the gastric antrum/duodenal bulb.   Original Report Authenticated By: Waynard Reeds, M.D.     Medications: Scheduled Meds:    . atorvastatin  40 mg Oral QHS  . bisacodyl  5 mg Oral  Once  . cefTAZidime (FORTAZ)  IV  1 g Intravenous Q12H  . feeding supplement  1 Container Oral TID BM  . furosemide  40 mg Intravenous Q8H  . insulin aspart  0-9 Units Subcutaneous TID WC & HS  . levalbuterol  0.63 mg Nebulization TID  . magnesium oxide  400 mg Oral Daily  . OLANZapine  5 mg Oral QHS  . olanzapine-FLUoxetine  1 capsule Oral QHS  . pantoprazole  40 mg Oral Q1200  . sodium chloride  3 mL Intravenous Q12H  . vancomycin  500 mg Intravenous Q12H  . warfarin  5 mg Oral ONCE-1800  . Warfarin - Pharmacist Dosing Inpatient   Does not apply q1800   Continuous Infusions:   PRN Meds:.acetaminophen, acetaminophen, albuterol, alum & mag hydroxide-simeth, guaiFENesin-dextromethorphan, HYDROcodone-acetaminophen, iohexol, levalbuterol, naloxone, ondansetron (ZOFRAN) IV, ondansetron, RESOURCE THICKENUP CLEAR, sodium chloride  Time spent:  20  minutes   LOS: 10 days   Hollice Espy [facility] Pager: [phone] 02/17/2012, 5:54 PM

## 2012-02-17 NOTE — Progress Notes (Signed)
  ANTICOAGULATION CONSULT NOTE - Follow Up Consult  Pharmacy Consult for Coumadin Indication: atrial fibrillation  Allergies  Allergen Reactions  . Levaquin (Levofloxacin) Rash  . Morphine And Related Other (See Comments)    Confusion, agitation  . Codeine Nausea And Vomiting    Noted on MAR  . Penicillins Rash    Noted on [facility]    Patient Measurements: Height: 5\' 9"  (175.3 cm) Weight: 104 lb 4.4 oz (47.3 kg) IBW/kg (Calculated) : 70.7    Vital Signs: Temp: 98.3 F (36.8 C) (09/09 0400) BP: 107/66 mmHg (09/09 0400) Pulse Rate: 94  (09/09 0400)  Labs:  Basename 02/17/12 0530 02/16/12 0500 02/15/12 0637  HGB -- 9.5* --  HCT -- 30.3* --  PLT -- 244 --  APTT -- -- --  LABPROT 17.4* 16.8* 17.0*  INR 1.40 1.34 1.36  HEPARINUNFRC -- -- --  CREATININE -- 1.22 --  CKTOTAL -- -- --  CKMB -- -- --  TROPONINI -- -- --    Estimated Creatinine Clearance: 33.9 ml/min (by C-G formula based on Cr of 1.22).   Assessment: 78 yo M on Coumadin PTA (3mg  daily-dose decreased by Coumadin clinic on 02/04/12, see in Chart review) for atrial fibrillation. Coumadin has been on hold for procedures, now resumed and patient will likely be discharged to home today per GI MD.  INR SUB-therapeutic at 1.40 (incr). H/H 9.5/30.3 and stable, plts 244. No bleeding noted per chart.  Goal of Therapy:  INR 2-3 Monitor platelets by anticoagulation protocol: Yes   Plan:  - Coumadin 5mg  PO x1 tonight then recommend patient go home with 3mg  daily.  - F/u INR in AM if still here. If d/c home, recommend INR in 1 week.  - F/u CBC at least q72h - Coumadin education complete.   Thank you for the consult.  Link Snuffer, PharmD, BCPS Clinical Pharmacist [phone] 02/17/2012 8:54 AM

## 2012-02-17 NOTE — Progress Notes (Signed)
CSW continuing to assist with pt dc plans and assist with non-emergency ambulance transportation. CSW has placed completed PTAR form in pt walaroo for when pt is ready for dc.   .Clinical social worker continuing to follow pt to assist with pt dc plans and further csw needs.   Catha Gosselin, Theresia Majors  [phone] .02/17/2012 10:59am

## 2012-02-17 NOTE — Progress Notes (Signed)
I have personally taken an interval history, reviewed the chart, and examined the patient.  I agree with the extender's note, impression and recommendations.  

## 2012-02-17 NOTE — Progress Notes (Signed)
Physical Therapy Treatment Patient Details Name: [name] MRN: [id number] DOB: 1932-01-26 Today's Date: 02/17/2012 Time: 4098-1191 PT Time Calculation (min): 24 min  PT Assessment / Plan / Recommendation Comments on Treatment Session  Pt admitted s/p SOB with increased INR.  Pt still requiring significant assist and would continue to benefit from d/c to SNF for more therapy prior to d/c home.  Pt tolerated OOB to chair this am, however.    Follow Up Recommendations  Skilled nursing facility    Barriers to Discharge        Equipment Recommendations  Defer to next venue    Recommendations for Other Services    Frequency Min 3X/week   Plan Discharge plan remains appropriate;Frequency remains appropriate    Precautions / Restrictions Precautions Precautions: Fall Restrictions Weight Bearing Restrictions: No   Pertinent Vitals/Pain None    Mobility  Bed Mobility Bed Mobility: Supine to Sit Supine to Sit: 3: Mod assist;HOB flat Details for Bed Mobility Assistance: Assist to rotate trunk and pelvis to EOB as well as translate trunk anterior over BOS.  Cues for sequence and to increase participation today. Transfers Transfers: Sit to Stand;Stand to Sit;Squat Pivot Transfers (5 trials for sit to stand.) Sit to Stand: 2: Max assist;From bed Stand to Sit: 2: Max assist;To bed;To chair/3-in-1 Squat Pivot Transfers: 2: Max assist;From elevated surface Details for Transfer Assistance: Assist to block left LE at knee/foot to increase stability as well as at sacrum to increase anterior/superior translate with cues for sequence.  Limited by weakness causing increased flexion at hips and trunk. Ambulation/Gait Ambulation/Gait Assistance: Not tested (comment) Stairs: No Wheelchair Mobility Wheelchair Mobility: No    Exercises General Exercises - Lower Extremity Ankle Circles/Pumps: AROM;Left;10 reps;Supine Quad Sets: AROM;Both;10 reps;Supine Heel Slides: AROM;Both;10  reps;Supine Hip ABduction/ADduction: AAROM;Both;10 reps;Supine Straight Leg Raises: AAROM;Both;10 reps;Supine   PT Diagnosis:    PT Problem List:   PT Treatment Interventions:     PT Goals Acute Rehab PT Goals PT Goal Formulation: With patient Time For Goal Achievement: 02/24/12 Potential to Achieve Goals: Fair PT Goal: Supine/Side to Sit - Progress: Progressing toward goal PT Transfer Goal: Bed to Chair/Chair to Bed - Progress: Progressing toward goal  Visit Information  Last PT Received On: 02/17/12 Assistance Needed: +2    Subjective Data  Subjective: "I would love to sit up!" Patient Stated Goal: None stated.     Cognition  Overall Cognitive Status: Impaired Area of Impairment: Memory Arousal/Alertness: Awake/alert Orientation Level: Disoriented to;Time Behavior During Session: WFL for tasks performed    Balance  Balance Balance Assessed: Yes Static Sitting Balance Static Sitting - Balance Support: Bilateral upper extremity supported;Feet supported Static Sitting - Level of Assistance: 3: Mod assist Static Sitting - Comment/# of Minutes: Able to sit EOB for 8 minutes total with up to mod assist due to posterior lean.  Cues to shift weight anterior with assist to attain/maintain midline trunk.  End of Session PT - End of Session Equipment Utilized During Treatment: Gait belt Activity Tolerance: Patient tolerated treatment well Patient left: in chair;with call bell/phone within reach;Other (comment) (With CNA in room) Nurse Communication: Mobility status;Need for lift equipment   GP     Cephus Shelling 02/17/2012, 7:59 AM  02/17/2012 Cephus Shelling, PT, DPT [phone]

## 2012-02-17 NOTE — Progress Notes (Signed)
Speech Language Pathology Dysphagia Treatment Patient Details Name: [name] MRN: [id number] DOB: Oct 24, 1931 Today's Date: 02/17/2012 Time: 6440-3474 SLP Time Calculation (min): 12 min  Assessment / Plan / Recommendation Clinical Impression  Spoke with GI MD via phone on Friday regarding options for alternative means of nutrition in order to faciliate improved nutrition for hopeful recovery of some swallowing function. Noted now plans for PEG tiube placement tomorrow per patient request instead of replacing NG tube. Educated patient and wife regarding need to continue either ice chips after oral care or small amounts of pleausre pos (dysphagia 3, nectar thick liquids utilizing strict compensatory strategies) in conjunction with PEG tube placement to prevent atrophy of pharyngeal musculature. Both verbalized understanding of education and use of strategies however had to continue to be verbally cued to also utilize hard throat clear and dry swallow without pos to clear wet vocal quality indicative of penetration and/or aspiration of secretions. Also provided edudcation regarding importance of oral care to decrease risk of PNA related to aspiration of secretions. SLP will continue to f/u.      Diet Recommendation  Continue with Current Diet: Dysphagia 3 (mechanical soft);Nectar-thick liquid    SLP Plan Continue with current plan of care   Pertinent Vitals/Pain n/a   Swallowing Goals  SLP Swallowing Goals Patient will utilize recommended strategies during swallow to increase swallowing safety with: Minimal assistance Swallow Study Goal #2 - Progress: Progressing toward goal  General Temperature Spikes Noted: No Respiratory Status: Room air Behavior/Cognition: Alert;Cooperative Oral Cavity - Dentition: Dentures, top;Dentures, bottom Patient Positioning: Upright in chair      Dysphagia Treatment Treatment focused on: Utilization of compensatory strategies;Patient/family/caregiver  Dealer Educated: spouse, RN Patient observed directly with PO's: No Reason PO's not observed: NPO for procedure/test (plans for PEG tube placement tomorrow 9/10) Feeding: Able to feed self Pharyngeal Phase Signs & Symptoms: Wet vocal quality Type of cueing: Verbal Amount of cueing: Moderate   GO   Ferdinand Lango MA, CCC-SLP [phone]   [name] 02/17/2012, 11:47 AM

## 2012-02-17 NOTE — Progress Notes (Signed)
Occupational Therapy Treatment Patient Details Name: [name] MRN: [id number] DOB: April 18, 1932 Today's Date: 02/17/2012 Time: 4098-1191 OT Time Calculation (min): 25 min  OT Assessment / Plan / Recommendation Comments on Treatment Session Pt requires a great amount of assist for all mobility.  Spoke with family at length about pt going to SNF instead of home.  Family desires home.  Feel this pt needs to increase mobilty and strength before returning home.    Follow Up Recommendations  Home health OT;Other (comment) (feel pt needs snf but family refusing.)    Barriers to Discharge       Equipment Recommendations  None recommended by OT    Recommendations for Other Services    Frequency Min 2X/week   Plan Discharge plan needs to be updated    Precautions / Restrictions Precautions Precautions: Fall Precaution Comments: Pt is a high fall risk due to amputation and need for max assist to transfer. Restrictions Weight Bearing Restrictions: No   Pertinent Vitals/Pain Pt c/o no pain.    ADL  Grooming: Performed;Set up Where Assessed - Grooming: Supported sitting Toilet Transfer: Performed;Maximal Dentist: Patient Percentage: 30% Statistician Method: Ambulance person: Materials engineer and Hygiene: Performed;+2 Total assistance Toileting - Architect and Hygiene: Patient Percentage: 20% Where Assessed - Engineer, mining and Hygiene: Sit to stand from 3-in-1 or toilet Equipment Used: Gait belt Transfers/Ambulation Related to ADLs: Pt transferred to commode and back with max assist. ADL Comments: Pt fairly dependent with LE adls.  Therapist has great concerns about this pt going home due to the great amount of assist he needs for all adls and mobility.    OT Diagnosis:    OT Problem List:   OT Treatment Interventions:     OT Goals Acute Rehab OT Goals OT Goal Formulation:  With patient/family Time For Goal Achievement: 02/24/12 Potential to Achieve Goals: Good ADL Goals Pt Will Perform Grooming: with set-up;Sitting, chair;Supported ADL Goal: Grooming - Progress: Progressing toward goals Pt Will Transfer to Toilet: with min assist;3-in-1;Stand pivot transfer ADL Goal: Toilet Transfer - Progress: Progressing toward goals  Visit Information  Last OT Received On: 02/17/12 Assistance Needed: +2    Subjective Data      Prior Functioning       Cognition  Overall Cognitive Status: Impaired Area of Impairment: Memory Arousal/Alertness: Awake/alert Orientation Level: Disoriented to;Time Behavior During Session: WFL for tasks performed Memory: Decreased recall of precautions Memory Deficits: Pt gets easily confused about his medical history and home situation.    Mobility  Shoulder Instructions Transfers Transfers: Sit to Stand;Stand to Sit Sit to Stand: 2: Max assist;From bed Sit to Stand: Patient Percentage: 30% Stand to Sit: 2: Max assist;To bed;To chair/3-in-1 Stand to Sit: Patient Percentage: 30% Details for Transfer Assistance: Assist to block left LE at knee/foot to increase stability as well as at sacrum to increase anterior/superior translate with cues for sequence.  Limited by weakness causing increased flexion at hips and trunk.       Exercises  General Exercises - Upper Extremity Chair Push Up: AAROM;10 reps;Seated   Balance Static Sitting Balance Static Sitting - Balance Support: Bilateral upper extremity supported;Feet supported Static Sitting - Level of Assistance: 3: Mod assist   End of Session OT - End of Session Activity Tolerance: Patient tolerated treatment well Patient left: in chair;with call bell/phone within reach;with chair alarm set Nurse Communication: Mobility status  GO     Tory Emerald  Anne 02/17/2012, 1:38 PM [phone]

## 2012-02-18 ENCOUNTER — Encounter (HOSPITAL_COMMUNITY): Payer: Self-pay | Admitting: *Deleted

## 2012-02-18 ENCOUNTER — Encounter (HOSPITAL_COMMUNITY)
Admission: EM | Disposition: A | Discharge status: Home Health | Payer: Self-pay | Source: Home / Self Care | Attending: Internal Medicine

## 2012-02-18 DIAGNOSIS — R633 Feeding difficulties: Secondary | ICD-10-CM

## 2012-02-18 HISTORY — PX: PEG PLACEMENT: SHX5437

## 2012-02-18 LAB — GLUCOSE, CAPILLARY
Glucose-Capillary: 156 mg/dL — ABNORMAL HIGH (ref 70–99)
Glucose-Capillary: 192 mg/dL — ABNORMAL HIGH (ref 70–99)

## 2012-02-18 SURGERY — INSERTION, PEG TUBE
Anesthesia: Moderate Sedation

## 2012-02-18 MED ORDER — ATORVASTATIN CALCIUM 40 MG PO TABS
40.0000 mg | ORAL_TABLET | Freq: Every day | ORAL | Status: DC
  Administered 2012-02-18: 40 mg
  Filled 2012-02-18 (×2): qty 1

## 2012-02-18 MED ORDER — MIDAZOLAM HCL 10 MG/2ML IJ SOLN
INTRAMUSCULAR | Status: DC | PRN
  Administered 2012-02-18: 2 mg via INTRAVENOUS
  Administered 2012-02-18: 1 mg via INTRAVENOUS

## 2012-02-18 MED ORDER — FLEET ENEMA 7-19 GM/118ML RE ENEM
1.0000 | ENEMA | Freq: Every day | RECTAL | Status: DC | PRN
Start: 2012-02-18 — End: 2012-02-19
  Administered 2012-02-19: 1 via RECTAL
  Filled 2012-02-18 (×4): qty 1

## 2012-02-18 MED ORDER — DEXTROSE 5 % IV SOLN: 1.0000 g | Freq: Two times a day (BID) | INTRAVENOUS | Status: AC

## 2012-02-18 MED ORDER — OLANZAPINE 5 MG PO TABS
5.0000 mg | ORAL_TABLET | Freq: Every day | ORAL | Status: DC
  Administered 2012-02-18: 5 mg
  Filled 2012-02-18 (×2): qty 1

## 2012-02-18 MED ORDER — WARFARIN SODIUM 3 MG PO TABS: ORAL_TABLET | ORAL | Status: DC

## 2012-02-18 MED ORDER — BUTAMBEN-TETRACAINE-BENZOCAINE 2-2-14 % EX AERO
INHALATION_SPRAY | CUTANEOUS | Status: DC | PRN
  Administered 2012-02-18: 2 via TOPICAL

## 2012-02-18 MED ORDER — MIDAZOLAM HCL 5 MG/ML IJ SOLN
INTRAMUSCULAR | Status: AC
  Filled 2012-02-18: qty 2

## 2012-02-18 MED ORDER — OSMOLITE 1.2 CAL PO LIQD
1000.0000 mL | ORAL | Status: DC
  Administered 2012-02-18: 1000 mL
  Filled 2012-02-18 (×4): qty 1000

## 2012-02-18 MED ORDER — METOPROLOL TARTRATE 25 MG PO TABS
25.0000 mg | ORAL_TABLET | Freq: Two times a day (BID) | ORAL | Status: DC
  Filled 2012-02-18: qty 1

## 2012-02-18 MED ORDER — VANCOMYCIN HCL 500 MG IV SOLR: 500.0000 mg | Freq: Two times a day (BID) | INTRAVENOUS | Status: AC

## 2012-02-18 MED ORDER — MAGNESIUM OXIDE 400 (241.3 MG) MG PO TABS
400.0000 mg | ORAL_TABLET | Freq: Every day | ORAL | Status: DC
  Administered 2012-02-19: 400 mg
  Filled 2012-02-18: qty 1

## 2012-02-18 MED ORDER — OSMOLITE 1.2 CAL PO LIQD: 1000.0000 mL | ORAL | Status: DC

## 2012-02-18 MED ORDER — METOPROLOL TARTRATE 25 MG PO TABS
25.0000 mg | ORAL_TABLET | Freq: Two times a day (BID) | ORAL | Status: DC
  Administered 2012-02-18: 25 mg
  Filled 2012-02-18 (×3): qty 1

## 2012-02-18 MED ORDER — FENTANYL CITRATE 0.05 MG/ML IJ SOLN
INTRAMUSCULAR | Status: AC
  Filled 2012-02-18: qty 2

## 2012-02-18 MED ORDER — FENTANYL CITRATE 0.05 MG/ML IJ SOLN
INTRAMUSCULAR | Status: DC | PRN
  Administered 2012-02-18: 25 ug via INTRAVENOUS

## 2012-02-18 MED ORDER — PANTOPRAZOLE SODIUM 40 MG IV SOLR
40.0000 mg | Freq: Every day | INTRAVENOUS | Status: DC
  Administered 2012-02-18: 40 mg via INTRAVENOUS
  Filled 2012-02-18 (×2): qty 40

## 2012-02-18 NOTE — Interval H&P Note (Signed)
History and Physical Interval Note:  02/18/2012 9:05 AM  [name]  has presented today for surgery, with the diagnosis of dysphagia  The various methods of treatment have been discussed with the patient and family. After consideration of risks, benefits and other options for treatment, the patient has consented to  Procedure(s) (LRB) with comments: PERCUTANEOUS ENDOSCOPIC GASTROSTOMY (PEG) PLACEMENT (N/A) as a surgical intervention .  The patient's history has been reviewed, patient examined, no change in status, stable for surgery.  I have reviewed the patient's chart and labs.  Questions were answered to the patient's satisfaction.    The recent H&P (dated *02/17/12**) was reviewed, the patient was examined and there is no change in the patients condition since that H&P was completed.   [name]  02/18/2012, 9:05 AM    [name]

## 2012-02-18 NOTE — Progress Notes (Signed)
PEG tube was placed. Asked nutrition to begin to feedings this evening.  Recommend resume Coumadin in 48 hours

## 2012-02-18 NOTE — Progress Notes (Signed)
Spoke with GI about patient being DC'd after tube feeding started this evening.  Per PA, tube feedings need to be started and demonstrated that patient can tolerate feeds.  [facility] MD notified.  Have paged nutrition to dose feeds.  Will continue to monitor. Nolon Nations

## 2012-02-18 NOTE — Progress Notes (Signed)
PEG tube feeds initiated at 20 ml/hr.  Placement auscultated and 0 ml of gastric residual recorded.  Will increase every 4 hours until at goal rate.  Will continue to monitor. Nolon Nations

## 2012-02-18 NOTE — Progress Notes (Signed)
Pt plans to dc today via non emergency ambulance home with home health services. CSW provided pt RN with ptar form and number to call for transportation. Pt anticipated to dc home this evening.   .No further Clinical Social Work needs, signing off.   Catha Gosselin, Theresia Majors  [phone] .02/18/2012 1403pm

## 2012-02-18 NOTE — Op Note (Signed)
[name] [address] [place], [address]   OPERATIVE PROCEDURE REPORT  PATIENT: [name], [name]  MR#: [id number] BIRTHDATE :[birth year] , 79  yrs. old GENDER: Male ENDOSCOPIST: Louis Meckel, MD PROCEDURE DATE:  02/18/2012 PROCEDURE:   EGD with PEG placement ASA CLASS:   Class III INDICATIONS:1.  placement of PEG. MEDICATIONS: These medications were titrated to patient response per physician's verbal order, Fentanyl 25 mcg IV, and Versed 3 mg IV TOPICAL ANESTHETIC:  DESCRIPTION OF PROCEDURE:  After the risks benefits and alternatives of the procedure were thoroughly explained, informed consent was obtained.  The     endoscope was introduced through the mouth  and advanced to the second portion of the duodenum ,      The instrument was slowly withdrawn as the mucosa was fully examined.  The upper, middle and distal third of the esophagus were carefully inspected and no abnormalities were noted.  The z-line was well seen at the GEJ.  The endoscope was pushed into the fundus which was normal including a retroflexed view.  The antrum, gastric body, first and second part of the duodenum were unremarkable.    The stomach was then inflated with air, and by a combination of transillumination and manual palpation, the site for the gastrostomy tube placement was selected and marked on the anterior abdominal wall.  The skin of the anterior abdomen was surgically prepped and draped with sterile towels.  Utilizing strict sterile technique, the selected site was then anesthetized with 1% xylocaine by injection into the skin and subcutaneous tissue.  A 1 cm incision was made through the skin and subcutaneous tissue, and the needle/cannula assembly was then passed through the abdominal wall and through the anterior wall of the stomach, maintaining visualization with the endoscope.  A snare device previously placed through the instrument channel was  then opened and placed around the cannula, the needle was removed, and the insertion wire was passed through the cannula and into the stomach lumen.  The snare was then loosened from the cannula, and repositioned to snare the insertion wire.  The snare was then pulled up to the endoscope distal tip, and the scope was then withdrawn bringing with it the snare and insertion wire.  The insertion wire was then released from the snare, and then loop-attached to the [employer] Pull PEG   The G-tube insertion site was then cleansed once again, and the external bolster was placed over the tube to secure it to the abdominal wall.  A sterile dressing was then applied, and the procedure terminated.  COMPLICATIONS: There were no complications.   ENDOSCOPIC IMPRESSION: Normal EGD -status post PEG placement  RECOMMENDATIONS:begin tube feedings at 6 PM   REPEAT EXAM:  CC Dr. [name]   _______________________________ eSigned:  Louis Meckel, MD 02/18/2012 9:37 AM    CC:  PATIENT NAME:  Lindbergh, Winkles MR#: [id number]

## 2012-02-18 NOTE — Progress Notes (Signed)
Nutrition Consult/Follow-up  Intervention:    Initiate Osmolite 1.2 formula at 20 ml/hr and increase 10 ml every 4 hours to goal rate of 70 ml/hr; once tolerance established, can run EN regimen nocturnally (7PM--7AM) to provide 1008 kcals, 47 grams protein, 680 ml free water daily (~50% of estimated nutrition needs) RD to follow for nutrition care plan  Assessment:   Patient accidentally pulled Panda tube out 9/8. S/p PEG tube placement today. Spoke with RN, plan is to begin EN regimen at [facility] this evening. PO intake remains minimal at 0-30% per flowsheet records. Continues to receive Ensure Pudding supplement 3 times daily between meals.  Diet Order:  Dysphagia 3, thin liquids  Meds: Scheduled Meds:   . atorvastatin  40 mg Oral QHS  . bisacodyl  5 mg Oral Once  . cefTAZidime (FORTAZ)  IV  1 g Intravenous Q12H  . feeding supplement  1 Container Oral TID BM  . furosemide  40 mg Intravenous Q8H  . insulin aspart  0-9 Units Subcutaneous TID WC & HS  . levalbuterol  0.63 mg Nebulization TID  . magnesium oxide  400 mg Oral Daily  . OLANZapine  5 mg Oral QHS  . olanzapine-FLUoxetine  1 capsule Oral QHS  . pantoprazole  40 mg Oral Q1200  . sodium chloride  3 mL Intravenous Q12H  . vancomycin  500 mg Intravenous Q12H  . warfarin  5 mg Oral ONCE-1800  . Warfarin - Pharmacist Dosing Inpatient   Does not apply q1800   Continuous Infusions:   . feeding supplement (OSMOLITE 1.2 CAL)     PRN Meds:.acetaminophen, acetaminophen, albuterol, alum & mag hydroxide-simeth, guaiFENesin-dextromethorphan, HYDROcodone-acetaminophen, levalbuterol, ondansetron (ZOFRAN) IV, ondansetron, RESOURCE THICKENUP CLEAR, sodium chloride, DISCONTD: butamben-tetracaine-benzocaine, DISCONTD: fentaNYL, DISCONTD: midazolam, DISCONTD: naloxone  Labs:  CMP     Component Value Date/Time   NA 142 02/16/2012 0500   K 3.5 02/16/2012 0500   CL 99 02/16/2012 0500   CO2 36* 02/16/2012 0500   GLUCOSE 159* 02/16/2012 0500   BUN 23  02/16/2012 0500   CREATININE 1.22 02/16/2012 0500   CREATININE 0.96 04/03/2011 0857   CALCIUM 8.7 02/16/2012 0500   PROT 6.5 02/08/2012 0350   ALBUMIN 1.7* 02/08/2012 0350   AST 11 02/08/2012 0350   ALT 7 02/08/2012 0350   ALKPHOS 78 02/08/2012 0350   BILITOT 0.2* 02/08/2012 0350   GFRNONAA 55* 02/16/2012 0500   GFRAA 64* 02/16/2012 0500     Intake/Output Summary (Last 24 hours) at 02/18/12 1604 Last data filed at 02/18/12 1200  Gross per 24 hour  Intake     50 ml  Output   1300 ml  Net  -1250 ml    CBG (last 3)   Basename 02/18/12 1204 02/18/12 0717 02/17/12 2114  GLUCAP 156* 118* 145*    Weight Status: 48.8 kg (9/10) -- fluctuating  Re-estimated needs: 1925-2200 kcals, 85-100 gm protein   Nutrition Dx: Inadequate Oral Intake, ongoing   Goal: Oral intake & EN to meet >/= 90% of estimated nutrition needs, currently unmet  Monitor: EN regimen & tolerance, PO intake, weight, labs, I/O's   Kirkland Hun, RD, LDN  Pager #: [phone]  After-Hours Pager #: [phone]

## 2012-02-18 NOTE — Progress Notes (Signed)
  ANTICOAGULATION CONSULT NOTE - Follow Up Consult  Pharmacy Consult for Coumadin Indication: atrial fibrillation  Allergies  Allergen Reactions  . Levaquin (Levofloxacin) Rash  . Morphine And Related Other (See Comments)    Confusion, agitation  . Codeine Nausea And Vomiting    Noted on MAR  . Penicillins Rash    Noted on [facility]    Patient Measurements: Height: 5\' 9"  (175.3 cm) Weight: 107 lb 11.2 oz (48.852 kg) IBW/kg (Calculated) : 70.7    Vital Signs: Temp: 98.6 F (37 C) (09/10 1330) Temp src: Oral (09/10 0819) BP: 102/69 mmHg (09/10 1330) Pulse Rate: 109  (09/10 1330)  Labs:  Basename 02/18/12 0529 02/17/12 0530 02/16/12 0500  HGB -- -- 9.5*  HCT -- -- 30.3*  PLT -- -- 244  APTT -- -- --  LABPROT 20.0* 17.4* 16.8*  INR 1.67* 1.40 1.34  HEPARINUNFRC -- -- --  CREATININE -- -- 1.22  CKTOTAL -- -- --  CKMB -- -- --  TROPONINI -- -- --    Estimated Creatinine Clearance: 35.1 ml/min (by C-G formula based on Cr of 1.22).   Assessment: 78 yo male on Coumadin PTA (3mg  daily-dose decreased by Coumadin clinic on 02/04/12, see in Chart review) for atrial fibrillation. Coumadin initially was on hold for procedures, then resumed. Pt now s/p PEG tube placement today (9/10). Per Dr. Marzetta Board note in chart, recommend to resume Coumadin in 48 hours. Will hold Coumadin tonight.   INR SUB-therapeutic at 1.67 (incr). CBC has been stable, no CBC last two days. No bleeding noted per chart.  Goal of Therapy:  INR 2-3 Monitor platelets by anticoagulation protocol: Yes   Plan:  - No Coumadin tonight (s/p PEG tube placement).  - F/u INR in AM if still here. If d/c home, recommend INR check in 1 week.  - Coumadin education complete.    Thank you for the consult.  Nicolasa Ducking, PharmD Clinical Pharmacist Pgr [phone] 02/18/2012 1:41 PM

## 2012-02-18 NOTE — Discharge Summary (Addendum)
Physician Discharge Summary  [name]:[id number] DOB: [birth year] DOA: 02/07/2012  PCP: Sean Gasser, MD  Admit date: 02/07/2012 Discharge date: 02/19/2012  Recommendations for Outpatient Follow-up:  1. Patient is being discharged on a dysphagia 3 restricted diet. The build nutrition, he's had a PEG tube placed with plans for tube feedings nightly 2. Patient will continue on IV antibiotics for 2 more days and to the end of 9/12 to complete a total of 10 days' therapy  Discharge Diagnoses:  Principal Problem:  *Acute diastolic congestive heart failure Active Problems:  DIABETES MELLITUS, TYPE II  Atrial fibrillation  Coagulopathy  Anemia  Dehydration  Hypocalcemia  Diarrhea  Acute renal failure  Debility  Hypotension  Bradycardia  Dysphagia  Hypoxia  Esophageal stenosis  Aspiration pneumonia  Feeding difficulties   Discharge Condition: Improved, being discharged home  Diet recommendation: Dysphagia 3 diet with nectar thick liquids. Patient will also be getting tube feedings through PEG tube with Osmolite 1.2 at 70 cc per hour from 7 PM to 7 AM  Filed Weights   02/16/12 0600 02/17/12 0400 02/18/12 0500  Weight: 49.941 kg (110 lb 1.6 oz) 47.3 kg (104 lb 4.4 oz) 48.852 kg (107 lb 11.2 oz)    History of present illness:  Patient is a 78 year old white male past we'll history of dementia with secondary dysphasia who presented on 8/30 with several weeks of diarrhea ever since he had been discharged from a short-term skilled nursing facility. They got to the point where the patient was very weak and unable to walk or transfer-he was already status post right BKA for peripheral vascular disease. In emergency room he presented with acute renal failure and was admitted to the hospital service for further workup.  Hospital Course:  Volume overload: Cause of INR being up. See below. Patient received IV fluids to resuscitate for volume depletion from diarrhea. He can  mildly volume overloaded and his BNP on 9/34 was at 35,000. With IV diuretics it was down to 6000 x 9/6 and then 3700 x 9/7. At this point the patient started being slightly over diuresed and his BNP trended upward but this also corresponds with a mild increase in his BUN and creatinine. At this point is felt to be rather volume equivocal. It is noted that on admission he was 55 kg and by 9/10, he is down to 48.8 kg.  Aspiration PNA: On the evening of 9/3, patients are having significant coughing and shortness of breath with decreased oxygen saturations. He required 6 L of nasal cannula oxygen to keep sats greater than 90% when previously he was 97% on room air. He is also having some tachycardia and spiking temperatures.  The patient has a previous history of dysphasia and was felt that he had a sudden aspiration of that as well some mild fluid overload. Wife confirmed that he was indeed to stay a DO NOT RESUSCITATE. Patient was started on broad-spectrum antibiotics. His allergies to Levaquin and penicillin, so he was put on IV ceftaz and vancomycin. Treat this as an aspiration pneumonia, pharmacy recommend patient stay on IV antibiotics for a full ten-day course. As of 9/10, he is at Day 8/10 IV abx. WBC ct near normal.  Fortunately with dietary restrictions, pneumonia is responding to antibiotics. He'll be discharged home with 2 more days of IV antibiotics. A PICC line was placed on 9/6.  1. Diarrhea - resolved, possibly viral gastroenteritis appears to have run its course , DC IVF, Stool Cx negative & C.difficile  PCR -negative   2. DIABETES MELLITUS, TYPE II - sliding scale   3.. Atrial fibrillation - the patient was first admitted, metoprolol and digoxin on hold given bradycardia, HR better as digoxin gets out of system, held warfarin initially because of aspiration issues. Has since restarted. INR not yet therapeutic. The patient's heart rate started to increase on 9/10, and metoprolol was restarted.  Patient's Coumadin dosing had recently been changed several days prior to his admission and his home dose has now been clarified at 3 mg each bedtime.  4 . Hypotension/ Dehydration - noted on admission, secondary to volume depletion, and diarrhea. Once this improved, stopped IV fluids because of volume overload.  5. Anemia-acute on chronic - anemia panel unremarkable, suspect chronic disease and worsened by acute illness, last EGD/Colonoscopy and capsule endoscopy 2 years ago, unable to find source of blood loss per wife  Transfused 2units PRBC 9/1, FU with hematology as outpatient  No overt blood loss   5. Hypocalcemia - replaced.   6. Acute renal failure -patient presented with a creatinine of 1.42 with a BUN of 41. This is felt to be secondary diarrhea. 3 months earlier, his creatinine was normal. He was given IV fluids and over the next 2 days, his creatinine trended back down to as low as 1.05 on 9/3. This point he looked to be mildly volume overloaded and was started on Lasix. Over the next few days his creatinine has bumped up slightly and has leveled off at 1.23 as of 9/6. Follow be met will be checked on day of discharge.  7. Coagulopathy: secondary to malnutrition/diarrhea/Vit K defi and coumadin, received Vit K on admission,. Main cause of volume overload. Once we diuresed him, his INR dropped. Have since restarted Coumadin. His INR was 1.67 on 9/10  8. Debility - s/p Pt/OT eval, decline SNF although may need.  He will be followed by home health PT and OT  9. Dysphagia with persistent coughing with meals: MBS in 5/23 with esophageal and pharyngeal dysphagia . Achalasia seen with endoscopy this admission on 9/4 and repeat modified barium swallow, we then started the patient on a dysphagia 3 mechanical soft diet with nectar thick liquids. Gastroenterology followed up with the patient and was concerned that with such severe restrictions, the patient would spend all day long eating slowly and  would not receive proper caloric intake. It was felt best in a short-term to place a panda tube which was done on 9/7 and then replaced with a small tube on 9/8, but soon after tube feedings started, patient accidentally dislodged the panda tube. After Some discussion with him and his wife, the patient is now amenable to a PEG tube which which he had previously refused. GI placed the PEG tube without incident on 9/10 and he was started on slow tube feedings with titration up that evening. The plan is to be discharged home on 9/11  10. Asymptomatic bacteruria:  Urine cultures with [facility] but patient completely asymptomatic from this at this time, hence did not treat formally. It was covered anyway by his antibiotics for his pneumonia.   Procedures:  9/10: Placement of PEG tube  9/8: Exchange of the Dobbhoff tube for smaller  9/7: Placement of Dobbhoff tube  9/4: EGD noting pooling of secretions in distal esophagus and smooth GE junction with partial stenosis. Possible achalasia  Consultations:  Rollene Rotunda, , cardiology  Wendall Papa, gastroenterology  Discharge Exam: Filed Vitals:   02/18/12 1013 02/18/12 1015 02/18/12 1057  02/18/12 1330  BP: 91/54 91/54  102/69  Pulse:    109  Temp:    98.6 F (37 C)  TempSrc:      Resp:  40  18  Height:      Weight:      SpO2:  96% 96% 95%    General: Currently somnolent, no acute distress, tolerated placement of PEG tube HEENT: Normocephalic, atraumatic, mucous hemorrhage slightly dry Cardiovascular: Regular rate and rhythm, S1-S2 Respiratory: Decreased breath sounds at the bases Abdomen soft, nontender, nondistended, PEG tube in place, hypoactive bowel sounds Extremities: No clubbing or cyanosis, trace pitting edema  Discharge Instructions  Discharge Orders    Future Appointments: Provider: Department: Dept Phone: Center:   03/16/2012 11:00 AM Rachael Fee, MD [facility] [place] Office [phone] [facility]     Future Orders  Please Complete By Expires   Diet - low sodium heart healthy      DIET DYS 3      Scheduling Instructions:   With nectar thick liquids   Increase activity slowly      Increase activity slowly        Medication List  As of 02/18/2012 10:09 PM   STOP taking these medications         digoxin 0.125 MG tablet      metoprolol succinate 50 MG 24 hr tablet         TAKE these medications         atorvastatin 40 MG tablet   Commonly known as: LIPITOR   Take 40 mg by mouth at bedtime.      dextrose 5 % SOLN 50 mL with cefTAZidime 1 G SOLR 1 g   Inject 1 g into the vein every 12 (twelve) hours.   Start taking on: 02/19/2012      feeding supplement (OSMOLITE 1.2 CAL) Liqd   Place 1,000 mLs into feeding tube continuous. Place 1,000 mLs into feeding tube continuous. Start Osmolite 1.2 at rate of 70 ml/hr from 7pm-7am      Fish Oil 1200 MG Caps   Take 1 capsule by mouth daily.      furosemide 20 MG tablet   Commonly known as: LASIX   Take 1 tablet (20 mg total) by mouth daily.      magnesium oxide 400 (241.3 MG) MG tablet   Commonly known as: MAG-OX   Take 1 tablet (400 mg total) by mouth daily.      metFORMIN 500 MG tablet   Commonly known as: GLUCOPHAGE   Take 500 mg by mouth 2 (two) times daily.      OLANZapine 5 MG tablet   Commonly known as: ZYPREXA   Take 5 mg by mouth at bedtime.      olanzapine-FLUoxetine 6-25 MG per capsule   Commonly known as: SYMBYAX   Take 1 capsule by mouth at bedtime.      omeprazole 20 MG capsule   Commonly known as: PRILOSEC   Take 20 mg by mouth daily.      sodium chloride 0.9 % SOLN 100 mL with vancomycin 500 MG SOLR 500 mg   Inject 500 mg into the vein every 12 (twelve) hours.   Start taking on: 02/19/2012         warfarin 3 MG tablet   Commonly known as: COUMADIN   Take 3 mg by mouth daily.            Follow-up Information    Follow up with METHENEY,CATHERINE, MD  in 1 week.   Contact information:   [address]    [address]  [place] [address] [phone]       Follow up with Tonny Bollman, MD in 2 weeks.   Contact information:   [address] [place] [address] [phone]       Call INR check . (per [facility] RN)       Follow up with Rob Bunting, MD on 03/16/2012. (11 AM to follow up swallowing troubles. )    Contact information:   [address] [place] [address] [phone]           The results of significant diagnostics from this hospitalization (including imaging, microbiology, ancillary and laboratory) are listed below for reference.    Significant Diagnostic Studies: X-ray Chest Pa And Lateral   02/08/2012  IMPRESSION: Constellation of findings compatible with mild CHF with interstitial edema.   Original Report Authenticated By: Andreas Newport, M.D.    Dg Esophagus  02/11/2012  IMPRESSION: Very limited study demonstrating marked stasis of barium in the valleculae or piriform sinuses.  Speech therapy consultation is recommended to evaluate for potential aspiration.   Original Report Authenticated By: Bernadene Bell. D'ALESSIO, M.D.    Dg Chest Port 1 View  02/14/2012    IMPRESSION: Tip of right arm PICC line projects over SVC. Bibasilar atelectasis versus infiltrate. Improved aeration since 02/11/2012.   Original Report Authenticated By: Lollie Marrow, M.D.    Dg Chest Port 1 View  02/11/2012 IMPRESSION: 1.  The appearance of the chest suggests mild congestive heart failure, as above. 2.  In addition, there is more focal airspace consolidation in the right mid and lower lung, and at the left base, concerning for superimposed infection.  Alternatively, this pattern could be seen in the setting of recent aspiration.  Clinical correlation is recommended. 3.  Atherosclerosis. 4.  Status post CABG.   Original Report Authenticated By: Florencia Reasons, M.D.    Dg Naso G Tube Plc W/fl W/rad  02/15/2012  Impression:  Successful fluoroscopic guided placement of NG tube with tip terminating within the gastric antrum/duodenal bulb.   Original Report Authenticated By: Waynard Reeds, M.D.    Dg Intro Long Gi Tube  02/16/2012 IMPRESSION:  Successful exchange of 14 French nasogastric tube for a 10-French nasogastric tube.  The tip of the tube is in the peripyloric gastric antrum.   Original Report Authenticated By: Alvino Blood Swallowing Func-no Report  02/13/2012  CLINICAL DATA: dysphagia   FLUOROSCOPY FOR SWALLOWING FUNCTION STUDY:  Fluoroscopy was provided for swallowing function study, which was  administered by a speech pathologist.  Final results and recommendations  from this study are contained within the speech pathology report.      Microbiology: Recent Results (from the past 240 hour(s))  CLOSTRIDIUM DIFFICILE BY PCR     Status: Normal   Collection Time   02/09/12  2:58 PM      Component Value Range Status Comment   C difficile by pcr NEGATIVE  NEGATIVE Final   STOOL CULTURE     Status: Normal   Collection Time   02/09/12  2:58 PM      Component Value Range Status Comment   Specimen Description STOOL   Final    Special Requests NONE   Final    Culture     Final    Value: NO SALMONELLA,  SHIGELLA, CAMPYLOBACTER, YERSINIA, OR E.COLI 0157:H7 ISOLATED   Report Status 02/13/2012 FINAL   Final     Labs: Basic Metabolic Panel:  Lab 02/16/12 4540 02/14/12 0520 02/13/12 0520 02/12/12 0520  NA 142 139 141 138  K 3.5 3.6 4.0 4.6  CL 99 99 105 107  CO2 36* 31 28 22   GLUCOSE 159* 105* 97 114*  BUN 23 19 19 19   CREATININE 1.22 1.23 1.30 1.26  CALCIUM 8.7 8.4 8.1* 7.5*  MG -- -- -- --  PHOS -- -- -- --   CBC:  Lab 02/16/12 0500 02/14/12 0520 02/13/12 0520 02/12/12 0520  WBC 10.6* 11.8* 11.4* 16.5*  NEUTROABS -- -- -- --  HGB 9.5* 9.4* 8.9* 9.3*  HCT 30.3* 29.1* 27.5* 28.2*  MCV 88.9 86.6 86.2 86.2  PLT 244 271 259 255   BNP: BNP (last 3 results)  Basename 02/18/12 0529 02/16/12  0500 02/15/12 1147  PROBNP 6565.0* 5004.0* 3751.0*   CBG:  Lab 02/18/12 2116 02/18/12 1639 02/18/12 1204 02/18/12 0717 02/17/12 2114  GLUCAP 188* 192* 156* 118* 145*    Time coordinating discharge: 45 minutes  Signed:  Hollice Espy  [facility] 02/18/2012, 10:09 PM

## 2012-02-19 ENCOUNTER — Encounter (HOSPITAL_COMMUNITY): Payer: Self-pay | Admitting: Gastroenterology

## 2012-02-19 ENCOUNTER — Encounter (HOSPITAL_COMMUNITY): Payer: Self-pay

## 2012-02-19 ENCOUNTER — Telehealth: Payer: Self-pay | Admitting: *Deleted

## 2012-02-19 LAB — BASIC METABOLIC PANEL
BUN: 32 mg/dL — ABNORMAL HIGH (ref 6–23)
CO2: 37 mEq/L — ABNORMAL HIGH (ref 19–32)
Calcium: 8.4 mg/dL (ref 8.4–10.5)
Creatinine, Ser: 1.37 mg/dL — ABNORMAL HIGH (ref 0.50–1.35)
GFR calc non Af Amer: 48 mL/min — ABNORMAL LOW (ref >90)
Glucose, Bld: 184 mg/dL — ABNORMAL HIGH (ref 70–99)
Sodium: 141 mEq/L (ref 135–145)

## 2012-02-19 LAB — PROTIME-INR
INR: 2.11 — ABNORMAL HIGH (ref 0.00–1.49)
Prothrombin Time: 24 seconds — ABNORMAL HIGH (ref 11.6–15.2)

## 2012-02-19 NOTE — Care Management Note (Signed)
Case Manager called Rx: for fortaz and vancomycin to [facility] infusion and they will deliver medication in am. [facility] will infuse meds and assist with tube feeding in am. Per Md was ok to hold tube feeds until am. No further needs from CM at this time. Gala Lewandowsky, RN, BSN [phone]

## 2012-02-19 NOTE — Telephone Encounter (Signed)
She states pt is on his way home from hospital and he does have G tube and IV abx. States she is going out to see him know and that they will be sending for orders for PT, OT, HHRN, HH Aide and SW. States if she has any changes or concerns she will call back in am.

## 2012-02-19 NOTE — Progress Notes (Signed)
Speech Language Pathology Dysphagia Treatment Patient Details Name: [name] MRN: [id number] DOB: April 11, 1932 Today's Date: 02/19/2012 Time: 7829-5621 SLP Time Calculation (min): 25 min  Assessment / Plan / Recommendation Clinical Impression  Education complete with patient and wife prior to d/c regarding current po restrictions, process of thickening liquids, and importance of full compliance with compensatory strategies and aspiration precautions. Patient able to verbalize understanding of compensatory strategies with min questioning cues however required mod assist to utilize during intake. Additionally, provided patient with verbal and written information regarding the water protocol which would allow patient to have the option of plain water via mouth, in between meals, and after thorough oral care with lowest aspiration risk. Patient and wife verbalized understanding of instructions provided and have plans to d/c home today. SLP will continue to f/u while inpatient. Otherwise, recommend OP SLP f/u for pharyngeal strengthening and repeat MBS as OP when demonstrating improvement in swallowing function clinically.     Diet Recommendation  Continue with Current Diet: Dysphagia 3 (mechanical soft);Nectar-thick liquid;Other (comment) (water protocol )    SLP Plan Continue with current plan of care   Pertinent Vitals/Pain n/a   Swallowing Goals  SLP Swallowing Goals Patient will consume recommended diet without observed clinical signs of aspiration with: Minimal assistance Swallow Study Goal #1 - Progress: Progressing toward goal Patient will utilize recommended strategies during swallow to increase swallowing safety with: Minimal assistance Swallow Study Goal #2 - Progress: Progressing toward goal  General Temperature Spikes Noted: No Respiratory Status: Room air Behavior/Cognition: Alert;Cooperative Oral Cavity - Dentition: Dentures, top;Dentures, bottom Patient Positioning:  Upright in bed      Dysphagia Treatment Treatment focused on: Utilization of compensatory strategies;Patient/family/caregiver Dealer Educated: spouse, RN Treatment Methods/Modalities: Skilled observation;Differential diagnosis Patient observed directly with PO's: Yes Type of PO's observed: Nectar-thick liquids Feeding: Able to feed self Liquids provided via: Teaspoon Pharyngeal Phase Signs & Symptoms: Wet vocal quality Type of cueing: Verbal Amount of cueing: Moderate   GO   Ferdinand Lango MA, CCC-SLP [phone]   [name] 02/19/2012, 1:34 PM

## 2012-02-19 NOTE — Progress Notes (Signed)
Tube feedings increased to goal rate of 70 ml/hr and patient tolerating feedings well.  Discharge medication and instructions reviewed with patient and his wife.  Patient stable with no S/S of distress.  [facility] called for transportation home. [name]

## 2012-02-19 NOTE — Progress Notes (Signed)
  ANTICOAGULATION CONSULT NOTE - Follow Up Consult  Pharmacy Consult for Coumadin, vancomycin, ceftazidime Indication: atrial fibrillation, pneumonia  Allergies  Allergen Reactions  . Levaquin (Levofloxacin) Rash  . Morphine And Related Other (See Comments)    Confusion, agitation  . Codeine Nausea And Vomiting    Noted on MAR  . Penicillins Rash    Noted on [facility]    Patient Measurements: Height: 5\' 9"  (175.3 cm) Weight: 112 lb 3.2 oz (50.894 kg) IBW/kg (Calculated) : 70.7    Vital Signs: Temp: 98.9 F (37.2 C) (09/11 1356) Temp src: Oral (09/11 1356) BP: 92/50 mmHg (09/11 1356) Pulse Rate: 76  (09/11 1356)  Labs:  Basename 02/19/12 0601 02/18/12 0529 02/17/12 0530  HGB -- -- --  HCT -- -- --  PLT -- -- --  APTT -- -- --  LABPROT 24.0* 20.0* 17.4*  INR 2.11* 1.67* 1.40  HEPARINUNFRC -- -- --  CREATININE 1.37* -- --  CKTOTAL -- -- --  CKMB -- -- --  TROPONINI -- -- --    Estimated Creatinine Clearance: 32.5 ml/min (by C-G formula based on Cr of 1.37).   Assessment: 78 yo male on Coumadin PTA (3mg  daily-dose decreased by Coumadin clinic on 02/04/12, see in Chart review) for atrial fibrillation. Coumadin initially was on hold for procedures, then resumed. Pt now s/p PEG tube placement today (9/10). Per Dr. Marzetta Board note in chart, recommend to resume Coumadin in 48 hours. Will hold Coumadin tonight (2nd night). INR therapeutic at 2.11 (incr).  No bleeding noted per chart. Tolerating diet.   Pt currently on D# 9/10 IV vancomycin and ceftazidime for urosepsis verses aspiration pneumonia. WBC has been trending down, pt is afebrile. SCr trending up at 1.37 with est CrCl 33 mL/min. Vancomycin trough drawn 9/5 was in therapeutic range at 18.9 (goal 15-20). Vancomycin and ceftazidime doses are appropriate for the patient's renal function and indication.  9/3 bcx x2>>NG FINAL 8/31 ucx>> EColi FINAL 9/1 cdif PCR >> neg FINAL    Goal of Therapy:  INR 2-3 Monitor platelets  by anticoagulation protocol: Yes   Plan:  - No Coumadin tonight (s/p PEG tube placement).  - F/u INR in AM if still here. If d/c home, recommend INR check in 3-4 days.  - Coumadin education complete.  - Continue vancomycin 500 mg IV q12 hrs and ceftazidime 1g IV q12h - ok to give 1800 doses a little early if pt is discharged today - F/u clinical course, fever curve - F/u d/c plans   Thank you for the consult.  Nicolasa Ducking, PharmD Clinical Pharmacist Pgr [phone] 02/19/2012 4:20 PM

## 2012-02-19 NOTE — Plan of Care (Signed)
Problem: Discharge Progression Outcomes Goal: Tolerating diet Outcome: Completed/Met Date Met:  02/19/12 Pt tolerating increased PEG tube feeds

## 2012-02-19 NOTE — Progress Notes (Signed)
Physical Therapy Treatment Patient Details Name: [name] MRN: [id number] DOB: 1931/07/07 Today's Date: 02/19/2012 Time: 4098-1191 PT Time Calculation (min): 25 min  PT Assessment / Plan / Recommendation Comments on Treatment Session  Pt admitted s/p SOB with increased INR.  Pt would greatly benefit from d/c to SNF prior to d/c home.  However, pt declining SNF.  Will need 24 hour assistance due to severe weakness with lift for OOB.    Follow Up Recommendations  Home health PT;Supervision/Assistance - 24 hour (Truly recommend SNF, but pt declining.)    Barriers to Discharge        Equipment Recommendations  Other (comment) Michiel Sites lift for OOB.)    Recommendations for Other Services    Frequency Min 3X/week   Plan Discharge plan needs to be updated;Frequency remains appropriate    Precautions / Restrictions Precautions Precautions: Fall Restrictions Weight Bearing Restrictions: No   Pertinent Vitals/Pain None    Mobility  Bed Mobility Bed Mobility: Supine to Sit;Sit to Supine (5 trials into and from long sit.) Supine to Sit: 3: Mod assist;HOB flat Sit to Supine: 3: Mod assist;HOB flat Details for Bed Mobility Assistance: Assist to translate trunk anterior with facilitation to trunk/abdominals.  Pt using bilateral UEs to pull trunk forward.  Cues for sequence and to increase participation.   Transfers Transfers: Not assessed Ambulation/Gait Ambulation/Gait Assistance: Not tested (comment) Stairs: No Wheelchair Mobility Wheelchair Mobility: No    Exercises General Exercises - Lower Extremity Ankle Circles/Pumps: AROM;Left;10 reps;Supine Quad Sets: AROM;Both;10 reps;Supine Short Arc Quad: AROM;Both;10 reps;Supine Heel Slides: AROM;Both;10 reps;Supine Hip ABduction/ADduction: AAROM;Both;10 reps;Supine Straight Leg Raises: AAROM;Both;10 reps;Supine   PT Diagnosis:    PT Problem List:   PT Treatment Interventions:     PT Goals Acute Rehab PT Goals PT Goal  Formulation: With patient Time For Goal Achievement: 02/24/12 Potential to Achieve Goals: Fair PT Goal: Supine/Side to Sit - Progress: Progressing toward goal PT Goal: Sit to Supine/Side - Progress: Progressing toward goal  Visit Information  Last PT Received On: 02/19/12 Assistance Needed: +2    Subjective Data  Subjective: "I am hoping that I actually get to go home today." Patient Stated Goal: None stated.     Cognition  Overall Cognitive Status: Impaired Area of Impairment: Memory Arousal/Alertness: Awake/alert Orientation Level: Disoriented to;Time Behavior During Session: WFL for tasks performed    Balance  Balance Balance Assessed: No  End of Session PT - End of Session Activity Tolerance: Patient limited by fatigue Patient left: in bed;with call bell/phone within reach;with bed alarm set Nurse Communication: Mobility status   GP     Cephus Shelling 02/19/2012, 8:16 AM  02/19/2012 Cephus Shelling, PT, DPT [phone]

## 2012-02-21 ENCOUNTER — Telehealth: Payer: Self-pay | Admitting: *Deleted

## 2012-02-21 MED ORDER — COLLAGENASE 250 UNIT/GM EX OINT: TOPICAL_OINTMENT | Freq: Every day | CUTANEOUS | Status: AC

## 2012-02-21 NOTE — Telephone Encounter (Signed)
Orders given per Dr. Linford Arnold to [facility] with Frances Furbish [phone]) for ST, air mattress and PICC line removal after abx completed. Santyl ointment sent to pharmacy for pt's decub on his bottom.

## 2012-02-21 NOTE — Telephone Encounter (Signed)
Alvino Chapel from [facility] called and wanted to know when mr. Hopes's next inr should be

## 2012-02-21 NOTE — Telephone Encounter (Signed)
Ellen informed 

## 2012-02-21 NOTE — Telephone Encounter (Signed)
Check INR on Monday.

## 2012-02-24 ENCOUNTER — Ambulatory Visit: Payer: Self-pay | Admitting: Family Medicine

## 2012-02-24 ENCOUNTER — Telehealth: Payer: Self-pay | Admitting: *Deleted

## 2012-02-24 DIAGNOSIS — I4891 Unspecified atrial fibrillation: Secondary | ICD-10-CM

## 2012-02-24 LAB — PROTIME-INR
INR: 1.6 — AB (ref 0.9–1.1)
INR: 1.6 — AB (ref 0.9–1.1)

## 2012-02-24 NOTE — Telephone Encounter (Signed)
[facility] nurse called [facility] and stated " PT 16.0 INR 1.6 Coumadin 3 mg daily

## 2012-02-24 NOTE — Progress Notes (Signed)
Informed wife of new dosage and LM for Alvino Chapel [facility]) with new dose and next INR check.

## 2012-02-24 NOTE — Telephone Encounter (Signed)
[employer] flowsheet updated.  Pt will be contacted with new instructions.

## 2012-02-25 ENCOUNTER — Ambulatory Visit: Payer: Self-pay | Admitting: Family Medicine

## 2012-02-25 ENCOUNTER — Telehealth: Payer: Self-pay | Admitting: *Deleted

## 2012-02-25 DIAGNOSIS — I4891 Unspecified atrial fibrillation: Secondary | ICD-10-CM

## 2012-02-25 NOTE — Telephone Encounter (Signed)
Gave VO for OT to work with pt on UE strengthing. They requested 2 x a week for 2 weeks and states that after that time pt will be up for recert for [facility] and she will call back at a later time for further orders.

## 2012-02-25 NOTE — Telephone Encounter (Signed)
Ok for verbal order to approve OT.

## 2012-02-26 ENCOUNTER — Telehealth: Payer: Self-pay | Admitting: *Deleted

## 2012-02-26 NOTE — Telephone Encounter (Signed)
[facility] nurse Alvino Chapel at [phone] called and asking if wife can give patient. Juice and liquids through his peg through the daytime.

## 2012-02-26 NOTE — Telephone Encounter (Signed)
Called to let you know that pt slipped out of his chair and was helped to the floor by his wife. States pt has no injuries and that they are starting PT 2x a week for 4 weeks.  FYI

## 2012-02-27 NOTE — Telephone Encounter (Signed)
Is this instead of thickening them and taking them through the mouth?

## 2012-02-28 NOTE — Telephone Encounter (Signed)
Talked with [name] and ok per Dr. Linford Arnold to give liquids through peg tube

## 2012-02-28 NOTE — Telephone Encounter (Signed)
Left message for [facility] nurse to call back to verify orders

## 2012-03-03 ENCOUNTER — Ambulatory Visit: Payer: Self-pay | Admitting: Family Medicine

## 2012-03-03 ENCOUNTER — Telehealth: Payer: Self-pay | Admitting: *Deleted

## 2012-03-03 DIAGNOSIS — I4891 Unspecified atrial fibrillation: Secondary | ICD-10-CM

## 2012-03-03 LAB — PROTIME-INR
INR: 10.6 — AB (ref 0.9–1.1)
INR: 1.1 (ref 0.9–1.1)

## 2012-03-03 NOTE — Telephone Encounter (Signed)
See changes on flowsheet.

## 2012-03-03 NOTE — Telephone Encounter (Signed)
Nurse Elenora Gamma called back stating her INR machine is not working properly. She asks that you disregard reading from today. She will recheck w/ new machine in the AM and call w/ results.

## 2012-03-03 NOTE — Telephone Encounter (Signed)
Correction on anticoag sheet. See new instructions.

## 2012-03-03 NOTE — Telephone Encounter (Signed)
Alvino Chapel nurse with [facility] called with INR results.  PT= 1.1  INR=10.6  Pt currently taking Coumadin 3mg  tabs 1 1/2 tabs on Monday and Thursday and 3mg  all other days.

## 2012-03-03 NOTE — Telephone Encounter (Signed)
Called Alvino Chapel back to instruct on new dose. She apologizes but states the inr is 1.1 and pt is 10.6- she said it backwards. Please advise

## 2012-03-04 LAB — PROTIME-INR

## 2012-03-05 ENCOUNTER — Telehealth: Payer: Self-pay | Admitting: *Deleted

## 2012-03-05 ENCOUNTER — Ambulatory Visit: Payer: Self-pay | Admitting: Family Medicine

## 2012-03-05 DIAGNOSIS — I4891 Unspecified atrial fibrillation: Secondary | ICD-10-CM

## 2012-03-05 NOTE — Telephone Encounter (Signed)
Please see anticoagulation flowsheet.

## 2012-03-05 NOTE — Telephone Encounter (Signed)
[name] w/[facility] states pt's PT is 18.8 and INR is 1.7. Pt's wife states he is taking 4.5mg  on Tuesday and Thursday and 3mg  the rest of the week.

## 2012-03-05 NOTE — Progress Notes (Signed)
LMOM for Alvino Chapel with [facility] to find out if she rechecked pt's INR on the 25th.

## 2012-03-06 NOTE — Progress Notes (Signed)
LMOM for Ellen-[facility] with new dose and to recheck INR in 1 week. Also informed wife of same information

## 2012-03-11 ENCOUNTER — Telehealth: Payer: Self-pay | Admitting: *Deleted

## 2012-03-11 NOTE — Telephone Encounter (Signed)
I gave a VO for pt to receive PT 2 times a week for 4 weeks.

## 2012-03-12 ENCOUNTER — Telehealth: Payer: Self-pay | Admitting: *Deleted

## 2012-03-12 NOTE — Telephone Encounter (Signed)
Pt's PT is 18.3 and INR is 1.8. HHRN also states that [facility] states they need an order from [place] for his air mattress. She states they are having trouble getting it for him. 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 States she is using the  Santyl ointment. Please advise.

## 2012-03-12 NOTE — Telephone Encounter (Signed)
Can he get here for an appt?  Is a wound nurse looking at it?  We may need ot send him to wound cente.r let's call them at home care and see what the holdup is on the air mattress. We wrote the prescription sometime ago so not sure why he is unable to get it.

## 2012-03-13 ENCOUNTER — Ambulatory Visit: Payer: Self-pay | Admitting: Family Medicine

## 2012-03-13 ENCOUNTER — Encounter: Payer: Self-pay | Admitting: Family Medicine

## 2012-03-13 DIAGNOSIS — I4891 Unspecified atrial fibrillation: Secondary | ICD-10-CM

## 2012-03-13 LAB — PROTIME-INR: INR: 1.8 — AB (ref 0.9–1.1)

## 2012-03-13 NOTE — Progress Notes (Signed)
Wife informed

## 2012-03-13 NOTE — Telephone Encounter (Signed)
[name] with [facility] states they don't have wound care nurses but she thinks he would benefit more from going to a wound care center. I have faxed the letter to [facility]. Also are we making any changes on his coumadin and when would you like the next INR check?

## 2012-03-13 NOTE — Telephone Encounter (Signed)
I have received nothing from wound care, so we need to have a wound nurse in the home.  I printed letter and order for the air matress.

## 2012-03-13 NOTE — Telephone Encounter (Signed)
Wife states she will discuss the wound care center with you on MOnday when he comes for his appt. States it will depend on if she can get him here.

## 2012-03-13 NOTE — Telephone Encounter (Signed)
LM for [facility] to return call. I called [facility] and asked about mattress. They are stating they need an order for either a gel overlay mattress or alternating pressure pad mattress. She states they need office notes stating where his decub is, poor nutrition intake with inability to turn on his own or his Medicare will not cover. I informed Dois Davenport at [facility] that he has [facility] and that he has been seen by surgery for his amputation. Would you like to just write a letter stating all this information with an order for one of the mattress? She states we can fax this to [phone].

## 2012-03-13 NOTE — Telephone Encounter (Signed)
Ok let refer to wound care center.  Would [place] be able to take him.

## 2012-03-16 ENCOUNTER — Telehealth: Payer: Self-pay | Admitting: Family Medicine

## 2012-03-16 ENCOUNTER — Ambulatory Visit: Payer: Medicare Other | Admitting: Gastroenterology

## 2012-03-16 ENCOUNTER — Ambulatory Visit: Payer: Medicare Other | Admitting: Family Medicine

## 2012-03-16 DIAGNOSIS — L89159 Pressure ulcer of sacral region, unspecified stage: Secondary | ICD-10-CM

## 2012-03-16 MED ORDER — ATORVASTATIN CALCIUM 40 MG PO TABS: 40.0000 mg | ORAL_TABLET | Freq: Every day | ORAL | Status: DC

## 2012-03-16 MED ORDER — FLUOXETINE HCL 40 MG PO CAPS: 40.0000 mg | ORAL_CAPSULE | Freq: Every day | ORAL | Status: DC

## 2012-03-16 MED ORDER — METFORMIN HCL 500 MG PO TABS: 500.0000 mg | ORAL_TABLET | Freq: Two times a day (BID) | ORAL | Status: DC

## 2012-03-16 NOTE — Telephone Encounter (Signed)
Discussed options for wound care.  Discussed option to change Homecare to get a wound care nurse.  We also discussed upping the Symbyax. I do refer questor Symbyax and for Zyprexa. We will stop that and start fluoxetine, but increased to 40 mg. Prescription sent to [facility].

## 2012-03-17 ENCOUNTER — Telehealth: Payer: Self-pay | Admitting: *Deleted

## 2012-03-17 NOTE — Telephone Encounter (Signed)
Nurse with [facility] calls to inform MD that pt wound is worse- unstageable, smells. Pt wife unable to pt to wound care center due to encapable of lifting pt. Gave nurse the number to [facility] non-emergent so that they could transport to wound center. Verbal order given to call wound center for appt. In [employer].. Dr. Linford Arnold notified of this and agrees. Spoke with Marylene Land with [facility] and order for mattress has been place and should be in today and will call pt home to set up delivery hopefully today as well.[facility] contacted [facility] to purchase the DME equipment

## 2012-03-17 NOTE — Telephone Encounter (Signed)
Occupational therapist calls wanting orders to continue OT for strengthening and self care for every 2 weeks for 4 weeks. Verbal order given

## 2012-03-20 ENCOUNTER — Ambulatory Visit: Payer: Self-pay | Admitting: Family Medicine

## 2012-03-20 ENCOUNTER — Telehealth: Payer: Self-pay | Admitting: *Deleted

## 2012-03-20 DIAGNOSIS — I4891 Unspecified atrial fibrillation: Secondary | ICD-10-CM

## 2012-03-20 LAB — PROTIME-INR: INR: 4.3 — AB (ref 0.9–1.1)

## 2012-03-20 NOTE — Progress Notes (Signed)
  Subjective:    Patient ID: [name], male    DOB: 1931/10/13, 78 y.o.   MRN: [id number]  HPI    Review of Systems     Objective:   Physical Exam        Assessment & Plan:  Alvino Chapel nurse w/ [facility] notified of new Coumadin instructions and pt wife notified a swell as nurse still in the home at visit. KG LPN

## 2012-03-20 NOTE — Telephone Encounter (Signed)
See anticoag flowsheet

## 2012-03-20 NOTE — Telephone Encounter (Signed)
Nurse and pt notiifed of new coumadin instructions. KG LPN

## 2012-03-20 NOTE — Telephone Encounter (Signed)
Alvino Chapel nurse with [facility] calls with critical INR.  INR= 4.3  PT= 43.2. Pt currently taking Coumadin 4.5mg  everyday except for Monday and Thursday takes 3mg 

## 2012-03-30 ENCOUNTER — Ambulatory Visit: Payer: Self-pay | Admitting: Family Medicine

## 2012-03-30 ENCOUNTER — Telehealth: Payer: Self-pay | Admitting: *Deleted

## 2012-03-30 DIAGNOSIS — I4891 Unspecified atrial fibrillation: Secondary | ICD-10-CM

## 2012-03-30 NOTE — Telephone Encounter (Signed)
Ok for wound vac order. Can give verbal.

## 2012-03-30 NOTE — Telephone Encounter (Signed)
Home health agency called and c/o patient needs wound vac order  And PT/INR will be done around 4:30today per Ellen([facility] Nurse)-[phone] Patient discharge from hospital last Thursday

## 2012-03-31 ENCOUNTER — Telehealth: Payer: Self-pay | Admitting: *Deleted

## 2012-03-31 NOTE — Telephone Encounter (Signed)
Home [facility]  PT [facility] Brass) called and states they will be continuing with Physical therapy 2 x a week for 6 weeks for strengthing, transferring and sitting

## 2012-04-06 ENCOUNTER — Telehealth: Payer: Self-pay | Admitting: *Deleted

## 2012-04-06 NOTE — Telephone Encounter (Signed)
Notified Alvino Chapel nurse with [facility] of new instructions. SHe will call patient to notifify him.

## 2012-04-06 NOTE — Telephone Encounter (Signed)
Increase to 3mg  of Monday, Wednesday, Friday. 1/2 tab for the rest of days. Recheck in 1 week.

## 2012-04-06 NOTE — Telephone Encounter (Signed)
Alvino Chapel nurse with [name] calls in with inr results.  INR=1.5    PT= 15.2  Pt currently taking Coumadin 3mg  tabs on Monday and Friday and 1/2 tab (1.5mg ) all other days

## 2012-04-08 ENCOUNTER — Telehealth: Payer: Self-pay | Admitting: *Deleted

## 2012-04-08 NOTE — Telephone Encounter (Signed)
Alvino Chapel nurse with [facility] calls and pt has wound vac in place. Wife had to take it off and placed tape over the area and he has some sores around area tape was. Nurse put some hydrcolloid dressing over the areas until healed. Just FYI

## 2012-04-09 ENCOUNTER — Telehealth: Payer: Self-pay | Admitting: *Deleted

## 2012-04-09 NOTE — Telephone Encounter (Signed)
Nurse with [facility] calls and states that she had to take the wound vac off the pt buttocks due to complaints it was burning the buttocks. Will use wet to dry dressing on area and is going to talk with wife about taking him to wound center possibly today to have them check area out. FYI

## 2012-04-10 ENCOUNTER — Telehealth: Payer: Self-pay | Admitting: *Deleted

## 2012-04-10 MED ORDER — MAGNESIUM OXIDE 400 (241.3 MG) MG PO TABS: 400.0000 mg | ORAL_TABLET | Freq: Every day | ORAL | Status: AC

## 2012-04-10 MED ORDER — FLUOXETINE HCL 40 MG PO CAPS: 40.0000 mg | ORAL_CAPSULE | Freq: Every day | ORAL | Status: DC

## 2012-04-10 MED ORDER — METOPROLOL TARTRATE 25 MG PO TABS: 25.0000 mg | ORAL_TABLET | Freq: Two times a day (BID) | ORAL | Status: DC

## 2012-04-10 MED ORDER — HYDROCODONE-ACETAMINOPHEN 5-325 MG PO TABS: 1.0000 | ORAL_TABLET | ORAL | Status: DC | PRN

## 2012-04-10 MED ORDER — FUROSEMIDE 20 MG PO TABS: 20.0000 mg | ORAL_TABLET | Freq: Every day | ORAL | Status: DC

## 2012-04-10 MED ORDER — ALPRAZOLAM 0.5 MG PO TABS: 0.5000 mg | ORAL_TABLET | Freq: Every evening | ORAL | Status: DC | PRN

## 2012-04-10 NOTE — Telephone Encounter (Signed)
Med entered and reordered

## 2012-04-10 NOTE — Telephone Encounter (Signed)
Wife walked in wanting refills on Alprazolam 0.5mg  Take 1 tab po qhs, vicodin 5-325 Take 1 tab po every 4 hours prn and metoprolol 50mg  take 2 tabs po bid all of which we do not have on his medication list. Wife also states that you had Pt on fluoxetine 40mg  but hosp was giving Pt olanzapine 5mg  tab qhs & olanzapine/fluoxetine 6/25  Take 1 caps qhs. Pt has none of these at home so wife would like to know if you will refill what you want Pt taking. Pt was back in hosp last week but is home now. Wife is waiting in lobby.

## 2012-04-10 NOTE — Telephone Encounter (Signed)
Don't fill olanzapine.  Just give prozac.  Ok for pain meds and xanax.

## 2012-04-10 NOTE — Telephone Encounter (Signed)
Error

## 2012-04-13 ENCOUNTER — Ambulatory Visit: Payer: Self-pay | Admitting: Family Medicine

## 2012-04-13 DIAGNOSIS — I4891 Unspecified atrial fibrillation: Secondary | ICD-10-CM

## 2012-04-13 LAB — POCT INR: INR: 2

## 2012-04-14 ENCOUNTER — Telehealth: Payer: Self-pay | Admitting: *Deleted

## 2012-04-14 MED ORDER — FLUOXETINE HCL 60 MG PO TABS: 60.0000 mg | ORAL_TABLET | Freq: Every day | ORAL | Status: DC

## 2012-04-14 NOTE — Telephone Encounter (Signed)
Alvino Chapel and wife informed.

## 2012-04-14 NOTE — Telephone Encounter (Signed)
Alvino Chapel with [facility] has called stating that pt is having a lot of depression. He is on Prozac 40mg  but she states she doesn't feel it is working for him. Please advise.

## 2012-04-14 NOTE — Telephone Encounter (Signed)
Will increase to 60 mg. If not noticing some improvement over the next 2-3 weeks and please let me know and we can add back and make stabilizer which he was previously taking.

## 2012-04-15 ENCOUNTER — Telehealth: Payer: Self-pay | Admitting: *Deleted

## 2012-04-15 NOTE — Telephone Encounter (Signed)
Home Health Nurse, wife and daughter-in-law called stating Pt has temp of 99.2 Axil. BP is 115/72 pulse 108. Pt confused today and now starting to hallucinate. I suggested ED but family in unable to get Pt there. After discussing w/ Dr. Dorothe Pea I called family back to advice EMS transport to ED of his choice. Family agreed. I called EMS for transport to [facility].

## 2012-04-21 ENCOUNTER — Telehealth: Payer: Self-pay | Admitting: *Deleted

## 2012-04-21 ENCOUNTER — Ambulatory Visit: Payer: Self-pay | Admitting: Family Medicine

## 2012-04-21 DIAGNOSIS — I4891 Unspecified atrial fibrillation: Secondary | ICD-10-CM

## 2012-04-21 NOTE — Telephone Encounter (Signed)
Spoke with Alvino Chapel and given new instructions. She will notifiy pt

## 2012-04-21 NOTE — Progress Notes (Signed)
  Subjective:    Patient ID: [name], male    DOB: 21-Apr-1932, 78 y.o.   MRN: [id number]  HPI    Review of Systems     Objective:   Physical Exam        Assessment & Plan:  Notified Alvino Chapel nurse with [facility] of new instructions.

## 2012-04-21 NOTE — Telephone Encounter (Signed)
Nurse calls with PT/INR results done yesterday.  PT= 15.9  INR= 1.6  Pt currently on COumadin 3mg  tabs on M,W, F and Coumadin 3mg  1 1/2 tabs all other days

## 2012-04-21 NOTE — Telephone Encounter (Signed)
Adjusted dose. See coumadin flowsheet'

## 2012-04-23 ENCOUNTER — Telehealth: Payer: Self-pay

## 2012-04-23 NOTE — Telephone Encounter (Signed)
FYI  Home Health called: Domenik has two small skin cuts. His wife has dressed both wounds.

## 2012-04-27 ENCOUNTER — Ambulatory Visit: Payer: Self-pay | Admitting: Family Medicine

## 2012-04-27 ENCOUNTER — Telehealth: Payer: Self-pay | Admitting: *Deleted

## 2012-04-27 DIAGNOSIS — I4891 Unspecified atrial fibrillation: Secondary | ICD-10-CM

## 2012-04-27 NOTE — Telephone Encounter (Signed)
See anticoag l is flow sheet.

## 2012-04-27 NOTE — Telephone Encounter (Signed)
Nurse with [facility] calls with INR results from today. PT= 21.2   INR= 2.1. Pt currently taking Coumadin 3mg  M,W, TH, F and Coumadin 1.5mg  all other days

## 2012-04-27 NOTE — Telephone Encounter (Signed)
Nurse with [facility] notified of MD instructions and she will notify patient

## 2012-05-06 ENCOUNTER — Other Ambulatory Visit: Payer: Self-pay | Admitting: Family Medicine

## 2012-05-11 ENCOUNTER — Telehealth: Payer: Self-pay | Admitting: *Deleted

## 2012-05-11 ENCOUNTER — Ambulatory Visit: Payer: Self-pay | Admitting: Family Medicine

## 2012-05-11 DIAGNOSIS — I4891 Unspecified atrial fibrillation: Secondary | ICD-10-CM

## 2012-05-11 NOTE — Telephone Encounter (Signed)
Ok to hold prozac if he is feeling better off of it.  See coumadin flowsheet. N ochange. Same dose. Repeat in 3 weeks.

## 2012-05-11 NOTE — Telephone Encounter (Signed)
Nurse Practitioner  with [facility] would like to speak with you in regards to this patients issues and care. I asked if I could relay message or help her and she said no she needs to speak with you personally.

## 2012-05-11 NOTE — Telephone Encounter (Signed)
22.6 pt and 2.3 INR/ see anticoagulation encounter  Cohl has been off his Prozac for 1 week. His wife states she is not giving the Prozac to him because he looks like he is a "Zombie" when he is on it.   [facility] [phone]

## 2012-05-11 NOTE — Telephone Encounter (Signed)
Alvino Chapel from [place] advised.

## 2012-05-18 ENCOUNTER — Telehealth: Payer: Self-pay | Admitting: *Deleted

## 2012-05-18 NOTE — Telephone Encounter (Signed)
Yes I am ok with that. Do I need to put it in the system or can you do a verbal?

## 2012-05-18 NOTE — Telephone Encounter (Signed)
Nurse with [facility] calls and wanted to know if they could get an order for UA and culture for this patient. Urine has a lot of sediment in it and looks thicker than usual. Please advise

## 2012-05-18 NOTE — Telephone Encounter (Signed)
Spoke with Alvino Chapel and gave verbal order for UA and culture. Will do Wednesday

## 2012-05-22 ENCOUNTER — Telehealth: Payer: Self-pay | Admitting: Physician Assistant

## 2012-05-22 MED ORDER — SULFAMETHOXAZOLE-TRIMETHOPRIM 800-160 MG PO TABS: 1.0000 | ORAL_TABLET | Freq: Two times a day (BID) | ORAL | Status: DC

## 2012-05-22 NOTE — Telephone Encounter (Signed)
Please call patient and let them know abx is at pharmacy to pick up for UTI. If symptoms not resolved after treatment call office.

## 2012-05-22 NOTE — Telephone Encounter (Signed)
Pt wife notified.

## 2012-05-28 ENCOUNTER — Other Ambulatory Visit: Payer: Self-pay | Admitting: Family Medicine

## 2012-06-01 ENCOUNTER — Telehealth: Payer: Self-pay | Admitting: *Deleted

## 2012-06-01 NOTE — Telephone Encounter (Signed)
Nurse with [facility] calls with INR results. PT= 1.71   INR=1.73

## 2012-06-01 NOTE — Telephone Encounter (Signed)
Increase coumadin to 3mg  Monday, Tuesday, Wednesday, Thursday, Friday and then 1.5mg  on Saturday and Sunday. Recheck in 1 week.

## 2012-06-01 NOTE — Telephone Encounter (Signed)
[name] with [facility] notified of instructions and she will contact pt with instructions.

## 2012-06-12 ENCOUNTER — Ambulatory Visit: Payer: Self-pay | Admitting: Family Medicine

## 2012-06-12 DIAGNOSIS — I4891 Unspecified atrial fibrillation: Secondary | ICD-10-CM

## 2012-06-17 ENCOUNTER — Other Ambulatory Visit: Payer: Self-pay | Admitting: Family Medicine

## 2012-06-26 ENCOUNTER — Other Ambulatory Visit: Payer: Self-pay

## 2012-06-26 ENCOUNTER — Other Ambulatory Visit: Payer: Self-pay | Admitting: Family Medicine

## 2012-06-26 ENCOUNTER — Encounter: Payer: Self-pay | Admitting: Family Medicine

## 2012-06-26 MED ORDER — WARFARIN SODIUM 3 MG PO TABS: 3.0000 mg | ORAL_TABLET | Freq: Every day | ORAL | Status: DC

## 2012-06-29 ENCOUNTER — Encounter: Payer: Self-pay | Admitting: Family Medicine

## 2012-06-30 ENCOUNTER — Telehealth: Payer: Self-pay | Admitting: *Deleted

## 2012-06-30 NOTE — Telephone Encounter (Signed)
Letter printed.

## 2012-06-30 NOTE — Telephone Encounter (Signed)
Christina with [facility] calls and was wondering who was handling his inr and who was his PCP. I informed her that you were his PCP and have been for a long time and that you were handling his INR. She states that he is seeing Dr. Elam Dutch as PCP with them and was admitted to them on 05/11/2012 but doesn't by who and that they have a visit with him tomorrow. I asked what there facility was as I have never heard of them and she said they are a PCP practice that goes into the patient homes instead of the patient going into the office. [facility] checks his INR's but they did not refer him to this practice either. Basically she needs a letter from you stating if you are gonna be his PCP or if not- either way they need a letter faxed to them stating this. Fax to [phone]

## 2012-06-30 NOTE — Telephone Encounter (Signed)
Spoke with [name] pt wife and she states that at this time it would better for the other doctor to see him as anytime he has to go anywhere he has to be transported by ambulance. But she did state that when he is mobile again and able to get out she wants him to come back here. So go ahead and type letter

## 2012-06-30 NOTE — Telephone Encounter (Signed)
I think it should be up to the patient and his wife.  Please tell [name] that it doesn't hurt my feeling ata ll if wants to change him. I want him to get the best care possible and if they are able and willing to do home visits and that may actually be better for him.

## 2012-07-25 ENCOUNTER — Other Ambulatory Visit: Payer: Self-pay

## 2012-08-01 ENCOUNTER — Other Ambulatory Visit: Payer: Self-pay | Admitting: Family Medicine

## 2012-12-17 ENCOUNTER — Other Ambulatory Visit: Payer: Self-pay

## 2013-04-15 ENCOUNTER — Other Ambulatory Visit: Payer: Self-pay

## 2013-07-01 ENCOUNTER — Encounter (HOSPITAL_COMMUNITY): Payer: Self-pay | Admitting: Emergency Medicine

## 2013-07-01 ENCOUNTER — Other Ambulatory Visit: Payer: Self-pay | Admitting: *Deleted

## 2013-07-01 ENCOUNTER — Inpatient Hospital Stay (HOSPITAL_COMMUNITY)
Admission: EM | Admit: 2013-07-01 | Discharge: 2013-07-07 | DRG: 237 | Disposition: A | Discharge status: Skilled Nursing Facility | Payer: PRIVATE HEALTH INSURANCE | Attending: Vascular Surgery | Admitting: Vascular Surgery

## 2013-07-01 ENCOUNTER — Emergency Department (HOSPITAL_COMMUNITY): Payer: PRIVATE HEALTH INSURANCE | Admitting: Anesthesiology

## 2013-07-01 ENCOUNTER — Encounter (HOSPITAL_COMMUNITY): Payer: PRIVATE HEALTH INSURANCE | Admitting: Anesthesiology

## 2013-07-01 ENCOUNTER — Encounter (HOSPITAL_COMMUNITY)
Admission: EM | Disposition: A | Discharge status: Skilled Nursing Facility | Payer: Self-pay | Source: Home / Self Care | Attending: Vascular Surgery

## 2013-07-01 DIAGNOSIS — I959 Hypotension, unspecified: Secondary | ICD-10-CM

## 2013-07-01 DIAGNOSIS — I252 Old myocardial infarction: Secondary | ICD-10-CM

## 2013-07-01 DIAGNOSIS — L8994 Pressure ulcer of unspecified site, stage 4: Secondary | ICD-10-CM | POA: Diagnosis present

## 2013-07-01 DIAGNOSIS — Z8249 Family history of ischemic heart disease and other diseases of the circulatory system: Secondary | ICD-10-CM

## 2013-07-01 DIAGNOSIS — Z9079 Acquired absence of other genital organ(s): Secondary | ICD-10-CM

## 2013-07-01 DIAGNOSIS — M129 Arthropathy, unspecified: Secondary | ICD-10-CM | POA: Diagnosis present

## 2013-07-01 DIAGNOSIS — Z8546 Personal history of malignant neoplasm of prostate: Secondary | ICD-10-CM

## 2013-07-01 DIAGNOSIS — Z833 Family history of diabetes mellitus: Secondary | ICD-10-CM

## 2013-07-01 DIAGNOSIS — Z889 Allergy status to unspecified drugs, medicaments and biological substances status: Secondary | ICD-10-CM

## 2013-07-01 DIAGNOSIS — IMO0002 Reserved for concepts with insufficient information to code with codable children: Secondary | ICD-10-CM

## 2013-07-01 DIAGNOSIS — Z9849 Cataract extraction status, unspecified eye: Secondary | ICD-10-CM

## 2013-07-01 DIAGNOSIS — D62 Acute posthemorrhagic anemia: Secondary | ICD-10-CM | POA: Diagnosis not present

## 2013-07-01 DIAGNOSIS — I10 Essential (primary) hypertension: Secondary | ICD-10-CM | POA: Diagnosis present

## 2013-07-01 DIAGNOSIS — E119 Type 2 diabetes mellitus without complications: Secondary | ICD-10-CM

## 2013-07-01 DIAGNOSIS — I4891 Unspecified atrial fibrillation: Secondary | ICD-10-CM

## 2013-07-01 DIAGNOSIS — Z880 Allergy status to penicillin: Secondary | ICD-10-CM

## 2013-07-01 DIAGNOSIS — E785 Hyperlipidemia, unspecified: Secondary | ICD-10-CM | POA: Diagnosis present

## 2013-07-01 DIAGNOSIS — N179 Acute kidney failure, unspecified: Secondary | ICD-10-CM

## 2013-07-01 DIAGNOSIS — D649 Anemia, unspecified: Secondary | ICD-10-CM

## 2013-07-01 DIAGNOSIS — Z7901 Long term (current) use of anticoagulants: Secondary | ICD-10-CM

## 2013-07-01 DIAGNOSIS — I2510 Atherosclerotic heart disease of native coronary artery without angina pectoris: Secondary | ICD-10-CM | POA: Diagnosis present

## 2013-07-01 DIAGNOSIS — G9340 Encephalopathy, unspecified: Secondary | ICD-10-CM

## 2013-07-01 DIAGNOSIS — I998 Other disorder of circulatory system: Secondary | ICD-10-CM

## 2013-07-01 DIAGNOSIS — I70209 Unspecified atherosclerosis of native arteries of extremities, unspecified extremity: Secondary | ICD-10-CM | POA: Diagnosis present

## 2013-07-01 DIAGNOSIS — I5031 Acute diastolic (congestive) heart failure: Secondary | ICD-10-CM

## 2013-07-01 DIAGNOSIS — J9600 Acute respiratory failure, unspecified whether with hypoxia or hypercapnia: Secondary | ICD-10-CM

## 2013-07-01 DIAGNOSIS — I745 Embolism and thrombosis of iliac artery: Principal | ICD-10-CM | POA: Diagnosis present

## 2013-07-01 DIAGNOSIS — I739 Peripheral vascular disease, unspecified: Secondary | ICD-10-CM

## 2013-07-01 DIAGNOSIS — Z96649 Presence of unspecified artificial hip joint: Secondary | ICD-10-CM

## 2013-07-01 DIAGNOSIS — Z79899 Other long term (current) drug therapy: Secondary | ICD-10-CM

## 2013-07-01 DIAGNOSIS — I708 Atherosclerosis of other arteries: Secondary | ICD-10-CM | POA: Diagnosis present

## 2013-07-01 DIAGNOSIS — Z87891 Personal history of nicotine dependence: Secondary | ICD-10-CM

## 2013-07-01 DIAGNOSIS — Z9911 Dependence on respirator [ventilator] status: Secondary | ICD-10-CM

## 2013-07-01 DIAGNOSIS — N289 Disorder of kidney and ureter, unspecified: Secondary | ICD-10-CM | POA: Diagnosis present

## 2013-07-01 DIAGNOSIS — E440 Moderate protein-calorie malnutrition: Secondary | ICD-10-CM

## 2013-07-01 DIAGNOSIS — S88119A Complete traumatic amputation at level between knee and ankle, unspecified lower leg, initial encounter: Secondary | ICD-10-CM

## 2013-07-01 DIAGNOSIS — F0390 Unspecified dementia without behavioral disturbance: Secondary | ICD-10-CM

## 2013-07-01 DIAGNOSIS — R1310 Dysphagia, unspecified: Secondary | ICD-10-CM

## 2013-07-01 DIAGNOSIS — L89109 Pressure ulcer of unspecified part of back, unspecified stage: Secondary | ICD-10-CM | POA: Diagnosis present

## 2013-07-01 DIAGNOSIS — R5381 Other malaise: Secondary | ICD-10-CM

## 2013-07-01 DIAGNOSIS — I743 Embolism and thrombosis of arteries of the lower extremities: Secondary | ICD-10-CM

## 2013-07-01 DIAGNOSIS — Z951 Presence of aortocoronary bypass graft: Secondary | ICD-10-CM

## 2013-07-01 DIAGNOSIS — E876 Hypokalemia: Secondary | ICD-10-CM | POA: Diagnosis not present

## 2013-07-01 HISTORY — PX: AMPUTATION: SHX166

## 2013-07-01 HISTORY — PX: THROMBECTOMY FEMORAL ARTERY: SHX6406

## 2013-07-01 LAB — BASIC METABOLIC PANEL
BUN: 32 mg/dL — AB (ref 6–23)
CHLORIDE: 102 meq/L (ref 96–112)
CO2: 23 mEq/L (ref 19–32)
CREATININE: 1.14 mg/dL (ref 0.50–1.35)
Calcium: 7.4 mg/dL — ABNORMAL LOW (ref 8.4–10.5)
GFR calc Af Amer: 69 mL/min — ABNORMAL LOW (ref >90)
GFR, EST NON AFRICAN AMERICAN: 59 mL/min — AB (ref >90)
GLUCOSE: 161 mg/dL — AB (ref 70–99)
POTASSIUM: 3.4 meq/L — AB (ref 3.7–5.3)
Sodium: 138 mEq/L (ref 137–147)

## 2013-07-01 LAB — CBC WITH DIFFERENTIAL/PLATELET
BASOS PCT: 0 % (ref 0–1)
Basophils Absolute: 0 10*3/uL (ref 0.0–0.1)
EOS ABS: 0 10*3/uL (ref 0.0–0.7)
Eosinophils Relative: 0 % (ref 0–5)
HEMATOCRIT: 22.3 % — AB (ref 39.0–52.0)
HEMOGLOBIN: 7.5 g/dL — AB (ref 13.0–17.0)
LYMPHS ABS: 1.8 10*3/uL (ref 0.7–4.0)
Lymphocytes Relative: 19 % (ref 12–46)
MCH: 30.7 pg (ref 26.0–34.0)
MCHC: 33.6 g/dL (ref 30.0–36.0)
MCV: 91.4 fL (ref 78.0–100.0)
MONO ABS: 0.8 10*3/uL (ref 0.1–1.0)
MONOS PCT: 8 % (ref 3–12)
NEUTROS PCT: 73 % (ref 43–77)
Neutro Abs: 6.8 10*3/uL (ref 1.7–7.7)
Platelets: 168 10*3/uL (ref 150–400)
RBC: 2.44 MIL/uL — AB (ref 4.22–5.81)
RDW: 16.2 % — ABNORMAL HIGH (ref 11.5–15.5)
WBC: 9.4 10*3/uL (ref 4.0–10.5)

## 2013-07-01 LAB — PREPARE RBC (CROSSMATCH)

## 2013-07-01 LAB — APTT: APTT: 42 s — AB (ref 24–37)

## 2013-07-01 LAB — PROTIME-INR
INR: 1.33 (ref 0.00–1.49)
Prothrombin Time: 16.2 seconds — ABNORMAL HIGH (ref 11.6–15.2)

## 2013-07-01 SURGERY — THROMBECTOMY, ARTERY, FEMORAL
Anesthesia: General | Laterality: Left

## 2013-07-01 MED ORDER — PHENYLEPHRINE 40 MCG/ML (10ML) SYRINGE FOR IV PUSH (FOR BLOOD PRESSURE SUPPORT)
PREFILLED_SYRINGE | INTRAVENOUS | Status: AC
  Filled 2013-07-01: qty 10

## 2013-07-01 MED ORDER — HEPARIN SODIUM (PORCINE) 1000 UNIT/ML IJ SOLN
INTRAMUSCULAR | Status: AC
  Filled 2013-07-01: qty 1

## 2013-07-01 MED ORDER — THROMBIN 20000 UNITS EX SOLR
CUTANEOUS | Status: AC
  Filled 2013-07-01: qty 20000

## 2013-07-01 MED ORDER — PHENYLEPHRINE HCL 10 MG/ML IJ SOLN
INTRAMUSCULAR | Status: DC | PRN
  Administered 2013-07-01: 40 ug via INTRAVENOUS

## 2013-07-01 MED ORDER — SUCCINYLCHOLINE CHLORIDE 20 MG/ML IJ SOLN
INTRAMUSCULAR | Status: DC | PRN
  Administered 2013-07-01: 120 mg via INTRAVENOUS

## 2013-07-01 MED ORDER — PROPOFOL 10 MG/ML IV BOLUS
INTRAVENOUS | Status: AC
  Filled 2013-07-01: qty 20

## 2013-07-01 MED ORDER — FENTANYL CITRATE 0.05 MG/ML IJ SOLN
INTRAMUSCULAR | Status: AC
  Filled 2013-07-01: qty 5

## 2013-07-01 MED ORDER — HEPARIN SODIUM (PORCINE) 1000 UNIT/ML IJ SOLN
INTRAMUSCULAR | Status: DC | PRN
  Administered 2013-07-01: 5000 [IU] via INTRAVENOUS

## 2013-07-01 MED ORDER — SUCCINYLCHOLINE CHLORIDE 20 MG/ML IJ SOLN
INTRAMUSCULAR | Status: AC
  Filled 2013-07-01: qty 1

## 2013-07-01 MED ORDER — LIDOCAINE HCL (CARDIAC) 20 MG/ML IV SOLN
INTRAVENOUS | Status: AC
  Filled 2013-07-01: qty 5

## 2013-07-01 MED ORDER — SODIUM CHLORIDE 0.9 % IV BOLUS (SEPSIS)
1000.0000 mL | Freq: Once | INTRAVENOUS | Status: AC
  Administered 2013-07-01: 1000 mL via INTRAVENOUS

## 2013-07-01 MED ORDER — FENTANYL CITRATE 0.05 MG/ML IJ SOLN
INTRAMUSCULAR | Status: DC | PRN
  Administered 2013-07-01: 100 ug via INTRAVENOUS
  Administered 2013-07-01 – 2013-07-02 (×4): 25 ug via INTRAVENOUS
  Administered 2013-07-02: 50 ug via INTRAVENOUS

## 2013-07-01 MED ORDER — LIDOCAINE HCL (CARDIAC) 20 MG/ML IV SOLN
INTRAVENOUS | Status: DC | PRN
  Administered 2013-07-01: 60 mg via INTRAVENOUS

## 2013-07-01 MED ORDER — SODIUM CHLORIDE 0.9 % IJ SOLN
INTRAMUSCULAR | Status: AC
  Filled 2013-07-01: qty 10

## 2013-07-01 MED ORDER — ARTIFICIAL TEARS OP OINT
TOPICAL_OINTMENT | OPHTHALMIC | Status: AC
  Filled 2013-07-01: qty 3.5

## 2013-07-01 MED ORDER — PROPOFOL 10 MG/ML IV BOLUS
INTRAVENOUS | Status: DC | PRN
  Administered 2013-07-01: 100 mg via INTRAVENOUS
  Administered 2013-07-02: 20 mg via INTRAVENOUS

## 2013-07-01 MED ORDER — VANCOMYCIN HCL 1000 MG IV SOLR
1000.0000 mg | INTRAVENOUS | Status: DC | PRN
  Administered 2013-07-01: 1000 mg via INTRAVENOUS

## 2013-07-01 MED ORDER — 0.9 % SODIUM CHLORIDE (POUR BTL) OPTIME
TOPICAL | Status: DC | PRN
  Administered 2013-07-01: 1000 mL

## 2013-07-01 MED ORDER — LACTATED RINGERS IV SOLN
INTRAVENOUS | Status: DC | PRN
  Administered 2013-07-01 – 2013-07-02 (×2): via INTRAVENOUS

## 2013-07-01 MED ORDER — PHENYLEPHRINE HCL 10 MG/ML IJ SOLN
10.0000 mg | INTRAVENOUS | Status: DC | PRN
  Administered 2013-07-01: 25 ug/min via INTRAVENOUS

## 2013-07-01 MED ORDER — ARTIFICIAL TEARS OP OINT
TOPICAL_OINTMENT | OPHTHALMIC | Status: DC | PRN
  Administered 2013-07-01: 1 via OPHTHALMIC

## 2013-07-01 MED ORDER — ONDANSETRON HCL 4 MG/2ML IJ SOLN
INTRAMUSCULAR | Status: AC
  Filled 2013-07-01: qty 2

## 2013-07-01 MED ORDER — SODIUM CHLORIDE 0.9 % IR SOLN
Status: DC | PRN
  Administered 2013-07-01: 22:00:00

## 2013-07-01 SURGICAL SUPPLY — 97 items
BANDAGE ELASTIC 4 VELCRO ST LF (GAUZE/BANDAGES/DRESSINGS) ×3 IMPLANT
BANDAGE ELASTIC 6 VELCRO ST LF (GAUZE/BANDAGES/DRESSINGS) ×3 IMPLANT
BANDAGE ESMARK 6X9 LF (GAUZE/BANDAGES/DRESSINGS) ×1 IMPLANT
BANDAGE GAUZE ELAST BULKY 4 IN (GAUZE/BANDAGES/DRESSINGS) ×6 IMPLANT
BLADE SAW SAG 29X58X.64 (BLADE) ×3 IMPLANT
BNDG COHESIVE 6X5 TAN STRL LF (GAUZE/BANDAGES/DRESSINGS) ×3 IMPLANT
BNDG ESMARK 6X9 LF (GAUZE/BANDAGES/DRESSINGS) ×3
BNDG GAUZE ELAST 4 BULKY (GAUZE/BANDAGES/DRESSINGS) ×6 IMPLANT
CANISTER SUCTION 2500CC (MISCELLANEOUS) ×3 IMPLANT
CATH EMB 3FR 80CM (CATHETERS) ×3 IMPLANT
CATH EMB 4FR 80CM (CATHETERS) ×6 IMPLANT
CATH EMB 5FR 80CM (CATHETERS) ×3 IMPLANT
CLIP TI MEDIUM 24 (CLIP) ×6 IMPLANT
CLIP TI MEDIUM 6 (CLIP) ×3 IMPLANT
CLIP TI WIDE RED SMALL 24 (CLIP) ×6 IMPLANT
COVER PROBE W GEL 5X96 (DRAPES) ×3 IMPLANT
COVER SURGICAL LIGHT HANDLE (MISCELLANEOUS) ×3 IMPLANT
COVER TABLE BACK 60X90 (DRAPES) ×3 IMPLANT
CUFF TOURNIQUET SINGLE 24IN (TOURNIQUET CUFF) ×3 IMPLANT
CUFF TOURNIQUET SINGLE 34IN LL (TOURNIQUET CUFF) IMPLANT
CUFF TOURNIQUET SINGLE 44IN (TOURNIQUET CUFF) IMPLANT
DRAIN CHANNEL 10M FLAT 3/4 FLT (DRAIN) ×3 IMPLANT
DRAIN CHANNEL 15F RND FF W/TCR (WOUND CARE) IMPLANT
DRAIN CHANNEL 19F RND (DRAIN) IMPLANT
DRAPE C-ARM 42X72 X-RAY (DRAPES) ×3 IMPLANT
DRAPE ORTHO SPLIT 77X108 STRL (DRAPES) ×2
DRAPE PROXIMA HALF (DRAPES) ×3 IMPLANT
DRAPE SURG ORHT 6 SPLT 77X108 (DRAPES) ×1 IMPLANT
DRAPE U-SHAPE 47X51 STRL (DRAPES) ×3 IMPLANT
DRAPE WARM FLUID 44X44 (DRAPE) ×3 IMPLANT
DRSG ADAPTIC 3X8 NADH LF (GAUZE/BANDAGES/DRESSINGS) ×3 IMPLANT
DRSG COVADERM 4X10 (GAUZE/BANDAGES/DRESSINGS) IMPLANT
DRSG COVADERM 4X6 (GAUZE/BANDAGES/DRESSINGS) ×3 IMPLANT
DRSG COVADERM 4X8 (GAUZE/BANDAGES/DRESSINGS) IMPLANT
ELECT REM PT RETURN 9FT ADLT (ELECTROSURGICAL) ×3
ELECTRODE REM PT RTRN 9FT ADLT (ELECTROSURGICAL) ×1 IMPLANT
EVACUATOR SILICONE 100CC (DRAIN) ×3 IMPLANT
GAUZE XEROFORM 5X9 LF (GAUZE/BANDAGES/DRESSINGS) ×3 IMPLANT
GLOVE BIO SURGEON STRL SZ 6.5 (GLOVE) ×4 IMPLANT
GLOVE BIO SURGEON STRL SZ7 (GLOVE) ×3 IMPLANT
GLOVE BIO SURGEONS STRL SZ 6.5 (GLOVE) ×2
GLOVE BIOGEL PI IND STRL 6.5 (GLOVE) ×2 IMPLANT
GLOVE BIOGEL PI IND STRL 7.0 (GLOVE) ×2 IMPLANT
GLOVE BIOGEL PI IND STRL 7.5 (GLOVE) ×2 IMPLANT
GLOVE BIOGEL PI INDICATOR 6.5 (GLOVE) ×4
GLOVE BIOGEL PI INDICATOR 7.0 (GLOVE) ×4
GLOVE BIOGEL PI INDICATOR 7.5 (GLOVE) ×4
GLOVE SS BIOGEL STRL SZ 7 (GLOVE) ×1 IMPLANT
GLOVE SUPERSENSE BIOGEL SZ 7 (GLOVE) ×2
GOWN STRL REUS W/ TWL LRG LVL3 (GOWN DISPOSABLE) ×3 IMPLANT
GOWN STRL REUS W/TWL LRG LVL3 (GOWN DISPOSABLE) ×6
INSERT FOGARTY SM (MISCELLANEOUS) ×3 IMPLANT
KIT BASIN OR (CUSTOM PROCEDURE TRAY) ×3 IMPLANT
KIT ROOM TURNOVER OR (KITS) ×3 IMPLANT
MARKER GRAFT CORONARY BYPASS (MISCELLANEOUS) IMPLANT
MARKER SKIN DUAL TIP RULER LAB (MISCELLANEOUS) ×6 IMPLANT
NS IRRIG 1000ML POUR BTL (IV SOLUTION) ×9 IMPLANT
PACK GENERAL/GYN (CUSTOM PROCEDURE TRAY) IMPLANT
PACK PERIPHERAL VASCULAR (CUSTOM PROCEDURE TRAY) ×3 IMPLANT
PAD ARMBOARD 7.5X6 YLW CONV (MISCELLANEOUS) ×6 IMPLANT
PADDING CAST COTTON 6X4 STRL (CAST SUPPLIES) IMPLANT
PATCH VASCULAR VASCU GUARD 1X6 (Vascular Products) ×3 IMPLANT
SET MICROPUNCTURE 5F STIFF (MISCELLANEOUS) IMPLANT
SPONGE GAUZE 4X4 12PLY (GAUZE/BANDAGES/DRESSINGS) ×6 IMPLANT
SPONGE LAP 18X18 X RAY DECT (DISPOSABLE) ×3 IMPLANT
SPONGE SURGIFOAM ABS GEL 100 (HEMOSTASIS) IMPLANT
STAPLER VISISTAT 35W (STAPLE) ×6 IMPLANT
STOCKINETTE IMPERVIOUS LG (DRAPES) ×3 IMPLANT
STOPCOCK 4 WAY LG BORE MALE ST (IV SETS) ×3 IMPLANT
SUT ETHILON 3 0 PS 1 (SUTURE) IMPLANT
SUT GORETEX 5 0 TT13 24 (SUTURE) IMPLANT
SUT GORETEX 6.0 TT13 (SUTURE) IMPLANT
SUT MNCRL AB 4-0 PS2 18 (SUTURE) ×9 IMPLANT
SUT PROLENE 5 0 C 1 24 (SUTURE) ×9 IMPLANT
SUT PROLENE 5 0 C 1 36 (SUTURE) ×3 IMPLANT
SUT PROLENE 6 0 BV (SUTURE) ×33 IMPLANT
SUT PROLENE 7 0 BV 1 (SUTURE) ×3 IMPLANT
SUT SILK 0 TIES 10X30 (SUTURE) ×3 IMPLANT
SUT SILK 2 0 (SUTURE) ×2
SUT SILK 2 0 FS (SUTURE) ×3 IMPLANT
SUT SILK 2 0 SH (SUTURE) ×3 IMPLANT
SUT SILK 2-0 18XBRD TIE 12 (SUTURE) ×1 IMPLANT
SUT SILK 3 0 (SUTURE)
SUT SILK 3-0 18XBRD TIE 12 (SUTURE) IMPLANT
SUT VIC AB 2-0 CT1 18 (SUTURE) ×9 IMPLANT
SUT VIC AB 2-0 CT1 27 (SUTURE) ×2
SUT VIC AB 2-0 CT1 TAPERPNT 27 (SUTURE) ×1 IMPLANT
SUT VIC AB 3-0 SH 18 (SUTURE) ×3 IMPLANT
SUT VIC AB 3-0 SH 27 (SUTURE) ×2
SUT VIC AB 3-0 SH 27X BRD (SUTURE) ×1 IMPLANT
SYR 3ML LL SCALE MARK (SYRINGE) ×6 IMPLANT
TOWEL OR 17X24 6PK STRL BLUE (TOWEL DISPOSABLE) ×6 IMPLANT
TOWEL OR 17X26 10 PK STRL BLUE (TOWEL DISPOSABLE) ×6 IMPLANT
TRAY FOLEY CATH 16FRSI W/METER (SET/KITS/TRAYS/PACK) ×3 IMPLANT
TUBING EXTENTION W/L.L. (IV SETS) IMPLANT
UNDERPAD 30X30 INCONTINENT (UNDERPADS AND DIAPERS) ×3 IMPLANT
WATER STERILE IRR 1000ML POUR (IV SOLUTION) ×3 IMPLANT

## 2013-07-01 NOTE — ED Notes (Signed)
Patient is a R BKA patient with diabetes. Facility reports patient has been having increased pain in his L leg with weak pulses now in his L leg. Leg is very tender to light palpation. Ax4, NAD.

## 2013-07-01 NOTE — Consult Note (Addendum)
[facility] [place]  Referred by:  Dr. Karle Starch [facility] ED)  Reason for referral: left foot ischemia  History of Present Illness  [name] is a 80 y.o. (1932/05/24) male known history of PAD requiring prior R BKA who presents with chief complaint: left leg pain.  Pt's memory is somewhat limited so history obtained from patient and family.  Onset this AM per patient, but per family definitely ~1 pm, pt's leg was found to be painful and pale.  The patient notes mild-moderate pain in left foot, worse with movement of the foot or manipulation with "painful" character.  The patient notes also lost of motor and sensation in the left foot.  The patient was referred from his PCP to ER by report.  The patient previously has been on Coumadin for atrial fibrillation but was taken off recently due to anemia of unknown etiology.  The patient does not ambulate at this point.  He been developing stasis ulcers.  Pt's atherosclerotic risk factors included: HLD, HTN, DM, and prior smoking.  Past Medical History  Diagnosis Date  . CAD (coronary artery disease)   . Hyperlipidemia   . Diabetes mellitus   . Hypertension   . Suprapubic catheter   . Peripheral vascular disease   . Arthritis   . Myocardial infarction   . Cancer     prostate cancer  . A-fib    Past Surgical History  Procedure Laterality Date  . Prostatectomy      radiation therapy  . Bladder cath    . Laminectomy      lumbar  . Lumbar disc surgery    . 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   Marland Kitchen Umbilical hernia repair  5573'U, 2025    umbilical hernia repair in 4270W, super umbilical hernia repaired in 2006  . Coronary artery bypass graft      2008  . Total hip arthroplasty      RT  . Cataract extraction  2012    Left 03/2011, Right 04/2011  . Amputation  12/03/2011    Procedure: AMPUTATION BELOW KNEE;  Surgeon: Elam Dutch, MD;  Location: [facility] OR;  Service: Vascular;   Laterality: Right;  . Esophagogastroduodenoscopy  02/12/2012    Procedure: ESOPHAGOGASTRODUODENOSCOPY (EGD);  Surgeon: Milus Banister, MD;  Location: [facility];  Service: Endoscopy;  Laterality: N/A;  . Peg placement  02/18/2012    Procedure: PERCUTANEOUS ENDOSCOPIC GASTROSTOMY (PEG) PLACEMENT;  Surgeon: Inda Castle, MD;  Location: [place];  Service: Endoscopy;  Laterality: N/A;   History   Social History  . Marital Status: Married    Spouse Name: Tandy Gaw    Number of Children: N/A  . Years of Education: N/A   Occupational History  . Retired Librarian, academic for [employer].     Social History Main Topics  . Smoking status: Former Smoker -- 0.25 packs/day for 10 years    Types: Cigarettes    Quit date: 06/10/1970  . Smokeless tobacco: Never Used  . Alcohol Use: No  . Drug Use: No  . Sexual Activity: Not Currently   Other Topics Concern  . 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 No caffeine.     Family History  Problem Relation Age of Onset  . Coronary artery disease      siblings  . Coronary artery disease Father   . Diabetes  Sister   . Hyperlipidemia      family history   No current facility-administered medications on file prior to encounter.   Current Outpatient Prescriptions on File Prior to Encounter  Medication Sig Dispense Refill  . ALPRAZolam (XANAX) 0.5 MG tablet TAKE ONE TABLET BY MOUTH AT BEDTIME AS NEEDED  30 tablet  0  . atorvastatin (LIPITOR) 40 MG tablet Take 1 tablet (40 mg total) by mouth at bedtime.  90 tablet  3  . FLUoxetine HCl 60 MG TABS Take 60 mg by mouth daily.  30 tablet  1  . furosemide (LASIX) 20 MG tablet TAKE ONE TABLET BY MOUTH EVERY DAY  90 tablet  0  . HYDROcodone-acetaminophen (NORCO/VICODIN) 5-325 MG per tablet TAKE ONE TABLET BY MOUTH EVERY 4 HOURS AS NEEDED  90 tablet  0  . metFORMIN (GLUCOPHAGE) 500 MG tablet Take 1 tablet (500 mg total) by mouth 2 (two) times daily.   180 tablet  3  . metoprolol tartrate (LOPRESSOR) 25 MG tablet TAKE ONE TABLET BY MOUTH TWICE DAILY  180 tablet  0  . Nutritional Supplements (FEEDING SUPPLEMENT, OSMOLITE 1.2 CAL,) LIQD Place 1,000 mLs into feeding tube continuous. Place 1,000 mLs into feeding tube continuous. Start Osmolite 1.2 at rate of 70 ml/hr from 7pm-7am  1000 mL  10  . Omega-3 Fatty Acids (FISH OIL) 1200 MG CAPS Take 1 capsule by mouth daily.      Marland Kitchen omeprazole (PRILOSEC) 20 MG capsule Take 20 mg by mouth daily.      Marland Kitchen sulfamethoxazole-trimethoprim (SEPTRA DS) 800-160 MG per tablet Take 1 tablet by mouth 2 (two) times daily. For 7 days.  14 tablet  0  . warfarin (COUMADIN) 3 MG tablet Take 1 tablet (3 mg total) by mouth daily.  40 tablet  4   Allergies  Allergen Reactions  . Levaquin [Levofloxacin] Rash  . Morphine And Related Other (See Comments)    Confusion, agitation  . Codeine Nausea And Vomiting    Noted on MAR  . Penicillins Rash    Noted on MAR   REVIEW OF SYSTEMS:  (Positives checked otherwise negative)  CARDIOVASCULAR:  []  chest pain, []  chest pressure, []  palpitations, []  shortness of breath when laying flat, []  shortness of breath with exertion,  []  pain in feet when walking, []  pain in feet when laying flat, []  history of blood clot in veins (DVT), []  history of phlebitis, []  swelling in legs, []  varicose veins  PULMONARY:  []  productive cough, []  asthma, []  wheezing  NEUROLOGIC:  []  weakness in arms or legs, []  numbness in arms or legs, []  difficulty speaking or slurred speech, []  temporary loss of vision in one eye, []  dizziness  HEMATOLOGIC:  []  bleeding problems, []  problems with blood clotting too easily  MUSCULOSKEL:  []  joint pain, []  joint swelling  GASTROINTEST:  []  vomiting blood, []  blood in stool     GENITOURINARY:  []  burning with urination, []  blood in urine  PSYCHIATRIC:  []  history of major depression  INTEGUMENTARY:  []  rashes, []  ulcers  CONSTITUTIONAL:  []  fever, []   chills  For VQI Use Only  PRE-ADM LIVING: Nursing home  AMB STATUS: Wheelchair  CAD Sx: None  PRIOR CHF: None  STRESS TEST: [x]  No, [ ]  Normal, [ ]  + ischemia, [ ]  + MI, [ ]  Both  Physical Examination  Filed Vitals:   07/01/13 1932 07/01/13 2023 07/01/13 2030  BP: 92/56 87/62 87/62   Pulse: 111 110 106  Temp: 98.7  F (37.1 C)    TempSrc: Oral    Resp: 16 16 11   SpO2: 98% 98% 98%    There is no weight on file to calculate BMI.  General: A&O x 3, WD, elderly, cachectic  Head: [place]/AT, Temporalis wasting,   Ear/Nose/Throat: Hearing grossly intact, nares w/o erythema or drainage, oropharynx w/o Erythema/Exudate, Mallampati score: 2  Eyes: PERRLA, EOMI  Neck: Supple, no nuchal rigidity, no palpable LAD  Pulmonary: Sym exp, good air movt, CTAB, no rales, rhonchi, & wheezing  Cardiac: irr, irr rate and rhythm, no Murmurs, rubs or gallops  Vascular: Vessel Right Left  Radial No Palpable Palpable  Brachial Palpable Palpable  Carotid Palpable, without bruit Palpable, without bruit  Aorta Not palpable N/A  Femoral Palpable Not Palpable  Popliteal Not palpable Not palpable  PT BKA Not Palpable  DP BKA Not Palpable   Gastrointestinal: soft, NTND, -G/R, - HSM, - masses, - CVAT B, healed incisions, suprapubic catheter in place  Musculoskeletal: M/S 4-5/5 BUE, RLE 3/5, LLE 1/5, R BKA viable, L lower leg appears pallorous & mottled, no warm in left thigh, cold lower leg, foot drop on left, R BKA in contracture  Neurologic: CN 2-12 intact , + pain with manipulation of L foot, otherwise anesthestic L foot, Motor exam as listed above  Psychiatric: Judgment intact, Mood & affect appropriate for pt's clinical situation  Dermatologic: See M/S exam for extremity exam, no rashes otherwise noted  Lymph : No Cervical, Axillary, or Inguinal lymphadenopathy   Laboratory: CBC:    Component Value Date/Time   WBC 9.4 07/01/2013 2003   RBC 2.44* 07/01/2013 2003   RBC 2.52* 02/08/2012  0350   HGB 7.5* 07/01/2013 2003   HCT 22.3* 07/01/2013 2003   PLT 168 07/01/2013 2003   MCV 91.4 07/01/2013 2003   MCH 30.7 07/01/2013 2003   MCHC 33.6 07/01/2013 2003   RDW 16.2* 07/01/2013 2003   LYMPHSABS 1.8 07/01/2013 2003   MONOABS 0.8 07/01/2013 2003   EOSABS 0.0 07/01/2013 2003   BASOSABS 0.0 07/01/2013 2003    BMP:    Component Value Date/Time   NA 138 07/01/2013 2003   K 3.4* 07/01/2013 2003   CL 102 07/01/2013 2003   CO2 23 07/01/2013 2003   GLUCOSE 161* 07/01/2013 2003   BUN 32* 07/01/2013 2003   CREATININE 1.14 07/01/2013 2003   CREATININE 0.96 04/03/2011 0857   CALCIUM 7.4* 07/01/2013 2003   GFRNONAA 59* 07/01/2013 2003   GFRAA 69* 07/01/2013 2003    Coagulation: Lab Results  Component Value Date   INR 1.33 07/01/2013   INR 2.1* 06/08/2012   INR 2.3 05/11/2012   No results found for this basename: PTT    ABI (06/18/11): Brachial pressures:  +--------+-----+----+---+  RightLeftMax +--------+-----+----+---+ Systolic152 478 295 +--------+-----+----+---+ Arterial pressure indices:  +-----------------+--------+--------------+----------+ Location PressureBrachial indexWaveform  +-----------------+--------+--------------+----------+ Right ant tibial ----------------------Absent  +-----------------+--------+--------------+----------+ Right post tibial120mm Hg0.74 Monophasic +-----------------+--------+--------------+----------+ Right peroneal 163mm Hg0.89 Monophasic +-----------------+--------+--------------+----------+ Right 1st toe 70mm Hg 0.39 Normal  +-----------------+--------+--------------+----------+ Left ant tibial --------0.99 Absent  +-----------------+--------+--------------+----------+ Left post tibial 134mm Hg0.99 Monophasic +-----------------+--------+--------------+----------+ Left peroneal 187mm Hg1.01 Monophasic +-----------------+--------+--------------+----------+ Left 1st toe 16mm Hg 0.39  Dampened  +-----------------+--------+--------------+----------+  ------------------------------------------------------------ Summary: Right ABI indicates midl reduction in flow. Left ABI appears normal. Bilateral ABIs may be slightly falsely elevated. Pedal waveforms abnormal. Bilateral great toe pressures indicate adequate perfusion. Left great toe waveform dampened compared to the right.   Medical Decision Making  [name] is a 80 y.o. male who  presents with: likely left femoral vs iliac arterial thromboembolism, likely dead left lower leg, hemodynamically unstable atrial fibrillation, anemia of unknown etiology, known bilateral peripheral arterial disease    Left lower leg already appears to be dead with no sensation and no motor.  This is consistent with the time course of >12 hours elapsed.  I suspected minimally a L AKA will be needed.  In order to avoid a hip dysarticulation which might not even heal if the iliac arteries are occluded, thrombectomy of the left iliofemoral segment is needed.   Obviously with the anemia, likely some type of GI bleed vs. consumptive process, and hemodynamically unstable atrial fibrillation, this patient is at higher risk for a cardiac event.  As a hip dysarticulation is associated with ~100% mortality rate, I believe the risk is justified.    I anticipate possible short-term ventilation and ICU need while his hemodynamic status is stabilized.  He will likely need to be reloaded on anticoagulation at the risk of potentiating any underlying bleeding.   The risk, benefits, and alternative for the procedure were discussed with the patient and family.   The patient and family is aware the risks include but are not limited to: bleeding, infection, myocardial infarction, stroke, limb loss, nerve damage, need for additional procedures in the future, wound complications, and inability to complete the procedure.   The patient and family is aware of  these risks and agreed to proceed.  Adele Barthel, MD [facility] [place] Office: [phone] Pager: [phone]  07/01/2013, 9:18 PM

## 2013-07-01 NOTE — ED Notes (Addendum)
Vascular surgery at bedside speaking to pt and family about plan of care.

## 2013-07-01 NOTE — Anesthesia Preprocedure Evaluation (Signed)
Anesthesia Evaluation  Patient identified by MRN, date of birth, ID band Patient awake    Reviewed: Allergy & Precautions, H&P , NPO status , Patient's Chart, lab work & pertinent test results  Airway Mallampati: I TM Distance: >3 FB Neck ROM: Full    Dental  (+) Upper Dentures, Lower Dentures and Dental Advisory Given   Pulmonary former smoker,  breath sounds clear to auscultation        Cardiovascular hypertension, Pt. on medications + CAD, + Past MI and + Peripheral Vascular Disease + dysrhythmias Atrial Fibrillation Rhythm:Irregular Rate:Normal     Neuro/Psych    GI/Hepatic   Endo/Other    Renal/GU Renal InsufficiencyRenal disease     Musculoskeletal   Abdominal   Peds  Hematology   Anesthesia Other Findings   Reproductive/Obstetrics                           Anesthesia Physical Anesthesia Plan  ASA: IV  Anesthesia Plan: General   Post-op Pain Management:    Induction: Intravenous  Airway Management Planned: Oral ETT  Additional Equipment:   Intra-op Plan:   Post-operative Plan: Extubation in OR  Informed Consent: I have reviewed the patients History and Physical, chart, labs and discussed the procedure including the risks, benefits and alternatives for the proposed anesthesia with the patient or authorized representative who has indicated his/her understanding and acceptance.   Dental advisory given  Plan Discussed with: CRNA, Anesthesiologist and Surgeon  Anesthesia Plan Comments:         Anesthesia Quick Evaluation

## 2013-07-01 NOTE — ED Provider Notes (Signed)
CSN: [id number]     Arrival date & time 07/01/13  1931 History   First MD Initiated Contact with Patient 07/01/13 1936     Chief Complaint  Patient presents with  . Leg Pain   (Consider location/radiation/quality/duration/timing/severity/associated sxs/prior Treatment) Patient is a 80 y.o. male presenting with leg pain.  Leg Pain  Pt with history of PVD s/p R BKA in 2013 also recently stopped coumadin due to anemia had sudden onset L lower leg pain. See by PCP and no pulses found. Sent to the ED for eval. He is complaining of moderate to severe aching pain in L foot, worse with palpation.   Past Medical History  Diagnosis Date  . CAD (coronary artery disease)   . Hyperlipidemia   . Diabetes mellitus   . Hypertension   . Suprapubic catheter   . Peripheral vascular disease   . Arthritis   . Myocardial infarction   . Cancer     prostate cancer  . A-fib    Past Surgical History  Procedure Laterality Date  . Prostatectomy      radiation therapy  . Bladder cath    . Laminectomy      lumbar  . Lumbar disc surgery    . 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   Marland Kitchen Umbilical hernia repair  3419'F, 7902    umbilical hernia repair in 4097D, super umbilical hernia repaired in 2006  . Coronary artery bypass graft      2008  . Total hip arthroplasty      RT  . Cataract extraction  2012    Left 03/2011, Right 04/2011  . Amputation  12/03/2011    Procedure: AMPUTATION BELOW KNEE;  Surgeon: Elam Dutch, MD;  Location: [facility] OR;  Service: Vascular;  Laterality: Right;  . Esophagogastroduodenoscopy  02/12/2012    Procedure: ESOPHAGOGASTRODUODENOSCOPY (EGD);  Surgeon: Milus Banister, MD;  Location: [facility];  Service: Endoscopy;  Laterality: N/A;  . Peg placement  02/18/2012    Procedure: PERCUTANEOUS ENDOSCOPIC GASTROSTOMY (PEG) PLACEMENT;  Surgeon: Inda Castle, MD;  Location: [place];  Service: Endoscopy;  Laterality: N/A;   Family History   Problem Relation Age of Onset  . Coronary artery disease      siblings  . Coronary artery disease Father   . Diabetes Sister   . Hyperlipidemia      family history   History  Substance Use Topics  . Smoking status: Former Smoker -- 0.25 packs/day for 10 years    Types: Cigarettes    Quit date: 06/10/1970  . Smokeless tobacco: Never Used  . Alcohol Use: No    Review of Systems All other systems reviewed and are negative except as noted in HPI.   Allergies  Levaquin; Morphine and related; Codeine; and Penicillins  Home Medications   Current Outpatient Rx  Name  Route  Sig  Dispense  Refill  . ALPRAZolam (XANAX) 0.5 MG tablet      TAKE ONE TABLET BY MOUTH AT BEDTIME AS NEEDED   30 tablet   0   . atorvastatin (LIPITOR) 40 MG tablet   Oral   Take 1 tablet (40 mg total) by mouth at bedtime.   90 tablet   3   . FLUoxetine HCl 60 MG TABS   Oral   Take 60 mg by mouth daily.   30 tablet   1   . furosemide (LASIX) 20 MG tablet  TAKE ONE TABLET BY MOUTH EVERY DAY   90 tablet   0   . HYDROcodone-acetaminophen (NORCO/VICODIN) 5-325 MG per tablet      TAKE ONE TABLET BY MOUTH EVERY 4 HOURS AS NEEDED   90 tablet   0   . metFORMIN (GLUCOPHAGE) 500 MG tablet   Oral   Take 1 tablet (500 mg total) by mouth 2 (two) times daily.   180 tablet   3   . metoprolol tartrate (LOPRESSOR) 25 MG tablet      TAKE ONE TABLET BY MOUTH TWICE DAILY   180 tablet   0   . Nutritional Supplements (FEEDING SUPPLEMENT, OSMOLITE 1.2 CAL,) LIQD   Per Tube   Place 1,000 mLs into feeding tube continuous. Place 1,000 mLs into feeding tube continuous. Start Osmolite 1.2 at rate of 70 ml/hr from 7pm-7am   1000 mL   10   . Omega-3 Fatty Acids (FISH OIL) 1200 MG CAPS   Oral   Take 1 capsule by mouth daily.         Marland Kitchen omeprazole (PRILOSEC) 20 MG capsule   Oral   Take 20 mg by mouth daily.         Marland Kitchen sulfamethoxazole-trimethoprim (SEPTRA DS) 800-160 MG per tablet   Oral   Take  1 tablet by mouth 2 (two) times daily. For 7 days.   14 tablet   0   . warfarin (COUMADIN) 3 MG tablet   Oral   Take 1 tablet (3 mg total) by mouth daily.   40 tablet   4    BP 92/56  Pulse 111  Temp(Src) 98.7 F (37.1 C) (Oral)  Resp 16  SpO2 98% Physical Exam  Nursing note and vitals reviewed. Constitutional: He is oriented to person, place, and time. He appears well-developed and well-nourished.  HENT:  Head: Normocephalic and atraumatic.  Eyes: EOM are normal. Pupils are equal, round, and reactive to light.  Neck: Normal range of motion. Neck supple.  Cardiovascular: Normal rate and normal heart sounds.   Pulmonary/Chest: Effort normal and breath sounds normal.  Abdominal: Bowel sounds are normal. He exhibits no distension. There is no tenderness.  Musculoskeletal: He exhibits tenderness.  S/p R BKA; L lower leg is mottled, cold to touch and no pulses identified with doppler  Neurological: He is alert and oriented to person, place, and time. He has normal strength. No cranial nerve deficit or sensory deficit.  Skin: Skin is warm and dry. No rash noted.  Psychiatric: He has a normal mood and affect.    ED Course  Procedures (including critical care time) Labs Review Labs Reviewed  CBC WITH DIFFERENTIAL  BASIC METABOLIC PANEL  PROTIME-INR  APTT   Imaging Review No results found.  EKG Interpretation   None       MDM   1. Peripheral vascular disease of lower extremity   2. Ischemic foot     Spoke with Dr. Bridgett Larsson, on call for Vascular Surgery who will come to the ED to evaluate the patient. Pt is currently sleeping comfortably.    Adlai Nieblas B. Karle Starch, MD 07/01/13 2003

## 2013-07-01 NOTE — Anesthesia Procedure Notes (Addendum)
Procedure Name: Intubation Date/Time: 07/01/2013 10:23 PM Performed by: Vaughan Browner Pre-anesthesia Checklist: Patient identified, Emergency Drugs available, Suction available and Patient being monitored Patient Re-evaluated:Patient Re-evaluated prior to inductionOxygen Delivery Method: Circle system utilized Preoxygenation: Pre-oxygenation with 100% oxygen Intubation Type: Rapid sequence, Cricoid Pressure applied and IV induction Laryngoscope Size: Mac and 3 Grade View: Grade I Tube type: Oral Tube size: 7.5 mm Number of attempts: 1 Airway Equipment and Method: Stylet Placement Confirmation: ETT inserted through vocal cords under direct vision,  positive ETCO2 and breath sounds checked- equal and bilateral Secured at: 22 cm Tube secured with: Tape Dental Injury: Teeth and Oropharynx as per pre-operative assessment    Anesthesia Procedure Note Anesthesia Procedure Note   CVC Placement:  Right IJ vein with needle in place.

## 2013-07-02 ENCOUNTER — Inpatient Hospital Stay (HOSPITAL_COMMUNITY): Payer: PRIVATE HEALTH INSURANCE

## 2013-07-02 ENCOUNTER — Encounter: Payer: Self-pay | Admitting: Surgery

## 2013-07-02 DIAGNOSIS — J9600 Acute respiratory failure, unspecified whether with hypoxia or hypercapnia: Secondary | ICD-10-CM

## 2013-07-02 DIAGNOSIS — I739 Peripheral vascular disease, unspecified: Secondary | ICD-10-CM

## 2013-07-02 DIAGNOSIS — I999 Unspecified disorder of circulatory system: Secondary | ICD-10-CM

## 2013-07-02 DIAGNOSIS — E440 Moderate protein-calorie malnutrition: Secondary | ICD-10-CM | POA: Insufficient documentation

## 2013-07-02 DIAGNOSIS — I4891 Unspecified atrial fibrillation: Secondary | ICD-10-CM

## 2013-07-02 DIAGNOSIS — I743 Embolism and thrombosis of arteries of the lower extremities: Secondary | ICD-10-CM

## 2013-07-02 DIAGNOSIS — I369 Nonrheumatic tricuspid valve disorder, unspecified: Secondary | ICD-10-CM

## 2013-07-02 DIAGNOSIS — D649 Anemia, unspecified: Secondary | ICD-10-CM

## 2013-07-02 DIAGNOSIS — G9340 Encephalopathy, unspecified: Secondary | ICD-10-CM

## 2013-07-02 DIAGNOSIS — I959 Hypotension, unspecified: Secondary | ICD-10-CM

## 2013-07-02 DIAGNOSIS — Z9911 Dependence on respirator [ventilator] status: Secondary | ICD-10-CM

## 2013-07-02 DIAGNOSIS — E119 Type 2 diabetes mellitus without complications: Secondary | ICD-10-CM

## 2013-07-02 LAB — POCT I-STAT 3, ART BLOOD GAS (G3+)
BICARBONATE: 24.6 meq/L — AB (ref 20.0–24.0)
O2 Saturation: 100 %
PH ART: 7.426 (ref 7.350–7.450)
PO2 ART: 511 mmHg — AB (ref 80.0–100.0)
Patient temperature: 98.6
TCO2: 26 mmol/L (ref 0–100)
pCO2 arterial: 37.4 mmHg (ref 35.0–45.0)

## 2013-07-02 LAB — BASIC METABOLIC PANEL
BUN: 27 mg/dL — ABNORMAL HIGH (ref 6–23)
BUN: 26 mg/dL — ABNORMAL HIGH (ref 6–23)
CALCIUM: 6.7 mg/dL — AB (ref 8.4–10.5)
CO2: 20 meq/L (ref 19–32)
CO2: 21 meq/L (ref 19–32)
Calcium: 6.6 mg/dL — ABNORMAL LOW (ref 8.4–10.5)
Chloride: 106 mEq/L (ref 96–112)
Chloride: 107 mEq/L (ref 96–112)
Creatinine, Ser: 1 mg/dL (ref 0.50–1.35)
Creatinine, Ser: 1 mg/dL (ref 0.50–1.35)
GFR calc Af Amer: 80 mL/min — ABNORMAL LOW (ref >90)
GFR calc Af Amer: 80 mL/min — ABNORMAL LOW (ref >90)
GFR calc non Af Amer: 69 mL/min — ABNORMAL LOW (ref >90)
GFR, EST NON AFRICAN AMERICAN: 69 mL/min — AB (ref >90)
GLUCOSE: 160 mg/dL — AB (ref 70–99)
GLUCOSE: 120 mg/dL — AB (ref 70–99)
Potassium: 3.4 mEq/L — ABNORMAL LOW (ref 3.7–5.3)
Potassium: 3.6 mEq/L — ABNORMAL LOW (ref 3.7–5.3)
SODIUM: 138 meq/L (ref 137–147)
SODIUM: 139 meq/L (ref 137–147)

## 2013-07-02 LAB — TROPONIN I
Troponin I: <0.3 ng/mL (ref <0.30)
Troponin I: <0.3 ng/mL (ref <0.30)
Troponin I: <0.3 ng/mL (ref <0.30)

## 2013-07-02 LAB — POCT I-STAT 4, (NA,K, GLUC, HGB,HCT)
Glucose, Bld: 186 mg/dL — ABNORMAL HIGH (ref 70–99)
Glucose, Bld: 200 mg/dL — ABNORMAL HIGH (ref 70–99)
HCT: 25 % — ABNORMAL LOW (ref 39.0–52.0)
HEMATOCRIT: 33 % — AB (ref 39.0–52.0)
HEMOGLOBIN: 11.2 g/dL — AB (ref 13.0–17.0)
Hemoglobin: 8.5 g/dL — ABNORMAL LOW (ref 13.0–17.0)
POTASSIUM: 4.1 meq/L (ref 3.7–5.3)
Potassium: 3.5 mEq/L — ABNORMAL LOW (ref 3.7–5.3)
SODIUM: 139 meq/L (ref 137–147)
Sodium: 140 mEq/L (ref 137–147)

## 2013-07-02 LAB — CBC
HCT: 32.7 % — ABNORMAL LOW (ref 39.0–52.0)
Hemoglobin: 11 g/dL — ABNORMAL LOW (ref 13.0–17.0)
MCH: 29.6 pg (ref 26.0–34.0)
MCHC: 33.6 g/dL (ref 30.0–36.0)
MCV: 88.1 fL (ref 78.0–100.0)
PLATELETS: 120 10*3/uL — AB (ref 150–400)
RBC: 3.71 MIL/uL — AB (ref 4.22–5.81)
RDW: 15.4 % (ref 11.5–15.5)
WBC: 11 10*3/uL — ABNORMAL HIGH (ref 4.0–10.5)

## 2013-07-02 LAB — GLUCOSE, CAPILLARY
GLUCOSE-CAPILLARY: 116 mg/dL — AB (ref 70–99)
Glucose-Capillary: 136 mg/dL — ABNORMAL HIGH (ref 70–99)
Glucose-Capillary: 128 mg/dL — ABNORMAL HIGH (ref 70–99)
Glucose-Capillary: 137 mg/dL — ABNORMAL HIGH (ref 70–99)
Glucose-Capillary: 117 mg/dL — ABNORMAL HIGH (ref 70–99)
Glucose-Capillary: 111 mg/dL — ABNORMAL HIGH (ref 70–99)
Glucose-Capillary: 92 mg/dL (ref 70–99)

## 2013-07-02 LAB — FOLATE: FOLATE: 6 ng/mL

## 2013-07-02 LAB — FERRITIN: Ferritin: 719 ng/mL — ABNORMAL HIGH (ref 22–322)

## 2013-07-02 LAB — MRSA PCR SCREENING: MRSA by PCR: POSITIVE — AB

## 2013-07-02 LAB — PROTIME-INR
INR: 1.4 (ref 0.00–1.49)
PROTHROMBIN TIME: 16.8 s — AB (ref 11.6–15.2)

## 2013-07-02 LAB — BLOOD PRODUCT ORDER (VERBAL) VERIFICATION

## 2013-07-02 LAB — RETICULOCYTES
RBC.: 3.69 MIL/uL — ABNORMAL LOW (ref 4.22–5.81)
RETIC COUNT ABSOLUTE: 48 10*3/uL (ref 19.0–186.0)
Retic Ct Pct: 1.3 % (ref 0.4–3.1)

## 2013-07-02 LAB — HEPARIN LEVEL (UNFRACTIONATED)
Heparin Unfractionated: <0.1 IU/mL — ABNORMAL LOW (ref 0.30–0.70)
Heparin Unfractionated: <0.1 IU/mL — ABNORMAL LOW (ref 0.30–0.70)

## 2013-07-02 LAB — VITAMIN B12: Vitamin B-12: 792 pg/mL (ref 211–911)

## 2013-07-02 MED ORDER — METFORMIN HCL ER 500 MG PO TB24: 500.0000 mg | ORAL_TABLET | Freq: Two times a day (BID) | ORAL | Status: DC

## 2013-07-02 MED ORDER — FENTANYL CITRATE 0.05 MG/ML IJ SOLN: 50.0000 ug | Freq: Once | INTRAMUSCULAR | Status: DC

## 2013-07-02 MED ORDER — BISACODYL 10 MG RE SUPP: 10.0000 mg | Freq: Every day | RECTAL | Status: DC | PRN

## 2013-07-02 MED ORDER — SODIUM CHLORIDE 0.9 % IV SOLN
0.0000 ug/h | INTRAVENOUS | Status: DC
  Administered 2013-07-02: 25 ug/h via INTRAVENOUS
  Filled 2013-07-02: qty 50

## 2013-07-02 MED ORDER — METOPROLOL TARTRATE 1 MG/ML IV SOLN
2.0000 mg | INTRAVENOUS | Status: DC | PRN
  Administered 2013-07-03: 2.5 mg via INTRAVENOUS

## 2013-07-02 MED ORDER — VECURONIUM BROMIDE 10 MG IV SOLR
INTRAVENOUS | Status: DC | PRN
  Administered 2013-07-02: 4 mg via INTRAVENOUS

## 2013-07-02 MED ORDER — PHENYLEPHRINE 40 MCG/ML (10ML) SYRINGE FOR IV PUSH (FOR BLOOD PRESSURE SUPPORT)
PREFILLED_SYRINGE | INTRAVENOUS | Status: AC
  Filled 2013-07-02: qty 10

## 2013-07-02 MED ORDER — ONDANSETRON HCL 4 MG/2ML IJ SOLN
4.0000 mg | Freq: Four times a day (QID) | INTRAMUSCULAR | Status: DC | PRN
  Administered 2013-07-03: 4 mg via INTRAVENOUS
  Filled 2013-07-02: qty 2

## 2013-07-02 MED ORDER — POTASSIUM CHLORIDE CRYS ER 20 MEQ PO TBCR: 20.0000 meq | EXTENDED_RELEASE_TABLET | Freq: Every day | ORAL | Status: DC | PRN

## 2013-07-02 MED ORDER — SODIUM CHLORIDE 0.9 % IV SOLN: 500.0000 mL | Freq: Once | INTRAVENOUS | Status: AC | PRN

## 2013-07-02 MED ORDER — THROMBIN 20000 UNITS EX SOLR
CUTANEOUS | Status: DC | PRN
  Administered 2013-07-02: via TOPICAL

## 2013-07-02 MED ORDER — ONDANSETRON HCL 4 MG/2ML IJ SOLN: 4.0000 mg | Freq: Once | INTRAMUSCULAR | Status: DC | PRN

## 2013-07-02 MED ORDER — SODIUM CHLORIDE 0.9 % IJ SOLN
INTRAMUSCULAR | Status: AC
  Filled 2013-07-02: qty 10

## 2013-07-02 MED ORDER — CARVEDILOL 3.125 MG PO TABS
3.1250 mg | ORAL_TABLET | Freq: Every day | ORAL | Status: DC
  Filled 2013-07-02: qty 1

## 2013-07-02 MED ORDER — LABETALOL HCL 5 MG/ML IV SOLN: 10.0000 mg | INTRAVENOUS | Status: DC | PRN

## 2013-07-02 MED ORDER — PANTOPRAZOLE SODIUM 40 MG PO TBEC: 40.0000 mg | DELAYED_RELEASE_TABLET | Freq: Two times a day (BID) | ORAL | Status: DC

## 2013-07-02 MED ORDER — MEMANTINE HCL ER 28 MG PO CP24: 28.0000 mg | ORAL_CAPSULE | Freq: Every day | ORAL | Status: DC

## 2013-07-02 MED ORDER — INSULIN ASPART 100 UNIT/ML ~~LOC~~ SOLN
0.0000 [IU] | SUBCUTANEOUS | Status: DC
  Administered 2013-07-03: 3 [IU] via SUBCUTANEOUS

## 2013-07-02 MED ORDER — HEPARIN (PORCINE) IN NACL 100-0.45 UNIT/ML-% IJ SOLN
1450.0000 [IU]/h | INTRAMUSCULAR | Status: DC
  Administered 2013-07-02: 600 [IU]/h via INTRAVENOUS
  Administered 2013-07-03: 1450 [IU]/h via INTRAVENOUS
  Administered 2013-07-03: 1000 [IU]/h via INTRAVENOUS
  Administered 2013-07-03: 1200 [IU]/h via INTRAVENOUS
  Administered 2013-07-03: 1000 [IU]/h via INTRAVENOUS
  Filled 2013-07-02 (×6): qty 250

## 2013-07-02 MED ORDER — OXYCODONE HCL 5 MG PO TABS: 5.0000 mg | ORAL_TABLET | ORAL | Status: DC | PRN

## 2013-07-02 MED ORDER — DOPAMINE-DEXTROSE 3.2-5 MG/ML-% IV SOLN: 3.0000 ug/kg/min | INTRAVENOUS | Status: DC

## 2013-07-02 MED ORDER — FENTANYL BOLUS VIA INFUSION
25.0000 ug | INTRAVENOUS | Status: DC | PRN
  Filled 2013-07-02: qty 50

## 2013-07-02 MED ORDER — ACETAMINOPHEN 325 MG PO TABS: 325.0000 mg | ORAL_TABLET | ORAL | Status: DC | PRN

## 2013-07-02 MED ORDER — ACETAMINOPHEN 650 MG RE SUPP: 325.0000 mg | RECTAL | Status: DC | PRN

## 2013-07-02 MED ORDER — PANTOPRAZOLE SODIUM 40 MG IV SOLR
40.0000 mg | INTRAVENOUS | Status: DC
  Filled 2013-07-02: qty 40

## 2013-07-02 MED ORDER — ATORVASTATIN CALCIUM 10 MG PO TABS
10.0000 mg | ORAL_TABLET | Freq: Every day | ORAL | Status: DC
  Filled 2013-07-02: qty 1

## 2013-07-02 MED ORDER — GUAIFENESIN-DM 100-10 MG/5ML PO SYRP: 15.0000 mL | ORAL_SOLUTION | ORAL | Status: DC | PRN

## 2013-07-02 MED ORDER — METOPROLOL TARTRATE 1 MG/ML IV SOLN
5.0000 mg | Freq: Four times a day (QID) | INTRAVENOUS | Status: DC
  Administered 2013-07-02 – 2013-07-04 (×7): 5 mg via INTRAVENOUS
  Filled 2013-07-02 (×14): qty 5

## 2013-07-02 MED ORDER — PHENOL 1.4 % MT LIQD: 1.0000 | OROMUCOSAL | Status: DC | PRN

## 2013-07-02 MED ORDER — SODIUM CHLORIDE 0.9 % IJ SOLN
10.0000 mL | Freq: Two times a day (BID) | INTRAMUSCULAR | Status: DC
  Administered 2013-07-02 – 2013-07-04 (×6): 10 mL
  Administered 2013-07-05: 20 mL

## 2013-07-02 MED ORDER — HYDROMORPHONE HCL PF 1 MG/ML IJ SOLN: 0.5000 mg | INTRAMUSCULAR | Status: DC | PRN

## 2013-07-02 MED ORDER — ALUM & MAG HYDROXIDE-SIMETH 200-200-20 MG/5ML PO SUSP: 15.0000 mL | ORAL | Status: DC | PRN

## 2013-07-02 MED ORDER — POLYETHYLENE GLYCOL 3350 17 G PO PACK
17.0000 g | PACK | Freq: Every day | ORAL | Status: DC | PRN
  Filled 2013-07-02: qty 1

## 2013-07-02 MED ORDER — SODIUM CHLORIDE 0.9 % IJ SOLN: 10.0000 mL | INTRAMUSCULAR | Status: DC | PRN

## 2013-07-02 MED ORDER — DOCUSATE SODIUM 100 MG PO CAPS: 100.0000 mg | ORAL_CAPSULE | Freq: Every day | ORAL | Status: DC

## 2013-07-02 MED ORDER — SODIUM CHLORIDE 0.9 % IV SOLN
INTRAVENOUS | Status: DC
  Administered 2013-07-02 – 2013-07-06 (×3): via INTRAVENOUS

## 2013-07-02 MED ORDER — OXYBUTYNIN CHLORIDE 5 MG PO TABS
5.0000 mg | ORAL_TABLET | Freq: Two times a day (BID) | ORAL | Status: DC
  Administered 2013-07-02 – 2013-07-07 (×11): 5 mg via ORAL
  Filled 2013-07-02 (×13): qty 1

## 2013-07-02 MED ORDER — PANTOPRAZOLE SODIUM 40 MG PO PACK
40.0000 mg | PACK | Freq: Every day | ORAL | Status: DC
  Administered 2013-07-02: 40 mg
  Filled 2013-07-02: qty 20

## 2013-07-02 MED ORDER — FENTANYL CITRATE 0.05 MG/ML IJ SOLN: 25.0000 ug | INTRAMUSCULAR | Status: DC | PRN

## 2013-07-02 MED ORDER — MAGNESIUM SULFATE 40 MG/ML IJ SOLN
2.0000 g | Freq: Every day | INTRAMUSCULAR | Status: DC | PRN
  Filled 2013-07-02: qty 50

## 2013-07-02 MED ORDER — ONDANSETRON HCL 4 MG/2ML IJ SOLN
INTRAMUSCULAR | Status: DC | PRN
Start: 2013-07-02 — End: 2013-07-02
  Administered 2013-07-02: 4 mg via INTRAVENOUS

## 2013-07-02 MED ORDER — SODIUM CHLORIDE 0.9 % IV SOLN
INTRAVENOUS | Status: DC
  Administered 2013-07-02 – 2013-07-03 (×2): via INTRAVENOUS
  Administered 2013-07-04: 75 mL/h via INTRAVENOUS

## 2013-07-02 MED ORDER — HYDRALAZINE HCL 20 MG/ML IJ SOLN: 10.0000 mg | INTRAMUSCULAR | Status: DC | PRN

## 2013-07-02 NOTE — Progress Notes (Signed)
  Echocardiogram 2D Echocardiogram has been performed.  [name] 07/02/2013, 5:01 PM

## 2013-07-02 NOTE — Progress Notes (Signed)
INITIAL NUTRITION ASSESSMENT  DOCUMENTATION CODES Per approved criteria  -Non-severe (moderate) malnutrition in the context of chronic illness   INTERVENTION: Advance diet as medically appropriate, add interventions accordingly RD to follow for nutrition care plan  NUTRITION DIAGNOSIS: Increased nutrient needs related to post-op healing as evidenced by estimated nutrition needs  Goal: Pt to meet >/= 90% of their estimated nutrition needs   Monitor:  PO diet advancement & intake, weight, labs, I/O's  Reason for Assessment: Low Braden  80 y.o. male  Admitting Dx: Dead left lower leg, iliofemoral thromboembolism  ASSESSMENT: Patient with PMH of CAD, DM II, HTN, PAD and prostate CA s/p prostatectomy, presened to the ED with complaints of left leg pain; admitted for iliofemoral thromboembolism.  Patient s/p procedures 1/23: LEFT ILIOFEMORAL ENDARTERECTOMY LEFT ILIOFEMORAL THROMBOEMBOLECTOMY LEFT ABOVE-THE-KNEE AMPUTATION  RD unable to obtain nutrition hx at this time; spoke with [facility] staff; patient receives Regular diet; patient used to have PEG tube, however, was removed few months ago; noted patient with Stage III pressure ulcer to sacrum; drinks Ensure supplements at SNF; RD to order when/as able.  Nutrition Focused Physical Exam:  Subcutaneous Fat:  Orbital Region: mild to moderate depletion Upper Arm Region: mild to moderate depletion  Thoracic and Lumbar Region: N/A  Muscle:  Temple Region: mild to moderate depletion Clavicle Bone Region: mild to moderate depletion  Clavicle and Acromion Bone Region: mild to moderate depletion Scapular Bone Region: N/A Dorsal Hand: N/A Patellar Region: N/A Anterior Thigh Region: N/A Posterior Calf Region: N/A  Edema: none  Patient meets criteria for non-severe (moderate) malnutrition in the context of chronic illness as evidenced by mild-moderate muscle & subcutaneous fat loss.  Height: Ht Readings from Last 1  Encounters:  07/02/13 5\' 6"  (1.676 m)    Weight: Wt Readings from Last 1 Encounters:  07/02/13 125 lb 11.2 oz (57.017 kg)    Ideal Body Weight: 128 lb -- adjusted for AKA  % Ideal Body Weight: 97%  Wt Readings from Last 10 Encounters:  07/02/13 125 lb 11.2 oz (57.017 kg)  07/02/13 125 lb 11.2 oz (57.017 kg)  02/19/12 112 lb 3.2 oz (50.894 kg)  02/19/12 112 lb 3.2 oz (50.894 kg)  02/19/12 112 lb 3.2 oz (50.894 kg)  02/19/12 112 lb 3.2 oz (50.894 kg)  01/09/12 126 lb (57.153 kg)  12/02/11 134 lb 14.7 oz (61.2 kg)  12/02/11 134 lb 14.7 oz (61.2 kg)  11/04/11 163 lb 14.4 oz (74.345 kg)    Usual Body Weight: unable to obtain  % Usual Body Weight: ---  BMI:  22.2 kg/m2 -- adjusted for AKA  Estimated Nutritional Needs: Kcal: 1800-2000 Protein: 90-100 gm Fluid: 1.8-2.0 L  Skin: Stage III pressure ulcer to sacrum  Diet Order: NPO  EDUCATION NEEDS: -No education needs identified at this time   Intake/Output Summary (Last 24 hours) at 07/02/13 1402 Last data filed at 07/02/13 1200  Gross per 24 hour  Intake 3844.94 ml  Output   1499 ml  Net 2345.94 ml    Labs:   Recent Labs Lab 07/01/13 2003  07/02/13 0043 07/02/13 0215 07/02/13 1030  NA 138  < > 139 138 139  K 3.4*  < > 4.1 3.4* 3.6*  CL 102  --   --  106 107  CO2 23  --   --  20 21  BUN 32*  --   --  27* 26*  CREATININE 1.14  --   --  1.00 1.00  CALCIUM  7.4*  --   --  6.7* 6.6*  GLUCOSE 161*  < > 200* 160* 120*  < > = values in this interval not displayed.  CBG (last 3)   Recent Labs  07/02/13 0345 07/02/13 0800 07/02/13 1249  GLUCAP 137* 117* 111*    Scheduled Meds: . fentaNYL  50 mcg Intravenous Once  . insulin aspart  0-15 Units Subcutaneous Q4H  . metoprolol  5 mg Intravenous Q6H  . oxybutynin  5 mg Oral BID  . pantoprazole (PROTONIX) IV  40 mg Intravenous Q24H  . sodium chloride  10-40 mL Intracatheter Q12H    Continuous Infusions: . sodium chloride 75 mL/hr at 07/02/13 0250  .  sodium chloride 10 mL/hr at 07/02/13 0315  . heparin 600 Units/hr (07/02/13 0320)    Past Medical History  Diagnosis Date  . CAD (coronary artery disease)   . Hyperlipidemia   . Diabetes mellitus   . Hypertension   . Suprapubic catheter   . Peripheral vascular disease   . Arthritis   . Myocardial infarction   . Cancer     prostate cancer  . A-fib     Past Surgical History  Procedure Laterality Date  . Prostatectomy      radiation therapy  . Bladder cath    . Laminectomy      lumbar  . Lumbar disc surgery    . 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   Marland Kitchen Umbilical hernia repair  [id number], [id number]    umbilical hernia repair in [id number], super umbilical hernia repaired in 2006  . Coronary artery bypass graft      2008  . Total hip arthroplasty      RT  . Cataract extraction  2012    Left 03/2011, Right 04/2011  . Amputation  12/03/2011    Procedure: AMPUTATION BELOW KNEE;  Surgeon: Elam Dutch, MD;  Location: [facility] OR;  Service: Vascular;  Laterality: Right;  . Esophagogastroduodenoscopy  02/12/2012    Procedure: ESOPHAGOGASTRODUODENOSCOPY (EGD);  Surgeon: Milus Banister, MD;  Location: [facility];  Service: Endoscopy;  Laterality: N/A;  . Peg placement  02/18/2012    Procedure: PERCUTANEOUS ENDOSCOPIC GASTROSTOMY (PEG) PLACEMENT;  Surgeon: Inda Castle, MD;  Location: [facility];  Service: Endoscopy;  Laterality: N/A;    Arthur Holms, RD, LDN Pager #: [phone] After-Hours Pager #: [phone]

## 2013-07-02 NOTE — Consult Note (Addendum)
PULMONARY / CRITICAL CARE MEDICINE  Name: [name] MRN: [id number] DOB: 08-01-1931    ADMISSION DATE:  07/01/2013 CONSULTATION DATE:  07/02/12  REFERRING MD :  Vascular  CHIEF COMPLAINT:  Ventilator management  BRIEF PATIENT DESCRIPTION: 80 yo with CAD, AF, HLD, DM II, HTN, PAD, and prostate CA s/p prostatectomy, presened to the ED with complaints of left leg pain, admitted for iliofemoral thromboembolism, now s/p AKA.  SIGNIFICANT EVENTS / STUDIES:  1/23  OR >>> Left AKA  LINES / TUBES: OETT 1/23 >>> 1/23 LIJ CVL 1/23 >>> L radial a-line 1/23 >>> Suprapubic catheter >>>  CULTURES:  ANTIBIOTICS: Vancomycin 1/23 x 1  SUBJECTIVE:  Tolerating SBT.  Denies chest pain.  VITAL SIGNS: Temp:  [98.1 F (36.7 C)-98.7 F (37.1 C)] 98.7 F (37.1 C) (01/23 0803) Pulse Rate:  [89-135] 102 (01/23 0900) Resp:  [10-26] 10 (01/23 0900) BP: (85-122)/(56-89) 100/61 mmHg (01/23 0900) SpO2:  [98 %-100 %] 99 % (01/23 0900) Arterial Line BP: (93-148)/(53-94) 97/53 mmHg (01/23 0900) FiO2 (%):  [40 %-100 %] 40 % (01/23 0848) Weight:  [125 lb 11.2 oz (57.017 kg)] 125 lb 11.2 oz (57.017 kg) (01/23 0200)  VENTILATOR SETTINGS: Vent Mode:  [-] CPAP;PSV FiO2 (%):  [40 %-100 %] 40 % Set Rate:  [15 bmp] 15 bmp PEEP:  [5 cmH20] 5 cmH20 Pressure Support:  [5 cmH20] 5 cmH20 Plateau Pressure:  [16 cmH20] 16 cmH20  INTAKE / OUTPUT: Intake/Output     01/22 0701 - 01/23 0700 01/23 0701 - 01/24 0700   I.V. (mL/kg) 1784.3 (31.3) 187 (3.3)   Blood 1300    NG/GT 50 0   IV Piggyback 250    Total Intake(mL/kg) 3384.3 (59.4) 187 (3.3)   Urine (mL/kg/hr) 560 30 (0.2)   Drains 24    Blood 775    Total Output 1359 30   Net +2025.3 +157        Stool Occurrence 1 x      PHYSICAL EXAMINATION: General: no distress Neuro: alert, follows commands HEENT: ETT in place Cardiovascular: irregular Lungs:  No wheeze Abdomen:  Soft, non-distended. BS +. Suprapubic catheter in place, no erythema or  discharge. Musculoskeletal:  RLE 53 (old), LLE AKA (new). Drain in place.  Skin:  UE ecchymoses. No rashes, or erythema.  LABS:  CBC  Recent Labs Lab 07/01/13 2003 07/01/13 2328 07/02/13 0043 07/02/13 0215  WBC 9.4  --   --  11.0*  HGB 7.5* 8.5* 11.2* 11.0*  HCT 22.3* 25.0* 33.0* 32.7*  PLT 168  --   --  120*   Coag's  Recent Labs Lab 07/01/13 2003 07/02/13 0215  APTT 42*  --   INR 1.33 1.40   BMET  Recent Labs Lab 07/01/13 2003 07/01/13 2328 07/02/13 0043 07/02/13 0215  NA 138 140 139 138  K 3.4* 3.5* 4.1 3.4*  CL 102  --   --  106  CO2 23  --   --  20  BUN 32*  --   --  27*  CREATININE 1.14  --   --  1.00  GLUCOSE 161* 186* 200* 160*   Electrolytes  Recent Labs Lab 07/01/13 2003 07/02/13 0215  CALCIUM 7.4* 6.7*   ABG  Recent Labs Lab 07/02/13 0208  PHART 7.426  PCO2ART 37.4  PO2ART 511.0*   Cardiac Enzymes  Recent Labs Lab 07/02/13 0215 07/02/13 0805  TROPONINI <0.30 <0.30   Glucose  Recent Labs Lab 07/02/13 0345 07/02/13 0800  GLUCAP 137* 117*  CXR:  Dg Chest Port 1 View  07/02/2013   CLINICAL DATA:  Endotracheal tube placement and left IJ line placement.  EXAM: PORTABLE CHEST - 1 VIEW  COMPARISON:  Chest radiograph performed 02/13/2012  FINDINGS: The patient's endotracheal tube is seen ending 3-4 cm above the carina. A left IJ line is seen ending about the mid SVC. An enteric tube is noted extending below the diaphragm.  The lungs are mildly hypoexpanded. Mild right-sided airspace opacity may reflect atelectasis or possibly mild pneumonia. The left lung appears clear. No pneumothorax is identified.  The cardiomediastinal silhouette is borderline normal in size. The patient is status post median sternotomy, with evidence of prior CABG. No acute osseous abnormalities are seen.  IMPRESSION: 1. Endotracheal tube seen ending 3-4 cm above the carina. 2. Left IJ line seen ending about the mid SVC. 3. Lungs mildly hypoexpanded. Mild  right-sided airspace opacity may reflect atelectasis or possibly mild pneumonia.   Electronically Signed   By: Garald Balding M.D.   On: 07/02/2013 02:38     ASSESSMENT / PLAN:  PULMONARY A: Post-op ventilator dependence. P:   -proceed with extubation 1/23 -bronchial hygiene -oxygen to keep SpO2 > 92% -f/u CXR as needed  CARDIOVASCULAR A:  Hypotension in setting of general anesthesia, positive pressure ventilation and sedation - resolved Left iliofemoral thrombus s/p thrombectomy S/p L AKA  H/o R BKA H/o CAD, HTN, AF, PVD P:  -per VVS and cardiology  RENAL A:   Hypokalemia S/p prostatectomy for prostate CA Chronic suprapubic catheter P:   -monitor renal fx, urine outpt, electrolytes  GASTROINTESTINAL A:   GI Px Nutrition P:   -continue protonix for now -advance diet per VVS  HEMATOLOGIC A:   Anemia. P:  -f/u CBC -heparin gtt per VVS and cardiology  INFECTIOUS A:   No active issues P:   -Monitor clinically  ENDOCRINE A:   DM type II   P:   -SSI -Hold Metformin  NEUROLOGIC A:   Post-op pain control. H/o Dementia. P:   -prn fentanyl -resume namenda when able to take pills  CC time 35 minutes.  Chesley Mires, MD [facility] Pulmonary/Critical Care 07/02/2013, 10:31 AM Pager:  [phone] After 3pm call: [phone]

## 2013-07-02 NOTE — Evaluation (Signed)
Physical Therapy Evaluation Patient Details Name: [name] MRN: [id number] DOB: [birth year] Today's Date: 07/02/2013 Time: 1450-1520 PT Time Calculation (min): 30 min  PT Assessment / Plan / Recommendation History of Present Illness  pt admitted with left leg pain and ischemia, s/p L iliofemoral endarterectomy, thromboemboectomy and L AKA.  Clinical Impression  Pt admitted with L Lef pain s/p BPGing and L AKA.  Pt currently with functional limitations due to the deficits listed below (see PT Problem List).  Will trial pt in PT to see if he will participate and benefit.      PT Assessment  Patient needs continued PT services    Follow Up Recommendations  SNF;Other (comment) (trial)    Does the patient have the potential to tolerate intense rehabilitation      Barriers to Discharge        Equipment Recommendations  None recommended by PT    Recommendations for Other Services     Frequency Min 2X/week (trial to see if can participate)    Precautions / Restrictions Precautions Precautions: Fall   Pertinent Vitals/Pain Vitals stable       Mobility  Bed Mobility Overal bed mobility: Needs Assistance;+ 2 for safety/equipment;+2 for physical assistance Bed Mobility: Rolling;Supine to Sit Rolling: Max assist;+2 for physical assistance Supine to sit: Total assist;+2 for physical assistance General bed mobility comments: cues for guidance and truncal assist; sitting up aborted due to pt stiff and almost resistive. Transfers General transfer comment: lift pad left under pt for nursing to use to get to chair.    Exercises General Exercises - Lower Extremity Hip ABduction/ADduction: AAROM;PROM;Both;5 reps;Supine Straight Leg Raises: AROM;PROM;Both;5 reps   PT Diagnosis: Generalized weakness  PT Problem List: Decreased strength;Decreased range of motion;Decreased activity tolerance;Decreased balance;Decreased mobility;Decreased coordination;Decreased safety  awareness;Decreased knowledge of precautions;Pain PT Treatment Interventions: Functional mobility training;Therapeutic activities;Balance training;Patient/family education     PT Goals(Current goals can be found in the care plan section) Acute Rehab PT Goals Patient Stated Goal: pt did not participate PT Goal Formulation: Patient unable to participate in goal setting Time For Goal Achievement: 07/16/13 Potential to Achieve Goals: Fair  Visit Information  Last PT Received On: 07/02/13 Assistance Needed: +2 History of Present Illness: pt admitted with left leg pain and ischemia, s/p L iliofemoral endarterectomy, thromboemboectomy and L AKA.       Prior [place] expects to be discharged to:: Skilled nursing facility Prior Function Level of Independence: Needs assistance Gait / Transfers Assistance Needed: not applicable ADL's / Homemaking Assistance Needed: dependent in adls Comments: lifted to w/c where he w/c himself around independently Communication Communication: No difficulties    Cognition  Cognition Arousal/Alertness: Awake/alert Behavior During Therapy: Flat affect Overall Cognitive Status: History of cognitive impairments - at baseline Memory: Decreased short-term memory    Extremity/Trunk Assessment Upper Extremity Assessment Upper Extremity Assessment: Generalized weakness (decr coordination) Lower Extremity Assessment Lower Extremity Assessment:  (R knee contracture and general weakness, L gen. weak)   Balance General Comments General comments (skin integrity, edema, etc.): stage 3 sacral wound  End of Session PT - End of Session Activity Tolerance:  (self limiting by fears) Patient left: in bed;with call bell/phone within reach Nurse Communication: Mobility status;Need for lift equipment  GP     Laquiesha Piacente, Tessie Fass 07/02/2013, 4:18 PM 07/02/2013  Donnella Sham, [place] [phone]  (pager)

## 2013-07-02 NOTE — Consult Note (Signed)
PULMONARY / CRITICAL CARE MEDICINE  Name: LESEAN KENIMER MRN: [id number] DOB: 1931/08/15    ADMISSION DATE:  07/01/2013 CONSULTATION DATE:  07/02/12  REFERRING MD :  Vascular PRIMARY SERVICE: Vascular  CHIEF COMPLAINT:  Ventilator management  BRIEF PATIENT DESCRIPTION: 80 yo with CAD, AF, HLD, DM II, HTN, PAD, and prostate CA s/p prostatectomy, presened to the ED with complaints of left leg pain, admitted for iliofemoral thromboembolism, now s/p AKA.  SIGNIFICANT EVENTS / STUDIES:  1/23  OR >>> Left AKA  LINES / TUBES: OETT 1/23 >>> OGT 1/23 >>> LIJ CVL 1/23 >>> L radial a-line 1/23 >>> Suprapubic catheter >>>  CULTURES:  ANTIBIOTICS: Vancomycin 1/23 x 1  HISTORY OF PRESENT ILLNESS:  Mr. ARSHAN VILLAREAL is a 80 y.o. male w/ PMHx of CAD, A-fib, HLD, DM type II, HTN, PAD, and prostate CA s/p prostatectomy, presened to the ED w/ complaints of left leg pain. This morning, patient's LLE was noted to be painful and pale, pain worse w/ movement of the leg. Patient also noted to have lost motor and sensory function in the limb. Patient has long h/o A-fib, previously on Coumadin, but was recently taken off 2/2 anemia of unknown etiology.   PAST MEDICAL HISTORY :  Past Medical History  Diagnosis Date  . CAD (coronary artery disease)   . Hyperlipidemia   . Diabetes mellitus   . Hypertension   . Suprapubic catheter   . Peripheral vascular disease   . Arthritis   . Myocardial infarction   . Cancer     prostate cancer  . A-fib    Past Surgical History  Procedure Laterality Date  . Prostatectomy      radiation therapy  . Bladder cath    . Laminectomy      lumbar  . Lumbar disc surgery    . 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   Marland Kitchen Umbilical hernia repair  [id number], [id number]    umbilical hernia repair in [id number], super umbilical hernia repaired in 2006  . Coronary artery bypass graft      2008  . Total hip arthroplasty      RT  . Cataract  extraction  2012    Left 03/2011, Right 04/2011  . Amputation  12/03/2011    Procedure: AMPUTATION BELOW KNEE;  Surgeon: Elam Dutch, MD;  Location: [facility] OR;  Service: Vascular;  Laterality: Right;  . Esophagogastroduodenoscopy  02/12/2012    Procedure: ESOPHAGOGASTRODUODENOSCOPY (EGD);  Surgeon: Milus Banister, MD;  Location: [facility];  Service: Endoscopy;  Laterality: N/A;  . Peg placement  02/18/2012    Procedure: PERCUTANEOUS ENDOSCOPIC GASTROSTOMY (PEG) PLACEMENT;  Surgeon: Inda Castle, MD;  Location: [facility];  Service: Endoscopy;  Laterality: N/A;   Prior to Admission medications   Medication Sig Start Date End Date Taking? Authorizing Provider  acetaminophen (TYLENOL) 650 MG CR tablet Take 650 mg by mouth every 4 (four) hours as needed for pain.   Yes Historical Provider, MD  Amino Acids-Protein Hydrolys (FEEDING SUPPLEMENT, PRO-STAT SUGAR FREE 64,) LIQD Take 30 mLs by mouth 2 (two) times daily.   Yes Historical Provider, MD  atorvastatin (LIPITOR) 10 MG tablet Take 10 mg by mouth at bedtime.   Yes Historical Provider, MD  carvedilol (COREG) 3.125 MG tablet Take 3.125 mg by mouth daily.   Yes Historical Provider, MD  cholestyramine light (PREVALITE) 4 GM/DOSE powder Take 4 g by mouth daily as needed (for loose  stool).   Yes Historical Provider, MD  docusate sodium (COLACE) 50 MG capsule Take 50 mg by mouth daily as needed for mild constipation.   Yes Historical Provider, MD  enoxaparin (LOVENOX) 60 MG/0.6ML injection Inject 60 mg into the skin every 12 (twelve) hours.   Yes Historical Provider, MD  Ferrous Sulfate (IRON) 142 (45 FE) MG TBCR Take 142 mg by mouth daily.   Yes Historical Provider, MD  magnesium oxide (MAG-OX) 400 MG tablet Take 400 mg by mouth daily.   Yes Historical Provider, MD  Memantine HCl ER (NAMENDA XR) 28 MG CP24 Take 28 mg by mouth daily.   Yes Historical Provider, MD  metFORMIN (GLUCOPHAGE-XR) 500 MG 24 hr tablet Take 500 mg by mouth 2 (two) times  daily.   Yes Historical Provider, MD  Multiple Vitamins-Minerals (MULTIVITAMIN PO) Take 1 tablet by mouth daily.   Yes Historical Provider, MD  ondansetron (ZOFRAN) 4 MG tablet Take 4 mg by mouth every 4 (four) hours as needed for nausea or vomiting.   Yes Historical Provider, MD  oxybutynin (DITROPAN) 5 MG tablet Take 5 mg by mouth 2 (two) times daily.   Yes Historical Provider, MD  pantoprazole (PROTONIX) 40 MG tablet Take 40 mg by mouth 2 (two) times daily.   Yes Historical Provider, MD  polyethylene glycol (MIRALAX / GLYCOLAX) packet Take 17 g by mouth daily as needed for mild constipation.   Yes Historical Provider, MD  Vitamin D, Ergocalciferol, (DRISDOL) 50000 UNITS CAPS capsule Take 50,000 Units by mouth every 30 (thirty) days. *takes on the 15th of every month*   Yes Historical Provider, MD   Allergies  Allergen Reactions  . Levaquin [Levofloxacin] Rash  . Morphine And Related Other (See Comments)    Confusion, agitation  . Codeine Nausea And Vomiting    Noted on MAR  . Penicillins Rash    Noted on MAR   FAMILY HISTORY:  Family History  Problem Relation Age of Onset  . Coronary artery disease      siblings  . Coronary artery disease Father   . Diabetes Sister   . Hyperlipidemia      family history   SOCIAL HISTORY:  reports that he quit smoking about 43 years ago. His smoking use included Cigarettes. He has a 2.5 pack-year smoking history. He has never used smokeless tobacco. He reports that he does not drink alcohol or use illicit drugs.  REVIEW OF SYSTEMS:  Unable to provide.  SUBJECTIVE:   VITAL SIGNS: Temp:  [98.7 F (37.1 C)] 98.7 F (37.1 C) (01/22 1932) Pulse Rate:  [104-111] 104 (01/22 2130) Resp:  [11-16] 14 (01/22 2130) BP: (87-92)/(56-66) 87/66 mmHg (01/22 2130) SpO2:  [98 %-100 %] 100 % (01/22 2130) FiO2 (%):  [100 %] 100 % (01/23 0153)  HEMODYNAMICS:   VENTILATOR SETTINGS: Vent Mode:  [-] PRVC FiO2 (%):  [100 %] 100 % Set Rate:  [15 bmp] 15  bmp PEEP:  [5 cmH20] 5 cmH20 Plateau Pressure:  [16 cmH20] 16 cmH20  INTAKE / OUTPUT: Intake/Output     01/22 0701 - 01/23 0700   I.V. 1400   Blood 1300   IV Piggyback 250   Total Intake 2950   Urine 500   Drains 12   Blood 775   Total Output 1287   Net +1663         PHYSICAL EXAMINATION: General:  Elderly, ill appearing man, cachectic, NAD. Sedated, on ventilator Neuro:  Sedated on vent, RASS -3.  HEENT:  PERRL. Neck supple, no JVD, no carotid bruit. LIJ in place. ETT/OGT.  Cardiovascular: Tachycardic, irregularly irregular, no murmurs, gallops, rubs Lungs:  Air entry clear and equal bilaterally. No wheezes, crackles, or rales.  Abdomen:  Soft, non-distended. BS +. Suprapubic catheter in place, no erythema or discharge. Musculoskeletal:  RLE 42 (old), LLE AKA (new). Drain in place.  Skin:  UE ecchymoses. No rashes, or erythema.  LABS:  CBC  Recent Labs Lab 07/01/13 2003 07/01/13 2328 07/02/13 0043 07/02/13 0215  WBC 9.4  --   --  11.0*  HGB 7.5* 8.5* 11.2* 11.0*  HCT 22.3* 25.0* 33.0* 32.7*  PLT 168  --   --  120*   Coag's  Recent Labs Lab 07/01/13 2003 07/02/13 0215  APTT 42*  --   INR 1.33 1.40   BMET  Recent Labs Lab 07/01/13 2003 07/01/13 2328 07/02/13 0043 07/02/13 0215  NA 138 140 139 138  K 3.4* 3.5* 4.1 3.4*  CL 102  --   --  106  CO2 23  --   --  20  BUN 32*  --   --  27*  CREATININE 1.14  --   --  1.00  GLUCOSE 161* 186* 200* 160*   Electrolytes  Recent Labs Lab 07/01/13 2003 07/02/13 0215  CALCIUM 7.4* 6.7*   Sepsis Markers No results found for this basename: LATICACIDVEN, PROCALCITON, O2SATVEN,  in the last 168 hours ABG  Recent Labs Lab 07/02/13 0208  PHART 7.426  PCO2ART 37.4  PO2ART 511.0*   Liver Enzymes No results found for this basename: AST, ALT, ALKPHOS, BILITOT, ALBUMIN,  in the last 168 hours Cardiac Enzymes  Recent Labs Lab 07/02/13 0215  TROPONINI <0.30   Glucose No results found for this  basename: GLUCAP,  in the last 168 hours  CXR: 1/23 >>> Hardware in good position, no overt airspace disease  ASSESSMENT / PLAN:  PULMONARY A: Acute respiratory failure P:   Goal pH>7.30, SpO2>92 Continuous mechanical support VAP bundle Daily SBT Trend ABG/CXR  CARDIOVASCULAR A:  Hypotension in setting of general anesthesia, positive pressure ventilation and sedation - resolved Left iliofemoral thrombus s/p thrombectomy S/p L AKA  H/o R BKA H/o CAD, HTN, AF, PVD P:  Goal MAP>60 EKG  Troponin x 3 Lopressor scheduled and PRN Hydralazine PRN Hold Lipitor, Coreg  RENAL A:   Hypokalemia S/p prostatectomy for prostate CA Chronic suprapubic catheter P:   Trend BMP  Replace K Ditropan NS @ 75 ml/hr  GASTROINTESTINAL A:   GI Px Nutrition P:   NPO Protonix  TF if not extubated in AM  HEMATOLOGIC A:   Chronic anemia Anemia of acute blood loss Anticoagulation for iliofemoral thrombus P:  Trend CBC Stool for occult blood Continue Heparin gtt per Vascular for iliofemoral thrombus The need for chronic anticoagulation for AF is to be determined  INFECTIOUS A:   No active issues P:   Monitor clinically  ENDOCRINE A:   DM type II   P:   ISS CBG Hold Metformin  NEUROLOGIC A:   Acute encephalopathy H/o Dementia P:   Goal RASS 0 to -1 Fentanyl gtt Fentanyl PRN Hold Namenda for now  Luanne Bras, MD 07/02/2013 2:57 AM  I have personally obtained history, examined patient, evaluated and interpreted laboratory and imaging results, reviewed medical records, formulated assessment / plan and placed orders.  CRITICAL CARE:  The patient is critically ill with multiple organ systems failure and requires high complexity decision making for assessment and support,  frequent evaluation and titration of therapies, application of advanced monitoring technologies and extensive interpretation of multiple databases. Critical Care Time devoted to patient care  services described in this note is 35 minutes.   Doree Fudge, MD Pulmonary and [place] Pager: [phone]  07/02/2013, 6:20 AM

## 2013-07-02 NOTE — Progress Notes (Signed)
Utilization review completed. Cullin Dishman, RN, BSN. 

## 2013-07-02 NOTE — Consult Note (Addendum)
CARDIOLOGY CONSULT NOTE   Patient ID: [name] MRN: [id number], DOB/AGE: 78-Jun-1933   Admit date: 07/01/2013 Date of Consult: 07/02/2013   Primary Physician: Beatrice Lecher, MD Primary Cardiologist: Dr. Percival Spanish in September 2013  Pt. Profile  80 year old gentleman brought to the emergency room on 07/01/13 with ischemic left leg.  Problem List  Past Medical History  Diagnosis Date  . CAD (coronary artery disease)   . Hyperlipidemia   . Diabetes mellitus   . Hypertension   . Suprapubic catheter   . Peripheral vascular disease   . Arthritis   . Myocardial infarction   . Cancer     prostate cancer  . A-fib     Past Surgical History  Procedure Laterality Date  . Prostatectomy      radiation therapy  . Bladder cath    . Laminectomy      lumbar  . Lumbar disc surgery    . 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   Marland Kitchen Umbilical hernia repair  [id number], [id number]    umbilical hernia repair in [id number], super umbilical hernia repaired in 2006  . Coronary artery bypass graft      2008  . Total hip arthroplasty      RT  . Cataract extraction  2012    Left 03/2011, Right 04/2011  . Amputation  12/03/2011    Procedure: AMPUTATION BELOW KNEE;  Surgeon: Elam Dutch, MD;  Location: [facility] OR;  Service: Vascular;  Laterality: Right;  . Esophagogastroduodenoscopy  02/12/2012    Procedure: ESOPHAGOGASTRODUODENOSCOPY (EGD);  Surgeon: Milus Banister, MD;  Location: [place];  Service: Endoscopy;  Laterality: N/A;  . Peg placement  02/18/2012    Procedure: PERCUTANEOUS ENDOSCOPIC GASTROSTOMY (PEG) PLACEMENT;  Surgeon: Inda Castle, MD;  Location: [facility];  Service: Endoscopy;  Laterality: N/A;     Allergies  Allergies  Allergen Reactions  . Levaquin [Levofloxacin] Rash  . Morphine And Related Other (See Comments)    Confusion, agitation  . Codeine Nausea And Vomiting    Noted on MAR  . Penicillins Rash    Noted on MAR    HPI    This 80 year old gentleman has a history of permanent atrial fibrillation.  He had been on long-term Coumadin anticoagulation until 3 days prior to admission when his Coumadin was stopped because of slowly declining hemoglobin level.  His INR on admission was 1.33.  Prior to stopping his Coumadin he had been getting his INR checked monthly and most recently on 06/08/12 his INR was 2.1 and had also been therapeutic for the 2 months prior to that.  Yesterday the patient developed  severe left leg pain and was found to have severe ischemic left lower extremity requiring of birth knee amputation.  His hemoglobin on admission was 7.5. he has received 4 units of packed cells since admission and his hemoglobin is now 11.0.  The patient is currently on a ventilator but is alert.  He denies any chest pain or increased dyspnea recently. He has a past history of coronary artery bypass graft surgery in 2008.  He has not been having any recurrent angina pectoris. He has a history of permanent atrial fibrillation.  His last echocardiogram on 10/30/11 demonstrated the following: - Left ventricle: The cavity size was normal. Wall thickness was increased in a pattern of mild LVH. Systolic function was normal. The estimated ejection fraction was in the range of 55% to 60%. Wall motion was  normal; there were no regional wall motion abnormalities. - Aortic valve: Trivial regurgitation. - Mitral valve: Calcified annulus. - Left atrium: The atrium was moderately dilated. - Right ventricle: The cavity size was mildly dilated. - Right atrium: The atrium was moderately dilated. - Tricuspid valve: Mild-moderate regurgitation. - Pulmonary arteries: Systolic pressure was mildly increased. PA peak pressure: 71mm Hg (S). He apparently has not had any recent GI workup regarding his anemia.  Review of chart reveals that he had a PEG tube placed by Dr. Deatra Ina on 02/18/12 but he no longer has a PEG tube in place. Inpatient  Medications  . fentaNYL  50 mcg Intravenous Once  . insulin aspart  0-15 Units Subcutaneous Q4H  . metoprolol  5 mg Intravenous Q6H  . oxybutynin  5 mg Oral BID  . pantoprazole  40 mg Oral BID    Family History Family History  Problem Relation Age of Onset  . Coronary artery disease      siblings  . Coronary artery disease Father   . Diabetes Sister   . Hyperlipidemia      family history     Social History History   Social History  . Marital Status: Married    Spouse Name: Tandy Gaw    Number of Children: N/A  . Years of Education: N/A   Occupational History  . Retired Librarian, academic for [employer].     Social History Main Topics  . Smoking status: Former Smoker -- 0.25 packs/day for 10 years    Types: Cigarettes    Quit date: 06/10/1970  . Smokeless tobacco: Never Used  . Alcohol Use: No  . Drug Use: No  . Sexual Activity: Not Currently   Other Topics Concern  . 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 No caffeine.       Review of Systems  General:  No chills, fever, night sweats or weight changes.  Cardiovascular:  No chest pain, dyspnea on exertion, edema, orthopnea, palpitations, paroxysmal nocturnal dyspnea.  He has had a prior right BKA Dermatological: No rash, lesions/masses Respiratory: No cough, dyspnea Urologic: He has had prior radiation therapy for prostate cancer.  He has a chronic suprapubic catheter in place Abdominal:   No nausea, vomiting, diarrhea, bright red blood per rectum, melena, or hematemesis Neurologic:  No visual changes, wkns, changes in mental status. All other systems reviewed and are otherwise negative except as noted above.  Physical Exam  Blood pressure 101/69, pulse 100, temperature 98.7 F (37.1 C), temperature source Oral, resp. rate 16, height 5\' 6"  (1.676 m), weight 125 lb 11.2 oz (57.017 kg), SpO2 100.00%.  General: Pleasant, NAD.  The patient is intubated but alert  on the vent Psych: Normal affect. Neuro: Alert and oriented X 3. Moves all extremities spontaneously. HEENT: Normal  Neck: Supple without bruits or JVD. Lungs:  Resp regular and unlabored, CTA. Heart: Pulse is irregularly irregular.  No murmur gallop rub or click. Abdomen: Soft, non-tender, non-distended, BS + x 4.  Suprapubic catheter in place. Extremities: Prior Right BKA.  New left AKA Labs   Recent Labs  07/02/13 0215  TROPONINI <0.30   Lab Results  Component Value Date   WBC 11.0* 07/02/2013   HGB 11.0* 07/02/2013   HCT 32.7* 07/02/2013   MCV 88.1 07/02/2013   PLT 120* 07/02/2013     Recent Labs Lab 07/02/13 0215  NA 138  K 3.4*  CL 106  CO2  20  BUN 27*  CREATININE 1.00  CALCIUM 6.7*  GLUCOSE 160*   Lab Results  Component Value Date   CHOL 121 04/03/2011   HDL 30* 04/03/2011   LDLCALC 41 04/03/2011   TRIG 250* 04/03/2011   No results found for this basename: DDIMER    Radiology/Studies  Dg Chest Port 1 View  07/02/2013   CLINICAL DATA:  Endotracheal tube placement and left IJ line placement.  EXAM: PORTABLE CHEST - 1 VIEW  COMPARISON:  Chest radiograph performed 02/13/2012  FINDINGS: The patient's endotracheal tube is seen ending 3-4 cm above the carina. A left IJ line is seen ending about the mid SVC. An enteric tube is noted extending below the diaphragm.  The lungs are mildly hypoexpanded. Mild right-sided airspace opacity may reflect atelectasis or possibly mild pneumonia. The left lung appears clear. No pneumothorax is identified.  The cardiomediastinal silhouette is borderline normal in size. The patient is status post median sternotomy, with evidence of prior CABG. No acute osseous abnormalities are seen.  IMPRESSION: 1. Endotracheal tube seen ending 3-4 cm above the carina. 2. Left IJ line seen ending about the mid SVC. 3. Lungs mildly hypoexpanded. Mild right-sided airspace opacity may reflect atelectasis or possibly mild pneumonia.   Electronically Signed    By: Garald Balding M.D.   On: 07/02/2013 02:38    ECG  EKG shows atrial fibrillation with rapid ventricular response.  There is bifascicular block.  There are widespread ST-T wave abnormalities which are increased from prior tracing in may be related to a faster heart rate  ASSESSMENT AND PLAN 1.  Chronic atrial fibrillation.  Recently discontinued Coumadin because of worsening anemia.  INR was subtherapeutic on arrival and the patient presented with a presumed embolic occlusion of his left leg secondary to his atrial fibrillation. 2. anemia uncertain cause. 3. ischemic heart disease status post CABG in 2008.  No evidence of any recent ischemic symptoms and his initial troponin is negative despite the stress of his anemia and his surgery. 4. diabetes mellitus, metformin on hold. 5.  Dementia   Plan: We will continue to use low dose IV metoprolol for heart rate control.  We will update his echocardiogram.  He is presently on IV heparin.  According to the recent telephone notes in Epic he was in the process of transferring his care to a home health care physician group who would make home visits and could also follow his Coumadin there at the home.  He could also be considered for a non-oral anticoagulant agent.  However we will need to see what his hemoglobin does.  He may need a GI workup if we find that his stools are positive.  If he has ongoing blood loss he would not be a good candidate for long-term anticoagulation even though he needs it for his heart and his chronic atrial fibrillation.  I will order an anemia panel. Will follow with you.  Signed, Darlin Coco, MD 07/02/2013, 8:38 AM

## 2013-07-02 NOTE — Progress Notes (Signed)
ANTICOAGULATION CONSULT NOTE - Initial Consult  Pharmacy Consult for Heparin  Indication: s/p left iliofemoral embolectomy with L-AKA, afib  Allergies  Allergen Reactions  . Levaquin [Levofloxacin] Rash  . Morphine And Related Other (See Comments)    Confusion, agitation  . Codeine Nausea And Vomiting    Noted on MAR  . Penicillins Rash    Noted on [facility]    Patient Measurements: Height: 5\' 6"  (167.6 cm) IBW/kg (Calculated) : 63.8 50.9kg  Vital Signs: Temp: 98.7 F (37.1 C) (01/22 1932) Temp src: Oral (01/22 1932) BP: 87/66 mmHg (01/22 2130) Pulse Rate: 104 (01/22 2130)  Labs:  Recent Labs  07/01/13 2003 07/01/13 2328 07/02/13 0043  HGB 7.5* 8.5* 11.2*  HCT 22.3* 25.0* 33.0*  PLT 168  --   --   APTT 42*  --   --   LABPROT 16.2*  --   --   INR 1.33  --   --   CREATININE 1.14  --   --     Medical History: Past Medical History  Diagnosis Date  . CAD (coronary artery disease)   . Hyperlipidemia   . Diabetes mellitus   . Hypertension   . Suprapubic catheter   . Peripheral vascular disease   . Arthritis   . Myocardial infarction   . Cancer     prostate cancer  . A-fib    Assessment: 80 y/o M s/p left iliofemoral embolectomy with L-AKA, h/o afib, to start heparin per pharmacy. Lovenox PTA, last dose likely 1/22 in the AM, labs as above.   Goal of Therapy:  Heparin level 0.3-0.7 units/ml Monitor platelets by anticoagulation protocol: Yes   Plan:  -Start heparin drip at 600 units/hr (NO BOLUS) -8 hour HL at 1030 -Daily CBC/HL -Monitor for bleeding  Ryott, Rafferty 07/02/2013,2:14 AM

## 2013-07-02 NOTE — Transfer of Care (Signed)
Immediate Anesthesia Transfer of Care Note  Patient: [name]  Procedure(s) Performed: Procedure(s) with comments: THROMBECTOMY FEMORAL ARTERY Left leg (Left) - Thrombectomy of Left femoral artery with left femoral endarterectomy with left above the knee amputation. AMPUTATION ABOVE KNEE (Left)  Patient Location: SICU  Anesthesia Type:General  Level of Consciousness: Patient remains intubated per anesthesia plan  Airway & Oxygen Therapy: Patient remains intubated per anesthesia plan and Patient placed on Ventilator (see vital sign flow sheet for setting)  Post-op Assessment: Report given to PACU RN and Post -op Vital signs reviewed and stable  Post vital signs: Reviewed and stable  Complications: No apparent anesthesia complications

## 2013-07-02 NOTE — Progress Notes (Signed)
ANTICOAGULATION CONSULT NOTE - Initial Consult  Pharmacy Consult for Heparin  Indication: s/p left iliofemoral embolectomy with L-AKA, afib  Allergies  Allergen Reactions  . Levaquin [Levofloxacin] Rash  . Morphine And Related Other (See Comments)    Confusion, agitation  . Codeine Nausea And Vomiting    Noted on MAR  . Penicillins Rash    Noted on [facility]    Patient Measurements: Height: 5\' 6"  (167.6 cm) Weight: 125 lb 11.2 oz (57.017 kg) IBW/kg (Calculated) : 63.8 Heparin dosing weight 57 kg  Vital Signs: Temp: 98.7 F (37.1 C) (01/23 1200) Temp src: Oral (01/23 1200) BP: 96/67 mmHg (01/23 1200) Pulse Rate: 110 (01/23 1200)  Labs:  Recent Labs  07/01/13 2003 07/01/13 2328 07/02/13 0043 07/02/13 0215 07/02/13 0805 07/02/13 1030  HGB 7.5* 8.5* 11.2* 11.0*  --   --   HCT 22.3* 25.0* 33.0* 32.7*  --   --   PLT 168  --   --  120*  --   --   APTT 42*  --   --   --   --   --   LABPROT 16.2*  --   --  16.8*  --   --   INR 1.33  --   --  1.40  --   --   CREATININE 1.14  --   --  1.00  --  1.00  TROPONINI  --   --   --  <0.30 <0.30  --     Medical History: Past Medical History  Diagnosis Date  . CAD (coronary artery disease)   . Hyperlipidemia   . Diabetes mellitus   . Hypertension   . Suprapubic catheter   . Peripheral vascular disease   . Arthritis   . Myocardial infarction   . Cancer     prostate cancer  . A-fib    Assessment: 80 y/o M s/p left iliofemoral embolectomy with L-AKA, h/o afib, Recently discontinued Coumadin because of worsening anemia and on Lovenox PTA, last dose likely 1/22 in the AM, heparin started early this morning with no bolus. Heparin level < 0.1 (confirmed with lab, lab result didn't cross over to Epic) on 600 units/hr, hgb 11, plt 168 > 120, trending down slightly. No bleeding noted per chart. Pending decision on long-term anticoagulation.   Goal of Therapy:  Heparin level 0.3-0.7 units/ml Monitor platelets by anticoagulation  protocol: Yes   Plan:  -Increase heparin drip to 800 units/hr -8 hour HL at 2200 -Daily CBC/HL, watch plt closely. -Monitor for bleeding  Maryanna Shape, PharmD, BCPS  Clinical Pharmacist  Pager: [phone]   07/02/2013,1:44 PM

## 2013-07-02 NOTE — Anesthesia Postprocedure Evaluation (Signed)
  Anesthesia Post-op Note  Patient: [name]  Procedure(s) Performed: Procedure(s) with comments: THROMBECTOMY FEMORAL ARTERY Left leg (Left) - Thrombectomy of Left femoral artery with left femoral endarterectomy with left above the knee amputation. AMPUTATION ABOVE KNEE (Left)  Patient Location: ICU  Anesthesia Type:General  Level of Consciousness: sedated  Airway and Oxygen Therapy: Patient remains intubated per anesthesia plan and Patient placed on Ventilator (see vital sign flow sheet for setting)  Post-op Pain: none  Post-op Assessment: Post-op Vital signs reviewed  Post-op Vital Signs: Reviewed  Complications: No apparent anesthesia complications

## 2013-07-02 NOTE — Progress Notes (Signed)
   Daily Progress Note  Assessment/Planning: POD #1 s/p L iliofemoral thrombectomy and endarterectomy, L AKA   Dead L Lower leg: no evidence of lasting damage from L lower leg death, kidney function ok this AM  Thromboembolism L iliofemoral artery: continue Heparin drip for now, no evidence of big bleed from L groin as minimal drainage from JP at this point, continue JP for the next two days  L AKA: dressing off tomorrow  Unstable Afib: hypotensive yesterday but rate controlled, Cardio c/s called in  Anemia: adequate resuscitation with 4 u pRBC despite 800 cc EBL, will need serial CBC given h/o of possible bleeding on anticoagulation  Vent/Sedation: likely candidate for extubation as hemodynamically stable with following commands, might want to wait on Cardio input before extubating due to possibility of cardioversion (unlikely given chronic status of his afib)  Multiple co-morbidities: will probably need Int Med input as pt recovers from acute stage  Subjective  - 1 Day Post-Op  Intubated but tracks somewhat to voice  Objective Filed Vitals:   07/02/13 0530 07/02/13 0600 07/02/13 0700 07/02/13 0803  BP: 94/65 85/59 99/76    Pulse: 99 93 97   Temp:    98.7 F (37.1 C)  TempSrc:    Oral  Resp: 18 15 19    Height:      Weight:      SpO2: 100% 99% 100%     Intake/Output Summary (Last 24 hours) at 07/02/13 0809 Last data filed at 07/02/13 0700  Gross per 24 hour  Intake 3384.29 ml  Output   1359 ml  Net 2025.29 ml    PULM  Mech vent sounds, sym exp. ABG    Component Value Date/Time   PHART 7.426 07/02/2013 0208   PCO2ART 37.4 07/02/2013 0208   PO2ART 511.0* 07/02/2013 0208   HCO3 24.6* 07/02/2013 0208   TCO2 26 07/02/2013 0208   O2SAT 100.0 07/02/2013 0208   pCXR (07/02/13) 1. Endotracheal tube seen ending 3-4 cm above the carina.  2. Left IJ line seen ending about the mid SVC.  3. Lungs mildly hypoexpanded. Mild right-sided airspace opacity may reflect atelectasis or  possibly mild pneumonia.  CV   Irr, irr, no pressors  GI  soft, NTND, suprapubic catheter    VASC  L groin bandaged without actively bleeding, JP only ~25 cc overnight, L AKA bandaged  Laboratory CBC    Component Value Date/Time   WBC 11.0* 07/02/2013 0215   HGB 11.0* 07/02/2013 0215   HCT 32.7* 07/02/2013 0215   PLT 120* 07/02/2013 0215    BMET    Component Value Date/Time   NA 138 07/02/2013 0215   K 3.4* 07/02/2013 0215   CL 106 07/02/2013 0215   CO2 20 07/02/2013 0215   GLUCOSE 160* 07/02/2013 0215   BUN 27* 07/02/2013 0215   CREATININE 1.00 07/02/2013 0215   CREATININE 0.96 04/03/2011 0857   CALCIUM 6.7* 07/02/2013 0215   GFRNONAA 69* 07/02/2013 0215   GFRAA 80* 07/02/2013 0215    Adele Barthel, MD [facility]: [phone] Pager: [phone]  07/02/2013, 8:09 AM

## 2013-07-02 NOTE — Progress Notes (Signed)
ANTICOAGULATION CONSULT NOTE   Pharmacy Consult for Heparin  Indication: s/p left iliofemoral embolectomy with L-AKA, afib  Allergies  Allergen Reactions  . Levaquin [Levofloxacin] Rash  . Morphine And Related Other (See Comments)    Confusion, agitation  . Codeine Nausea And Vomiting    Noted on MAR  . Penicillins Rash    Noted on [facility]    Patient Measurements: Height: 5\' 6"  (167.6 cm) Weight: 125 lb 11.2 oz (57.017 kg) IBW/kg (Calculated) : 63.8 50.9kg  Vital Signs: Temp: 98.5 F (36.9 C) (01/23 2000) Temp src: Oral (01/23 2000) BP: 103/74 mmHg (01/23 2200) Pulse Rate: 116 (01/23 2200)  Labs:  Recent Labs  07/01/13 2003 07/01/13 2328 07/02/13 0043 07/02/13 0215 07/02/13 0805 07/02/13 1030 07/02/13 1335 07/02/13 1830 07/02/13 2200  HGB 7.5* 8.5* 11.2* 11.0*  --   --   --   --   --   HCT 22.3* 25.0* 33.0* 32.7*  --   --   --   --   --   PLT 168  --   --  120*  --   --   --   --   --   APTT 42*  --   --   --   --   --   --   --   --   LABPROT 16.2*  --   --  16.8*  --   --   --   --   --   INR 1.33  --   --  1.40  --   --   --   --   --   HEPARINUNFRC  --   --   --   --   --   --   --  <0.10* <0.10*  CREATININE 1.14  --   --  1.00  --  1.00  --   --   --   TROPONINI  --   --   --  <0.30 <0.30  --  <0.30  --   --     Medical History: Past Medical History  Diagnosis Date  . CAD (coronary artery disease)   . Hyperlipidemia   . Diabetes mellitus   . Hypertension   . Suprapubic catheter   . Peripheral vascular disease   . Arthritis   . Myocardial infarction   . Cancer     prostate cancer  . A-fib    Assessment: 80 y/o M s/p left iliofemoral embolectomy with L-AKA, h/o afib, on heparin per pharmacy. Lovenox PTA, last dose likely 1/22 in the AM, HL still undetectable after rate increase. No issues per RN.   Goal of Therapy:  Heparin level 0.3-0.7 units/ml Monitor platelets by anticoagulation protocol: Yes   Plan:  -Increase heparin drip to 1000  units/hr -8 hour HL at 0800 -Daily CBC/HL -Monitor for bleeding -F/U long term AC plans  Kriss, Ishler 07/02/2013,11:28 PM

## 2013-07-02 NOTE — Op Note (Addendum)
OPERATIVE NOTE   PROCEDURE: 1. left iliofemoral endarterectomy  2. Left Iliofemoral thromboembolectomy 3. Left above-knee amputation   PRE-OPERATIVE DIAGNOSIS: Dead left lower leg, iliofemoral thromboembolism  POST-OPERATIVE DIAGNOSIS: same as above   SURGEON: Adele Barthel, MD  ASSISTANT(S): Gerri Lins, PAC   ANESTHESIA: general  ESTIMATED BLOOD LOSS: 800 cc  BLOOD: 4 units pRBC  FINDING(S): 1. Severely calcified left common, superficial and profunda femoral arteries 2. Severely narrowed common femoral artery due to >90% calcific stenosis 3. Calcified distal external iliac artery 4. Likely embolism in external iliac artery, common femoral artery, superficial femoral artery and profunda femoral artery   SPECIMEN(S):  none  INDICATIONS:   [name] is a 80 y.o. male who presents with acute presentation of left leg pain.  By the time, I evaluated him his left lower leg already appeared dead.  Additionally, his exam was consistent with a left iliofemoral embolism.  I recommended to the patient and family: left leg thromboembolectomy and possible left above-knee amputation.  The risk, benefits, and alternative for the thromboemboletomy were discussed with the patient.  The patient and family is aware the risks include but are not limited to: bleeding, infection, myocardial infarction, stroke, limb loss, nerve damage, need for additional procedures in the future, wound complications, and inability to complete the bypass.  I discussed in depth the nature of above-the-knee amputation with the patient and family, including risks, benefits, and alternatives.  The patient is aware that the risks of above-the-knee amputation include but are not limited to: bleeding, infection, myocardial infarction, stroke, death, failure to heal amputation wound, and possible need for more proximal amputation.  Because of the patient's multiple high co-morbidities, I discussed in depth the  possibility of death in this patient.  The patient is aware of the risks and agrees proceed forward with the procedure.  DESCRIPTION: After obtaining full informed written consent, the patient was brought back to the operating room and placed supine upon the operating table.  The patient received IV antibiotics prior to induction.  After obtaining adequate anesthesia, the patient was prepped and draped in the standard fashion for: left femoral exposure.  Attention was turned to the left groin.  Under Sonosite guidance, I marked the common femoral artery on the skin.  I made an incision over the common femoral artery and dissected down to the common femoral artery with electrocautery.  I dissected out the common femoral artery from the distal external iliac artery down to the femoral bifurcation.  On initial inspection, the common femoral artery was: severely calcified.  Based on the exam, I elected to proceed with: endarterectomy.  I dissected out all circumflex branches and the profunda femoral artery and superficial femoral artery.  Control of all branches was obtained with vessel loops.  I found a softer area in the distal external iliac artery amendable to clamping.  The patient was given 5000 units of Heparin intravenously, which was a therapeutic bolus.  After waiting 3 minutes, I clamped the distal external iliac artery and placed all circumflex branches, and the profunda and superficial femoral arteries under tension.  I made an arteriotomy in the common femoral artery with a 11-blade and extended it with a Potts scissor proximally and distally.  After suctioning out the artery, the common femoral artery was inspected: a near occluding severely calcified plaque was present in the mid-segment.  There was obvious acute clot with what appeared to be embolized atheromatous plaque.  Based on the exam,  the plan was: iliofemoral endarterectomy.    I began the endarterectomy in the mid-common femoral artery  with a Penfield.  I transected the plaque at this segment and carried the dissection into the distal external iliac artery.  I was able to extract the calcified circumferential plaque from the external iliac artery.  I also extracted some clot from the external iliac artery.  I did not have good inflow at this point, so I passed a 4 Fogarty balloon proximally.  After two passes, I still did not get good inflow as I was hitting an obstruction.  In the process of the third pass, the thrombotic plug popped out the external iliac artery resulting in vigorous pulsatile bleeding.  I clamped off the distal external iliac artery.  I then carried the dissection distally into the superficial femoral and profunda femoral arteries.  I was able to extract the circumferential plaque in the proximal superficial femoral artery but it continued to extend into the superficial femoral artery.  There was extensive acute thrombus in the superficial femoral artery.  I passed the 4 Fogarty balloon in the superficial femoral artery, extracting clot to 50 cm.  I made nearly 10 passes before getting no further thrombus.  There was extensive resistance consistent with calcific plaque at 30 cm, which ruptured the balloon.  I also carried the endarterectomy into two profunda branches which were heavily calcified.  I feathered out the plaque.  One of the branches had significant injury to the wall in this process, so the wall required extensive repaired with 6-0 Prolene stitches.  At this point, I extracting remaining loose intimal strands.  The wall was quite attenuated from the extensive injury by the calcific atherosclerosis.  I did not feel patching the artery was wise, as the common femoral artery was somewhat ectatic already.  I repaired this left common femoral artery with a running stitch of 5-0 Prolene.  Multiple holes were repaired with 6-0 Prolene stitches.  Prior to completing the repair, I backbled the profunda femoral artery:  vigorous flow,  superficial femoral artery: minimal flow, and distal external iliac artery: pulsatile vigorous flow.  The repair was completed in the usual fashion.    At this point, I placed thrombin and gelfoam in the wound.  After waiting several minutes, active bleeding had mostly stopped.  Bleeding points were controlled with electrocautery.  The common femoral artery demonstrated no active bleeding and was pulsatile at this point.  As I was planning on continuing anticoagulation, I elected to place a drain.  I made a stab incision lateral to the femoral exposure and routed the drain through this incision.  I cut the Kingston drain to length for the wound.  The drain was secured with a 3-0 silk stitch tied to the drain.  I placed the drain immediately superficial to the common femoral artery.  The incision was repaired with a double layer of 2-0 Vicryl and a double layer of 3-0 Vicryl.  I elected to reapproximated the skin with staples due to the concern with bleeding.  The skin was cleaned, dried, and a sterile dressing applied.    At this point, I elected to proceed with the above-knee amputation as the lower leg demonstrated rigor mortis at this point.  I placed a sterile tourniquet on the thigh.  I marked out the anterior and posterior flaps for a fish-mouth type of amputation.  I exsanguinated the leg with an Esmarch bandage and then inflated the tourniquet to  250 mm Hg.   I made the incisions for these flaps, and then dissected through the subcutaneous tissue, fascia, and muscles circumferentially.  I elevated  the periosteal tissue 4 cm more proximal than the anterior skin flap.  I then transected the femur with a power saw at this level.  Then I smoothed out the rough edges of the bone with a rasp.  At this point, the specimen was passed off the field as the above-the-knee amputation.  At this point, I clamped all visibly bleeding arteries and veins using a combination of suture ligation  with Vicryl suture and electrocautery.  The tourniquet was then deflated at this point.  Bleeding continued to be controlled with electrocautery and suture ligature.  The stump was washed off with sterile normal saline and no further active bleeding was noted.  I reapproximated the anterior and posterior fascia  with interrupted stitches of 2-0 Vicryl.  This was completed along the entire length of anterior and posterior fascia until there were no more loose space in the fascial line.  The skin was then  reapproximated with staples.  The stump was washed off and dried.  The incision was dressed with [employer] and  then fluffs were applied.  Kerlix was wrapped around the leg and then gently an ACE wrap was applied.    COMPLICATIONS: none  CONDITION: guarded  Adele Barthel, MD [facility] Office: [phone] Pager: [phone]  07/02/2013, 1:26 AM

## 2013-07-03 DIAGNOSIS — R1310 Dysphagia, unspecified: Secondary | ICD-10-CM

## 2013-07-03 LAB — CBC
HCT: 28.9 % — ABNORMAL LOW (ref 39.0–52.0)
HEMOGLOBIN: 10 g/dL — AB (ref 13.0–17.0)
MCH: 30.1 pg (ref 26.0–34.0)
MCHC: 34.6 g/dL (ref 30.0–36.0)
MCV: 87 fL (ref 78.0–100.0)
Platelets: 142 10*3/uL — ABNORMAL LOW (ref 150–400)
RBC: 3.32 MIL/uL — AB (ref 4.22–5.81)
RDW: 16.2 % — ABNORMAL HIGH (ref 11.5–15.5)
WBC: 11.5 10*3/uL — AB (ref 4.0–10.5)

## 2013-07-03 LAB — GLUCOSE, CAPILLARY
GLUCOSE-CAPILLARY: 98 mg/dL (ref 70–99)
GLUCOSE-CAPILLARY: 117 mg/dL — AB (ref 70–99)
GLUCOSE-CAPILLARY: 168 mg/dL — AB (ref 70–99)
Glucose-Capillary: 104 mg/dL — ABNORMAL HIGH (ref 70–99)
Glucose-Capillary: 82 mg/dL (ref 70–99)
Glucose-Capillary: 85 mg/dL (ref 70–99)

## 2013-07-03 LAB — BASIC METABOLIC PANEL
BUN: 22 mg/dL (ref 6–23)
CHLORIDE: 111 meq/L (ref 96–112)
CO2: 19 meq/L (ref 19–32)
CREATININE: 1.01 mg/dL (ref 0.50–1.35)
Calcium: 6.8 mg/dL — ABNORMAL LOW (ref 8.4–10.5)
GFR calc Af Amer: 79 mL/min — ABNORMAL LOW (ref >90)
GFR calc non Af Amer: 69 mL/min — ABNORMAL LOW (ref >90)
Glucose, Bld: 106 mg/dL — ABNORMAL HIGH (ref 70–99)
Potassium: 3.3 mEq/L — ABNORMAL LOW (ref 3.7–5.3)
SODIUM: 141 meq/L (ref 137–147)

## 2013-07-03 LAB — IRON AND TIBC
Iron: 46 ug/dL (ref 42–135)
SATURATION RATIOS: 67 % — AB (ref 20–55)
TIBC: 69 ug/dL — ABNORMAL LOW (ref 215–435)
UIBC: 23 ug/dL — AB (ref 125–400)

## 2013-07-03 LAB — OCCULT BLOOD X 1 CARD TO LAB, STOOL: Fecal Occult Bld: NEGATIVE

## 2013-07-03 LAB — HEPARIN LEVEL (UNFRACTIONATED)
HEPARIN UNFRACTIONATED: 0.16 [IU]/mL — AB (ref 0.30–0.70)
Heparin Unfractionated: 0.19 IU/mL — ABNORMAL LOW (ref 0.30–0.70)

## 2013-07-03 LAB — MAGNESIUM: MAGNESIUM: 1.8 mg/dL (ref 1.5–2.5)

## 2013-07-03 MED ORDER — HYDROMORPHONE HCL PF 1 MG/ML IJ SOLN
0.5000 mg | INTRAMUSCULAR | Status: DC | PRN
  Administered 2013-07-04: 0.5 mg via INTRAVENOUS
  Filled 2013-07-03: qty 1

## 2013-07-03 MED ORDER — TRAMADOL HCL 50 MG PO TABS
50.0000 mg | ORAL_TABLET | ORAL | Status: DC | PRN
  Administered 2013-07-03 – 2013-07-07 (×4): 50 mg via ORAL
  Filled 2013-07-03 (×4): qty 1

## 2013-07-03 MED ORDER — POTASSIUM CHLORIDE 10 MEQ/50ML IV SOLN
10.0000 meq | INTRAVENOUS | Status: AC
  Administered 2013-07-03 (×2): 10 meq via INTRAVENOUS
  Filled 2013-07-03 (×2): qty 50

## 2013-07-03 MED ORDER — MEMANTINE HCL ER 28 MG PO CP24
28.0000 mg | ORAL_CAPSULE | Freq: Every day | ORAL | Status: DC
  Administered 2013-07-04 – 2013-07-07 (×4): 28 mg via ORAL
  Filled 2013-07-03 (×5): qty 28

## 2013-07-03 MED ORDER — ATORVASTATIN CALCIUM 10 MG PO TABS
10.0000 mg | ORAL_TABLET | Freq: Every day | ORAL | Status: DC
  Administered 2013-07-03 – 2013-07-06 (×4): 10 mg via ORAL
  Filled 2013-07-03 (×6): qty 1

## 2013-07-03 MED ORDER — DIGOXIN 0.25 MG/ML IJ SOLN
0.1250 mg | Freq: Every day | INTRAMUSCULAR | Status: DC
  Filled 2013-07-03 (×2): qty 0.5

## 2013-07-03 MED ORDER — PANTOPRAZOLE SODIUM 40 MG PO TBEC
40.0000 mg | DELAYED_RELEASE_TABLET | Freq: Every day | ORAL | Status: DC
  Administered 2013-07-04 – 2013-07-07 (×4): 40 mg via ORAL
  Filled 2013-07-03 (×4): qty 1

## 2013-07-03 MED ORDER — DIGOXIN 250 MCG PO TABS
0.2500 mg | ORAL_TABLET | Freq: Once | ORAL | Status: AC
  Administered 2013-07-03: 0.25 mg via ORAL
  Filled 2013-07-03: qty 1

## 2013-07-03 MED ORDER — DIGOXIN 0.25 MG/ML IJ SOLN
0.2500 mg | Freq: Once | INTRAMUSCULAR | Status: DC
  Filled 2013-07-03: qty 1

## 2013-07-03 NOTE — Progress Notes (Signed)
ANTICOAGULATION CONSULT NOTE   Pharmacy Consult for Heparin  Indication: s/p left iliofemoral embolectomy with L-AKA, afib  Allergies  Allergen Reactions  . Levaquin [Levofloxacin] Rash  . Morphine And Related Other (See Comments)    Confusion, agitation  . Codeine Nausea And Vomiting    Noted on MAR  . Penicillins Rash    Noted on [facility]    Patient Measurements: Height: 5\' 6"  (167.6 cm) Weight: 125 lb 11.2 oz (57.017 kg) IBW/kg (Calculated) : 63.8 Actual Wt < IBW  Vital Signs: Temp: 99.9 F (37.7 C) (01/24 0400) Temp src: Oral (01/24 0400) BP: 84/60 mmHg (01/24 0900) Pulse Rate: 68 (01/24 0900)  Labs:  Recent Labs  07/01/13 2003  07/02/13 0043 07/02/13 0215 07/02/13 0805 07/02/13 1030 07/02/13 1335 07/02/13 1830 07/02/13 2200 07/03/13 0420 07/03/13 0800  HGB 7.5*  < > 11.2* 11.0*  --   --   --   --   --   --  10.0*  HCT 22.3*  < > 33.0* 32.7*  --   --   --   --   --   --  28.9*  PLT 168  --   --  120*  --   --   --   --   --   --  142*  APTT 42*  --   --   --   --   --   --   --   --   --   --   LABPROT 16.2*  --   --  16.8*  --   --   --   --   --   --   --   INR 1.33  --   --  1.40  --   --   --   --   --   --   --   HEPARINUNFRC  --   --   --   --   --   --   --  <0.10* <0.10*  --  0.16*  CREATININE 1.14  --   --  1.00  --  1.00  --   --   --  1.01  --   TROPONINI  --   --   --  <0.30 <0.30  --  <0.30  --   --   --   --   < > = values in this interval not displayed.  Medical History: Past Medical History  Diagnosis Date  . CAD (coronary artery disease)   . Hyperlipidemia   . Diabetes mellitus   . Hypertension   . Suprapubic catheter   . Peripheral vascular disease   . Arthritis   . Myocardial infarction   . Cancer     prostate cancer  . A-fib    Assessment: 80 yo M s/p left iliofemoral embolectomy with L-AKA, h/o afib, on heparin per pharmacy. Lovenox PTA, last dose likely 1/22 in the AM, HL still subtherapeutic (0.16) after rate increase.  H/H  low with acute post-op anemia, and plt trending up to 142. No bleeding issues reported.  Goal of Therapy:  Heparin level 0.3-0.7 units/ml Monitor platelets by anticoagulation protocol: Yes   Plan:  - Increase heparin gtt to 1200 units/hr, no bolus - draw 8h HL - daily CBC/HL - monitor for s/s of bleeding - f/u long term anticoagulation plans  Ovid Curd E. Jacqlyn Larsen, PharmD Clinical Pharmacist - Resident Pager: [phone] Pharmacy: [phone] 07/03/2013 9:21 AM

## 2013-07-03 NOTE — Progress Notes (Signed)
[facility] ADULT ICU REPLACEMENT PROTOCOL FOR AM LAB REPLACEMENT ONLY  The patient does apply for the [facility] Adult ICU Electrolyte Replacment Protocol based on the criteria listed below:   1. Is GFR >/= 40 ml/min? yes  Patient's GFR today is 69 2. Is urine output >/= 0.5 ml/kg/hr for the last 6 hours? yes Patient's UOP is 0.6 ml/kg/hr 3. Is BUN < 60 mg/dL? yes  Patient's BUN today is 22 4. Abnormal electrolyte(s):K is 3.3, Mg is 1.8 5. Ordered repletion with: per protocal 6. If a panic level lab has been reported, has the CCM MD in charge been notified? no.   Physician:    Fraser Din [place] 07/03/2013 5:32 AM

## 2013-07-03 NOTE — Progress Notes (Signed)
ANTICOAGULATION CONSULT NOTE   Pharmacy Consult for Heparin  Indication: s/p left iliofemoral embolectomy with L-AKA, afib  Allergies  Allergen Reactions  . Levaquin [Levofloxacin] Rash  . Morphine And Related Other (See Comments)    Confusion, agitation  . Codeine Nausea And Vomiting    Noted on MAR  . Penicillins Rash    Noted on MAR    Labs:  Recent Labs  07/01/13 2003  07/02/13 0043 07/02/13 0215 07/02/13 0805 07/02/13 1030 07/02/13 1335  07/02/13 2200 07/03/13 0420 07/03/13 0800 07/03/13 1635  HGB 7.5*  < > 11.2* 11.0*  --   --   --   --   --   --  10.0*  --   HCT 22.3*  < > 33.0* 32.7*  --   --   --   --   --   --  28.9*  --   PLT 168  --   --  120*  --   --   --   --   --   --  142*  --   APTT 42*  --   --   --   --   --   --   --   --   --   --   --   LABPROT 16.2*  --   --  16.8*  --   --   --   --   --   --   --   --   INR 1.33  --   --  1.40  --   --   --   --   --   --   --   --   HEPARINUNFRC  --   --   --   --   --   --   --   < > <0.10*  --  0.16* 0.19*  CREATININE 1.14  --   --  1.00  --  1.00  --   --   --  1.01  --   --   TROPONINI  --   --   --  <0.30 <0.30  --  <0.30  --   --   --   --   --   < > = values in this interval not displayed.  Assessment: 80 yo M s/p left iliofemoral embolectomy with L-AKA, h/o afib, on heparin per pharmacy. Lovenox PTA, last dose likely 1/22 in the AM.  Heparin level still low at 0.19  Goal of Therapy:  Heparin level 0.3-0.7 units/ml Monitor platelets by anticoagulation protocol: Yes   Plan:  - Increase heparin gtt to 1450 units / hr - Follow up AM labs  Thank you. Anette Guarneri, PharmD [phone]  07/03/2013 6:24 PM

## 2013-07-03 NOTE — Evaluation (Signed)
Clinical/Bedside Swallow Evaluation Patient Details  Name: [name] MRN: [id number] Date of Birth: 10-04-1931  Today's Date: 07/03/2013 Time: 8756-4332 SLP Time Calculation (min): 25 min  Past Medical History:  Past Medical History  Diagnosis Date  . CAD (coronary artery disease)   . Hyperlipidemia   . Diabetes mellitus   . Hypertension   . Suprapubic catheter   . Peripheral vascular disease   . Arthritis   . Myocardial infarction   . Cancer     prostate cancer  . A-fib    Past Surgical History:  Past Surgical History  Procedure Laterality Date  . Prostatectomy      radiation therapy  . Bladder cath    . Laminectomy      lumbar  . Lumbar disc surgery    . 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   Marland Kitchen Umbilical hernia repair  9518'A, 4166    umbilical hernia repair in 0630Z, super umbilical hernia repaired in 2006  . Coronary artery bypass graft      2008  . Total hip arthroplasty      RT  . Cataract extraction  2012    Left 03/2011, Right 04/2011  . Amputation  12/03/2011    Procedure: AMPUTATION BELOW KNEE;  Surgeon: Elam Dutch, MD;  Location: [facility] OR;  Service: Vascular;  Laterality: Right;  . Esophagogastroduodenoscopy  02/12/2012    Procedure: ESOPHAGOGASTRODUODENOSCOPY (EGD);  Surgeon: Milus Banister, MD;  Location: [place];  Service: Endoscopy;  Laterality: N/A;  . Peg placement  02/18/2012    Procedure: PERCUTANEOUS ENDOSCOPIC GASTROSTOMY (PEG) PLACEMENT;  Surgeon: Inda Castle, MD;  Location: [place];  Service: Endoscopy;  Laterality: N/A;   HPI:  80 y.o. (04-09-1932) male known history of PAD requiring prior R BKA, DM, CAD, MI, prostate CA who presents with chief complaint: left leg pain. Pt's memory is somewhat limited so history obtained from patient and family. Onset this AM per patient, but per family definitely ~1 pm, pt's leg was found to be painful and pale. The patient notes mild-moderate pain in left  foot, worse with movement of the foot or manipulation with "painful" character.  Underwent Left above-knee amputation 1/23.  Extensive dysphagia history includes MBS 10/23/11 with penetration of thin with barium pill, recommended Dys 3 and thin and suspected primary esophageal dysphagia with recommendation for esophageal f/u.Marland Kitchen  MBS 02/13/12 revealed aspiration prior to swallow with thin recommended Dy3, nectar liquids with water protocol.  EGD 02/12/12 found pooling of secretions in distal esophagus and possible achalasia with consideration of botox injections.  CXR 07/02/13/ revealed lungs mildly hypoexpanded. Mild right-sided airspace opacity may reflect atelectasis or possibly mild pneumonia.    Assessment / Plan / Recommendation Clinical Impression  Pt. seen for swallow assessement with substantial history of pharyngeal and esophageal deficits (likely achalasia on EGD 02/2012).  Pt. exhibited memory deficits as he denied history of swallowing/esophageal difficulty or AKA surgery yesterday.  Decreased oral prep, mastication and tranist evidenced by prolonged mastication.  No overt indications of pharyngeal dysphagia during assessment, however given history dysphagia and brief intubation yesterday, recommend conservative diet texture of Dys 2 and nectar thick.  SLP will continue to follow and diagnostically reassess for upgrade at bedside versus need for objective swallow assessment.    Aspiration Risk  Moderate    Diet Recommendation Dysphagia 2 (Fine chop);Nectar-thick liquid   Liquid Administration via: Spoon;No straw Medication Administration: Whole meds with puree Supervision:  Patient able to self feed;Full supervision/cueing for compensatory strategies Compensations: Slow rate;Small sips/bites;Follow solids with liquid Postural Changes and/or Swallow Maneuvers: Seated upright 90 degrees;Upright 30-60 min after meal    Other  Recommendations Oral Care Recommendations: Oral care BID Other  Recommendations: Order thickener from pharmacy   Follow Up Recommendations  Skilled Nursing facility    Frequency and Duration min 2x/week  2 weeks   Pertinent Vitals/Pain Denied pain         Swallow Study         Oral/Motor/Sensory Function Overall Oral Motor/Sensory Function: Appears within functional limits for tasks assessed   Ice Chips Ice chips: Impaired Presentation: Spoon Pharyngeal Phase Impairments: Suspected delayed Swallow   Thin Liquid Thin Liquid: Impaired Presentation: Cup Pharyngeal  Phase Impairments: Suspected delayed Swallow    Nectar Thick Nectar Thick Liquid: Not tested   Honey Thick Honey Thick Liquid: Not tested   Puree Puree: Within functional limits   Solid   GO    Solid: Impaired Oral Phase Impairments: Impaired anterior to posterior transit;Reduced lingual movement/coordination Oral Phase Functional Implications:  (delayed transit)       Houston Siren M.Ed [employer] [phone]  07/03/2013

## 2013-07-03 NOTE — Progress Notes (Signed)
PULMONARY / CRITICAL CARE MEDICINE  Name: [name] MRN: [id number] DOB: May 05, 1932    ADMISSION DATE:  07/01/2013 CONSULTATION DATE:  07/02/12  REFERRING MD :  Vascular  CHIEF COMPLAINT:  Ventilator management  BRIEF PATIENT DESCRIPTION: 80 yo with CAD, AF, HLD, DM II, HTN, PAD, and prostate CA s/p prostatectomy, presened to the ED with complaints of left leg pain, admitted for iliofemoral thromboembolism, now s/p AKA.  SIGNIFICANT EVENTS / STUDIES:  1/23  OR >>> Left AKA  LINES / TUBES: OETT 1/23 >>> 1/23 LIJ CVL 1/23 >>> L radial a-line 1/23 >>> Suprapubic catheter >>>  CULTURES:  ANTIBIOTICS: Vancomycin 1/23 x 1  SUBJECTIVE:  Up to a chair Interacts, comfortable ? At risk for aspiration based on his ability to swallow meds  VITAL SIGNS: Temp:  [98.5 F (36.9 C)-99.9 F (37.7 C)] 99.9 F (37.7 C) (01/24 0400) Pulse Rate:  [92-158] 158 (01/24 0700) Resp:  [10-26] 13 (01/24 0700) BP: (84-128)/(55-80) 84/55 mmHg (01/24 0700) SpO2:  [96 %-100 %] 99 % (01/24 0700) Arterial Line BP: (73-124)/(46-96) 96/46 mmHg (01/24 0700) FiO2 (%):  [40 %] 40 % (01/23 0848)  VENTILATOR SETTINGS: Vent Mode:  [-] CPAP;PSV FiO2 (%):  [40 %] 40 % PEEP:  [5 cmH20] 5 cmH20 Pressure Support:  [5 cmH20] 5 cmH20  INTAKE / OUTPUT: Intake/Output     01/23 0701 - 01/24 0700 01/24 0701 - 01/25 0700   I.V. (mL/kg) 2133.8 (37.4)    Blood     NG/GT 0    IV Piggyback 50    Total Intake(mL/kg) 2183.8 (38.3)    Urine (mL/kg/hr) 625 (0.5)    Drains 30 (0)    Stool 2 (0)    Blood     Total Output 657     Net +1526.8            PHYSICAL EXAMINATION: General: no distress, in chair Neuro: alert, follows commands, oriented x2 HEENT: OP  Cardiovascular: irregular Lungs:  No wheeze Abdomen:  Soft, non-distended. BS +. Suprapubic catheter in place, no erythema or discharge. Musculoskeletal:  RLE 28 (old), LLE AKA (new). Drain in place.  Skin:  UE ecchymoses. No rashes, or  erythema.  LABS:  CBC  Recent Labs Lab 07/01/13 2003 07/01/13 2328 07/02/13 0043 07/02/13 0215  WBC 9.4  --   --  11.0*  HGB 7.5* 8.5* 11.2* 11.0*  HCT 22.3* 25.0* 33.0* 32.7*  PLT 168  --   --  120*    Coag's  Recent Labs Lab 07/01/13 2003 07/02/13 0215  APTT 42*  --   INR 1.33 1.40   BMET  Recent Labs Lab 07/02/13 0215 07/02/13 1030 07/03/13 0420  NA 138 139 141  K 3.4* 3.6* 3.3*  CL 106 107 111  CO2 20 21 19   BUN 27* 26* 22  CREATININE 1.00 1.00 1.01  GLUCOSE 160* 120* 106*   Electrolytes  Recent Labs Lab 07/02/13 0215 07/02/13 1030 07/03/13 0420  CALCIUM 6.7* 6.6* 6.8*  MG  --   --  1.8   ABG  Recent Labs Lab 07/02/13 0208  PHART 7.426  PCO2ART 37.4  PO2ART 511.0*   Cardiac Enzymes  Recent Labs Lab 07/02/13 0215 07/02/13 0805 07/02/13 1335  TROPONINI <0.30 <0.30 <0.30   Glucose  Recent Labs Lab 07/02/13 0800 07/02/13 1249 07/02/13 1634 07/02/13 2049 07/02/13 2314 07/03/13 0418  GLUCAP 117* 111* 92 116* 98 104*    CXR:  Dg Chest Port 1 View  07/02/2013  CLINICAL DATA:  Endotracheal tube placement and left IJ line placement.  EXAM: PORTABLE CHEST - 1 VIEW  COMPARISON:  Chest radiograph performed 02/13/2012  FINDINGS: The patient's endotracheal tube is seen ending 3-4 cm above the carina. A left IJ line is seen ending about the mid SVC. An enteric tube is noted extending below the diaphragm.  The lungs are mildly hypoexpanded. Mild right-sided airspace opacity may reflect atelectasis or possibly mild pneumonia. The left lung appears clear. No pneumothorax is identified.  The cardiomediastinal silhouette is borderline normal in size. The patient is status post median sternotomy, with evidence of prior CABG. No acute osseous abnormalities are seen.  IMPRESSION: 1. Endotracheal tube seen ending 3-4 cm above the carina. 2. Left IJ line seen ending about the mid SVC. 3. Lungs mildly hypoexpanded. Mild right-sided airspace opacity may  reflect atelectasis or possibly mild pneumonia.   Electronically Signed   By: Garald Balding M.D.   On: 07/02/2013 02:38     ASSESSMENT / PLAN:  PULMONARY A: Acute resp failure > resolved P:   -bronchial hygiene -oxygen to keep SpO2 > 92% -f/u CXR as needed  CARDIOVASCULAR A:  Hypotension in setting of general anesthesia, positive pressure ventilation and sedation - resolved Left iliofemoral thrombus s/p thrombectomy S/p L AKA  H/o R BKA H/o CAD, HTN, AF, PVD > tachy in bursts on 1/24 P:  -per VVS and cardiology -resume lipitor -bursts of RVR > will defer starting home coreg for now, use prn metoprolol if sustained tachycardia  RENAL A:   Hypokalemia S/p prostatectomy for prostate CA Chronic suprapubic catheter P:   -monitor renal fx, urine outpt, electrolytes  GASTROINTESTINAL A:   GI Px Nutrition P:   -continue protonix -swallowing eval to determine diet  HEMATOLOGIC A:   Anemia. P:  -f/u CBC -heparin gtt per VVS and cardiology  INFECTIOUS A:   No active issues P:   -Monitor clinically  ENDOCRINE A:   DM type II   P:   -SSI -Hold Metformin until good PO intake  NEUROLOGIC A:   Post-op pain control. H/o Dementia. P:  -resume namenda -careful with pain meds  PCCM will sign off. Please call if we can help you   Baltazar Apo, MD, PhD 07/03/2013, 8:37 AM [facility] [phone] or if no answer [phone]

## 2013-07-03 NOTE — Progress Notes (Signed)
Subjective: Patient denies CP  Does note leg pain.  Breathng is fair   Objective: Filed Vitals:   07/03/13 1100 07/03/13 1115 07/03/13 1200 07/03/13 1219  BP: 103/72  98/62   Pulse: 130 120 78   Temp:    98.3 F (36.8 C)  TempSrc:    Oral  Resp: 14 16 15    Height:      Weight:      SpO2: 100% 100% 99%    Weight change:   Intake/Output Summary (Last 24 hours) at 07/03/13 1318 Last data filed at 07/03/13 1200  Gross per 24 hour  Intake 1937.17 ml  Output    697 ml  Net 1240.17 ml    General: Alert, awake, oriented x3, in no acute distress Neck:  JVP is normal Heart: Irregular rate and rhythm, without murmurs, rubs, gallops.  Lungs: Clear to auscultation.  No rales or wheezes.  Poor inspiratory effort   Exemities'  S/p bilateral amputation.  Neuro: Grossly intact, nonfocal.  Tele:  Afib 80s to 110s   Lab Results: Results for orders placed during the hospital encounter of 07/01/13 (from the past 24 hour(s))  TROPONIN I     Status: None   Collection Time    07/02/13  1:35 PM      Result Value Range   Troponin I <0.30  <0.30 ng/mL  BLOOD PRODUCT ORDER (VERBAL) VERIFICATION     Status: None   Collection Time    07/02/13  4:29 PM      Result Value Range   Blood product order confirm MD AUTHORIZATION REQUESTED    GLUCOSE, CAPILLARY     Status: None   Collection Time    07/02/13  4:34 PM      Result Value Range   Glucose-Capillary 92  70 - 99 mg/dL  HEPARIN LEVEL (UNFRACTIONATED)     Status: Abnormal   Collection Time    07/02/13  6:30 PM      Result Value Range   Heparin Unfractionated <0.10 (*) 0.30 - 0.70 IU/mL  GLUCOSE, CAPILLARY     Status: Abnormal   Collection Time    07/02/13  8:49 PM      Result Value Range   Glucose-Capillary 116 (*) 70 - 99 mg/dL  HEPARIN LEVEL (UNFRACTIONATED)     Status: Abnormal   Collection Time    07/02/13 10:00 PM      Result Value Range   Heparin Unfractionated <0.10 (*) 0.30 - 0.70 IU/mL  GLUCOSE, CAPILLARY     Status: None   Collection Time    07/02/13 11:14 PM      Result Value Range   Glucose-Capillary 98  70 - 99 mg/dL  GLUCOSE, CAPILLARY     Status: Abnormal   Collection Time    07/03/13  4:18 AM      Result Value Range   Glucose-Capillary 104 (*) 70 - 99 mg/dL   Comment 1 Documented in Chart     Comment 2 Notify RN    BASIC METABOLIC PANEL     Status: Abnormal   Collection Time    07/03/13  4:20 AM      Result Value Range   Sodium 141  137 - 147 mEq/L   Potassium 3.3 (*) 3.7 - 5.3 mEq/L   Chloride 111  96 - 112 mEq/L   CO2 19  19 - 32 mEq/L   Glucose, Bld 106 (*) 70 - 99 mg/dL   BUN 22  6 - 23 mg/dL  Creatinine, Ser 1.01  0.50 - 1.35 mg/dL   Calcium 6.8 (*) 8.4 - 10.5 mg/dL   GFR calc non Af Amer 69 (*) >90 mL/min   GFR calc Af Amer 79 (*) >90 mL/min  MAGNESIUM     Status: None   Collection Time    07/03/13  4:20 AM      Result Value Range   Magnesium 1.8  1.5 - 2.5 mg/dL  GLUCOSE, CAPILLARY     Status: None   Collection Time    07/03/13  7:59 AM      Result Value Range   Glucose-Capillary 82  70 - 99 mg/dL  CBC     Status: Abnormal   Collection Time    07/03/13  8:00 AM      Result Value Range   WBC 11.5 (*) 4.0 - 10.5 K/uL   RBC 3.32 (*) 4.22 - 5.81 MIL/uL   Hemoglobin 10.0 (*) 13.0 - 17.0 g/dL   HCT 28.9 (*) 39.0 - 52.0 %   MCV 87.0  78.0 - 100.0 fL   MCH 30.1  26.0 - 34.0 pg   MCHC 34.6  30.0 - 36.0 g/dL   RDW 16.2 (*) 11.5 - 15.5 %   Platelets 142 (*) 150 - 400 K/uL  HEPARIN LEVEL (UNFRACTIONATED)     Status: Abnormal   Collection Time    07/03/13  8:00 AM      Result Value Range   Heparin Unfractionated 0.16 (*) 0.30 - 0.70 IU/mL  GLUCOSE, CAPILLARY     Status: None   Collection Time    07/03/13 12:21 PM      Result Value Range   Glucose-Capillary 85  70 - 99 mg/dL    Studies/Results: @RISRSLT24 @  Medications: reviewed   @PROBHOSP @ 1.  Atrial fibrillation  Rates are a little high  May be aggravated by pain.  Will add Digoxin to regimen  Give 0.25 today then  0.125 Continue heparin  2.  S/p embolectomy  Continue heparin.    LOS: 2 days   Dorris Carnes 07/03/2013, 1:18 PM

## 2013-07-03 NOTE — Progress Notes (Signed)
Subjective: Interval History: none up in chair. Denies pain...   Objective: Vital signs in last 24 hours: Temp:  [98.5 F (36.9 C)-99.9 F (37.7 C)] 99.9 F (37.7 C) (01/24 0400) Pulse Rate:  [92-158] 158 (01/24 0700) Resp:  [10-26] 13 (01/24 0700) BP: (84-128)/(55-80) 84/55 mmHg (01/24 0700) SpO2:  [96 %-100 %] 99 % (01/24 0700) Arterial Line BP: (73-124)/(46-96) 96/46 mmHg (01/24 0700)  Intake/Output from previous day: 01/23 0701 - 01/24 0700 In: 2183.8 [I.V.:2133.8; IV Piggyback:50] Out: 518 [Urine:625; Drains:30; Stool:2] Intake/Output this shift:    Left groin and left above-knee incisions healing. Dressing removed and will be replaced. The C. drain today.  Lab Results:  Recent Labs  07/02/13 0215 07/03/13 0800  WBC 11.0* 11.5*  HGB 11.0* 10.0*  HCT 32.7* 28.9*  PLT 120* 142*   BMET  Recent Labs  07/02/13 1030 07/03/13 0420  NA 139 141  K 3.6* 3.3*  CL 107 111  CO2 21 19  GLUCOSE 120* 106*  BUN 26* 22  CREATININE 1.00 1.01  CALCIUM 6.6* 6.8*    Studies/Results: Dg Chest Port 1 View  07/02/2013   CLINICAL DATA:  Endotracheal tube placement and left IJ line placement.  EXAM: PORTABLE CHEST - 1 VIEW  COMPARISON:  Chest radiograph performed 02/13/2012  FINDINGS: The patient's endotracheal tube is seen ending 3-4 cm above the carina. A left IJ line is seen ending about the mid SVC. An enteric tube is noted extending below the diaphragm.  The lungs are mildly hypoexpanded. Mild right-sided airspace opacity may reflect atelectasis or possibly mild pneumonia. The left lung appears clear. No pneumothorax is identified.  The cardiomediastinal silhouette is borderline normal in size. The patient is status post median sternotomy, with evidence of prior CABG. No acute osseous abnormalities are seen.  IMPRESSION: 1. Endotracheal tube seen ending 3-4 cm above the carina. 2. Left IJ line seen ending about the mid SVC. 3. Lungs mildly hypoexpanded. Mild right-sided airspace  opacity may reflect atelectasis or possibly mild pneumonia.   Electronically Signed   By: Garald Balding M.D.   On: 07/02/2013 02:38   Anti-infectives: Anti-infectives   None      Assessment/Plan: s/p Procedure(s) with comments: THROMBECTOMY FEMORAL ARTERY Left leg (Left) - Thrombectomy of Left femoral artery with left femoral endarterectomy with left above the knee amputation. AMPUTATION ABOVE KNEE (Left) Continue foley due to patient critically ill Stable status post embolectomy and amputation. Drain will be removed. Hemodynamically stable the blood pressure still somewhat low. Acute postop anemia. We'll continue to watch.   LOS: 2 days   EARLY, TODD 07/03/2013, 8:51 AM

## 2013-07-04 DIAGNOSIS — N179 Acute kidney failure, unspecified: Secondary | ICD-10-CM

## 2013-07-04 LAB — BASIC METABOLIC PANEL
BUN: 20 mg/dL (ref 6–23)
BUN: 19 mg/dL (ref 6–23)
CHLORIDE: 117 meq/L — AB (ref 96–112)
CO2: 18 mEq/L — ABNORMAL LOW (ref 19–32)
CO2: 18 mEq/L — ABNORMAL LOW (ref 19–32)
Calcium: 6.4 mg/dL — CL (ref 8.4–10.5)
Calcium: 6.8 mg/dL — ABNORMAL LOW (ref 8.4–10.5)
Chloride: 115 mEq/L — ABNORMAL HIGH (ref 96–112)
Creatinine, Ser: 0.94 mg/dL (ref 0.50–1.35)
Creatinine, Ser: 1.01 mg/dL (ref 0.50–1.35)
GFR calc non Af Amer: 77 mL/min — ABNORMAL LOW (ref >90)
GFR, EST AFRICAN AMERICAN: 90 mL/min — AB (ref >90)
GFR, EST AFRICAN AMERICAN: 79 mL/min — AB (ref >90)
GFR, EST NON AFRICAN AMERICAN: 69 mL/min — AB (ref >90)
Glucose, Bld: 53 mg/dL — ABNORMAL LOW (ref 70–99)
Glucose, Bld: 100 mg/dL — ABNORMAL HIGH (ref 70–99)
POTASSIUM: 2.7 meq/L — AB (ref 3.7–5.3)
POTASSIUM: 4.4 meq/L (ref 3.7–5.3)
SODIUM: 141 meq/L (ref 137–147)
Sodium: 144 mEq/L (ref 137–147)

## 2013-07-04 LAB — GLUCOSE, CAPILLARY
GLUCOSE-CAPILLARY: 104 mg/dL — AB (ref 70–99)
GLUCOSE-CAPILLARY: 68 mg/dL — AB (ref 70–99)
GLUCOSE-CAPILLARY: 84 mg/dL (ref 70–99)
GLUCOSE-CAPILLARY: 92 mg/dL (ref 70–99)
Glucose-Capillary: 45 mg/dL — ABNORMAL LOW (ref 70–99)
Glucose-Capillary: 48 mg/dL — ABNORMAL LOW (ref 70–99)
Glucose-Capillary: 107 mg/dL — ABNORMAL HIGH (ref 70–99)
Glucose-Capillary: 95 mg/dL (ref 70–99)
Glucose-Capillary: 93 mg/dL (ref 70–99)

## 2013-07-04 LAB — MAGNESIUM: MAGNESIUM: 1.6 mg/dL (ref 1.5–2.5)

## 2013-07-04 LAB — HEPARIN LEVEL (UNFRACTIONATED): HEPARIN UNFRACTIONATED: 0.35 [IU]/mL (ref 0.30–0.70)

## 2013-07-04 LAB — ALBUMIN: ALBUMIN: 0.9 g/dL — AB (ref 3.5–5.2)

## 2013-07-04 LAB — PROTEIN, TOTAL: TOTAL PROTEIN: 4.4 g/dL — AB (ref 6.0–8.3)

## 2013-07-04 MED ORDER — DIGOXIN 125 MCG PO TABS
0.1250 mg | ORAL_TABLET | Freq: Every day | ORAL | Status: DC
  Administered 2013-07-05 – 2013-07-07 (×3): 0.125 mg via ORAL
  Filled 2013-07-04 (×3): qty 1

## 2013-07-04 MED ORDER — CARVEDILOL 3.125 MG PO TABS
3.1250 mg | ORAL_TABLET | Freq: Every day | ORAL | Status: DC
  Administered 2013-07-04 – 2013-07-07 (×4): 3.125 mg via ORAL
  Filled 2013-07-04 (×5): qty 1

## 2013-07-04 MED ORDER — SODIUM CHLORIDE 0.9 % IV SOLN
1.0000 g | Freq: Once | INTRAVENOUS | Status: AC
  Administered 2013-07-04: 1 g via INTRAVENOUS
  Filled 2013-07-04: qty 10

## 2013-07-04 MED ORDER — HETASTARCH-ELECTROLYTES 6 % IV SOLN
500.0000 mL | Freq: Once | INTRAVENOUS | Status: AC
Start: 2013-07-04 — End: 2013-07-04
  Administered 2013-07-04: 500 mL via INTRAVENOUS
  Filled 2013-07-04: qty 500

## 2013-07-04 MED ORDER — DIGOXIN 0.25 MG/ML IJ SOLN
0.2500 mg | Freq: Four times a day (QID) | INTRAMUSCULAR | Status: AC
  Administered 2013-07-04 (×3): 0.25 mg via INTRAVENOUS
  Filled 2013-07-04 (×6): qty 1

## 2013-07-04 MED ORDER — POTASSIUM CHLORIDE 10 MEQ/50ML IV SOLN
10.0000 meq | INTRAVENOUS | Status: AC
  Administered 2013-07-04 (×5): 10 meq via INTRAVENOUS
  Filled 2013-07-04 (×5): qty 50

## 2013-07-04 MED ORDER — POTASSIUM CHLORIDE 10 MEQ/50ML IV SOLN
10.0000 meq | INTRAVENOUS | Status: AC
  Administered 2013-07-04 (×3): 10 meq via INTRAVENOUS

## 2013-07-04 MED ORDER — POTASSIUM CHLORIDE 10 MEQ/50ML IV SOLN
INTRAVENOUS | Status: AC
Start: 2013-07-04 — End: 2013-07-04
  Filled 2013-07-04: qty 50

## 2013-07-04 MED ORDER — CARVEDILOL 3.125 MG PO TABS
3.1250 mg | ORAL_TABLET | Freq: Every day | ORAL | Status: DC
  Filled 2013-07-04: qty 1

## 2013-07-04 NOTE — Progress Notes (Signed)
Hypoglycemic Event  CBG: 45  Treatment:  8oz Orange Juice   Symptoms: Asymptomatic  Follow-up CBG: [id number] CBG Result: 68  Possible Reasons for Event: unknown  Comments/MD notified: hypoglycemic protocol followed    Kendra Opitz  Remember to initiate Hypoglycemia Order Set & complete

## 2013-07-04 NOTE — Progress Notes (Signed)
Subjective: Interval History: none.. had some hypotension with the blood pressure in the 80-90 range. Responded to volume.  Objective: Vital signs in last 24 hours: Temp:  [97.8 F (36.6 C)-98.9 F (37.2 C)] 97.9 F (36.6 C) (01/25 0746) Pulse Rate:  [68-141] 96 (01/25 0800) Resp:  [10-19] 11 (01/25 0800) BP: (77-108)/(45-89) 105/61 mmHg (01/25 0800) SpO2:  [95 %-100 %] 99 % (01/25 0800) Arterial Line BP: (75-132)/(42-88) 84/73 mmHg (01/25 0800) Weight:  [125 lb 10.6 oz (57 kg)] 125 lb 10.6 oz (57 kg) (01/25 0600)  Intake/Output from previous day: 01/24 0701 - 01/25 0700 In: 2282.5 [P.O.:120; I.V.:2112.5; IV Piggyback:50] Out: 675 [Urine:675] Intake/Output this shift: Total I/O In: 239.5 [I.V.:89.5; IV Piggyback:150] Out: 35 [Urine:35]  Left groin incision healing. Left AKA dressing intact.  Lab Results:  Recent Labs  07/02/13 0215 07/03/13 0800  WBC 11.0* 11.5*  HGB 11.0* 10.0*  HCT 32.7* 28.9*  PLT 120* 142*   BMET  Recent Labs  07/03/13 0420 07/04/13 0400  NA 141 144  K 3.3* 2.7*  CL 111 117*  CO2 19 18*  GLUCOSE 106* 53*  BUN 22 20  CREATININE 1.01 0.94  CALCIUM 6.8* 6.4*    Studies/Results: Dg Chest Port 1 View  07/02/2013   CLINICAL DATA:  Endotracheal tube placement and left IJ line placement.  EXAM: PORTABLE CHEST - 1 VIEW  COMPARISON:  Chest radiograph performed 02/13/2012  FINDINGS: The patient's endotracheal tube is seen ending 3-4 cm above the carina. A left IJ line is seen ending about the mid SVC. An enteric tube is noted extending below the diaphragm.  The lungs are mildly hypoexpanded. Mild right-sided airspace opacity may reflect atelectasis or possibly mild pneumonia. The left lung appears clear. No pneumothorax is identified.  The cardiomediastinal silhouette is borderline normal in size. The patient is status post median sternotomy, with evidence of prior CABG. No acute osseous abnormalities are seen.  IMPRESSION: 1. Endotracheal tube seen  ending 3-4 cm above the carina. 2. Left IJ line seen ending about the mid SVC. 3. Lungs mildly hypoexpanded. Mild right-sided airspace opacity may reflect atelectasis or possibly mild pneumonia.   Electronically Signed   By: Garald Balding M.D.   On: 07/02/2013 02:38   Anti-infectives: Anti-infectives   None      Assessment/Plan: s/p Procedure(s) with comments: THROMBECTOMY FEMORAL ARTERY Left leg (Left) - Thrombectomy of Left femoral artery with left femoral endarterectomy with left above the knee amputation. AMPUTATION ABOVE KNEE (Left) Hemodynamically stable. Replace potassium.   LOS: 3 days   [name] 07/04/2013, 8:33 AM

## 2013-07-04 NOTE — Progress Notes (Signed)
CRITICAL VALUE ALERT  Critical value received: K 2.7, Calcium 6.4  Date of notification: 07-04-13   Time of notification: 0515  Critical value read back: yes  Nurse who received alert:  Patricia Nettle  MD notified (1st page):  MD Early  Time of first page:  0530  MD notified (2nd page):  Time of second page:  Responding MD:  MD Early  Time MD responded:  80  New orders received at this time, will continue to monitor the patient closely.

## 2013-07-04 NOTE — Progress Notes (Signed)
Dr Early updated pt status. MD updated urine output marginal, <30 output/hr. MD updated total UOP for shift and total IV intake. NS @ 75cc/hr. sbp range 90-110. Updated total of 24mEq KCL (K 2.7) ordered IV and Ca IV replacement. Will continue to monitor. Reather Laurence

## 2013-07-04 NOTE — Progress Notes (Signed)
PULMONARY / CRITICAL CARE MEDICINE  Name: [name] MRN: [id number] DOB: 05/27/1932    ADMISSION DATE:  07/01/2013 CONSULTATION DATE:  07/02/12  REFERRING MD :  Vascular  CHIEF COMPLAINT:  Ventilator management  BRIEF PATIENT DESCRIPTION: 80 yo with CAD, AF, HLD, DM II, HTN, PAD, and prostate CA s/p prostatectomy, presened to the ED with complaints of left leg pain, admitted for iliofemoral thromboembolism, now s/p AKA.  SIGNIFICANT EVENTS / STUDIES:  1/23  OR >>> Left AKA  LINES / TUBES: OETT 1/23 >>> 1/23 LIJ CVL 1/23 >>> L radial a-line 1/23 >>> Suprapubic catheter >>>  CULTURES:  ANTIBIOTICS: Vancomycin 1/23 x 1  SUBJECTIVE:  Hypo K and hypo Ca this am  VITAL SIGNS: Temp:  [97.8 F (36.6 C)-98.9 F (37.2 C)] 97.9 F (36.6 C) (01/25 0746) Pulse Rate:  [68-141] 96 (01/25 0800) Resp:  [10-19] 11 (01/25 0800) BP: (77-108)/(45-89) 105/61 mmHg (01/25 0800) SpO2:  [95 %-100 %] 99 % (01/25 0800) Arterial Line BP: (75-132)/(42-88) 84/73 mmHg (01/25 0800) Weight:  [57 kg (125 lb 10.6 oz)] 57 kg (125 lb 10.6 oz) (01/25 0600)  VENTILATOR SETTINGS:    INTAKE / OUTPUT: Intake/Output     01/24 0701 - 01/25 0700 01/25 0701 - 01/26 0700   P.O. 120    I.V. (mL/kg) 2112.5 (37.1) 89.5 (1.6)   IV Piggyback 50 150   Total Intake(mL/kg) 2282.5 (40) 239.5 (4.2)   Urine (mL/kg/hr) 675 (0.5) 35 (0.4)   Drains     Stool     Total Output 675 35   Net +1607.5 +204.5        Stool Occurrence 1 x      PHYSICAL EXAMINATION: General: no distress,  Neuro: alert, follows commands, oriented x2 HEENT: OP  Cardiovascular: irregular Lungs:  No wheeze Abdomen:  Soft, non-distended. BS +. Suprapubic catheter in place, no erythema or discharge. Musculoskeletal:  RLE 66 (old), LLE AKA (new). Drain in place.  Skin:  UE ecchymoses. No rashes, or erythema.  LABS:  CBC  Recent Labs Lab 07/01/13 2003  07/02/13 0043 07/02/13 0215 07/03/13 0800  WBC 9.4  --   --  11.0* 11.5*   HGB 7.5*  < > 11.2* 11.0* 10.0*  HCT 22.3*  < > 33.0* 32.7* 28.9*  PLT 168  --   --  120* 142*  < > = values in this interval not displayed.  Coag's  Recent Labs Lab 07/01/13 2003 07/02/13 0215  APTT 42*  --   INR 1.33 1.40   BMET  Recent Labs Lab 07/02/13 1030 07/03/13 0420 07/04/13 0400  NA 139 141 144  K 3.6* 3.3* 2.7*  CL 107 111 117*  CO2 21 19 18*  BUN 26* 22 20  CREATININE 1.00 1.01 0.94  GLUCOSE 120* 106* 53*   Electrolytes  Recent Labs Lab 07/02/13 1030 07/03/13 0420 07/04/13 0400  CALCIUM 6.6* 6.8* 6.4*  MG  --  1.8 1.6   ABG  Recent Labs Lab 07/02/13 0208  PHART 7.426  PCO2ART 37.4  PO2ART 511.0*   Cardiac Enzymes  Recent Labs Lab 07/02/13 0215 07/02/13 0805 07/02/13 1335  TROPONINI <0.30 <0.30 <0.30   Glucose  Recent Labs Lab 07/03/13 2015 07/04/13 0010 07/04/13 0408 07/04/13 0411 07/04/13 0444 07/04/13 0753  GLUCAP 168* 104* 48* 45* 68* 84    CXR:  No results found.   ASSESSMENT / PLAN:  PULMONARY A: Acute resp failure > resolved P:   -bronchial hygiene -oxygen to keep SpO2 >  92% -f/u CXR as needed  CARDIOVASCULAR A:  Hypotension in setting of general anesthesia, positive pressure ventilation and sedation - resolved Left iliofemoral thrombus s/p thrombectomy S/p L AKA  H/o R BKA H/o CAD, HTN, AF, PVD > tachy in bursts on 1/24 P:  -per VVS and cardiology -resume lipitor -bursts of RVR > ordered home coreg dose, stopped scheduled metoprolol IV 1/25  RENAL A:   Hypokalemia Hypocalcemia S/p prostatectomy for prostate CA Chronic suprapubic catheter P:   -replace K and Ca, follow BMP  GASTROINTESTINAL A:   GI Px Nutrition P:   -continue protonix -progress diet as he can tolerate, currently on dysphagia diet  HEMATOLOGIC A:   Anemia. P:  -f/u CBC -heparin gtt per VVS and cardiology  INFECTIOUS A:   No active issues P:   -Monitor clinically  ENDOCRINE A:   DM type II   P:    -SSI -Hold Metformin until good PO intake  NEUROLOGIC A:   Post-op pain control. H/o Dementia. P:  -continue namenda -careful with pain meds  Please call if we can assist   Baltazar Apo, MD, PhD 07/04/2013, 8:23 AM [facility] [phone] or if no answer [phone]

## 2013-07-04 NOTE — Progress Notes (Signed)
In to see patient at this time, patient over past hr has had persistent hypotension. Patient SBP running 75-85/45-55 with maps ranging from 56-58, chronic afib with rates in the 75-80's. MD Early notified at this time, new orders received. Will continue to monitor the patient.

## 2013-07-04 NOTE — Progress Notes (Signed)
Patient ID: [name], male   DOB: [birth year], 80 y.o.   MRN: [id number]     Primary cardiologist:  Subjective:   Leg pain. No palpitations   Objective:   Temp:  [97.8 F (36.6 C)-98.9 F (37.2 C)] 97.9 F (36.6 C) (01/25 0746) Pulse Rate:  [72-141] 112 (01/25 0900) Resp:  [10-20] 20 (01/25 0900) BP: (77-108)/(45-89) 97/76 mmHg (01/25 0900) SpO2:  [95 %-100 %] 99 % (01/25 0900) Arterial Line BP: (75-132)/(52-88) 84/73 mmHg (01/25 0800) Weight:  [125 lb 10.6 oz (57 kg)] 125 lb 10.6 oz (57 kg) (01/25 0600) Last BM Date: 07/03/13  Filed Weights   07/02/13 0200 07/04/13 0600  Weight: 125 lb 11.2 oz (57.017 kg) 125 lb 10.6 oz (57 kg)    Intake/Output Summary (Last 24 hours) at 07/04/13 1003 Last data filed at 07/04/13 0800  Gross per 24 hour  Intake   2292 ml  Output    590 ml  Net   1702 ml    Telemetry: afib, rates 130s  Exam:  General: NAD  Resp: CTAB  Cardiac: irreg, tachycardic, no m/r/g  GI: abdomen soft, NT, ND  Neuro: no focal deficits   Lab Results:  Basic Metabolic Panel:  Recent Labs Lab 07/02/13 1030 07/03/13 0420 07/04/13 0400  NA 139 141 144  K 3.6* 3.3* 2.7*  CL 107 111 117*  CO2 21 19 18*  GLUCOSE 120* 106* 53*  BUN 26* 22 20  CREATININE 1.00 1.01 0.94  CALCIUM 6.6* 6.8* 6.4*  MG  --  1.8 1.6    Liver Function Tests: No results found for this basename: AST, ALT, ALKPHOS, BILITOT, PROT, ALBUMIN,  in the last 168 hours  CBC:  Recent Labs Lab 07/01/13 2003  07/02/13 0043 07/02/13 0215 07/03/13 0800  WBC 9.4  --   --  11.0* 11.5*  HGB 7.5*  < > 11.2* 11.0* 10.0*  HCT 22.3*  < > 33.0* 32.7* 28.9*  MCV 91.4  --   --  88.1 87.0  PLT 168  --   --  120* 142*  < > = values in this interval not displayed.  Cardiac Enzymes:  Recent Labs Lab 07/02/13 0215 07/02/13 0805 07/02/13 1335  TROPONINI <0.30 <0.30 <0.30    BNP: No results found for this basename: PROBNP,  in the last 8760 hours  Coagulation:  Recent  Labs Lab 07/01/13 2003 07/02/13 0215  INR 1.33 1.40    ECG:   Medications:   Scheduled Medications: . atorvastatin  10 mg Oral QHS  . calcium gluconate  1 g Intravenous Once  . carvedilol  3.125 mg Oral Q breakfast  . digoxin  0.125 mg Intravenous Daily  . insulin aspart  0-15 Units Subcutaneous Q4H  . Memantine HCl ER  28 mg Oral Daily  . oxybutynin  5 mg Oral BID  . pantoprazole  40 mg Oral Daily  . potassium chloride  10 mEq Intravenous Q1 Hr x 5  . sodium chloride  10-40 mL Intracatheter Q12H     Infusions: . sodium chloride 75 mL/hr (07/04/13 0806)  . sodium chloride Stopped (07/03/13 0400)  . heparin 1,450 Units/hr (07/04/13 0800)     PRN Medications:  acetaminophen, acetaminophen, hydrALAZINE, HYDROmorphone (DILAUDID) injection, magnesium sulfate 1 - 4 g bolus IVPB, metoprolol, ondansetron, sodium chloride, traMADol     Assessment/Plan   80 yo male with hx of CAD, HL, DM, HTN, PAD, Afib admitted with ischemic left leg.   1. Leg ischemia - s/p  lower extremity endarectomy and embolectomy, followed by vascular - presumed embolic event, patient with history of chronic afib and had been off coumadin per notes because of anemia  2. Afib - rate controlled with coreg, yesterday started on digoxin in the setting of mildly elevated rates. Its been unclear how much his ongoing pain has been related to elevated rates. Soft blood pressure has limited titration of beta blocker.  Appears received 0.25mg  oral dig yesterday, with 0.125mg  IV ordered for today. - heart rates increased this mornign to 130s to 140s. Will change dig order, complete 1 mg IV loading dose (0.25mg  IV q 6 hrs x 3 more doses) then resume 0.125 oral dose tomorrow.   - he is on heparin gtt, continue in ICU setting. Transition to oral anticoag over next few days.          Carlyle Dolly, M.D., F.A.C.C.

## 2013-07-04 NOTE — Progress Notes (Signed)
Dr Early updated at bedside. MD assessed RUE, arm discolored, BP cuff on upper arm, palpable pulse, skin tear to arm. Will d/c aline and move BP cuff to LUE. MD updated AM labs. MD assessed Left leg, dressing intact. MD stated surgeon to change dressing unless dressing soiled. Palpable femoral pulse bilaterally. Will continue to monitor. Reather Laurence

## 2013-07-04 NOTE — Progress Notes (Signed)
ANTICOAGULATION CONSULT NOTE   Pharmacy Consult for Heparin  Indication: s/p left iliofemoral embolectomy with L-AKA, afib  Allergies  Allergen Reactions  . Levaquin [Levofloxacin] Rash  . Morphine And Related Other (See Comments)    Confusion, agitation  . Codeine Nausea And Vomiting    Noted on MAR  . Penicillins Rash    Noted on MAR    Labs:  Recent Labs  07/01/13 2003  07/02/13 0043 07/02/13 0215 07/02/13 0805 07/02/13 1030 07/02/13 1335  07/03/13 0420 07/03/13 0800 07/03/13 1635 07/04/13 0400  HGB 7.5*  < > 11.2* 11.0*  --   --   --   --   --  10.0*  --   --   HCT 22.3*  < > 33.0* 32.7*  --   --   --   --   --  28.9*  --   --   PLT 168  --   --  120*  --   --   --   --   --  142*  --   --   APTT 42*  --   --   --   --   --   --   --   --   --   --   --   LABPROT 16.2*  --   --  16.8*  --   --   --   --   --   --   --   --   INR 1.33  --   --  1.40  --   --   --   --   --   --   --   --   HEPARINUNFRC  --   --   --   --   --   --   --   < >  --  0.16* 0.19* 0.35  CREATININE 1.14  --   --  1.00  --  1.00  --   --  1.01  --   --  0.94  TROPONINI  --   --   --  <0.30 <0.30  --  <0.30  --   --   --   --   --   < > = values in this interval not displayed.  Assessment: 80 yo M s/p left iliofemoral embolectomy with L-AKA, h/o afib, on heparin per pharmacy. Lovenox PTA, last dose likely 1/22 in the AM.  Heparin now therapeutic at 0.35  Goal of Therapy:  Heparin level 0.3-0.7 units/ml Monitor platelets by anticoagulation protocol: Yes   Plan:  - Continue heparin gtt at 1450 units / hr - Follow up AM labs  Thank you. Anette Guarneri, PharmD [phone]  07/04/2013 10:45 AM

## 2013-07-05 ENCOUNTER — Encounter: Payer: Medicare Other | Admitting: Surgery

## 2013-07-05 ENCOUNTER — Other Ambulatory Visit (HOSPITAL_COMMUNITY): Payer: Medicare Other

## 2013-07-05 LAB — TYPE AND SCREEN
ABO/RH(D): O POS
ANTIBODY SCREEN: NEGATIVE
Unit division: 0
Unit division: 0
Unit division: 0
Unit division: 0
Unit division: 0
Unit division: 0

## 2013-07-05 LAB — GLUCOSE, CAPILLARY
GLUCOSE-CAPILLARY: 78 mg/dL (ref 70–99)
GLUCOSE-CAPILLARY: 82 mg/dL (ref 70–99)
GLUCOSE-CAPILLARY: 92 mg/dL (ref 70–99)
Glucose-Capillary: 88 mg/dL (ref 70–99)
Glucose-Capillary: 102 mg/dL — ABNORMAL HIGH (ref 70–99)

## 2013-07-05 LAB — HEPARIN LEVEL (UNFRACTIONATED): Heparin Unfractionated: 0.34 IU/mL (ref 0.30–0.70)

## 2013-07-05 LAB — CBC
HCT: 28.6 % — ABNORMAL LOW (ref 39.0–52.0)
Hemoglobin: 9.6 g/dL — ABNORMAL LOW (ref 13.0–17.0)
MCH: 30.5 pg (ref 26.0–34.0)
MCHC: 33.6 g/dL (ref 30.0–36.0)
MCV: 90.8 fL (ref 78.0–100.0)
Platelets: 181 10*3/uL (ref 150–400)
RBC: 3.15 MIL/uL — ABNORMAL LOW (ref 4.22–5.81)
RDW: 16.9 % — AB (ref 11.5–15.5)
WBC: 9.2 10*3/uL (ref 4.0–10.5)

## 2013-07-05 LAB — PREPARE RBC (CROSSMATCH)

## 2013-07-05 MED ORDER — ENSURE PUDDING PO PUDG
1.0000 | Freq: Three times a day (TID) | ORAL | Status: DC
  Administered 2013-07-05 – 2013-07-07 (×6): 1 via ORAL

## 2013-07-05 MED ORDER — CHLORHEXIDINE GLUCONATE CLOTH 2 % EX PADS
6.0000 | MEDICATED_PAD | Freq: Every day | CUTANEOUS | Status: DC
  Administered 2013-07-06 – 2013-07-07 (×2): 6 via TOPICAL

## 2013-07-05 MED ORDER — ENOXAPARIN SODIUM 60 MG/0.6ML ~~LOC~~ SOLN
55.0000 mg | Freq: Two times a day (BID) | SUBCUTANEOUS | Status: DC
  Administered 2013-07-05 – 2013-07-06 (×4): 55 mg via SUBCUTANEOUS
  Filled 2013-07-05 (×8): qty 0.6

## 2013-07-05 MED ORDER — WARFARIN SODIUM 5 MG PO TABS
5.0000 mg | ORAL_TABLET | Freq: Once | ORAL | Status: AC
  Administered 2013-07-05: 5 mg via ORAL
  Filled 2013-07-05: qty 1

## 2013-07-05 MED ORDER — COLLAGENASE 250 UNIT/GM EX OINT
TOPICAL_OINTMENT | Freq: Every day | CUTANEOUS | Status: DC
  Administered 2013-07-06 – 2013-07-07 (×2): via TOPICAL
  Filled 2013-07-05: qty 30

## 2013-07-05 MED ORDER — WARFARIN - PHARMACIST DOSING INPATIENT: Freq: Every day | Status: DC

## 2013-07-05 NOTE — Evaluation (Signed)
Occupational Therapy Evaluation Patient Details Name: [name] MRN: [id number] DOB: [birth year] Today's Date: 07/05/2013 Time: 6644-0347 OT Time Calculation (min): 28 min  OT Assessment / Plan / Recommendation History of present illness pt admitted with left leg pain and ischemia, s/p L iliofemoral endarterectomy, thromboemboectomy and L AKA.   Clinical Impression   Pt is a resident of a SNF where he is dependent in ADL and mobility.  No acute OT needs.  Recommend return to SNF.  Signing off.   OT Assessment  Patient does not need any further OT services    Follow Up Recommendations  SNF    Barriers to Discharge      Equipment Recommendations  None recommended by OT    Recommendations for Other Services    Frequency       Precautions / Restrictions Precautions Precautions: Fall Restrictions Other Position/Activity Restrictions: B LE amputations   Pertinent Vitals/Pain L LE with movement, did not rate, repositioned, RN aware    ADL  Eating/Feeding: Moderate assistance Where Assessed - Eating/Feeding: Chair Grooming: Maximal assistance Where Assessed - Grooming: Supine, head of bed up Upper Body Bathing: +1 Total assistance Where Assessed - Upper Body Bathing: Supine, head of bed up;Rolling right and/or left Lower Body Bathing: +1 Total assistance Where Assessed - Lower Body Bathing: Supine, head of bed up;Rolling right and/or left Upper Body Dressing: +1 Total assistance Where Assessed - Upper Body Dressing: Supine, head of bed up Lower Body Dressing: +1 Total assistance Where Assessed - Lower Body Dressing: Supine, head of bed up;Rolling right and/or left Toileting - Clothing Manipulation and Hygiene: +2 Total assistance;Maximal assistance Where Assessed - Toileting Clothing Manipulation and Hygiene: Rolling right and/or left Equipment Used:  (maxi sky) Transfers/Ambulation Related to ADLs: transferred chair to bed with maxi sky    OT Diagnosis:    OT Problem  List:   OT Treatment Interventions:     OT Goals(Current goals can be found in the care plan section) Acute Rehab OT Goals Patient Stated Goal: did not state  Visit Information  Last OT Received On: 07/05/13 Assistance Needed: +2 History of Present Illness: pt admitted with left leg pain and ischemia, s/p L iliofemoral endarterectomy, thromboemboectomy and L AKA.       Prior [place] expects to be discharged to:: Skilled nursing facility Additional Comments: pt is a resident of [facility] Prior Function Level of Independence: Needs assistance Gait / Transfers Assistance Needed: not applicable ADL's / Homemaking Assistance Needed: dependent in adls Comments: lifted to w/c where he w/c himself around independently Communication Communication: No difficulties Dominant Hand: Right         Vision/Perception Vision - History Baseline Vision: Wears glasses all the time   Cognition  Cognition Arousal/Alertness: Awake/alert Behavior During Therapy: Flat affect Overall Cognitive Status: History of cognitive impairments - at baseline Memory: Decreased short-term memory    Extremity/Trunk Assessment Upper Extremity Assessment Upper Extremity Assessment: Generalized weakness Lower Extremity Assessment Lower Extremity Assessment: Defer to PT evaluation     Mobility Bed Mobility Overal bed mobility: Needs Assistance;+ 2 for safety/equipment;+2 for physical assistance Bed Mobility: Rolling Rolling: Max assist;+2 for physical assistance General bed mobility comments: used pad to roll pt, pt used rail to pull     Exercise     Balance     End of Session OT - End of Session Activity Tolerance: Patient tolerated treatment well Patient left: in bed;with call bell/phone within reach;with nursing/sitter in  room  GO     Malka So 07/05/2013, 11:54 AM [phone]

## 2013-07-05 NOTE — Progress Notes (Signed)
ANTICOAGULATION CONSULT NOTE - Initial Consult  Pharmacy Consult for warfarin + lovenox Indication: atrial fibrillation  Allergies  Allergen Reactions  . Levaquin [Levofloxacin] Rash  . Morphine And Related Other (See Comments)    Confusion, agitation  . Codeine Nausea And Vomiting    Noted on MAR  . Penicillins Rash    Noted on [facility]    Patient Measurements: Height: 5\' 6"  (167.6 cm) Weight: 126 lb 1.7 oz (57.2 kg) IBW/kg (Calculated) : 63.8  Vital Signs: Temp: 98 F (36.7 C) (01/26 0800) Temp src: Oral (01/26 0800) BP: 115/64 mmHg (01/26 0900) Pulse Rate: 87 (01/26 0900)  Labs:  Recent Labs  07/02/13 1335  07/03/13 0420 07/03/13 0800 07/03/13 1635 07/04/13 0400 07/04/13 1723 07/05/13 0319  HGB  --   --   --  10.0*  --   --   --  9.6*  HCT  --   --   --  28.9*  --   --   --  28.6*  PLT  --   --   --  142*  --   --   --  181  HEPARINUNFRC  --   < >  --  0.16* 0.19* 0.35  --  0.34  CREATININE  --   --  1.01  --   --  0.94 1.01  --   TROPONINI <0.30  --   --   --   --   --   --   --   < > = values in this interval not displayed.  Estimated Creatinine Clearance: 48 ml/min (by C-G formula based on Cr of 1.01).  Assessment: 86 yom s/p L iliofemoral embolectomy with L-aka initially started on IV heparin gtt. Now transitioning back to lovenox and restarted coumadin. Appears patient has also been on coumadin in the past but was stopped d/t worsening anemia. H/H 9.6/28.6, plts 181. No bleeding noted. Last INR was 1.4.   Goal of Therapy:  INR 2-3 Anti-Xa level 0.6-1 units/ml 4hrs after LMWH dose given Monitor platelets by anticoagulation protocol: Yes   Plan:  1. Warfarin 5mg  PO x 1 tonight 2. Daily INR 3. Lovenox 55mg  SQ Q12H (start 1 hour after heparin gtt turned off) 4. CBC daily already ordered  Lesleigh Hughson, Rande Lawman 07/05/2013,10:04 AM

## 2013-07-05 NOTE — Clinical Documentation Improvement (Signed)
THIS DOCUMENT IS NOT A PERMANENT PART OF THE MEDICAL RECORD  Please update your documentation with the medical record to reflect your response to this query. If you need help knowing how to do this please call [phone].  07/05/13  Dear Dr. Bridgett Larsson Rolley Sims  In an effort to better capture your patient's severity of illness, reflect appropriate length of stay and utilization of resources, a review of the patient medical record has revealed the following indicators.    Based on your clinical judgment, please clarify and document in a progress note and/or discharge summary the clinical condition associated with the following supporting information:  In responding to this query please exercise your independent judgment.  The fact that a query is asked, does not imply that any particular answer is desired or expected.   Possible Clinical Conditions?   " Expected Acute Blood Loss Anemia  " Acute Blood Loss Anemia  " Acute on chronic blood loss anemia  " Precipitous drop in Hematocrit  " Other Condition  " Cannot Clinically Determine   Risk Factors: EBL: 775 ml per 01/22 Anesthesia record. Anemia, adequate resuscitation with 4 u prbc's per 01/23 progress notes.  Diagnostics: H&H on 01/22:   7.5/22.3 H&H on 01/23:  11.0/32.7  IV fluids / plasma expanders: Per 01/22 Anesthesia record: Prbc's: 1300 ml. LR:  1400 ml.   Reviewed:  no additional documentation provided  Thank You,  Theron Arista, Clinical Documentation Specialist: [place]

## 2013-07-05 NOTE — Progress Notes (Addendum)
   Daily Progress Note  Assessment/Planning: POD #4 s/p L iliofem TE and EA, L AKA, expected acute blood loss related to operation, multiple co-morbidities CAD, demntia, afib, DM with complications, and dysphagia    BP ok overnight, would given another 1 u PRBC for pt's history of CAD and soft BP  Convert to Lovenox as bridge back to Coumadin  No overt bleed yet  PT/OT eval: suspect pt is a SNF placement  Subjective  - 4 Days Post-Op  C/o pain  Objective Filed Vitals:   07/05/13 0500 07/05/13 0600 07/05/13 0700 07/05/13 0800  BP: 117/65 119/59 110/67   Pulse: 88 97 101   Temp:    98 F (36.7 C)  TempSrc:    Oral  Resp: 18 20 14    Height:      Weight:  126 lb 1.7 oz (57.2 kg)    SpO2: 98% 97% 96%     Intake/Output Summary (Last 24 hours) at 07/05/13 0937 Last data filed at 07/05/13 0800  Gross per 24 hour  Intake 2468.5 ml  Output    746 ml  Net 1722.5 ml    PULM  CTAB CV  Irr, irr GI  soft, NTND VASC  L groin inc c/d/i, no groin hematoma, JP out, staples intact, L AKA bandaged without active bleeding  Laboratory CBC    Component Value Date/Time   WBC 9.2 07/05/2013 0319   HGB 9.6* 07/05/2013 0319   HCT 28.6* 07/05/2013 0319   PLT 181 07/05/2013 0319    BMET    Component Value Date/Time   NA 141 07/04/2013 1723   K 4.4 07/04/2013 1723   CL 115* 07/04/2013 1723   CO2 18* 07/04/2013 1723   GLUCOSE 100* 07/04/2013 1723   BUN 19 07/04/2013 1723   CREATININE 1.01 07/04/2013 1723   CREATININE 0.96 04/03/2011 0857   CALCIUM 6.8* 07/04/2013 1723   GFRNONAA 69* 07/04/2013 1723   GFRAA 79* 07/04/2013 1723    Adele Barthel, MD [facility]: [phone] Pager: [phone]  07/05/2013, 9:37 AM

## 2013-07-05 NOTE — Consult Note (Signed)
WOC wound consult note Reason for Consult: evaluation of pressure ulcer Wound type: Stage IV pressure ulcer Pressure Ulcer POA: Yes/No Measurement: 4.0cm x 3.0cm x 0.5cm Wound bed:75% yellow slough, and 25% early granulation tissue, palpable bone centrally.  Drainage (amount, consistency, odor) minimal, non foul Periwound: two smaller areas distally towards the rectum, superficial areas probably related to moisture Dressing procedure/placement/frequency: Will add enzymatic debridement ointment to clean up the wound bed, cover with dry dressing.  Add air mattress at the time of discharge from the ICU.  Chair pressure redistribution pad ordered for when this patient is up in the chair (to be taken with him for use at the SNF).   Discussed POC with patient and bedside nurse.  Re consult if needed, will not follow at this time. Thanks  Neita Landrigan [place], [place] [phone])

## 2013-07-05 NOTE — Progress Notes (Signed)
Subjective: No CP or SOB  Objective: Vital signs in last 24 hours: Temp:  [98 F (36.7 C)-98.5 F (36.9 C)] 98 F (36.7 C) (01/26 0800) Pulse Rate:  [77-113] 80 (01/26 1100) Resp:  [10-21] 17 (01/26 1100) BP: (90-120)/(49-84) 119/57 mmHg (01/26 1100) SpO2:  [95 %-99 %] 96 % (01/26 1100) Weight:  [126 lb 1.7 oz (57.2 kg)] 126 lb 1.7 oz (57.2 kg) (01/26 0600) Last BM Date: 07/05/13  Intake/Output from previous day: 01/25 0701 - 01/26 0700 In: 2818 [P.O.:160; I.V.:2148; IV Piggyback:510] Out: 27 [Urine:810; Stool:1] Intake/Output this shift: Total I/O In: 427.7 [I.V.:427.7] Out: 190 [Urine:190]  Medications Current Facility-Administered Medications  Medication Dose Route Frequency Provider Last Rate Last Dose  . 0.9 %  sodium chloride infusion   Intravenous Continuous Sean [place], [name] 75 mL/hr at 07/04/13 0806 75 mL/hr at 07/04/13 0806  . 0.9 %  sodium chloride infusion   Intravenous Continuous Sean Byers, [name]      . acetaminophen (TYLENOL) tablet 325-650 mg  325-650 mg Oral Q4H PRN Sean [place], [name]       Or  . acetaminophen (TYLENOL) suppository 325-650 mg  325-650 mg Rectal Q4H PRN Sean [place], [name]      . atorvastatin (LIPITOR) tablet 10 mg  10 mg Oral QHS Sean Gobble, [name]   10 mg at 07/04/13 2230  . carvedilol (COREG) tablet 3.125 mg  3.125 mg Oral Q breakfast Sean Gobble, [name]   3.125 mg at 07/05/13 7425  . [START ON 07/06/2013] collagenase (SANTYL) ointment   Topical Daily Sean Grundy, [name]      . digoxin [facility]) tablet 0.125 mg  0.125 mg Oral Daily Sean Lenis, [name]   0.125 mg at 07/05/13 1038  . enoxaparin (LOVENOX) injection 55 mg  55 mg Subcutaneous Q12H [name], [facility]      . feeding supplement (ENSURE) (ENSURE) pudding 1 Container  1 Container Oral TID BM [name], [name]      . hydrALAZINE (APRESOLINE) injection 10 mg  10 mg Intravenous Q2H PRN Sean [place], [name]      . HYDROmorphone (DILAUDID) injection 0.5-1 mg  0.5-1 mg Intravenous  Q2H PRN Sean Posner, [name]   0.5 mg at 07/04/13 1449  . insulin aspart (novoLOG) injection 0-15 Units  0-15 Units Subcutaneous Q4H Sean Brooks, [name]   3 Units at 07/03/13 2030  . magnesium sulfate IVPB 2 g 50 mL  2 g Intravenous Daily PRN Sean [place], [name]      . Memantine HCl ER CP24 28 mg  28 mg Oral Daily Sean Gobble, [name]   28 mg at 07/05/13 1037  . metoprolol (LOPRESSOR) injection 2-5 mg  2-5 mg Intravenous Q2H PRN Sean Eldred, [name]   2.5 mg at 07/03/13 1623  . ondansetron (ZOFRAN) injection 4 mg  4 mg Intravenous Q6H PRN Sean Thatcher, [name]   4 mg at 07/03/13 0414  . oxybutynin (DITROPAN) tablet 5 mg  5 mg Oral BID Sean [place], [name]   5 mg at 07/05/13 1038  . pantoprazole (PROTONIX) EC tablet 40 mg  40 mg Oral Daily Sean Gobble, [name]   40 mg at 07/05/13 1055  . sodium chloride 0.9 % injection 10-40 mL  10-40 mL Intracatheter Q12H Sean [place], [name]   20 mL at 07/05/13 1038  . sodium chloride 0.9 % injection 10-40 mL  10-40 mL Intracatheter PRN [name]  Starlyn Skeans, [name]      . traMADol Veatrice Bourbon) tablet 50 mg  50 mg Oral Q4H PRN Sean Posner, [name]   50 mg at 07/03/13 1600  . warfarin (COUMADIN) tablet 5 mg  5 mg Oral ONCE-1800 [name], [facility]      . Warfarin - Pharmacist Dosing Inpatient   Does not apply q1800 [name], [facility]        PE: General appearance: alert, cooperative and no distress Lungs: clear to auscultation bilaterally Heart: irregularly irregular rhythm Abdomen: Tympanic on the left and dull on right.  Abd is tense.  Mildly tender in the right.  +BS Pulses: 2+ left radial.  0 right radial.  Hands warm. Skin: Warm and dry.   Neurologic: Grossly normal  Lab Results:   Recent Labs  07/03/13 0800 07/05/13 0319  WBC 11.5* 9.2  HGB 10.0* 9.6*  HCT 28.9* 28.6*  PLT 142* 181   BMET  Recent Labs  07/03/13 0420 07/04/13 0400 07/04/13 1723  NA 141 144 141  K 3.3* 2.7* 4.4  CL 111 117* 115*  CO2 19 18* 18*  GLUCOSE 106* 53* 100*  BUN 22 20 19   CREATININE 1.01  0.94 1.01  CALCIUM 6.8* 6.4* 6.8*   Assessment/Plan   Active Problems:   Thromboembolism of lower extremity artery   Acute respiratory failure   Encephalopathy acute   Ventilator dependence   Malnutrition of moderate degree  Plan:  POD#3-left iliofemoral endarterectomy, left Iliofemoral thromboembolectomy, left above-knee amputation.   Afib on Coreg 3.125 and Digoxin started recently secondary to low BP.  On Lovenox BID and coumadin bridging.  HR controlled.  BP looks good.  Getting another unit of PRBCs.      LOS: 4 days    [name] 07/05/2013 12:58 PM  I have personally seen and examined this patient with Sean Fuller, [name] I agree with the assessment and plan as outlined above. Atrial fib, rate controlled. BP stable.   Marijane Trower 07/05/2013 3:29 PM

## 2013-07-05 NOTE — Progress Notes (Signed)
NUTRITION FOLLOW UP  DOCUMENTATION CODES Per approved criteria  -Non-severe (moderate) malnutrition in the context of chronic illness   INTERVENTION: Ensure Pudding po TID, each supplement provides 170 kcal and 4 grams of protein RD to follow for nutrition care plan  NUTRITION DIAGNOSIS: Increased nutrient needs related to post-op healing as evidenced by estimated nutrition needs, ongoing  Goal: Pt to meet >/= 90% of their estimated nutrition needs, progressing  Monitor:  PO & supplemental intake, weight, labs, I/O's  ASSESSMENT: Patient with PMH of CAD, DM II, HTN, PAD and prostate CA s/p prostatectomy, presened to the ED with complaints of left leg pain; admitted for iliofemoral thromboembolism.  Patient s/p procedures 1/23: LEFT ILIOFEMORAL ENDARTERECTOMY LEFT ILIOFEMORAL THROMBOEMBOLECTOMY LEFT ABOVE-THE-KNEE AMPUTATION  Patient s/p bedside swallow evaluation 1/24.  Diet advanced to Dys 2--nectar thick liquids.  Working with OT and RN upon RD visit.  PO intake poor at 15% per flowsheet records.  Patient with increased kcal, protein needs given post-op state and would presence.  RD to order nutrition supplements at this time.  Disposition: likely back to SNF (from [facility]).  Height: Ht Readings from Last 1 Encounters:  07/02/13 5\' 6"  (1.676 m)    Weight: Wt Readings from Last 1 Encounters:  07/05/13 126 lb 1.7 oz (57.2 kg)    Re-estimated Needs: Kcal: 1800-2000 Protein: 90-100 gm Fluid: 1.8-2.0 L  Skin: Stage III pressure ulcer to sacrum  Diet Order: Dysphagia 2, nectar thick liquids   Intake/Output Summary (Last 24 hours) at 07/05/13 1107 Last data filed at 07/05/13 0900  Gross per 24 hour  Intake   2219 ml  Output    696 ml  Net   1523 ml    Labs:   Recent Labs Lab 07/03/13 0420 07/04/13 0400 07/04/13 1723  NA 141 144 141  K 3.3* 2.7* 4.4  CL 111 117* 115*  CO2 19 18* 18*  BUN 22 20 19   CREATININE 1.01 0.94 1.01  CALCIUM 6.8*  6.4* 6.8*  MG 1.8 1.6  --   GLUCOSE 106* 53* 100*    CBG (last 3)   Recent Labs  07/04/13 1938 07/04/13 2335 07/05/13 0813  GLUCAP 93 92 78    Scheduled Meds: . atorvastatin  10 mg Oral QHS  . carvedilol  3.125 mg Oral Q breakfast  . digoxin  0.125 mg Oral Daily  . enoxaparin (LOVENOX) injection  55 mg Subcutaneous Q12H  . insulin aspart  0-15 Units Subcutaneous Q4H  . Memantine HCl ER  28 mg Oral Daily  . oxybutynin  5 mg Oral BID  . pantoprazole  40 mg Oral Daily  . sodium chloride  10-40 mL Intracatheter Q12H  . warfarin  5 mg Oral ONCE-1800  . Warfarin - Pharmacist Dosing Inpatient   Does not apply q1800    Continuous Infusions: . sodium chloride 75 mL/hr (07/04/13 0806)  . sodium chloride Stopped (07/03/13 0400)    Past Medical History  Diagnosis Date  . CAD (coronary artery disease)   . Hyperlipidemia   . Diabetes mellitus   . Hypertension   . Suprapubic catheter   . Peripheral vascular disease   . Arthritis   . Myocardial infarction   . Cancer     prostate cancer  . A-fib     Past Surgical History  Procedure Laterality Date  . Prostatectomy      radiation therapy  . Bladder cath    . Laminectomy      lumbar  .  Lumbar disc surgery    . 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   Marland Kitchen Umbilical hernia repair  1610'R, 6045    umbilical hernia repair in 4098J, super umbilical hernia repaired in 2006  . Coronary artery bypass graft      2008  . Total hip arthroplasty      RT  . Cataract extraction  2012    Left 03/2011, Right 04/2011  . Amputation  12/03/2011    Procedure: AMPUTATION BELOW KNEE;  Surgeon: Elam Dutch, MD;  Location: [facility] OR;  Service: Vascular;  Laterality: Right;  . Esophagogastroduodenoscopy  02/12/2012    Procedure: ESOPHAGOGASTRODUODENOSCOPY (EGD);  Surgeon: Milus Banister, MD;  Location: [place];  Service: Endoscopy;  Laterality: N/A;  . Peg placement  02/18/2012    Procedure: PERCUTANEOUS  ENDOSCOPIC GASTROSTOMY (PEG) PLACEMENT;  Surgeon: Inda Castle, MD;  Location: [place];  Service: Endoscopy;  Laterality: N/A;    Arthur Holms, RD, LDN Pager #: [phone] After-Hours Pager #: [phone]

## 2013-07-05 NOTE — Progress Notes (Signed)
Speech Language Pathology Treatment: Dysphagia  Patient Details Name: [name] MRN: [id number] DOB: 1931/12/27 Today's Date: 07/05/2013 Time: 8127-5170 SLP Time Calculation (min): 42 min  Assessment / Plan / Recommendation Clinical Impression  Oral care provided, including cleaning of dentures.  Placed dentures, however, they are extremely loose, as pt. has lost a lot of weight (per daughter).  Daughter agrees to bring in denture adhesive.  Observed patient eating lunch.  Frequent cough noted after swallowing nectar thick liquids and after bites of the "magic cup."  Pt. Did not eat much as he was unable to chew the meats on a chopped diet, and does not like mashed potatoes or green beans, both of which he had on his tray today.  Recommend a MBS for objective evaluation of swallow function.  MD, please order if you agree. Will downgrade diet to Dys 1 with honey thick liquids.   HPI HPI: 80 y.o. (12-30-1931) male known history of PAD requiring prior R BKA, DM, CAD, MI, prostate CA who presents with chief complaint: left leg pain. Pt's memory is somewhat limited so history obtained from patient and family. Onset this AM per patient, but per family definitely ~1 pm, pt's leg was found to be painful and pale. The patient notes mild-moderate pain in left foot, worse with movement of the foot or manipulation with "painful" character.  Underwent Left above-knee amputation 1/23.  Extensive dysphagia history includes MBS 10/23/11 with penetration of thin with barium pill, recommended Dys 3 and thin and suspected primary esophageal dysphagia with recommendation for esophageal f/u.Marland Kitchen  MBS 02/13/12 revealed aspiration prior to swallow with thin recommended Dy3, nectar liquids with water protocol.  EGD 02/12/12 found pooling of secretions in distal esophagus and possible achalasia with consideration of botox injections.  CXR 07/02/13/ revealed lungs mildly hypoexpanded. Mild right-sided airspace opacity may reflect  atelectasis or possibly mild pneumonia.    Pertinent Vitals Afebrile; LS clear/diminished  SLP Plan  MBS    Recommendations Diet recommendations: Dysphagia 1 (puree);Honey-thick liquid Liquids provided via: Cup Medication Administration: Whole meds with puree Supervision: Staff to assist with self feeding;Full supervision/cueing for compensatory strategies Compensations: Slow rate;Small sips/bites;Follow solids with liquid Postural Changes and/or Swallow Maneuvers: Seated upright 90 degrees;Upright 30-60 min after meal              Oral Care Recommendations: Oral care before and after PO Follow up Recommendations: Skilled Nursing facility Plan: MBS    GO     Quinn Axe T 07/05/2013, 2:48 PM

## 2013-07-06 ENCOUNTER — Inpatient Hospital Stay (HOSPITAL_COMMUNITY): Payer: PRIVATE HEALTH INSURANCE

## 2013-07-06 ENCOUNTER — Encounter (HOSPITAL_COMMUNITY): Payer: Self-pay | Admitting: Vascular Surgery

## 2013-07-06 LAB — BASIC METABOLIC PANEL
BUN: 17 mg/dL (ref 6–23)
CHLORIDE: 119 meq/L — AB (ref 96–112)
CO2: 18 mEq/L — ABNORMAL LOW (ref 19–32)
Calcium: 6.8 mg/dL — ABNORMAL LOW (ref 8.4–10.5)
Creatinine, Ser: 1.04 mg/dL (ref 0.50–1.35)
GFR calc non Af Amer: 66 mL/min — ABNORMAL LOW (ref >90)
GFR, EST AFRICAN AMERICAN: 77 mL/min — AB (ref >90)
Glucose, Bld: 93 mg/dL (ref 70–99)
Potassium: 3.7 mEq/L (ref 3.7–5.3)
Sodium: 144 mEq/L (ref 137–147)

## 2013-07-06 LAB — GLUCOSE, CAPILLARY
GLUCOSE-CAPILLARY: 82 mg/dL (ref 70–99)
GLUCOSE-CAPILLARY: 101 mg/dL — AB (ref 70–99)
GLUCOSE-CAPILLARY: 83 mg/dL (ref 70–99)
Glucose-Capillary: 80 mg/dL (ref 70–99)
Glucose-Capillary: 92 mg/dL (ref 70–99)
Glucose-Capillary: 92 mg/dL (ref 70–99)

## 2013-07-06 LAB — PROTIME-INR
INR: 1.83 — AB (ref 0.00–1.49)
Prothrombin Time: 20.6 seconds — ABNORMAL HIGH (ref 11.6–15.2)

## 2013-07-06 LAB — CBC
HEMATOCRIT: 28.9 % — AB (ref 39.0–52.0)
HEMOGLOBIN: 9.9 g/dL — AB (ref 13.0–17.0)
MCH: 31 pg (ref 26.0–34.0)
MCHC: 34.3 g/dL (ref 30.0–36.0)
MCV: 90.6 fL (ref 78.0–100.0)
Platelets: 150 10*3/uL (ref 150–400)
RBC: 3.19 MIL/uL — ABNORMAL LOW (ref 4.22–5.81)
RDW: 16.5 % — ABNORMAL HIGH (ref 11.5–15.5)
WBC: 7.1 10*3/uL (ref 4.0–10.5)

## 2013-07-06 LAB — TYPE AND SCREEN
ABO/RH(D): O POS
Antibody Screen: NEGATIVE
Unit division: 0

## 2013-07-06 MED ORDER — WARFARIN SODIUM 3 MG PO TABS
3.0000 mg | ORAL_TABLET | Freq: Once | ORAL | Status: AC
  Administered 2013-07-06: 3 mg via ORAL
  Filled 2013-07-06: qty 1

## 2013-07-06 NOTE — Progress Notes (Signed)
Pharmacy Note-Anticoagulation  Pharmacy Consult :  80 y.o. male is currently on Coumadin with Lovenox bridging for atrial fibrillation .   Latest Labs : Hematology :  Recent Labs  07/03/13 1635 07/04/13 0400 07/04/13 1723 07/05/13 0319 07/06/13 0430  HGB  --   --   --  9.6* 9.9*  HCT  --   --   --  28.6* 28.9*  PLT  --   --   --  181 150  LABPROT  --   --   --   --  20.6*  INR  --   --   --   --  1.83*  CREATININE  --  0.94 1.01  --  1.04    Lab Results  Component Value Date   INR 1.83* 07/06/2013   INR 1.40 07/02/2013   INR 1.33 07/01/2013    Current Medication[s] Include: Scheduled:  Scheduled:  . atorvastatin  10 mg Oral QHS  . carvedilol  3.125 mg Oral Q breakfast  . Chlorhexidine Gluconate Cloth  6 each Topical Q0600  . collagenase   Topical Daily  . digoxin  0.125 mg Oral Daily  . enoxaparin (LOVENOX) injection  55 mg Subcutaneous Q12H  . feeding supplement (ENSURE)  1 Container Oral TID BM  . insulin aspart  0-15 Units Subcutaneous Q4H  . Memantine HCl ER  28 mg Oral Daily  . oxybutynin  5 mg Oral BID  . pantoprazole  40 mg Oral Daily  . sodium chloride  10-40 mL Intracatheter Q12H  . Warfarin - Pharmacist Dosing Inpatient   Does not apply q1800   Assessment :  Today's INR is trending up following restart.   INR is 1.83.    Lovenox bridging continuing with renal function stable.  No bleeding complications observed.  Goal :  INR goal is 2-3    Lovenox Anti-Xa level 0.6-1 units/ml 4hrs after LMWH dose given  Plan : 1. Lovenox bridging to continue, same dose, 55 mg sq q 12 hr. 2. Coumadin 3 mg po today. 3. Daily INR's, CBC. Monitor for bleeding complications  Estelle June, Pharm.D. 07/06/2013  12:18 PM

## 2013-07-06 NOTE — Progress Notes (Signed)
Orthopedic Tech Progress Note Patient Details:  [name] 1932-02-04 [id number] [employer] contacted for brace order.  Patient ID: [name], male   DOB: Jan 19, 1932, 80 y.o.   MRN: [id number]   Fenton Foy 07/06/2013, 9:25 AM

## 2013-07-06 NOTE — Clinical Social Work Psychosocial (Signed)
Clinical Social Work Department BRIEF PSYCHOSOCIAL ASSESSMENT 07/06/2013  Patient:  [name], [name]     Account Number:  [id number]     Admit date:  07/01/2013  Clinical Social Worker:  Frederico Hamman  Date/Time:  07/06/2013 12:15 PM  Referred by:  Physician  Date Referred:  07/06/2013 Referred for  SNF Placement   Other Referral:   Interview type:  Patient Other interview type:   Patient has a wife, [name] (C) [phone] as well as two daughters [name] (C) [phone] and [name] [phone].    PSYCHOSOCIAL DATA Living Status:  FACILITY Admitted from facility:  [place], [place] Level of care:  [place] Primary support name:  [name] Primary support relationship to patient:  SPOUSE Degree of support available:   Contacted spouse via telephone.    CURRENT CONCERNS Current Concerns  Post-Acute Placement   Other Concerns:    SOCIAL WORK ASSESSMENT / PLAN CSW intern introduced self to patient. Patient was confused and unable to say where he came from. CSW intern asked permission to calling patient's wife and confirming her name and number. CSW intern contacted patient's wife, [name] to discuss discharge plans. [name] confirmed that the patient is from [facility] and will return when ready for discharge.   Assessment/plan status:  Psychosocial Support/Ongoing Assessment of Needs Other assessment/ plan:   Information/referral to community resources:   Going back to SNF.    PATIENT'S/FAMILY'S RESPONSE TO PLAN OF CARE: Patient was content and open to talk with  Publishing rights manager. Patient seem delightful and was watching TV when CSW arrived. CSW contacted patient's wife, [name] who seemed concerned about the patient. [place] expressed that the patient's leg was amputated and she was just waiting for results. She seemed stressed but hopeful. [name] was also aware of patient's returning back to  [place] when ready for discharge.      Wellington Hampshire, CSW Intern.

## 2013-07-06 NOTE — Progress Notes (Signed)
     SUBJECTIVE: No complaints this am.   BP 114/71  Pulse 74  Temp(Src) 96.7 F (35.9 C) (Oral)  Resp 18  Ht 5\' 6"  (1.676 m)  Wt 126 lb 1.7 oz (57.2 kg)  BMI 20.36 kg/m2  SpO2 97%  Intake/Output Summary (Last 24 hours) at 07/06/13 6294 Last data filed at 07/06/13 0444  Gross per 24 hour  Intake 1442.68 ml  Output    815 ml  Net 627.68 ml    PHYSICAL EXAM General: Well developed, well nourished, in no acute distress. Alert and oriented x 3.  Psych:  Flat affect, responds appropriately Neck: No JVD. No masses noted.  Lungs: Clear bilaterally with no wheezes or rhonci noted.  Heart: Irregular irregular with no murmurs noted. Abdomen: Bowel sounds are present. Soft, non-tender.  Extremities: bilateral LE amputations.   LABS: Basic Metabolic Panel:  Recent Labs  07/04/13 0400 07/04/13 1723 07/06/13 0430  NA 144 141 144  K 2.7* 4.4 3.7  CL 117* 115* 119*  CO2 18* 18* 18*  GLUCOSE 53* 100* 93  BUN 20 19 17   CREATININE 0.94 1.01 1.04  CALCIUM 6.4* 6.8* 6.8*  MG 1.6  --   --    CBC:  Recent Labs  07/05/13 0319 07/06/13 0430  WBC 9.2 7.1  HGB 9.6* 9.9*  HCT 28.6* 28.9*  MCV 90.8 90.6  PLT 181 150   Current Meds: . atorvastatin  10 mg Oral QHS  . carvedilol  3.125 mg Oral Q breakfast  . Chlorhexidine Gluconate Cloth  6 each Topical Q0600  . collagenase   Topical Daily  . digoxin  0.125 mg Oral Daily  . enoxaparin (LOVENOX) injection  55 mg Subcutaneous Q12H  . feeding supplement (ENSURE)  1 Container Oral TID BM  . insulin aspart  0-15 Units Subcutaneous Q4H  . Memantine HCl ER  28 mg Oral Daily  . oxybutynin  5 mg Oral BID  . pantoprazole  40 mg Oral Daily  . sodium chloride  10-40 mL Intracatheter Q12H  . Warfarin - Pharmacist Dosing Inpatient   Does not apply q1800     ASSESSMENT AND PLAN: POD#4 s/p left iliofemoral endarterectomy, left Iliofemoral thromboembolectomy, left above-knee amputation.   1. Atrial fib: Rate controlled on Coreg 3.125  and Digoxin. On Lovenox BID and coumadin bridging. INR 1.83 this am.    [name]  1/27/20157:28 AM

## 2013-07-06 NOTE — Procedures (Signed)
Objective Swallowing Evaluation: Modified Barium Swallowing Study  Patient Details  Name: [name] MRN: [id number] Date of Birth: 1931-07-10  Today's Date: 07/06/2013 Time: 3825-0539 SLP Time Calculation (min): 25 min  Past Medical History:  Past Medical History  Diagnosis Date  . CAD (coronary artery disease)   . Hyperlipidemia   . Diabetes mellitus   . Hypertension   . Suprapubic catheter   . Peripheral vascular disease   . Arthritis   . Myocardial infarction   . Cancer     prostate cancer  . A-fib    Past Surgical History:  Past Surgical History  Procedure Laterality Date  . Prostatectomy      radiation therapy  . Bladder cath    . Laminectomy      lumbar  . Lumbar disc surgery    . 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   Marland Kitchen Umbilical hernia repair  7673'A, 1937    umbilical hernia repair in 9024O, super umbilical hernia repaired in 2006  . Coronary artery bypass graft      2008  . Total hip arthroplasty      RT  . Cataract extraction  2012    Left 03/2011, Right 04/2011  . Amputation  12/03/2011    Procedure: AMPUTATION BELOW KNEE;  Surgeon: Elam Dutch, MD;  Location: [facility] OR;  Service: Vascular;  Laterality: Right;  . Esophagogastroduodenoscopy  02/12/2012    Procedure: ESOPHAGOGASTRODUODENOSCOPY (EGD);  Surgeon: Milus Banister, MD;  Location: [place];  Service: Endoscopy;  Laterality: N/A;  . Peg placement  02/18/2012    Procedure: PERCUTANEOUS ENDOSCOPIC GASTROSTOMY (PEG) PLACEMENT;  Surgeon: Inda Castle, MD;  Location: [place];  Service: Endoscopy;  Laterality: N/A;  . Thrombectomy femoral artery Left 07/01/2013    Procedure: THROMBECTOMY FEMORAL ARTERY Left leg;  Surgeon: Conrad Valle Crucis, MD;  Location: [place];  Service: Vascular;  Laterality: Left;  Thrombectomy of Left femoral artery with left femoral endarterectomy with left above the knee amputation.  . Amputation Left 07/01/2013    Procedure: AMPUTATION  ABOVE KNEE;  Surgeon: Conrad S.N.P.J., MD;  Location: [place];  Service: Vascular;  Laterality: Left;   HPI:  80 y.o. ([birth year]) male known history of PAD requiring prior R BKA, DM, CAD, MI, prostate CA who presents with chief complaint: left leg pain. Pt's memory is somewhat limited so history obtained from patient and family. Onset this AM per patient, but per family definitely ~1 pm, pt's leg was found to be painful and pale. The patient notes mild-moderate pain in left foot, worse with movement of the foot or manipulation with "painful" character.  Underwent Left above-knee amputation 1/23.  Extensive dysphagia history includes MBS 10/23/11 with penetration of thin with barium pill, recommended Dys 3 and thin and suspected primary esophageal dysphagia with recommendation for esophageal f/u.Marland Kitchen  MBS 02/13/12 revealed aspiration prior to swallow with thin recommended Dy3, nectar liquids with water protocol.  EGD 02/12/12 found pooling of secretions in distal esophagus and possible achalasia with consideration of botox injections.  CXR 07/02/13/ revealed lungs mildly hypoexpanded. Mild right-sided airspace opacity may reflect atelectasis or possibly mild pneumonia.      Assessment / Plan / Recommendation Clinical Impression  Dysphagia Diagnosis: Severe pharyngeal phase dysphagia Clinical impression: Pt presents with a moderate to severe pharyngeal dysphagia with sensory motor deficits. Pt with a delay in swallow initiation leading to severe aspiration pefore the swallow with thin liquids and  penetration with nectar or honey. There is also weakness of the hyolaryngeal mechanism leading to moderate residuals in valleculae, pyriform sinuses and into airway with nectar and honey. With consecutive sips of nectar thick liquids penetrate did not increase to significant degree, no aspiration of nectar. Chin tuck worsened aspiration. Cues to clear throat minimally effective. Vocal Jobe Gibbon will continue to sound wet with all  PO due to pooled residuals. There is moderate risk with all PO at this point, but Dys 2/nectar is current recommendation. SLP will f/u for tolerance and exercises to facilitate strength.     Treatment Recommendation  Therapy as outlined in treatment plan below    Diet Recommendation Dysphagia 2 (Fine chop);Nectar-thick liquid   Liquid Administration via: Cup;Spoon;No straw Medication Administration: Whole meds with puree Supervision: Staff to assist with self feeding;Full supervision/cueing for compensatory strategies Compensations: Slow rate;Small sips/bites;Follow solids with liquid Postural Changes and/or Swallow Maneuvers: Seated upright 90 degrees;Upright 30-60 min after meal    Other  Recommendations Oral Care Recommendations: Oral care BID Other Recommendations: Order thickener from pharmacy   Follow Up Recommendations  Skilled Nursing facility    Frequency and Duration min 2x/week  2 weeks   Pertinent Vitals/Pain NA    SLP Swallow Goals     General HPI: 80 y.o. ([birth year]) male known history of PAD requiring prior R BKA, DM, CAD, MI, prostate CA who presents with chief complaint: left leg pain. Pt's memory is somewhat limited so history obtained from patient and family. Onset this AM per patient, but per family definitely ~1 pm, pt's leg was found to be painful and pale. The patient notes mild-moderate pain in left foot, worse with movement of the foot or manipulation with "painful" character.  Underwent Left above-knee amputation 1/23.  Extensive dysphagia history includes MBS 10/23/11 with penetration of thin with barium pill, recommended Dys 3 and thin and suspected primary esophageal dysphagia with recommendation for esophageal f/u.Marland Kitchen  MBS 02/13/12 revealed aspiration prior to swallow with thin recommended Dy3, nectar liquids with water protocol.  EGD 02/12/12 found pooling of secretions in distal esophagus and possible achalasia with consideration of botox injections.  CXR  07/02/13/ revealed lungs mildly hypoexpanded. Mild right-sided airspace opacity may reflect atelectasis or possibly mild pneumonia.  Type of Study: Modified Barium Swallowing Study Reason for Referral: Objectively evaluate swallowing function Diet Prior to this Study: Dysphagia 1 (puree);Honey-thick liquids Temperature Spikes Noted: No Respiratory Status: Nasal cannula History of Recent Intubation: Yes Length of Intubations (days): 1 days Date extubated: 07/02/13 Behavior/Cognition: Alert;Pleasant mood;Cooperative;Confused;Requires cueing Oral Cavity - Dentition: Dentures, top;Dentures, bottom Oral Motor / Sensory Function: Impaired - see Bedside swallow eval Self-Feeding Abilities: Able to feed self Patient Positioning: Upright in chair Baseline Vocal Quality: Clear Volitional Cough: Weak Volitional Swallow: Able to elicit Anatomy: Within functional limits Pharyngeal Secretions: Not observed secondary MBS    Reason for Referral Objectively evaluate swallowing function   Oral Phase Oral Preparation/Oral Phase Oral Phase: WFL   Pharyngeal Phase Pharyngeal Phase Pharyngeal Phase: Impaired Pharyngeal - Honey Pharyngeal - Honey Teaspoon: Reduced pharyngeal peristalsis;Reduced epiglottic inversion;Reduced anterior laryngeal mobility;Reduced laryngeal elevation;Reduced airway/laryngeal closure;Penetration/Aspiration after swallow;Pharyngeal residue - valleculae;Pharyngeal residue - pyriform sinuses;Pharyngeal residue - cp segment;Delayed swallow initiation Penetration/Aspiration details (honey teaspoon): Material enters airway, remains ABOVE vocal cords and not ejected out Pharyngeal - Nectar Pharyngeal - Nectar Teaspoon: Reduced pharyngeal peristalsis;Reduced epiglottic inversion;Reduced anterior laryngeal mobility;Reduced laryngeal elevation;Reduced airway/laryngeal closure;Penetration/Aspiration after swallow;Pharyngeal residue - valleculae;Pharyngeal residue - pyriform sinuses;Pharyngeal  residue - cp segment;Delayed swallow initiation Penetration/Aspiration  details (nectar teaspoon): Material enters airway, remains ABOVE vocal cords and not ejected out Pharyngeal - Nectar Cup: Reduced pharyngeal peristalsis;Reduced epiglottic inversion;Reduced anterior laryngeal mobility;Reduced laryngeal elevation;Reduced airway/laryngeal closure;Penetration/Aspiration after swallow;Pharyngeal residue - valleculae;Pharyngeal residue - pyriform sinuses;Pharyngeal residue - cp segment;Delayed swallow initiation Penetration/Aspiration details (nectar cup): Material enters airway, remains ABOVE vocal cords and not ejected out Pharyngeal - Thin Pharyngeal - Thin Cup: Reduced pharyngeal peristalsis;Reduced epiglottic inversion;Reduced anterior laryngeal mobility;Reduced laryngeal elevation;Reduced airway/laryngeal closure;Pharyngeal residue - valleculae;Pharyngeal residue - pyriform sinuses;Pharyngeal residue - cp segment;Delayed swallow initiation;Penetration/Aspiration before swallow;Penetration/Aspiration after swallow;Significant aspiration (Amount) Penetration/Aspiration details (thin cup): Material enters airway, CONTACTS cords and not ejected out;Material enters airway, passes BELOW cords and not ejected out despite cough attempt by patient;Material enters airway, remains ABOVE vocal cords then ejected out Pharyngeal - Solids Pharyngeal - Puree: Reduced pharyngeal peristalsis;Reduced epiglottic inversion;Reduced anterior laryngeal mobility;Reduced laryngeal elevation;Reduced airway/laryngeal closure;Reduced tongue base retraction;Pharyngeal residue - valleculae;Pharyngeal residue - pyriform sinuses Pharyngeal - Mechanical Soft: Reduced pharyngeal peristalsis;Reduced epiglottic inversion;Reduced anterior laryngeal mobility;Reduced laryngeal elevation;Reduced airway/laryngeal closure;Reduced tongue base retraction;Pharyngeal residue - valleculae;Pharyngeal residue - pyriform sinuses  Cervical Esophageal  Phase    GO             Herbie Baltimore, MA CCC-SLP [phone]  Lynann Beaver 07/06/2013, 3:18 PM

## 2013-07-06 NOTE — Progress Notes (Signed)
Physical Therapy Treatment Patient Details Name: [name] MRN: [id number] DOB: 07/18/1931 Today's Date: 07/06/2013 Time: 1315-1340 PT Time Calculation (min): 25 min  PT Assessment / Plan / Recommendation  History of Present Illness pt admitted with left leg pain and ischemia, s/p L iliofemoral endarterectomy, thromboemboectomy and L AKA.   PT Comments   Pt is unable to assist mobility much at this point.  Mod A with 2 persons for rolling and total +2 for transfers.  Will trial 1 more time if still of acute to see if pt can begin to assist with mobility.  Follow Up Recommendations  SNF;Other (comment) (trial to see if can get back to mobilizing in w/c)     Does the patient have the potential to tolerate intense rehabilitation     Barriers to Discharge        Equipment Recommendations  None recommended by PT    Recommendations for Other Services    Frequency Min 2X/week   Progress towards PT Goals Progress towards PT goals: Not progressing toward goals - comment (pt unable to assist with mobility at this point)  Plan Current plan remains appropriate    Precautions / Restrictions Precautions Precautions: Fall Restrictions Other Position/Activity Restrictions: B LE amputations   Pertinent Vitals/Pain     Mobility  Bed Mobility Overal bed mobility: Needs Assistance;+2 for physical assistance Bed Mobility: Rolling;Supine to Sit;Sit to Supine Rolling: Mod assist;+2 for physical assistance (with rail) Supine to sit: Total assist;+2 for physical assistance (to about 80* hip flexion, the rest at his spine) Sit to supine: Total assist;+2 for physical assistance General bed mobility comments: physical and vc's to get pt to initiate roll; significant assist to get to sitting due to tightness in pt trunk and hams. Transfers Overall transfer level: Needs assistance Equipment used: None Transfers: Anterior-Posterior Transfer;Squat Pivot Transfers (modified squat-pivot--quad  lift) Squat pivot transfers: Total assist;+2 physical assistance (with BKA support between therapists legs) Anterior-Posterior transfers: Total assist;+2 physical assistance General transfer comment: significant assist need to hold pt into sitting against truncal tightness and little to no abdominal assist.  Pt unable to get his UE's into a position to support in sitting or help to scoot.    Exercises Other Exercises Other Exercises: Hamstring stretch bil and general hip/knee aa/PROM bil   PT Diagnosis:    PT Problem List:   PT Treatment Interventions:     PT Goals (current goals can now be found in the care plan section) Acute Rehab PT Goals Time For Goal Achievement: 07/16/13 Potential to Achieve Goals: Fair  Visit Information  Last PT Received On: 07/06/13 Assistance Needed: +2 History of Present Illness: pt admitted with left leg pain and ischemia, s/p L iliofemoral endarterectomy, thromboemboectomy and L AKA.    Subjective Data  Subjective: We'll try...   Cognition  Cognition Arousal/Alertness: Awake/alert Behavior During Therapy: Flat affect Overall Cognitive Status: History of cognitive impairments - at baseline Memory: Decreased short-term memory    Balance  Balance Overall balance assessment: Needs assistance Sitting-balance support: No upper extremity supported (pt unable to get UE's into a supporting position) Sitting balance-Leahy Scale: Zero General Comments General comments (skin integrity, edema, etc.): Immediately upon getting patient into the recliner onto a lift pad, radiology came for a scheduled flouroscopy, so we immediately had to transfer pt back to bed.  End of Session PT - End of Session Activity Tolerance: Patient tolerated treatment well;Other (comment) (But pt not able to assist ) Patient left: in bed;with call bell/phone  within reach Nurse Communication: Mobility status;Need for lift equipment   GP     Brodie Correll, Tessie Fass 07/06/2013, 4:06  PM 07/06/2013  Donnella Sham, PT [phone]  (pager)

## 2013-07-06 NOTE — Care Management Note (Unsigned)
    Page 1 of 1   07/06/2013     4:21:46 PM   CARE MANAGEMENT NOTE 07/06/2013  Patient:  [name], [name]   Account Number:  [id number]  Date Initiated:  07/02/2013  Documentation initiated by:  [facility]  Subjective/Objective Assessment:   Admitted cold ischemic leg - post op  left iliofemoral endarterectomy  Left Iliofemoral thromboembolectomy  Left above-knee amputation     Action/Plan:   Anticipated DC Date:  2013/07/13   Anticipated DC Plan:  [place]  CM consult      Choice offered to / List presented to:             Status of service:  In process, will continue to follow Medicare Important Message given?   (If response is "NO", the following Medicare IM given date fields will be blank) Date Medicare IM given:   Date Additional Medicare IM given:    Discharge Disposition:    Per UR Regulation:  Reviewed for med. necessity/level of care/duration of stay  If discussed at [place] of Stay Meetings, dates discussed:    Comments:  ContactJaleal, [name]     [phone]                [facility] Daughter [phone]                [facility] Daughter [phone]  07/06/13 Sean Vessels,RN,BSN [phone] PT ADM ON 1/22 FROM [facility]; CSW FOLLOWING TO FACILITATE RETURN TO SNF WHEN MEDICALLY STABLE.

## 2013-07-07 LAB — CBC
HCT: 34 % — ABNORMAL LOW (ref 39.0–52.0)
Hemoglobin: 11.4 g/dL — ABNORMAL LOW (ref 13.0–17.0)
MCH: 30.2 pg (ref 26.0–34.0)
MCHC: 33.5 g/dL (ref 30.0–36.0)
MCV: 90.2 fL (ref 78.0–100.0)
PLATELETS: 181 10*3/uL (ref 150–400)
RBC: 3.77 MIL/uL — AB (ref 4.22–5.81)
RDW: 16.7 % — AB (ref 11.5–15.5)
WBC: 8.5 10*3/uL (ref 4.0–10.5)

## 2013-07-07 LAB — GLUCOSE, CAPILLARY
GLUCOSE-CAPILLARY: 85 mg/dL (ref 70–99)
GLUCOSE-CAPILLARY: 70 mg/dL (ref 70–99)
GLUCOSE-CAPILLARY: 81 mg/dL (ref 70–99)
GLUCOSE-CAPILLARY: 105 mg/dL — AB (ref 70–99)
Glucose-Capillary: 80 mg/dL (ref 70–99)

## 2013-07-07 LAB — PROTIME-INR
INR: 2.36 — ABNORMAL HIGH (ref 0.00–1.49)
PROTHROMBIN TIME: 25 s — AB (ref 11.6–15.2)

## 2013-07-07 MED ORDER — TRAMADOL HCL 50 MG PO TABS: 50.0000 mg | ORAL_TABLET | ORAL | Status: AC | PRN

## 2013-07-07 MED ORDER — RESOURCE THICKENUP CLEAR PO POWD
ORAL | Status: DC | PRN
  Filled 2013-07-07: qty 125

## 2013-07-07 MED ORDER — MUPIROCIN 2 % EX OINT
1.0000 "application " | TOPICAL_OINTMENT | Freq: Two times a day (BID) | CUTANEOUS | Status: DC
  Administered 2013-07-07: 1 via NASAL
  Filled 2013-07-07: qty 22

## 2013-07-07 MED ORDER — CHLORHEXIDINE GLUCONATE CLOTH 2 % EX PADS: 6.0000 | MEDICATED_PAD | Freq: Every day | CUTANEOUS | Status: DC

## 2013-07-07 MED ORDER — STARCH (THICKENING) PO POWD: ORAL | Status: DC | PRN

## 2013-07-07 MED ORDER — WARFARIN SODIUM 2.5 MG PO TABS
2.5000 mg | ORAL_TABLET | Freq: Every day | ORAL | Status: DC
  Administered 2013-07-07: 2.5 mg via ORAL
  Filled 2013-07-07: qty 1

## 2013-07-07 MED ORDER — ENSURE PUDDING PO PUDG: 1.0000 | Freq: Three times a day (TID) | ORAL | Status: AC

## 2013-07-07 MED ORDER — WARFARIN SODIUM 2.5 MG PO TABS: 2.5000 mg | ORAL_TABLET | Freq: Every day | ORAL | Status: AC

## 2013-07-11 NOTE — Progress Notes (Addendum)
[facility]  Subjective  - no complaints.     Objective 129/72 87 98.1 F (36.7 C) (Oral) 18 98%  Intake/Output Summary (Last 24 hours) at 2013/07/22 0743 Last data filed at 07/22/2013 0441  Gross per 24 hour  Intake    120 ml  Output    900 ml  Net   -780 ml    Left groin incision healing well, stump healing well. Bi[employer] sock in place  Assessment/Planning: POD #5 PROCEDURE:  1. left iliofemoral endarterectomy  2. Left Iliofemoral thromboembolectomy 3. Left above-knee amputation  Discharge to SNF today F/U in 4 weeks for staple removal   Laurence Slate [facility] 07/22/2013 7:43 AM --  Laboratory Lab Results:  Recent Labs  07/06/13 0430 Jul 22, 2013 0509  WBC 7.1 8.5  HGB 9.9* 11.4*  HCT 28.9* 34.0*  PLT 150 181   BMET  Recent Labs  07/04/13 1723 07/06/13 0430  NA 141 144  K 4.4 3.7  CL 115* 119*  CO2 18* 18*  GLUCOSE 100* 93  BUN 19 17  CREATININE 1.01 1.04  CALCIUM 6.8* 6.8*    COAG Lab Results  Component Value Date   INR 2.36* 07/22/2013   INR 1.83* 07/06/2013   INR 1.40 07/02/2013   No results found for this basename: PTT     Addendum  I have independently interviewed and examined the patient, and I agree with the physician assistant's findings.  Coumadin therapeutic.  Swallow results on chart.  Cont Dysphagia 2 diet with instructions on chart.  Ok to return to SNF when bed available.  Left groin staples out in one week.  Adele Barthel, MD [facility] [place] Office: [phone] Pager: [phone]  2013-07-22, 8:09 AM

## 2013-07-11 NOTE — Discharge Summary (Addendum)
[facility] Discharge Summary   Patient ID:  [name] MRN: [id number] DOB/AGE: [birth year] 80 y.o.  Admit date: 07/01/2013 Discharge date: Aug 06, 2013 Date of Surgery: 07/01/2013 - 07/02/2013 Surgeon: Juliann Mule): Conrad Tobias, MD  Admission Diagnosis: Ischemic foot [459.9] Peripheral vascular disease of lower extremity [443.9]  Discharge Diagnoses:  Ischemic foot [459.9] Peripheral vascular disease of lower extremity [443.9]  Secondary Diagnoses: Past Medical History  Diagnosis Date  . CAD (coronary artery disease)   . Hyperlipidemia   . Diabetes mellitus   . Hypertension   . Suprapubic catheter   . Peripheral vascular disease   . Arthritis   . Myocardial infarction   . Cancer     prostate cancer  . A-fib    Consultants:  [facility] (follow 2-3 weeks with Dr. Percival Spanish)  PROCEDURE: (07/02/13) 1. left iliofemoral endarterectomy  2. Left Iliofemoral thromboembolectomy 3. Left above-knee amputation   Discharged Condition: good  HPI/Hospital Course: [name] is a 80 y.o. (07-Sep-1931) male known history of PAD requiring prior R BKA who presents with chief complaint: left leg pain. He was diagnosed with likely L femoral thromboembolism and dead left lower leg in the setting of recent discontinuation of anticoagulation due to anemia of unknown origin.  On 07/02/13, he under went left iliofemoral endarterectomy, Left Iliofemoral thromboembolectomy, and Left above-knee amputation.   He was left intubated on the day of surgery due to large volume blood transfusion for the procedure and resuscitation.  He was extubated POD #1.  The was kept in the ICU for a few days due to recurrent hypotension and altered mental status.  His sensorium cleared and he returned to his baseline of cognitive impaired.  His blood pressure stabilized.  He was reloaded on Coumadin for his atrial fibrillation.   [facility] was consulted POD #1 to assist with management  of his atrial fibrillation.  Speech therapy was also consulted and they recommended a: Dysphagia 2 (Fine chop);Nectar-thick liquid diet. At this point, the patient is therapeutic on Coumadin, so Lovenox can be discontinued.  He will need follow up:  1. Dr. Bridgett Larsson at [facility] in 4 weeks 2. Coumadin management per [facility] or PCP 3. Dr. Percival Spanish in 2-3 weeks [facility])  Wound care:  D/C left groin staples in one week  Left AKA stump staples d/c in 4 weeks  Consults:  Treatment Team:  Darlin Coco, MD SLP  Significant Diagnostic Studies: CBC Lab Results  Component Value Date   WBC 8.5 06-Aug-2013   HGB 11.4* 2013/08/06   HCT 34.0* 08-06-13   MCV 90.2 August 06, 2013   PLT 181 08/06/13    BMET    Component Value Date/Time   NA 144 07/06/2013 0430   K 3.7 07/06/2013 0430   CL 119* 07/06/2013 0430   CO2 18* 07/06/2013 0430   GLUCOSE 93 07/06/2013 0430   BUN 17 07/06/2013 0430   CREATININE 1.04 07/06/2013 0430   CREATININE 0.96 04/03/2011 0857   CALCIUM 6.8* 07/06/2013 0430   GFRNONAA 66* 07/06/2013 0430   GFRAA 77* 07/06/2013 0430   COAG Lab Results  Component Value Date   INR 2.36* August 06, 2013   INR 1.83* 07/06/2013   INR 1.40 07/02/2013     Disposition:  Discharge to :Skilled nursing facility Discharge Orders   Future Orders Complete By Expires   Activity as tolerated - No restrictions  As directed    Call MD for:  redness, tenderness, or signs of infection (pain, swelling, bleeding, redness, odor  or green/yellow discharge around incision site)  As directed    Call MD for:  severe or increased pain, loss or decreased feeling  in affected limb(s)  As directed    Call MD for:  temperature >100.5  As directed    Discharge instructions  As directed    Comments:     Keep the left groin incision clean and dry place 4x4 dry guaze in groin fold-no tape to protect skin.   May shower   As directed    Resume previous diet  As directed        Medication List    STOP  taking these medications       enoxaparin 60 MG/0.6ML injection  Commonly known as:  LOVENOX      TAKE these medications       acetaminophen 650 MG CR tablet  Commonly known as:  TYLENOL  Take 650 mg by mouth every 4 (four) hours as needed for pain.     atorvastatin 10 MG tablet  Commonly known as:  LIPITOR  Take 10 mg by mouth at bedtime.     carvedilol 3.125 MG tablet  Commonly known as:  COREG  Take 3.125 mg by mouth daily.     cholestyramine light 4 GM/DOSE powder  Commonly known as:  PREVALITE  Take 4 g by mouth daily as needed (for loose stool).     docusate sodium 50 MG capsule  Commonly known as:  COLACE  Take 50 mg by mouth daily as needed for mild constipation.     feeding supplement (ENSURE) Pudg  Take 1 Container by mouth 3 (three) times daily between meals.     feeding supplement (PRO-STAT SUGAR FREE 64) Liqd  Take 30 mLs by mouth 2 (two) times daily.     Iron 142 (45 FE) MG Tbcr  Take 142 mg by mouth daily.     magnesium oxide 400 MG tablet  Commonly known as:  MAG-OX  Take 400 mg by mouth daily.     metFORMIN 500 MG 24 hr tablet  Commonly known as:  GLUCOPHAGE-XR  Take 500 mg by mouth 2 (two) times daily.     MULTIVITAMIN PO  Take 1 tablet by mouth daily.     NAMENDA XR 28 MG Cp24  Generic drug:  Memantine HCl ER  Take 28 mg by mouth daily.     ondansetron 4 MG tablet  Commonly known as:  ZOFRAN  Take 4 mg by mouth every 4 (four) hours as needed for nausea or vomiting.     oxybutynin 5 MG tablet  Commonly known as:  DITROPAN  Take 5 mg by mouth 2 (two) times daily.     pantoprazole 40 MG tablet  Commonly known as:  PROTONIX  Take 40 mg by mouth 2 (two) times daily.     polyethylene glycol packet  Commonly known as:  MIRALAX / GLYCOLAX  Take 17 g by mouth daily as needed for mild constipation.     traMADol 50 MG tablet  Commonly known as:  ULTRAM  Take 1 tablet (50 mg total) by mouth every 4 (four) hours as needed for moderate pain.      Vitamin D (Ergocalciferol) 50000 UNITS Caps capsule  Commonly known as:  DRISDOL  Take 50,000 Units by mouth every 30 (thirty) days. *takes on the 15th of every month*       Verbal and written Discharge instructions given to the patient. Wound care per Discharge AVS F/U with  Dr. Bridgett Larsson in 4 weeks from date of surgery 07/02/2013 Need cardiology follow up and INR checks for coumadin dosing.  SignedRoxy Horseman 2013/07/09, 7:55 AM  Addendum  I have independently interviewed and examined the patient, and I agree with the physician assistant's discharge summary which I have editted.  Adele Barthel, MD [facility] [place] Office: [phone] Pager: [phone]  07/09/13, 9:59 AM

## 2013-07-11 NOTE — Progress Notes (Signed)
     SUBJECTIVE: No complaints.   BP 129/72  Pulse 87  Temp(Src) 98.1 F (36.7 C) (Oral)  Resp 18  Ht 5\' 6"  (1.676 m)  Wt 126 lb 1.7 oz (57.2 kg)  BMI 20.36 kg/m2  SpO2 98%  Intake/Output Summary (Last 24 hours) at Jul 19, 2013 1660 Last data filed at 07-19-13 0441  Gross per 24 hour  Intake    120 ml  Output    900 ml  Net   -780 ml    PHYSICAL EXAM General: Well developed, well nourished, in no acute distress. Alert and oriented x 3.  Psych: Flat affect, responds appropriately  Neck: No JVD. No masses noted.  Lungs: Clear bilaterally with no wheezes or rhonci noted.  Heart: Irregular irregular with no murmurs noted.  Abdomen: Bowel sounds are present. Soft, non-tender.  Extremities: bilateral LE amputations.   LABS: Basic Metabolic Panel:  Recent Labs  07/04/13 1723 07/06/13 0430  NA 141 144  K 4.4 3.7  CL 115* 119*  CO2 18* 18*  GLUCOSE 100* 93  BUN 19 17  CREATININE 1.01 1.04  CALCIUM 6.8* 6.8*   CBC:  Recent Labs  07/06/13 0430 07-19-2013 0509  WBC 7.1 8.5  HGB 9.9* 11.4*  HCT 28.9* 34.0*  MCV 90.6 90.2  PLT 150 181    Current Meds: . atorvastatin  10 mg Oral QHS  . carvedilol  3.125 mg Oral Q breakfast  . Chlorhexidine Gluconate Cloth  6 each Topical Q0600  . collagenase   Topical Daily  . digoxin  0.125 mg Oral Daily  . enoxaparin (LOVENOX) injection  55 mg Subcutaneous Q12H  . feeding supplement (ENSURE)  1 Container Oral TID BM  . insulin aspart  0-15 Units Subcutaneous Q4H  . Memantine HCl ER  28 mg Oral Daily  . oxybutynin  5 mg Oral BID  . pantoprazole  40 mg Oral Daily  . sodium chloride  10-40 mL Intracatheter Q12H  . warfarin  2.5 mg Oral Daily  . Warfarin - Pharmacist Dosing Inpatient   Does not apply q1800    ASSESSMENT AND PLAN: POD#4 s/p left iliofemoral endarterectomy, left Iliofemoral thromboembolectomy, left above-knee amputation.   1. Atrial fib: Rate controlled on Coreg 3.125 and Digoxin. On Lovenox BID and coumadin  bridging. INR 2.36 this am. OK to d/c Lovenox. He will need to f/u with Dr. Percival Spanish 2-3 weeks after discharge in our [facility] office.    Will sign off. Please call with questions.   MCALHANY,CHRISTOPHER  2015/02/098:52 AM

## 2013-07-11 NOTE — Progress Notes (Addendum)
Clinical Social Worker facilitated patient discharge by contacting the family, patient's daughter and [facility]. Patient's daughter is agreeable to this plan and arranging transport via EMS for 1pm . CSW will sign off, as social work intervention is no longer needed.  Jeanette Caprice, MSW, [place]

## 2013-07-11 NOTE — Progress Notes (Signed)
Speech Language Pathology Treatment: Dysphagia  Patient Details Name: [name] MRN: [id number] DOB: [birth year] Today's Date: 2013-07-29 Time: 4944-9675 SLP Time Calculation (min): 11 min  Assessment / Plan / Recommendation Clinical Impression  Pt. Seen for dysphagia treatment following MBS yestreday.  S/S aspiration included wet vocal quality, delayed cough and throat clear following cup sips nectar juice and magic cup ice cream with moderate visual and verbal cues for small sips.  Exhibited intermittent ability to execute second swallow.  Volitional cough weak and unable to bring pharyngeal residue up to oral cavity.  SLP able to suction mild amount of pharyngeal residue with [place].  Aspiration risk appears high.  Recommend continue Dys 2 diet with nectar liquids, full supervision and assist at SNF.   HPI HPI: 80 y.o. (Aug 25, 1931) male known history of PAD requiring prior R BKA, DM, CAD, MI, prostate CA who presents with chief complaint: left leg pain. Pt's memory is somewhat limited so history obtained from patient and family. Onset this AM per patient, but per family definitely ~1 pm, pt's leg was found to be painful and pale. The patient notes mild-moderate pain in left foot, worse with movement of the foot or manipulation with "painful" character.  Underwent Left above-knee amputation 1/23.  Extensive dysphagia history includes MBS 10/23/11 with penetration of thin with barium pill, recommended Dys 3 and thin and suspected primary esophageal dysphagia with recommendation for esophageal f/u.Marland Kitchen  MBS 02/13/12 revealed aspiration prior to swallow with thin recommended Dy3, nectar liquids with water protocol.  EGD 02/12/12 found pooling of secretions in distal esophagus and possible achalasia with consideration of botox injections.  CXR 07/02/13/ revealed lungs mildly hypoexpanded. Mild right-sided airspace opacity may reflect atelectasis or possibly mild pneumonia.    Pertinent Vitals WDL  SLP Plan  Continue with current plan of care    Recommendations Diet recommendations: Dysphagia 2 (fine chop);Nectar-thick liquid Liquids provided via: Cup;No straw Medication Administration: Whole meds with puree Supervision: Staff to assist with self feeding;Full supervision/cueing for compensatory strategies Compensations: Clear throat intermittently Postural Changes and/or Swallow Maneuvers: Seated upright 90 degrees;Upright 30-60 min after meal              Oral Care Recommendations: Oral care BID Follow up Recommendations: Skilled Nursing facility Plan: Continue with current plan of care    GO     Houston Siren M[employer]Ed [employer] [phone]  07/29/13

## 2013-07-11 DEATH — deceased

## 2013-07-20 ENCOUNTER — Telehealth: Payer: Self-pay

## 2013-07-20 NOTE — Telephone Encounter (Signed)
Patient past away per Obituary in [place] [employer] °

## 2013-07-29 NOTE — Clinical Social Work Psychosocial (Signed)
Assessment note reviewed and approved as written.  Loyde Orth, MSW, LCSW [phone] 

## 2013-12-28 IMAGING — CT CT HIP*R* W/O CM
1 of 3 series · 13 of 32 positions shown, 19 images · non-contrast
Comparison: Radiographs dated 10/24/2011

CLINICAL DATA: Right hip pain.  Obturator ring fracture.

CT OF THE RIGHT HIP WITHOUT CONTRAST
TECHNIQUE: Multidetector CT imaging was performed according to the
standard protocol. Multiplanar CT image reconstructions were also
generated.

[Series 2: hip · axial · 0.43mm/px · z∈[-318,-98]mm · 13 of 104 slices shown, 19 images]
[im 8/104  soft-tissue]
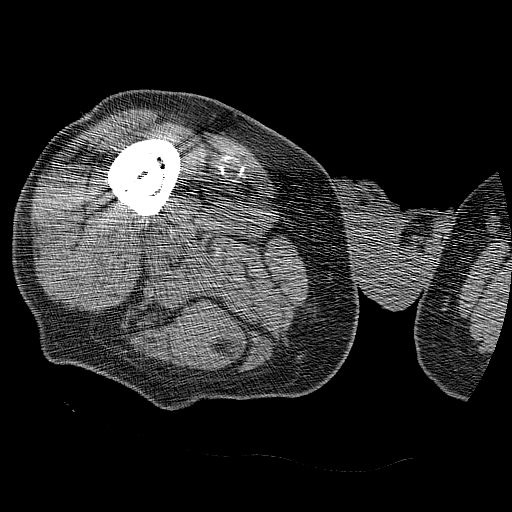
[im 8/104  bone]
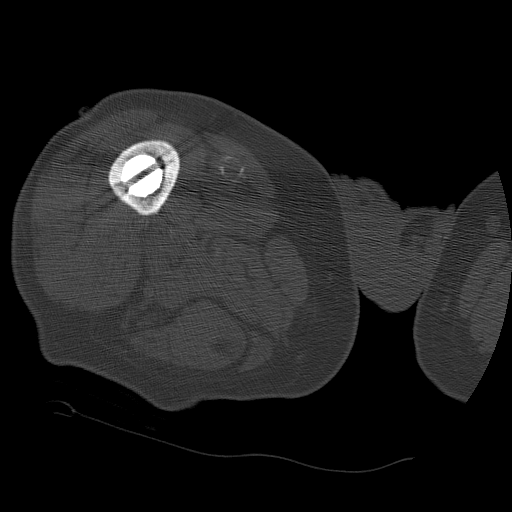
[im 15/104  soft-tissue]
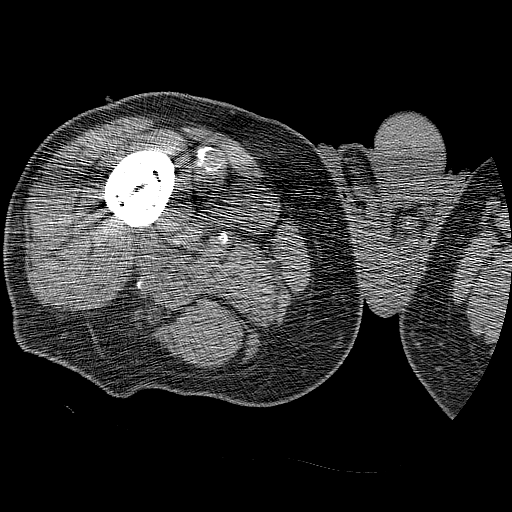
[im 23/104  soft-tissue]
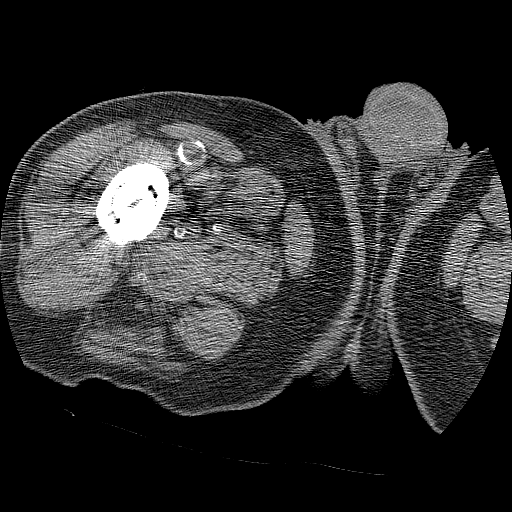
[im 30/104  soft-tissue]
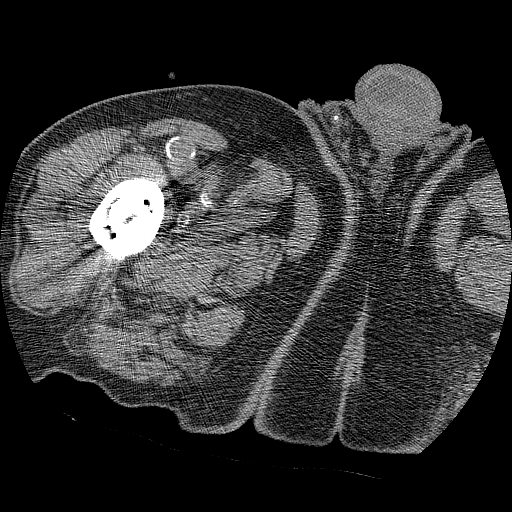
[im 37/104  soft-tissue]
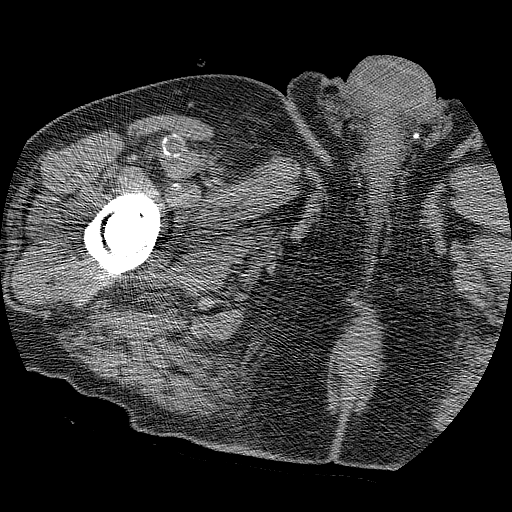
[im 45/104  soft-tissue]
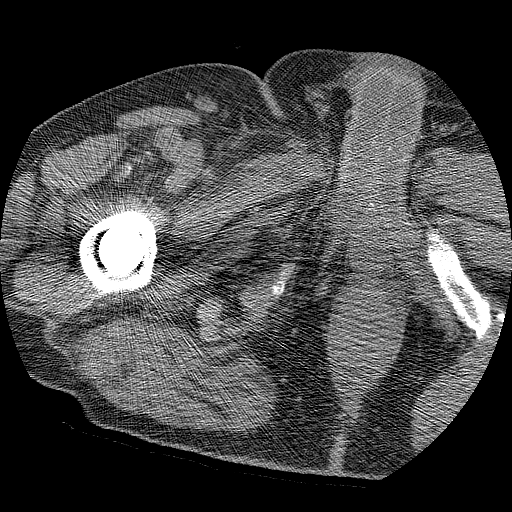
[im 52/104  soft-tissue]
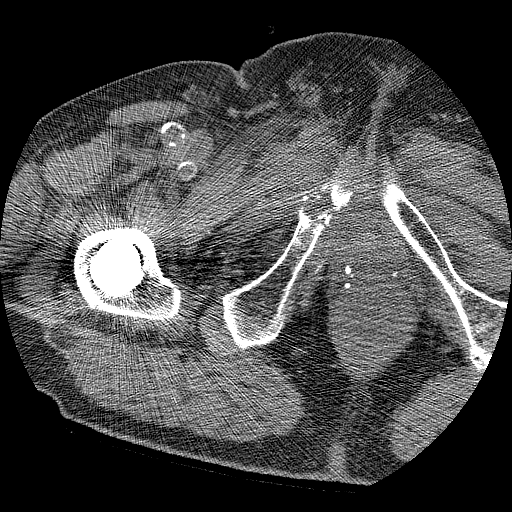
[im 59/104  soft-tissue]
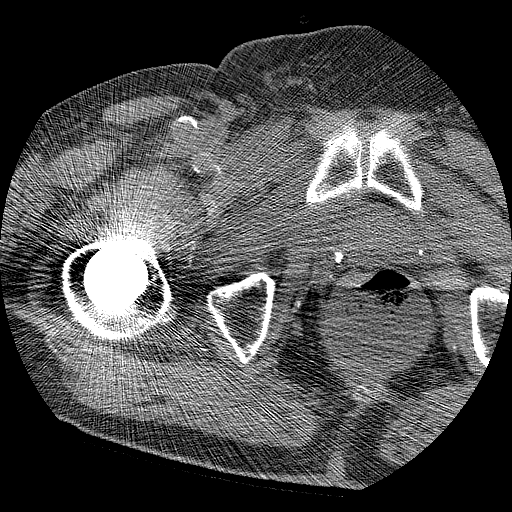
[im 67/104  soft-tissue]
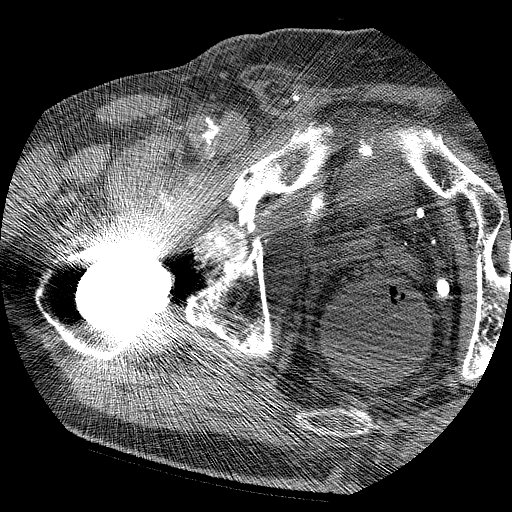
[im 67/104  bone]
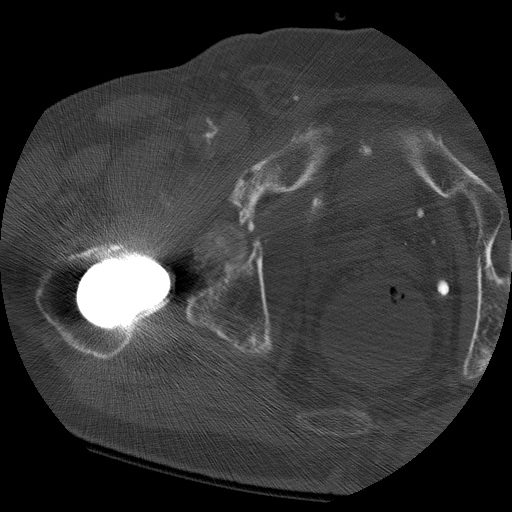
[im 74/104  soft-tissue]
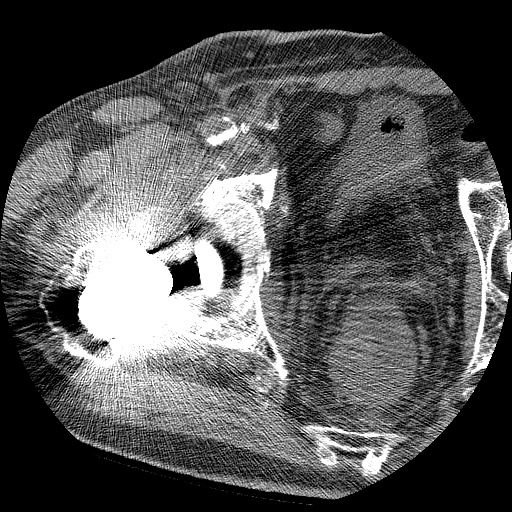
[im 74/104  lung]
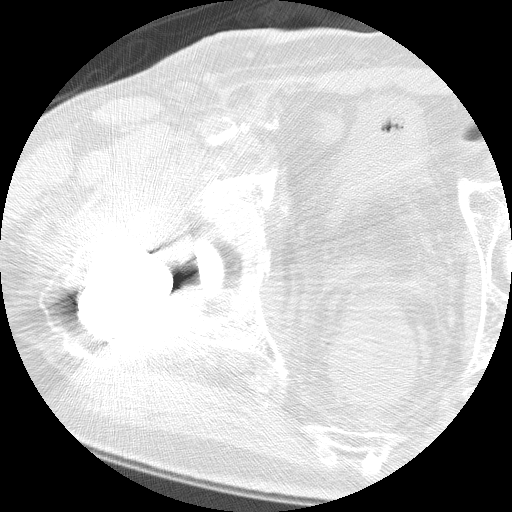
[im 81/104  soft-tissue]
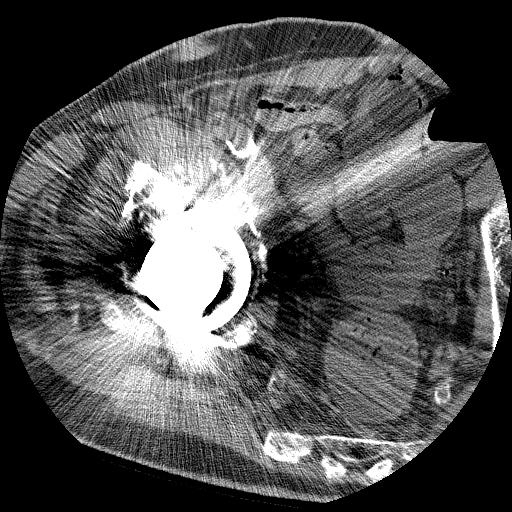
[im 81/104  lung]
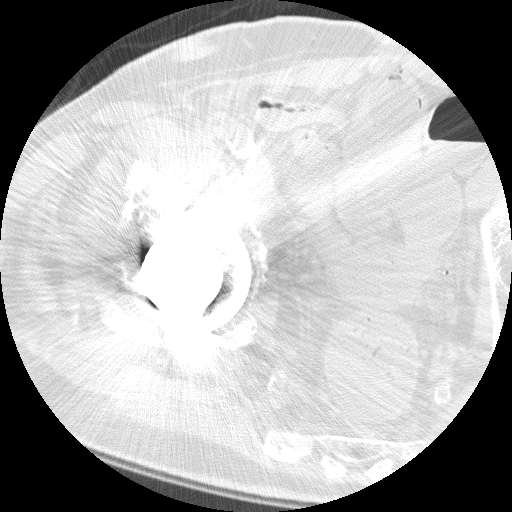
[im 89/104  soft-tissue]
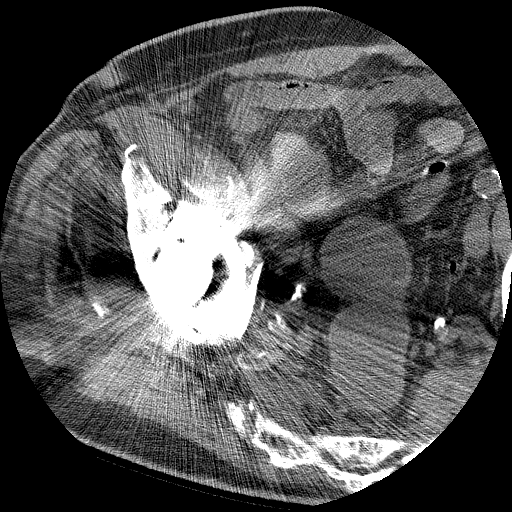
[im 89/104  lung]
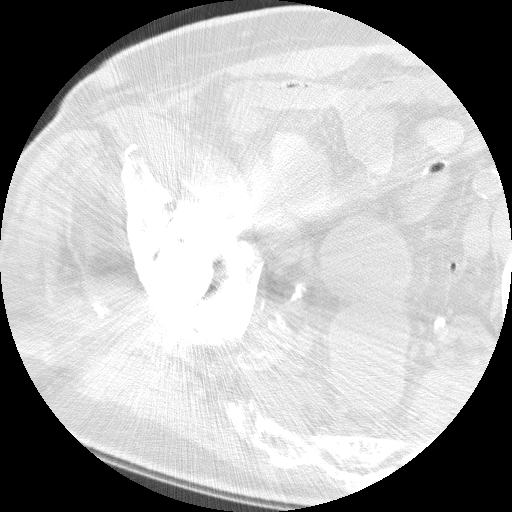
[im 96/104  soft-tissue]
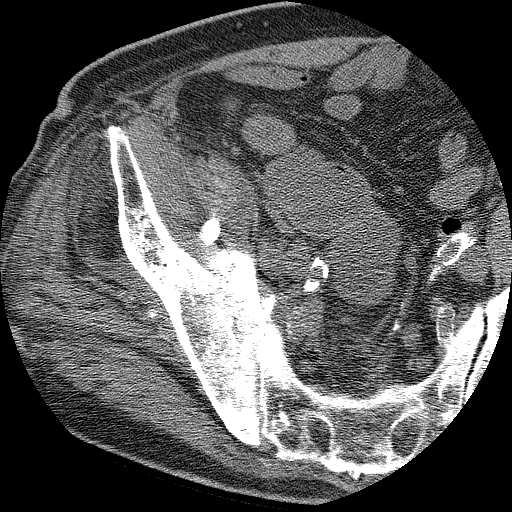
[im 96/104  lung]
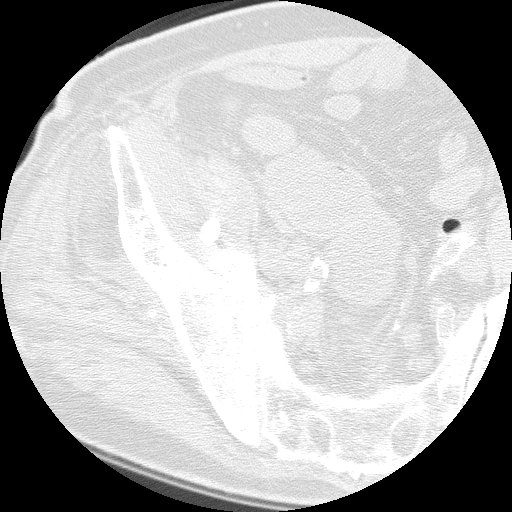

[13 of 32 positions shown; findings below may reference images not displayed]

FINDINGS: There is a comminuted displaced fracture of the right
acetabulum.  There is also a fragmented fracture of the medial
aspect of the right inferior pubic ramus and a nondisplaced
fracture of the superior pubic ramus adjacent to the acetabulum.

The acetabular component of the total hip prosthesis has migrated
superiorly within the ilium.  The screws extend into the iliacus
muscle.  There is what appears to be an old fracture of the right
ilium at the posterior inferior aspect of the sacroiliac joint.
There is high density material within the inferior aspect of the
acetabulum which may either represent cement or old hemorrhage.
There is enlargement of the right obturator internus muscle
probably due to hemorrhage secondary to the pubic rami fractures.

There is also some dystrophic calcification in the soft tissues
posterior to the right superior pubic ramus which may represent
dystrophic calcification in old hemorrhage.

The femoral component of the right total hip prosthesis appears in
good position with no evidence of loosening.  Proximal femur is
intact.

Suprapubic catheter is in place.
IMPRESSION: Comminuted fractures of the right acetabulum with a large defect in
the medial wall.

Fractures of the right inferior and superior pubic rami.

Incompletely healed fracture of the posterior aspect of the right
ilium adjacent to the inferior aspect of the right SI joint.

## 2014-01-01 IMAGING — CR DG CHEST 1V PORT
1 series · 1 of 1 positions shown · non-contrast
Comparison: Portable chest x-ray earlier same date 4806 hours.

CLINICAL DATA: Bedside PICC placement.

PORTABLE CHEST - 1 VIEW [DATE]/1195 1511 hours:

[view not recorded]
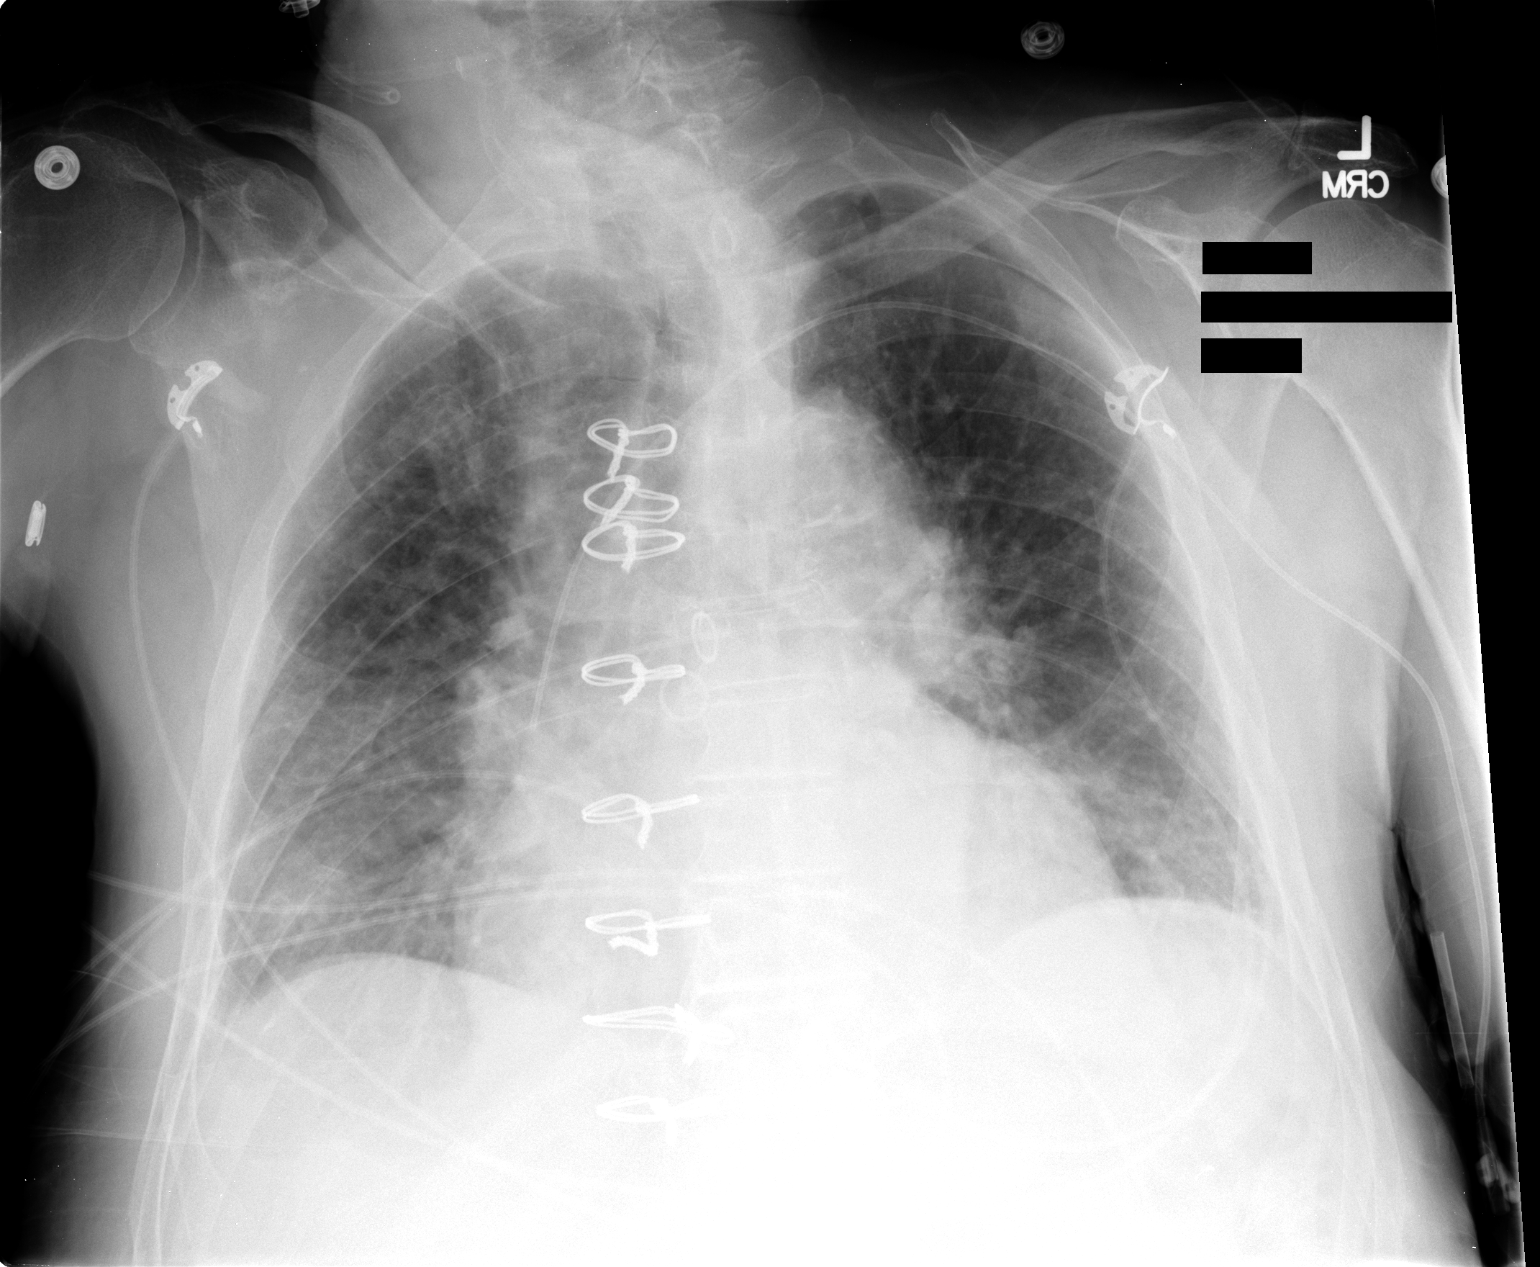

[1 of 1 positions shown; findings below may reference images not displayed]

FINDINGS: Left arm PICC tip in the SVC.  Prior sternotomy for CABG.
Cardiac silhouette enlarged but stable.  Moderate diffuse
interstitial pulmonary edema, slightly increased since the earlier
examination.  Possible small pleural effusions.
IMPRESSION: 1.  Left arm PICC tip in the SVC.
2.  Progressive interstitial pulmonary edema since the examination
earlier tonight.

## 2014-01-03 IMAGING — CR DG CHEST 1V PORT
1 series · 1 of 1 positions shown · non-contrast
Comparison: 10/28/2011

CLINICAL DATA: Shortness of breath

PORTABLE CHEST - 1 VIEW

[AP]
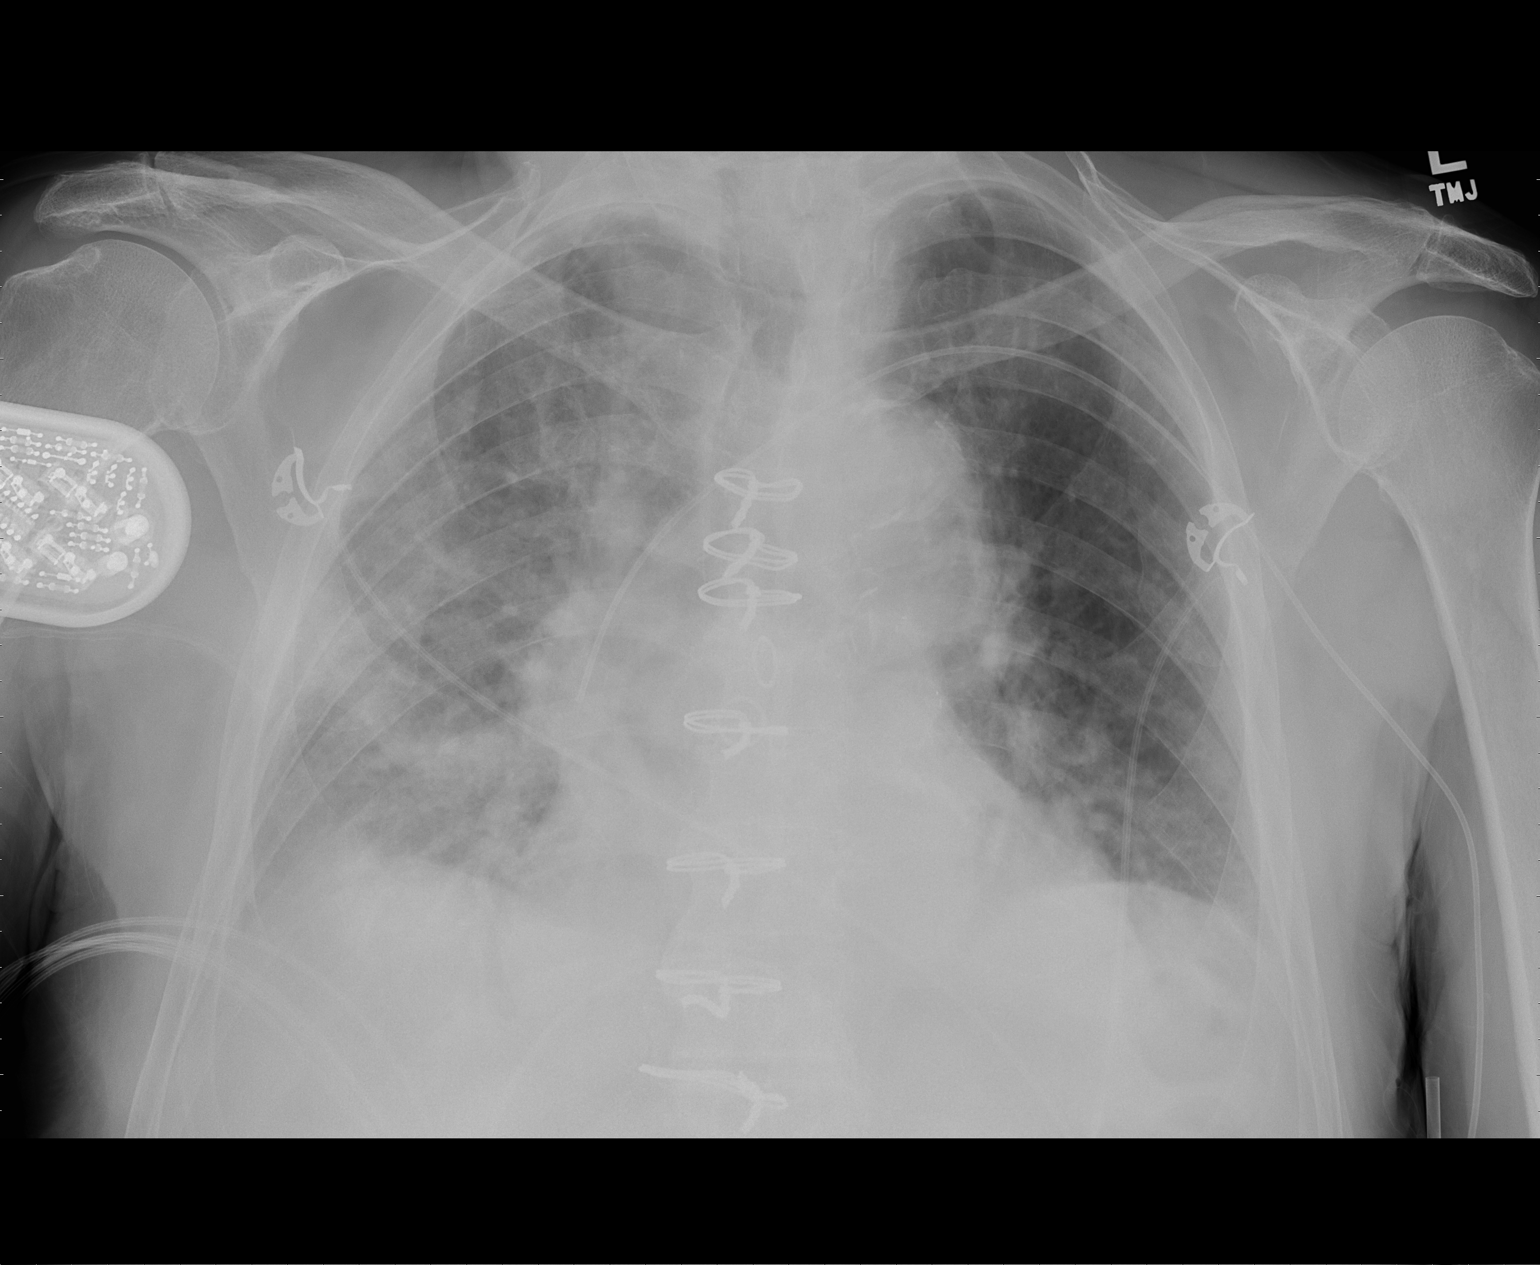

[1 of 1 positions shown; findings below may reference images not displayed]

FINDINGS: Cardiomediastinal silhouette is stable.  Status post CABG
again noted.  Stable left arm PICC line position.

Persistent mild pulmonary edema right greater than left.  Question
small right pleural effusion with right basilar atelectasis or
infiltrate.
IMPRESSION: Persistent mild pulmonary edema right greater than left.  Question
small right pleural effusion with right basilar atelectasis or
infiltrate.]

## 2014-04-14 IMAGING — CR DG CHEST 2V
2 series · 2 of 2 positions shown · non-contrast
Comparison: 12/02/2011.

CLINICAL DATA: Generalized fatigue.  No fever.  No cough for
congestion.

CHEST - 2 VIEW

[w chest lat]
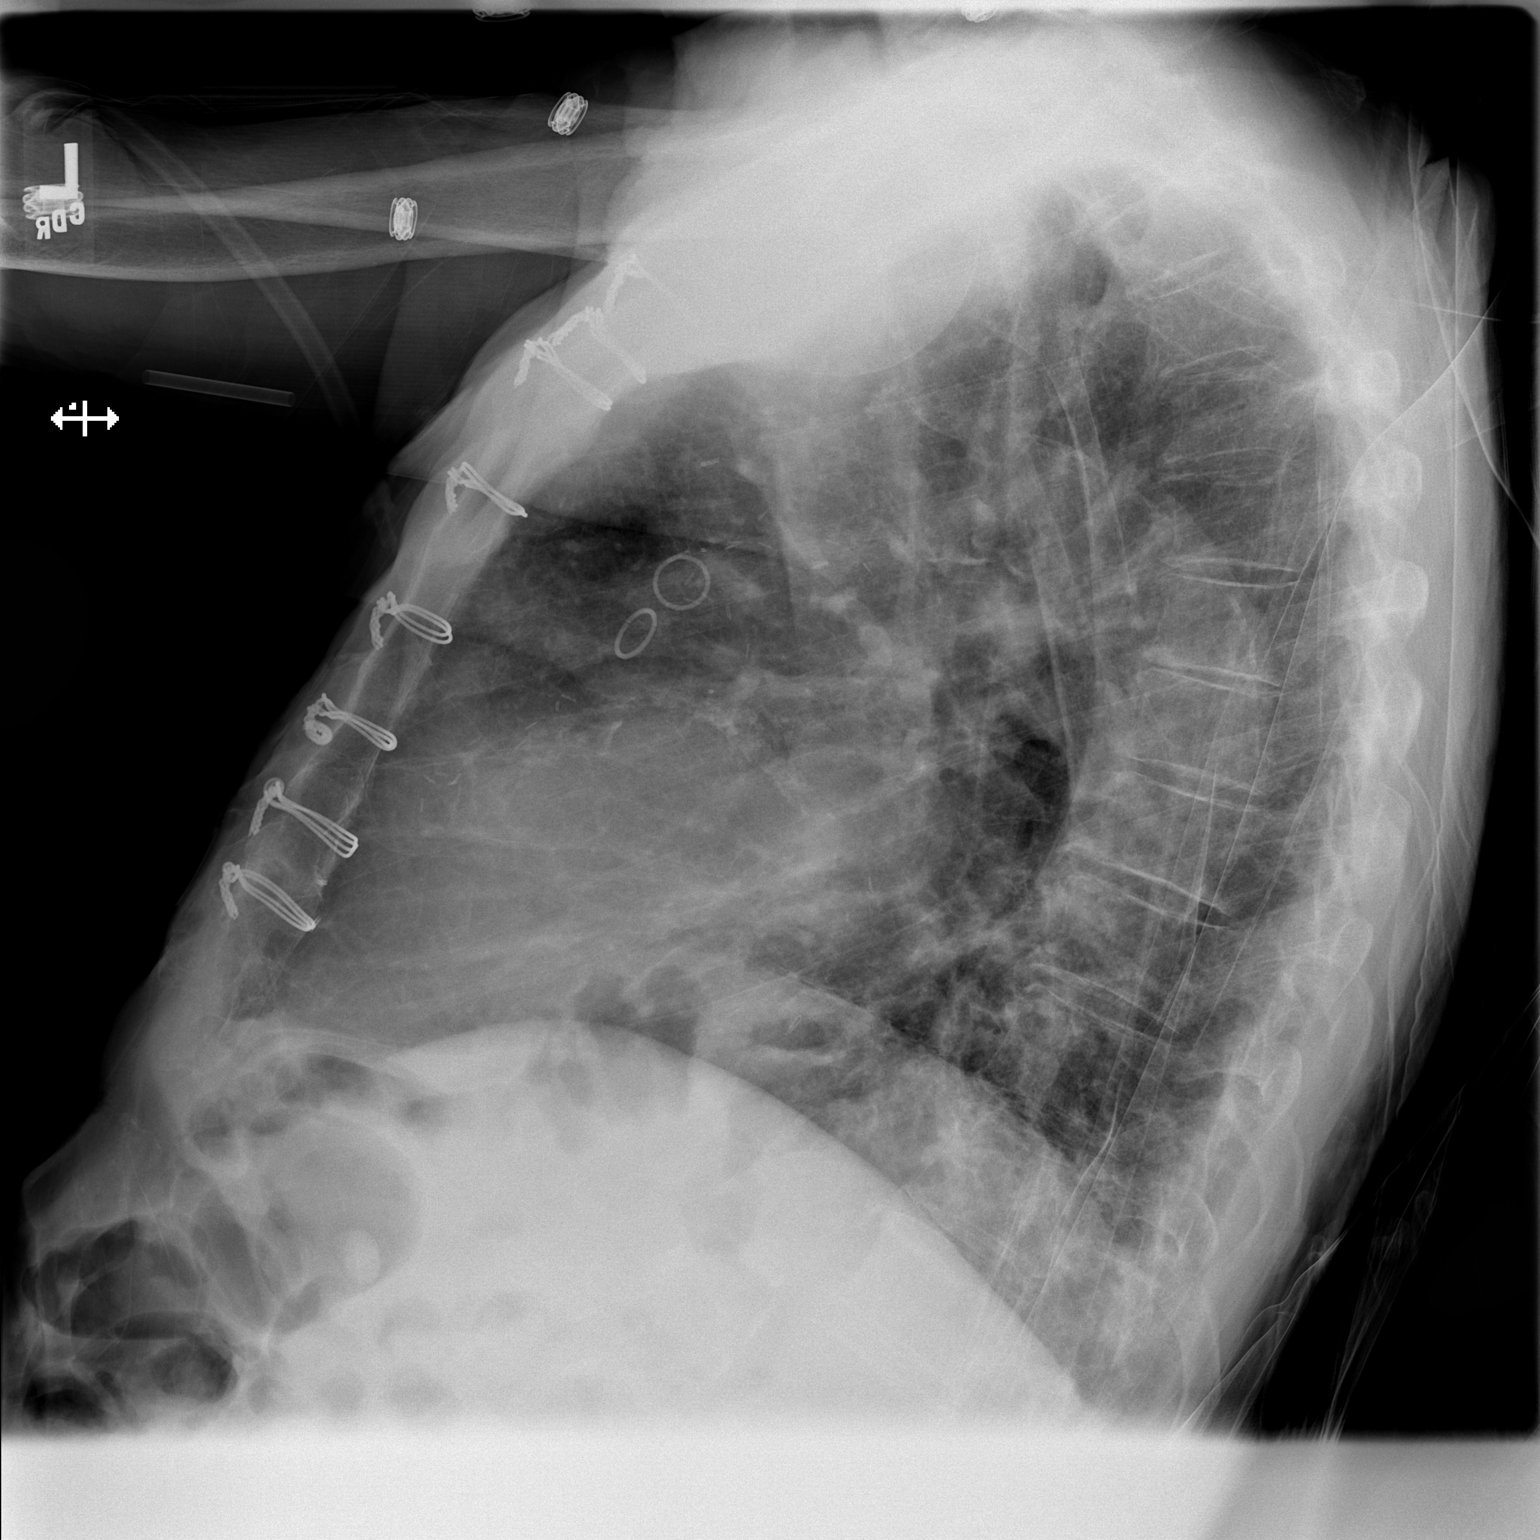

[x chest ap]
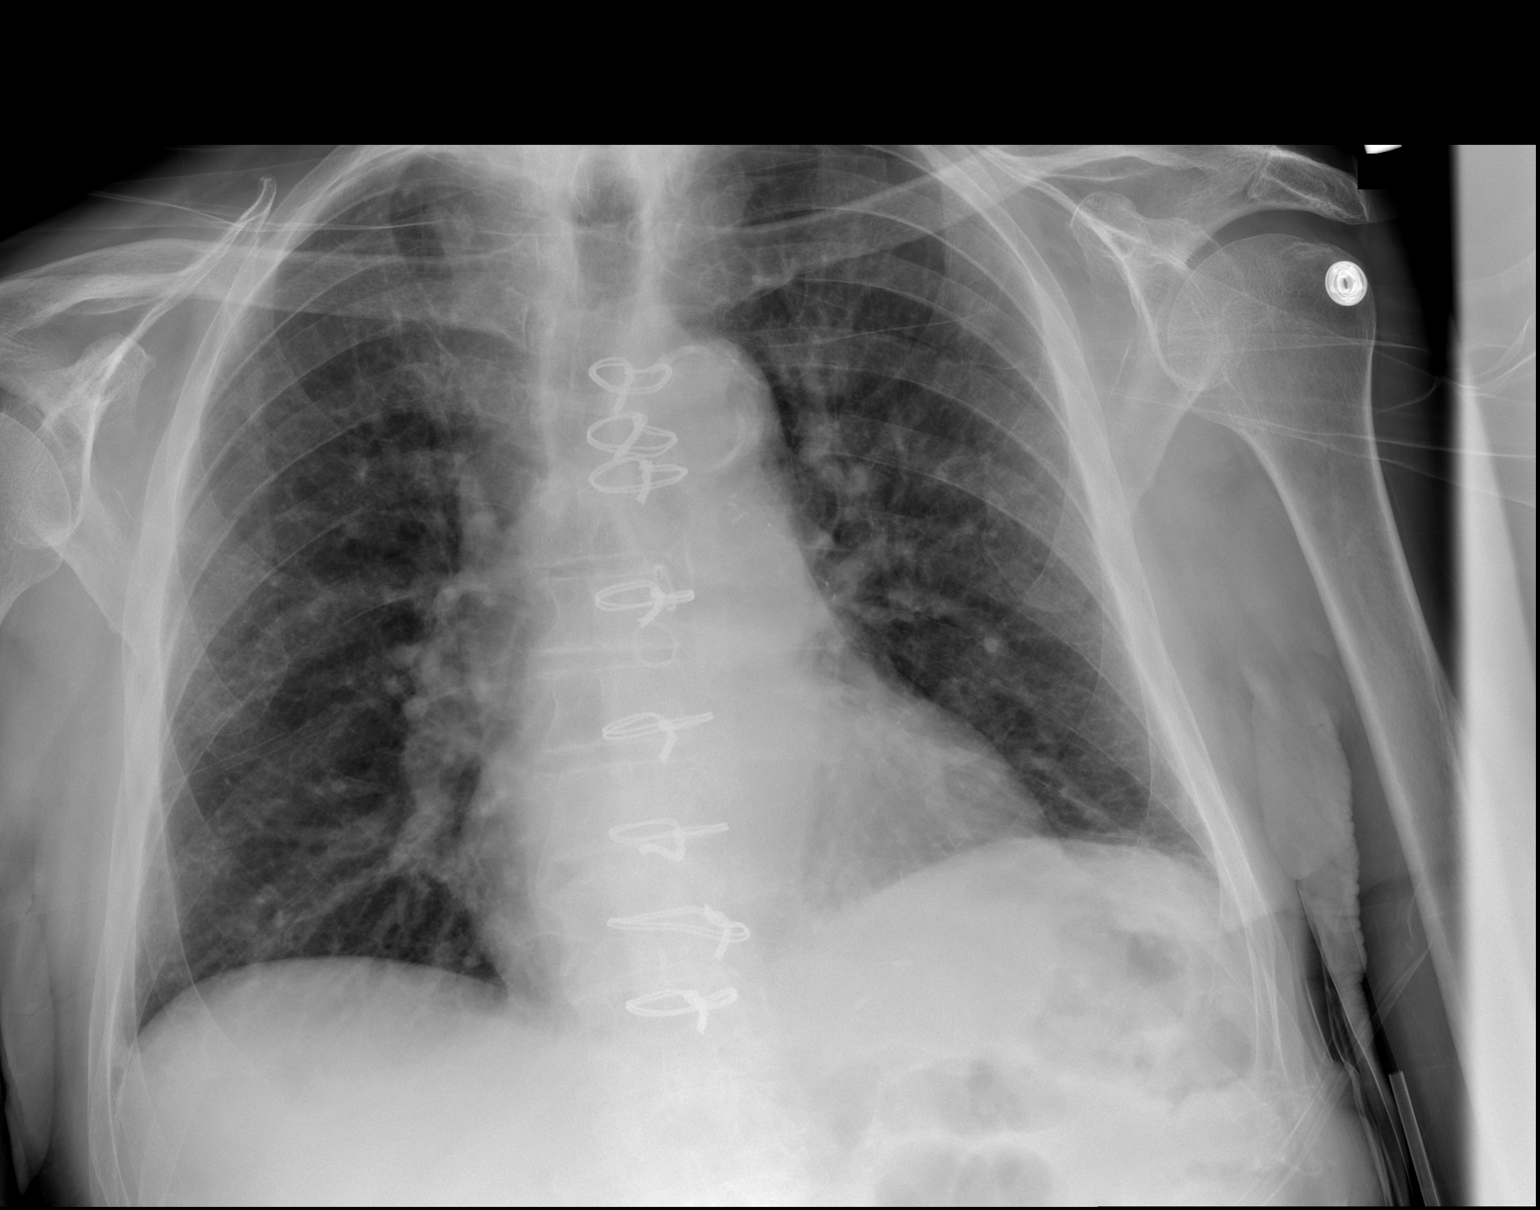

[2 of 2 positions shown; findings below may reference images not displayed]

FINDINGS: Borderline heart size for projection.  Pulmonary vascular
congestion is present.  Basilar atelectasis.  Interstitial
thickening is present at the lung bases.  Constellation of findings
are compatible with mild CHF with interstitial pulmonary edema.
Kerley B lines are present at the periphery of the bases.  Atypical
infection considered unlikely.  The median sternotomy / CABG.
Aortic arch atherosclerosis. No focal consolidation.  No effusion.
IMPRESSION: Constellation of findings compatible with mild CHF with
interstitial edema.
